# Patient Record
Sex: Male | Born: 1955 | Race: Black or African American | Hispanic: No | Marital: Married | Smoking: Current every day smoker
Health system: Southern US, Community
[De-identification: ages and names within clinical notes are randomized; demographics above are authoritative.]

## PROBLEM LIST (undated history)

## (undated) DIAGNOSIS — B1920 Unspecified viral hepatitis C without hepatic coma: Secondary | ICD-10-CM

## (undated) DIAGNOSIS — T6701XA Heatstroke and sunstroke, initial encounter: Secondary | ICD-10-CM

## (undated) DIAGNOSIS — I639 Cerebral infarction, unspecified: Secondary | ICD-10-CM

## (undated) HISTORY — DX: Unspecified viral hepatitis C without hepatic coma: B19.20

## (undated) HISTORY — PX: OTHER SURGICAL HISTORY: SHX169

---

## 1998-04-13 ENCOUNTER — Emergency Department (HOSPITAL_COMMUNITY): Admission: EM | Admit: 1998-04-13 | Discharge: 1998-04-13 | Payer: Self-pay | Admitting: Emergency Medicine

## 1998-04-13 ENCOUNTER — Encounter: Payer: Self-pay | Admitting: Emergency Medicine

## 1999-01-29 ENCOUNTER — Emergency Department (HOSPITAL_COMMUNITY): Admission: EM | Admit: 1999-01-29 | Discharge: 1999-01-29 | Payer: Self-pay | Admitting: *Deleted

## 2004-08-24 ENCOUNTER — Ambulatory Visit: Payer: Self-pay | Admitting: Family Medicine

## 2006-01-28 ENCOUNTER — Ambulatory Visit: Payer: Self-pay | Admitting: Family Medicine

## 2006-07-13 ENCOUNTER — Ambulatory Visit: Payer: Self-pay | Admitting: Family Medicine

## 2006-07-13 LAB — CONVERTED CEMR LAB
ALT: 68 units/L — ABNORMAL HIGH (ref 0–40)
AST: 62 units/L — ABNORMAL HIGH (ref 0–37)
Albumin: 3.7 g/dL (ref 3.5–5.2)
Alkaline Phosphatase: 74 units/L (ref 39–117)
Bilirubin, Direct: 0.2 mg/dL (ref 0.0–0.3)
HCV Ab: POSITIVE — AB
Hep B Core Total Ab: POSITIVE — AB
Hep B S Ab: NEGATIVE
Hepatitis B Surface Ag: NEGATIVE
Total Bilirubin: 0.8 mg/dL (ref 0.3–1.2)
Total Protein: 8.6 g/dL — ABNORMAL HIGH (ref 6.0–8.3)

## 2006-09-29 ENCOUNTER — Ambulatory Visit: Payer: Self-pay | Admitting: Gastroenterology

## 2007-04-13 ENCOUNTER — Ambulatory Visit: Payer: Self-pay | Admitting: Gastroenterology

## 2007-04-28 ENCOUNTER — Ambulatory Visit: Payer: Self-pay | Admitting: Family Medicine

## 2007-04-28 DIAGNOSIS — Z9189 Other specified personal risk factors, not elsewhere classified: Secondary | ICD-10-CM

## 2007-04-28 DIAGNOSIS — B353 Tinea pedis: Secondary | ICD-10-CM

## 2007-11-12 ENCOUNTER — Telehealth: Payer: Self-pay | Admitting: Internal Medicine

## 2007-11-13 ENCOUNTER — Ambulatory Visit: Payer: Self-pay | Admitting: Internal Medicine

## 2007-11-13 DIAGNOSIS — L03119 Cellulitis of unspecified part of limb: Secondary | ICD-10-CM

## 2007-11-13 DIAGNOSIS — L02619 Cutaneous abscess of unspecified foot: Secondary | ICD-10-CM

## 2007-11-24 ENCOUNTER — Ambulatory Visit: Payer: Self-pay | Admitting: Family Medicine

## 2007-11-30 ENCOUNTER — Telehealth: Payer: Self-pay | Admitting: Internal Medicine

## 2007-12-01 ENCOUNTER — Ambulatory Visit: Payer: Self-pay | Admitting: Family Medicine

## 2007-12-06 ENCOUNTER — Telehealth (INDEPENDENT_AMBULATORY_CARE_PROVIDER_SITE_OTHER): Payer: Self-pay | Admitting: *Deleted

## 2008-07-25 ENCOUNTER — Ambulatory Visit: Payer: Self-pay | Admitting: Family Medicine

## 2008-07-26 ENCOUNTER — Telehealth: Payer: Self-pay | Admitting: Family Medicine

## 2008-07-28 ENCOUNTER — Ambulatory Visit: Payer: Self-pay | Admitting: Internal Medicine

## 2008-08-28 ENCOUNTER — Ambulatory Visit: Payer: Self-pay | Admitting: Gastroenterology

## 2008-08-28 HISTORY — PX: OTHER SURGICAL HISTORY: SHX169

## 2008-08-28 LAB — HM COLONOSCOPY

## 2010-06-16 ENCOUNTER — Encounter: Payer: Self-pay | Admitting: Gastroenterology

## 2010-06-23 LAB — CONVERTED CEMR LAB
ALT: 81 units/L — ABNORMAL HIGH (ref 0–53)
AST: 62 units/L — ABNORMAL HIGH (ref 0–37)
Albumin: 3.9 g/dL (ref 3.5–5.2)
Alkaline Phosphatase: 59 units/L (ref 39–117)
BUN: 15 mg/dL (ref 6–23)
Basophils Absolute: 0.1 10*3/uL (ref 0.0–0.1)
Basophils Relative: 0.8 % (ref 0.0–3.0)
Bilirubin, Direct: 0.2 mg/dL (ref 0.0–0.3)
Blood in Urine, dipstick: NEGATIVE
CO2: 28 meq/L (ref 19–32)
Calcium: 9.3 mg/dL (ref 8.4–10.5)
Chloride: 104 meq/L (ref 96–112)
Cholesterol: 142 mg/dL (ref 0–200)
Creatinine, Ser: 1.2 mg/dL (ref 0.4–1.5)
Eosinophils Absolute: 0.1 10*3/uL (ref 0.0–0.7)
Eosinophils Relative: 1.2 % (ref 0.0–5.0)
GFR calc Af Amer: 82 mL/min
GFR calc non Af Amer: 68 mL/min
Glucose, Bld: 106 mg/dL — ABNORMAL HIGH (ref 70–99)
Glucose, Urine, Semiquant: NEGATIVE
HCT: 41.1 % (ref 39.0–52.0)
HDL: 47.6 mg/dL (ref >39.0)
Hemoglobin: 14.3 g/dL (ref 13.0–17.0)
Ketones, urine, test strip: NEGATIVE
LDL Cholesterol: 59 mg/dL (ref 0–99)
Lymphocytes Relative: 54.4 % — ABNORMAL HIGH (ref 12.0–46.0)
MCHC: 34.9 g/dL (ref 30.0–36.0)
MCV: 100.9 fL — ABNORMAL HIGH (ref 78.0–100.0)
Monocytes Absolute: 0.7 10*3/uL (ref 0.1–1.0)
Monocytes Relative: 7.6 % (ref 3.0–12.0)
Neutro Abs: 3.2 10*3/uL (ref 1.4–7.7)
Neutrophils Relative %: 36 % — ABNORMAL LOW (ref 43.0–77.0)
Nitrite: NEGATIVE
PSA: 0.15 ng/mL (ref 0.10–4.00)
Platelets: 180 10*3/uL (ref 150–400)
Potassium: 4.1 meq/L (ref 3.5–5.1)
RBC: 4.07 M/uL — ABNORMAL LOW (ref 4.22–5.81)
RDW: 11.6 % (ref 11.5–14.6)
Sodium: 139 meq/L (ref 135–145)
Specific Gravity, Urine: 1.025
TSH: 1.86 microintl units/mL (ref 0.35–5.50)
Total Bilirubin: 0.9 mg/dL (ref 0.3–1.2)
Total CHOL/HDL Ratio: 3
Total Protein: 8.6 g/dL — ABNORMAL HIGH (ref 6.0–8.3)
Triglycerides: 176 mg/dL — ABNORMAL HIGH (ref 0–149)
Urobilinogen, UA: 0.2
VLDL: 35 mg/dL (ref 0–40)
WBC Urine, dipstick: NEGATIVE
WBC: 8.8 10*3/uL (ref 4.5–10.5)
pH: 5.5

## 2010-08-12 ENCOUNTER — Encounter: Payer: Self-pay | Admitting: Family Medicine

## 2010-08-13 ENCOUNTER — Encounter: Payer: Self-pay | Admitting: Family Medicine

## 2010-08-27 ENCOUNTER — Other Ambulatory Visit: Payer: Self-pay | Admitting: Family Medicine

## 2010-08-27 ENCOUNTER — Other Ambulatory Visit: Payer: Self-pay

## 2010-08-27 ENCOUNTER — Other Ambulatory Visit (INDEPENDENT_AMBULATORY_CARE_PROVIDER_SITE_OTHER): Payer: Self-pay | Admitting: Family Medicine

## 2010-08-27 DIAGNOSIS — Z0000 Encounter for general adult medical examination without abnormal findings: Secondary | ICD-10-CM

## 2010-08-27 LAB — POCT URINALYSIS DIPSTICK
Leukocytes, UA: NEGATIVE
Nitrite, UA: NEGATIVE
pH, UA: 5

## 2010-08-28 LAB — LIPID PANEL
Cholesterol: 134 mg/dL (ref 0–200)
Total CHOL/HDL Ratio: 3
VLDL: 69.8 mg/dL — ABNORMAL HIGH (ref 0.0–40.0)

## 2010-08-28 LAB — CBC WITH DIFFERENTIAL/PLATELET
Basophils Absolute: 0 10*3/uL (ref 0.0–0.1)
Basophils Relative: 0 % (ref 0–1)
Lymphocytes Relative: 60 % — ABNORMAL HIGH (ref 12–46)
MCHC: 32.3 g/dL (ref 30.0–36.0)
Neutro Abs: 2.9 10*3/uL (ref 1.7–7.7)
Neutrophils Relative %: 30 % — ABNORMAL LOW (ref 43–77)
Platelets: 178 10*3/uL (ref 150–400)
RDW: 13.4 % (ref 11.5–15.5)
WBC: 9.7 10*3/uL (ref 4.0–10.5)

## 2010-08-28 LAB — BASIC METABOLIC PANEL
BUN: 20 mg/dL (ref 6–23)
CO2: 24 mEq/L (ref 19–32)
Calcium: 9.2 mg/dL (ref 8.4–10.5)
Chloride: 106 mEq/L (ref 96–112)
Creatinine, Ser: 1.4 mg/dL (ref 0.4–1.5)
GFR: 70.28 mL/min (ref >60.00)
Glucose, Bld: 99 mg/dL (ref 70–99)
Potassium: 4.6 mEq/L (ref 3.5–5.1)
Sodium: 141 mEq/L (ref 135–145)

## 2010-08-28 LAB — HEPATIC FUNCTION PANEL
ALT: 62 U/L — ABNORMAL HIGH (ref 0–53)
AST: 70 U/L — ABNORMAL HIGH (ref 0–37)
Albumin: 3.9 g/dL (ref 3.5–5.2)
Alkaline Phosphatase: 49 U/L (ref 39–117)
Total Bilirubin: 0.7 mg/dL (ref 0.3–1.2)

## 2010-08-28 LAB — PSA: PSA: 0.11 ng/mL (ref 0.10–4.00)

## 2010-08-28 LAB — LDL CHOLESTEROL, DIRECT: Direct LDL: 44.6 mg/dL

## 2010-08-29 ENCOUNTER — Telehealth: Payer: Self-pay | Admitting: *Deleted

## 2010-08-29 NOTE — Telephone Encounter (Signed)
Pt's wife is calling for lab results.  Husband is concerned.

## 2010-09-02 ENCOUNTER — Telehealth: Payer: Self-pay

## 2010-09-02 ENCOUNTER — Ambulatory Visit (INDEPENDENT_AMBULATORY_CARE_PROVIDER_SITE_OTHER): Payer: Self-pay | Admitting: Family Medicine

## 2010-09-02 ENCOUNTER — Encounter: Payer: Self-pay | Admitting: Family Medicine

## 2010-09-02 VITALS — BP 120/82 | HR 103 | Ht 70.0 in | Wt 193.0 lb

## 2010-09-02 DIAGNOSIS — Z136 Encounter for screening for cardiovascular disorders: Secondary | ICD-10-CM

## 2010-09-02 DIAGNOSIS — Z0000 Encounter for general adult medical examination without abnormal findings: Secondary | ICD-10-CM

## 2010-09-02 NOTE — Progress Notes (Signed)
  Subjective:    Patient ID: Ryan Montgomery, male    DOB: 05-02-1956, 55 y.o.   MRN: [id number]  HPI 54 yr old male for a cpx. He feels fine with no complaints.    Review of Systems  Constitutional: Negative.   HENT: Negative.   Eyes: Negative.   Respiratory: Negative.   Cardiovascular: Negative.   Gastrointestinal: Negative.   Genitourinary: Negative.   Musculoskeletal: Negative.   Skin: Negative.   Neurological: Negative.   Hematological: Negative.   Psychiatric/Behavioral: Negative.        Objective:   Physical Exam  Constitutional: He is oriented to person, place, and time. He appears well-developed and well-nourished. No distress.  HENT:  Head: Normocephalic and atraumatic.  Right Ear: External ear normal.  Left Ear: External ear normal.  Nose: Nose normal.  Mouth/Throat: Oropharynx is clear and moist. No oropharyngeal exudate.  Eyes: Conjunctivae and EOM are normal. Pupils are equal, round, and reactive to light. Right eye exhibits no discharge. Left eye exhibits no discharge. No scleral icterus.  Neck: Neck supple. No JVD present. No tracheal deviation present. No thyromegaly present.  Cardiovascular: Normal rate, regular rhythm, normal heart sounds and intact distal pulses.  Exam reveals no gallop and no friction rub.   No murmur heard. Pulmonary/Chest: Effort normal and breath sounds normal. No respiratory distress. He has no wheezes. He has no rales. He exhibits no tenderness.  Abdominal: Soft. Bowel sounds are normal. He exhibits no distension and no mass. There is no tenderness. There is no rebound and no guarding.  Genitourinary: Rectum normal, prostate normal and penis normal. Guaiac negative stool. No penile tenderness.  Musculoskeletal: Normal range of motion. He exhibits no edema and no tenderness.  Lymphadenopathy:    He has no cervical adenopathy.  Neurological: He is alert and oriented to person, place, and time. He has normal reflexes. No cranial nerve  deficit. He exhibits normal muscle tone. Coordination normal.  Skin: Skin is warm and dry. No rash noted. He is not diaphoretic. No erythema. No pallor.  Psychiatric: He has a normal mood and affect. His behavior is normal. Judgment and thought content normal.          Assessment & Plan:  We discussed reducing the fats in his diet. Suggested he take fish oil capsules daily.

## 2010-09-02 NOTE — Telephone Encounter (Signed)
We went over these today at his cpx

## 2010-09-02 NOTE — Telephone Encounter (Signed)
Pt here for appt with Dr Clent Ridges for his CPX. Copy of labs to pt.

## 2010-09-02 NOTE — Telephone Encounter (Signed)
Message copied by Madison Hickman on Mon Sep 02, 2010 11:12 AM ------      Message from: Dwaine Deter      Created: Thu Aug 29, 2010  1:22 PM       Normal except high TG. Watch the diet

## 2010-10-08 NOTE — Assessment & Plan Note (Signed)
[facility]                                 ON-CALL NOTE   Ryan Montgomery, Ryan Montgomery                      MRN:          [id number]  DATE:11/19/2007                            DOB:          11/16/1955    PRIMARY CARE PHYSICIAN:  Tera Mater. Clent Ridges, MD   OBJECTIVE:  The patient was [facility], was given  Avelox for a presumed cellulitis on his foot between his toes, got  through reading the insert and saw that it can cause rash.  He developed  a mild rash on Tuesday.  He kept taking the medication, and by today now  has, what he thinks, are hives.  PI also said to avoid sunlight.  I  tried to explain to him that he needs to stop his medication at this  point, take antihistamine and drink lots of fluids and the hives will  probably resolve and told him to seek appointment on Monday to follow  up.  I then spoke with the wife and she said that the infection of the  feet looks like it has basically resolved, and therefore, probably not  really important that he needs to be seen real quickly, but I would  still followup just to see whether if it is true that he really did have  a rash to the medication.  Again, I told him to go ahead and take  antihistamine.  He has Zyrtec at home,.  Drink lots of fluids and the  hives should slowly resolve.  Primary care Blakelee Allington is Dr. Clent Ridges and home  office [place].      Arta Silence, MD  Electronically Signed     Arta Silence, MD  Electronically Signed   RNS/MedQ  DD: 11/19/2007  DT: 11/20/2007  Job #: [id number]

## 2010-10-11 NOTE — Assessment & Plan Note (Signed)
[facility] OFFICE NOTE   LAMORRIS, KNOBLOCK                      MRN:          [id number]  DATE:01/28/2006                            DOB:          06-29-1955    This is a 55 year old gentleman here for a complete physical examination.  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  He feels fine  and has no complaints at all.  We have not been following him for any  particular chronic conditions either.  For details of his past medical  history, family history, social history, etcetera refer to the last physical  note dated January 04, 2004.   ALLERGIES:  None.   MEDICATIONS:  None.   OBJECTIVE:  VITAL SIGNS:  Height 6 foot, 0 inches; weight 188, blood  pressure 120/80, pulse 70 and regular.  GENERAL:  He appears to be healthy.  SKIN:  Free of significant lesions.  HEENT:  Eyes:  Clear.  Vision, uncorrected, is 20/20 on the right and 20/20  on the left.  Hearing:  Grossly within normal limits.  Ears are clear.  Oropharynx is  clear.  NECK:  Supple without lymphadenopathy or masses.  LUNGS:  Clear.  CARDIAC:  Rate and rhythm are regular without gallops, murmurs or rubs.  Distal pulses are full.  ABDOMEN:  Soft, normal bowel sounds, nontender, no masses.  GENITALIA:  Normal male.  RECTAL:  No masses or tenderness. Prostate is within normal limits.  Stool  hemoccult negative.  EXTREMITIES:  No clubbing, cyanosis or edema.  NEUROLOGIC:  Grossly intact.   The urinalysis today is clear.   ASSESSMENT AND PLAN:  1. Complete physical.  He is fasting so I will send him for the usual      laboratories.  2. Department of Transportation exam.  He was passed without restrictions      for the next two years.                                   Tera Mater. Clent Ridges, MD   SAF/MedQ  DD:  01/28/2006  DT:  01/29/2006  Job #:  [id number]

## 2011-07-07 ENCOUNTER — Ambulatory Visit: Payer: Self-pay | Admitting: Family Medicine

## 2011-07-08 ENCOUNTER — Encounter: Payer: Self-pay | Admitting: Family Medicine

## 2011-07-08 ENCOUNTER — Ambulatory Visit (INDEPENDENT_AMBULATORY_CARE_PROVIDER_SITE_OTHER): Payer: Self-pay | Admitting: Family Medicine

## 2011-07-08 VITALS — BP 132/82 | HR 94 | Temp 99.2°F | Ht 70.0 in | Wt 190.0 lb

## 2011-07-08 DIAGNOSIS — Z0000 Encounter for general adult medical examination without abnormal findings: Secondary | ICD-10-CM

## 2011-07-08 LAB — CBC WITH DIFFERENTIAL/PLATELET
Basophils Relative: 0.5 % (ref 0.0–3.0)
Eosinophils Relative: 0.7 % (ref 0.0–5.0)
HCT: 39.2 % (ref 39.0–52.0)
Hemoglobin: 13.2 g/dL (ref 13.0–17.0)
Lymphocytes Relative: 60.9 % — ABNORMAL HIGH (ref 12.0–46.0)
Lymphs Abs: 5.3 10*3/uL — ABNORMAL HIGH (ref 0.7–4.0)
Monocytes Relative: 9.3 % (ref 3.0–12.0)
Neutro Abs: 2.5 10*3/uL (ref 1.4–7.7)
RBC: 3.81 Mil/uL — ABNORMAL LOW (ref 4.22–5.81)

## 2011-07-08 LAB — HEPATIC FUNCTION PANEL
ALT: 63 U/L — ABNORMAL HIGH (ref 0–53)
AST: 63 U/L — ABNORMAL HIGH (ref 0–37)
Bilirubin, Direct: 0.1 mg/dL (ref 0.0–0.3)
Total Bilirubin: 0.7 mg/dL (ref 0.3–1.2)

## 2011-07-08 LAB — POCT URINALYSIS DIPSTICK
Bilirubin, UA: NEGATIVE
Ketones, UA: NEGATIVE
Leukocytes, UA: NEGATIVE
Protein, UA: NEGATIVE
Spec Grav, UA: 1.025

## 2011-07-08 LAB — BASIC METABOLIC PANEL
CO2: 27 mEq/L (ref 19–32)
Calcium: 9.3 mg/dL (ref 8.4–10.5)
Chloride: 106 mEq/L (ref 96–112)
Creatinine, Ser: 1.1 mg/dL (ref 0.4–1.5)
Sodium: 139 mEq/L (ref 135–145)

## 2011-07-08 LAB — LIPID PANEL
Cholesterol: 116 mg/dL (ref 0–200)
HDL: 37.1 mg/dL — ABNORMAL LOW (ref >39.00)
Triglycerides: 327 mg/dL — ABNORMAL HIGH (ref 0.0–149.0)

## 2011-07-08 LAB — TSH: TSH: 1.92 u[IU]/mL (ref 0.35–5.50)

## 2011-07-08 LAB — PSA: PSA: 0.15 ng/mL (ref 0.10–4.00)

## 2011-07-08 NOTE — Progress Notes (Signed)
  Subjective:    Patient ID: Ryan Montgomery, male    DOB: [birth year], 56 y.o.   MRN: [id number]  HPI 56 yr old male for a cpx. He feels fine and has no concerns.    Review of Systems  Constitutional: Negative.   HENT: Negative.   Eyes: Negative.   Respiratory: Negative.   Cardiovascular: Negative.   Gastrointestinal: Negative.   Genitourinary: Negative.   Musculoskeletal: Negative.   Skin: Negative.   Neurological: Negative.   Hematological: Negative.   Psychiatric/Behavioral: Negative.        Objective:   Physical Exam  Constitutional: He is oriented to person, place, and time. He appears well-developed and well-nourished. No distress.  HENT:  Head: Normocephalic and atraumatic.  Right Ear: External ear normal.  Left Ear: External ear normal.  Nose: Nose normal.  Mouth/Throat: Oropharynx is clear and moist. No oropharyngeal exudate.  Eyes: Conjunctivae and EOM are normal. Pupils are equal, round, and reactive to light. Right eye exhibits no discharge. Left eye exhibits no discharge. No scleral icterus.  Neck: Neck supple. No JVD present. No tracheal deviation present. No thyromegaly present.  Cardiovascular: Normal rate, regular rhythm, normal heart sounds and intact distal pulses.  Exam reveals no gallop and no friction rub.   No murmur heard.      EKG normal   Pulmonary/Chest: Effort normal and breath sounds normal. No respiratory distress. He has no wheezes. He has no rales. He exhibits no tenderness.  Abdominal: Soft. Bowel sounds are normal. He exhibits no distension and no mass. There is no tenderness. There is no rebound and no guarding.  Genitourinary: Rectum normal, prostate normal and penis normal. Guaiac negative stool. No penile tenderness.  Musculoskeletal: Normal range of motion. He exhibits no edema and no tenderness.  Lymphadenopathy:    He has no cervical adenopathy.  Neurological: He is alert and oriented to person, place, and time. He has normal reflexes.  No cranial nerve deficit. He exhibits normal muscle tone. Coordination normal.  Skin: Skin is warm and dry. No rash noted. He is not diaphoretic. No erythema. No pallor.  Psychiatric: He has a normal mood and affect. His behavior is normal. Judgment and thought content normal.          Assessment & Plan:  Well exam. Get fasting labs today

## 2011-07-09 ENCOUNTER — Encounter: Payer: Self-pay | Admitting: Family Medicine

## 2011-07-09 NOTE — Progress Notes (Signed)
Quick Note:  Left voice message and put a copy of results in mail. ______ 

## 2012-06-04 ENCOUNTER — Ambulatory Visit: Payer: Self-pay | Admitting: Emergency Medicine

## 2012-06-04 VITALS — BP 130/80 | HR 90 | Temp 98.0°F | Resp 16 | Ht 71.0 in | Wt 180.8 lb

## 2012-06-04 DIAGNOSIS — Z0000 Encounter for general adult medical examination without abnormal findings: Secondary | ICD-10-CM

## 2012-06-04 NOTE — Progress Notes (Signed)
  Subjective:    Patient ID: Ryan Montgomery, male    DOB: 1955/12/12, 57 y.o.   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 He did check the box regular alcohol use and states he drinks 2 beers per night during the week and 2 mixed drinks at night on the weekends. He has a DUI back in the 80s but none since that time and has never been in a program for alcohol misuse.    Review of Systems     Objective:   Physical Exam HEENT exam is unremarkable neck supple chest clear heart regular leg no murmurs rubs or gallops. Abdomen is soft no tenderness extremities without edema genitourinary reveals no abnormalities       Assessment & Plan:  Patient has regular checkups with his PCP Dr. Clent Ridges. According to the record he has annual exam in the summer was not diagnosed with any alcohol-related issues. He is issued a 2 year card.

## 2012-06-17 ENCOUNTER — Ambulatory Visit (INDEPENDENT_AMBULATORY_CARE_PROVIDER_SITE_OTHER): Payer: Self-pay | Admitting: Family Medicine

## 2012-06-17 ENCOUNTER — Encounter: Payer: Self-pay | Admitting: Family Medicine

## 2012-06-17 VITALS — BP 120/70 | HR 84 | Temp 99.9°F | Wt 191.0 lb

## 2012-06-17 DIAGNOSIS — J069 Acute upper respiratory infection, unspecified: Secondary | ICD-10-CM

## 2012-06-17 MED ORDER — HYDROCODONE-HOMATROPINE 5-1.5 MG/5ML PO SYRP: 5.0000 mL | ORAL_SOLUTION | Freq: Four times a day (QID) | ORAL | Status: AC | PRN

## 2012-06-17 NOTE — Progress Notes (Signed)
  Subjective:    Patient ID: Ryan Montgomery, male    DOB: 1955-10-12, 57 y.o.   MRN: [id number]  HPI Acute visit Onset yesterday of fever, chills, cough, aches,sore throat, headache. No nausea or vomiting.  Used Mucinex and Triaminic-without much relief. No skin rash. No sick contacts. No chronic illness. Takes no regular medications. Allergy to moxifloxacin Does smoke about one pack cigarettes per day   Review of Systems  Constitutional: Positive for fever, chills and fatigue.  HENT: Positive for congestion and sore throat.   Respiratory: Positive for cough.   Cardiovascular: Negative for chest pain.  Neurological: Positive for headaches.       Objective:   Physical Exam  Constitutional: He appears well-developed and well-nourished.  HENT:  Right Ear: External ear normal.  Left Ear: External ear normal.  Mouth/Throat: Oropharynx is clear and moist.  Neck: Neck supple.  Cardiovascular: Normal rate and regular rhythm.   Pulmonary/Chest: Effort normal and breath sounds normal. No respiratory distress. He has no wheezes. He has no rales.  Lymphadenopathy:    He has no cervical adenopathy.          Assessment & Plan:  Viral URI with cough. Hycodan cough syrup for nighttime use as needed. Followup when necessary

## 2012-06-17 NOTE — Patient Instructions (Addendum)
Viral Infections  A viral infection can be caused by different types of viruses.Most viral infections are not serious and resolve on their own. However, some infections may cause severe symptoms and may lead to further complications.  SYMPTOMS  Viruses can frequently cause:   Minor sore throat.   Aches and pains.   Headaches.   Runny nose.   Different types of rashes.   Watery eyes.   Tiredness.   Cough.   Loss of appetite.   Gastrointestinal infections, resulting in nausea, vomiting, and diarrhea.  These symptoms do not respond to antibiotics because the infection is not caused by bacteria. However, you might catch a bacterial infection following the viral infection. This is sometimes called a "superinfection." Symptoms of such a bacterial infection may include:   Worsening sore throat with pus and difficulty swallowing.   Swollen neck glands.   Chills and a high or persistent fever.   Severe headache.   Tenderness over the sinuses.   Persistent overall ill feeling (malaise), muscle aches, and tiredness (fatigue).   Persistent cough.   Yellow, green, or brown mucus production with coughing.  HOME CARE INSTRUCTIONS    Only take over-the-counter or prescription medicines for pain, discomfort, diarrhea, or fever as directed by your caregiver.   Drink enough water and fluids to keep your urine clear or pale yellow. Sports drinks can provide valuable electrolytes, sugars, and hydration.   Get plenty of rest and maintain proper nutrition. Soups and broths with crackers or rice are fine.  SEEK IMMEDIATE MEDICAL CARE IF:    You have severe headaches, shortness of breath, chest pain, neck pain, or an unusual rash.   You have uncontrolled vomiting, diarrhea, or you are unable to keep down fluids.   You or your child has an oral temperature above 102 F (38.9 C), not controlled by medicine.   Your baby is older than 3 months with a rectal temperature of 102 F (38.9 C) or higher.   Your baby is 3  months old or younger with a rectal temperature of 100.4 F (38 C) or higher.  MAKE SURE YOU:    Understand these instructions.   Will watch your condition.   Will get help right away if you are not doing well or get worse.  Document Released: 02/19/2005 Document Revised: 08/04/2011 Document Reviewed: 09/16/2010  ExitCare Patient Information 2013 [employer].

## 2012-10-09 ENCOUNTER — Encounter: Payer: Self-pay | Admitting: Emergency Medicine

## 2012-12-23 ENCOUNTER — Other Ambulatory Visit: Payer: Self-pay

## 2012-12-30 ENCOUNTER — Encounter: Payer: Self-pay | Admitting: Family Medicine

## 2013-01-27 ENCOUNTER — Other Ambulatory Visit: Payer: Self-pay

## 2013-01-31 ENCOUNTER — Encounter: Payer: Self-pay | Admitting: Family Medicine

## 2013-07-11 ENCOUNTER — Other Ambulatory Visit: Payer: Self-pay

## 2013-07-18 ENCOUNTER — Encounter: Payer: Self-pay | Admitting: Family Medicine

## 2013-07-18 ENCOUNTER — Ambulatory Visit (INDEPENDENT_AMBULATORY_CARE_PROVIDER_SITE_OTHER): Payer: No Typology Code available for payment source | Admitting: Family Medicine

## 2013-07-18 VITALS — BP 140/82 | HR 101 | Temp 98.5°F | Ht 70.25 in | Wt 180.0 lb

## 2013-07-18 DIAGNOSIS — Z0000 Encounter for general adult medical examination without abnormal findings: Secondary | ICD-10-CM

## 2013-07-18 DIAGNOSIS — B1920 Unspecified viral hepatitis C without hepatic coma: Secondary | ICD-10-CM | POA: Insufficient documentation

## 2013-07-18 LAB — LIPID PANEL
Cholesterol: 120 mg/dL (ref 0–200)
HDL: 47.7 mg/dL (ref >39.00)
LDL Cholesterol: 40 mg/dL (ref 0–99)
TRIGLYCERIDES: 163 mg/dL — AB (ref 0.0–149.0)
Total CHOL/HDL Ratio: 3
VLDL: 32.6 mg/dL (ref 0.0–40.0)

## 2013-07-18 LAB — CBC WITH DIFFERENTIAL/PLATELET
Basophils Absolute: 0 10*3/uL (ref 0.0–0.1)
Basophils Relative: 0.6 % (ref 0.0–3.0)
Eosinophils Absolute: 0.1 10*3/uL (ref 0.0–0.7)
Eosinophils Relative: 0.7 % (ref 0.0–5.0)
HCT: 37.8 % — ABNORMAL LOW (ref 39.0–52.0)
Hemoglobin: 12.3 g/dL — ABNORMAL LOW (ref 13.0–17.0)
Lymphocytes Relative: 51.6 % — ABNORMAL HIGH (ref 12.0–46.0)
Lymphs Abs: 4.4 10*3/uL — ABNORMAL HIGH (ref 0.7–4.0)
MCHC: 32.5 g/dL (ref 30.0–36.0)
MCV: 107.2 fl — ABNORMAL HIGH (ref 78.0–100.0)
MONO ABS: 0.7 10*3/uL (ref 0.1–1.0)
Monocytes Relative: 8.2 % (ref 3.0–12.0)
Neutro Abs: 3.3 10*3/uL (ref 1.4–7.7)
Neutrophils Relative %: 38.9 % — ABNORMAL LOW (ref 43.0–77.0)
PLATELETS: 140 10*3/uL — AB (ref 150.0–400.0)
RBC: 3.52 Mil/uL — ABNORMAL LOW (ref 4.22–5.81)
RDW: 14.2 % (ref 11.5–14.6)
WBC: 8.6 10*3/uL (ref 4.5–10.5)

## 2013-07-18 LAB — HEPATIC FUNCTION PANEL
ALT: 106 U/L — AB (ref 0–53)
AST: 169 U/L — ABNORMAL HIGH (ref 0–37)
Albumin: 3.4 g/dL — ABNORMAL LOW (ref 3.5–5.2)
Alkaline Phosphatase: 53 U/L (ref 39–117)
BILIRUBIN DIRECT: 0.1 mg/dL (ref 0.0–0.3)
Total Bilirubin: 0.7 mg/dL (ref 0.3–1.2)
Total Protein: 8.3 g/dL (ref 6.0–8.3)

## 2013-07-18 LAB — BASIC METABOLIC PANEL
BUN: 11 mg/dL (ref 6–23)
CO2: 27 mEq/L (ref 19–32)
Calcium: 9.4 mg/dL (ref 8.4–10.5)
Chloride: 109 mEq/L (ref 96–112)
Creatinine, Ser: 1 mg/dL (ref 0.4–1.5)
GFR: 100.31 mL/min (ref >60.00)
Glucose, Bld: 102 mg/dL — ABNORMAL HIGH (ref 70–99)
Potassium: 4.9 mEq/L (ref 3.5–5.1)
SODIUM: 143 meq/L (ref 135–145)

## 2013-07-18 LAB — POCT URINALYSIS DIPSTICK
Bilirubin, UA: NEGATIVE
Glucose, UA: NEGATIVE
Ketones, UA: NEGATIVE
Leukocytes, UA: NEGATIVE
Nitrite, UA: NEGATIVE
RBC UA: NEGATIVE
Spec Grav, UA: 1.025
UROBILINOGEN UA: 0.2
pH, UA: 5.5

## 2013-07-18 MED ORDER — DICLOFENAC SODIUM 75 MG PO TBEC: 75.0000 mg | DELAYED_RELEASE_TABLET | Freq: Two times a day (BID) | ORAL | Status: DC | PRN

## 2013-07-18 NOTE — Progress Notes (Signed)
   Subjective:    Patient ID: Ryan NewcomerMaurice Macdonell, male    DOB: 07/25/1955, 58 y.o.   MRN: [id number]  HPI 58 yr old male for a cpx. He feels well except for some pain in the right wrist that started one year ago. This bothers him when he has to do heavy lifting or pushing heavy equipment on his job. He wears a wrist brace at times and this helps. He has not [facility] years now.    Review of Systems  Constitutional: Negative.   HENT: Negative.   Eyes: Negative.   Respiratory: Negative.   Cardiovascular: Negative.   Gastrointestinal: Negative.   Genitourinary: Negative.   Musculoskeletal: Negative.   Skin: Negative.   Neurological: Negative.   Psychiatric/Behavioral: Negative.        Objective:   Physical Exam  Constitutional: He is oriented to person, place, and time. He appears well-developed and well-nourished. No distress.  HENT:  Head: Normocephalic and atraumatic.  Right Ear: External ear normal.  Left Ear: External ear normal.  Nose: Nose normal.  Mouth/Throat: Oropharynx is clear and moist. No oropharyngeal exudate.  Eyes: Conjunctivae and EOM are normal. Pupils are equal, round, and reactive to light. Right eye exhibits no discharge. Left eye exhibits no discharge. No scleral icterus.  Neck: Neck supple. No JVD present. No tracheal deviation present. No thyromegaly present.  Cardiovascular: Normal rate, regular rhythm, normal heart sounds and intact distal pulses.  Exam reveals no gallop and no friction rub.   No murmur heard. EKG normal   Pulmonary/Chest: Effort normal and breath sounds normal. No respiratory distress. He has no wheezes. He has no rales. He exhibits no tenderness.  Abdominal: Soft. Bowel sounds are normal. He exhibits no distension and no mass. There is no tenderness. There is no rebound and no guarding.  Genitourinary: Rectum normal, prostate normal and penis normal. Guaiac negative stool. No penile tenderness.    Musculoskeletal: Normal range of motion. He exhibits no edema and no tenderness.  Lymphadenopathy:    He has no cervical adenopathy.  Neurological: He is alert and oriented to person, place, and time. He has normal reflexes. No cranial nerve deficit. He exhibits normal muscle tone. Coordination normal.  Skin: Skin is warm and dry. No rash noted. He is not diaphoretic. No erythema. No pallor.  Psychiatric: He has a normal mood and affect. His behavior is normal. Judgment and thought content normal.          Assessment & Plan:  Well exam. Get fasting labs. He seems to have some tendonitis in the wrist, so he wil try Diclofenac prn. Refer to [facility] for the hepatitis C.

## 2013-07-18 NOTE — Progress Notes (Signed)
Pre visit review using our clinic review tool, if applicable. No additional management support is needed unless otherwise documented below in the visit note. 

## 2013-07-19 ENCOUNTER — Telehealth: Payer: Self-pay | Admitting: Family Medicine

## 2013-07-19 LAB — PSA: PSA: 0.19 ng/mL (ref 0.10–4.00)

## 2013-07-19 LAB — TSH: TSH: 2.86 u[IU]/mL (ref 0.35–5.50)

## 2013-07-19 NOTE — Telephone Encounter (Signed)
Relevant patient education mailed to patient.  

## 2013-08-01 ENCOUNTER — Telehealth: Payer: Self-pay | Admitting: Family Medicine

## 2013-08-01 NOTE — Telephone Encounter (Signed)
 GI gave name of md at [facility] that will see pt  [phone] Fax  [phone] Pt would like to get this going asap since it's been 2 weeks

## 2013-08-02 NOTE — Telephone Encounter (Addendum)
Pt aware this referral will be sent to [facility]. Ryan Montgomery will not do  either

## 2013-08-05 NOTE — Telephone Encounter (Signed)
pls advise on referral to [facility]! Pt following up b/c he needs appt asap.

## 2013-08-12 ENCOUNTER — Telehealth: Payer: Self-pay | Admitting: Family Medicine

## 2013-08-12 NOTE — Telephone Encounter (Signed)
No these specialty orders need to come from the GI office, not us

## 2013-08-12 NOTE — Telephone Encounter (Signed)
Pt hus has been referral to [facility] for hep C. [facility] is requesting genotype ,hcv rna quantitive , Can we sch lab?

## 2013-08-17 NOTE — Telephone Encounter (Signed)
I spoke with pt's wife. 

## 2013-08-22 ENCOUNTER — Ambulatory Visit: Payer: Self-pay | Admitting: Family Medicine

## 2013-08-22 ENCOUNTER — Telehealth: Payer: Self-pay | Admitting: Family Medicine

## 2013-08-22 VITALS — BP 144/84 | HR 104 | Temp 97.9°F | Resp 16 | Ht 70.0 in | Wt 181.6 lb

## 2013-08-22 DIAGNOSIS — Z0289 Encounter for other administrative examinations: Secondary | ICD-10-CM

## 2013-08-22 DIAGNOSIS — Z0000 Encounter for general adult medical examination without abnormal findings: Secondary | ICD-10-CM

## 2013-08-22 DIAGNOSIS — B182 Chronic viral hepatitis C: Secondary | ICD-10-CM

## 2013-08-22 NOTE — Progress Notes (Signed)
Patient ID: Ryan Montgomery MRN: [id number], DOB: 02-15-1956 58 y.o. Date of Encounter: 08/22/2013, 11:10 AM  Primary Physician: Nelwyn Salisbury, MD  Chief Complaint: Physical (CPE)  HPI: 58 y.o. y/o male with history noted below here for CPE.  Doing well. No issues/complaints.  Review of Systems: Consitutional: No fever, chills, fatigue, night sweats, lymphadenopathy, or weight changes. Eyes: No visual changes, eye redness, or discharge. ENT/Mouth: Ears: No otalgia, tinnitus, hearing loss, discharge. Nose: No congestion, rhinorrhea, sinus pain, or epistaxis. Throat: No sore throat, post nasal drip, or teeth pain. Cardiovascular: No CP, palpitations, diaphoresis, DOE, edema, orthopnea, PND. Respiratory: No cough, hemoptysis, SOB, or wheezing. Gastrointestinal: No anorexia, dysphagia, reflux, pain, nausea, vomiting, hematemesis, diarrhea, constipation, BRBPR, or melena. Genitourinary: No dysuria, frequency, urgency, hematuria, incontinence, nocturia, decreased urinary stream, discharge, impotence, or testicular pain/masses. Musculoskeletal: No decreased ROM, myalgias, stiffness, joint swelling, or weakness. Skin: No rash, erythema, lesion changes, pain, warmth, jaundice, or pruritis. Neurological: No headache, dizziness, syncope, seizures, tremors, memory loss, coordination problems, or paresthesias. Psychological: No anxiety, depression, hallucinations, SI/HI. Endocrine: No fatigue, polydipsia, polyphagia, polyuria, or known diabetes. All other systems were reviewed and are otherwise negative.  Past Medical History  Diagnosis Date  . Chickenpox   . Chickenpox   . Hepatitis C      Past Surgical History  Procedure Laterality Date  . Fx rt ankle      pins placed  . Colonoscopy  08-28-08    per Dr. Jarold Motto, clear, repeat in 10 yrs     Home Meds:  Prior to Admission medications   Medication Sig Start Date End Date Taking? Authorizing Provider  diclofenac (VOLTAREN) 75 MG EC  tablet Take 1 tablet (75 mg total) by mouth 2 (two) times daily as needed for moderate pain. 07/18/13   Nelwyn Salisbury, MD    Allergies:  Allergies  Allergen Reactions  . Moxifloxacin     REACTION: hives    History   Social History  . Marital Status: Married    Spouse Name: N/A    Number of Children: N/A  . Years of Education: N/A   Occupational History  . Not on file.   Social History Main Topics  . Smoking status: Current Every Day Smoker -- 1.00 packs/day    Types: Cigarettes  . Smokeless tobacco: Never Used  . Alcohol Use: 0.0 oz/week     Comment: mixed drink daily  . Drug Use: No  . Sexual Activity: Not on file   Other Topics Concern  . Not on file   Social History Narrative  . No narrative on file    Family History  Problem Relation Age of Onset  . Hypertension    . Lung cancer      Physical Exam:  BP recheck 136/80 Blood pressure 144/84, pulse 104, temperature 97.9 F (36.6 C), temperature source Oral, resp. rate 16, height 5\' 10"  (1.778 m), weight 181 lb 9.6 oz (82.373 kg), SpO2 98.00%.  General: Well developed, well nourished, in no acute distress. HEENT: Normocephalic, atraumatic. Conjunctiva pink, sclera non-icteric. Pupils 2 mm constricting to 1 mm, round, regular, and equally reactive to light and accomodation. EOMI. Internal auditory canal clear. TMs with good cone of light and without pathology. Nasal mucosa pink. Nares are without discharge. No sinus tenderness. Oral mucosa pink. Dentition good. Pharynx without exudate.   Neck: Supple. Trachea midline. No thyromegaly. Full ROM. No lymphadenopathy. Lungs: Clear to auscultation bilaterally without wheezes, rales, or rhonchi. Breathing is of normal effort and  unlabored. Cardiovascular: RRR with S1 S2. No murmurs, rubs, or gallops appreciated. Distal pulses 2+ symmetrically. No carotid or abdominal bruits Abdomen: Soft, non-tender, non-distended with normoactive bowel sounds. No hepatosplenomegaly or  masses. No rebound/guarding. No CVA tenderness. Without hernias.   Genitourinary:  circumcised male. No penile lesions. Testes descended bilaterally, and smooth without tenderness or masses.  Musculoskeletal: Full range of motion and 5/5 strength throughout. Without swelling, atrophy, tenderness, crepitus, or warmth. Extremities without clubbing, cyanosis, or edema. Calves supple. Skin: Warm and moist without erythema, ecchymosis, wounds, or rash. Neuro: A+Ox3. CN II-XII grossly intact. Moves all extremities spontaneously. Full sensation throughout. Normal gait. DTR 2+ throughout upper and lower extremities. Finger to nose intact. Psych:  Responds to questions appropriately with a normal affect.    Assessment/Plan:  58 y.o. 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

## 2013-08-22 NOTE — Telephone Encounter (Signed)
Pt's spouse called stating [facility] and was advised that if Dr. Clent RidgesFry orders the  genotype ,hcv rna quantitative labs, labs they will draw them.

## 2013-08-23 ENCOUNTER — Telehealth: Payer: Self-pay | Admitting: Family Medicine

## 2013-08-23 NOTE — Telephone Encounter (Signed)
Relevant patient education mailed to patient.  

## 2013-08-23 NOTE — Telephone Encounter (Signed)
I spoke with spouse, pt does not have a GI doctor here anymore, Dr. Jarold MottoPatterson has retired. She spoke [facility] and they said that these lab orders had to come from PCP.

## 2013-08-24 ENCOUNTER — Ambulatory Visit: Payer: Self-pay

## 2013-08-24 NOTE — Telephone Encounter (Signed)
NO I will not order labs that I know nothing about and will not be interpreting. The specialists need to order their own labs.

## 2013-08-24 NOTE — Telephone Encounter (Signed)
I spoke with wife Gunnar Fusiaula, can you call her to schedule the lab appointment for pt?

## 2013-08-24 NOTE — Telephone Encounter (Signed)
[facility] has refused to see pt until they have those lab test ordered and done. .Wife has called back to state that if dr fry could write him a slip to go to [facility]. Then [facility] will draw the labs and then they will see the patient.  [facility] states they do this all the time for [facility] pts- have labs done prior to appt. Wife states they don't know what else they can do. Pt needs to be seen.

## 2013-08-24 NOTE — Telephone Encounter (Signed)
I spoke with pt's wife and she does understand that we cannot order labs.

## 2013-08-24 NOTE — Telephone Encounter (Signed)
I put these orders in so [facility] can come [facility] to be drawn

## 2013-08-24 NOTE — Telephone Encounter (Signed)
Pt will cb tomorrow to sch labs

## 2013-08-25 ENCOUNTER — Other Ambulatory Visit (INDEPENDENT_AMBULATORY_CARE_PROVIDER_SITE_OTHER): Payer: No Typology Code available for payment source

## 2013-08-25 DIAGNOSIS — B182 Chronic viral hepatitis C: Secondary | ICD-10-CM

## 2013-08-29 LAB — HEPATITIS C RNA QUANTITATIVE
HCV Quantitative Log: 7.1 {Log} — ABNORMAL HIGH (ref <1.18)
HCV Quantitative: 12632193 IU/mL — ABNORMAL HIGH (ref <15)

## 2013-08-30 LAB — HEPATITIS C GENOTYPE

## 2013-09-14 ENCOUNTER — Telehealth: Payer: Self-pay | Admitting: Family Medicine

## 2013-09-14 NOTE — Telephone Encounter (Signed)
Pt wife called and wanted to know if pt lab work has been sent to [facility] she would like call back [phone]

## 2016-05-26 DIAGNOSIS — Z8673 Personal history of transient ischemic attack (TIA), and cerebral infarction without residual deficits: Secondary | ICD-10-CM

## 2016-11-25 ENCOUNTER — Ambulatory Visit: Payer: No Typology Code available for payment source | Admitting: Family Medicine

## 2018-02-03 ENCOUNTER — Encounter: Payer: Self-pay | Admitting: Family Medicine

## 2018-02-03 ENCOUNTER — Other Ambulatory Visit: Payer: Self-pay

## 2018-02-03 ENCOUNTER — Ambulatory Visit: Payer: Self-pay | Admitting: Family Medicine

## 2018-02-03 VITALS — BP 128/88 | HR 108 | Temp 98.2°F | Ht 71.0 in | Wt 175.4 lb

## 2018-02-03 DIAGNOSIS — Z024 Encounter for examination for driving license: Secondary | ICD-10-CM

## 2018-02-03 NOTE — Patient Instructions (Addendum)
   DOT card was completed today, but please follow-up with your primary care provider as soon as possible to repeat urine testing to evaluate the protein that was found in your urine.  Additionally I would recommend to continue to cut back on alcohol use and discuss that further with your primary care provider, especially with history of hepatitis.   If you have difficulty cutting back on alcohol, please discuss options with your primary care provider, or I can provide resources for you as well.   If you have lab work done today you will be contacted with your lab results within the next 2 weeks.  If you have not heard from [place] then please contact us. The fastest way to get your results is to register for My Chart.   IF you received an x-ray today, you will receive an invoice from [facility]. Please contact [facility] at [phone] with questions or concerns regarding your invoice.   IF you received labwork today, you will receive an invoice from [facility]. Please contact [facility] at [phone] with questions or concerns regarding your invoice.   Our billing staff will not be able to assist you with questions regarding bills from these companies.  You will be contacted with the lab results as soon as they are available. The fastest way to get your results is to activate your My Chart account. Instructions are located on the last page of this paperwork. If you have not heard from [place] regarding the results in 2 weeks, please contact this office.

## 2018-02-03 NOTE — Progress Notes (Signed)
Subjective:  By signing my name below, I, Ryan Montgomery, attest that this documentation has been prepared under the direction and in the presence of Ryan Flood, MD Electronically Signed: Charline Bills, ED Scribe 02/03/2018 at 11:43 AM.   Patient ID: Ryan Montgomery, male    DOB: 1956-03-30, 62 y.o.   MRN: [id number]  Chief Complaint  Patient presents with  . DOT   HPI Ryan Montgomery is a 62 y.o. male who presents to Primary Care at [facility] for a DOT physical. H/o tobacco abuse, chronic Hep C initially discussed treatment at [facility]; but has been monitored by Nelwyn Salisbury, MD with last OV in 2015. Pt states he never had treatment for Hep C he just cut back on alcoholic beverages. He is having liver test checked at [place]. Prev notes reviewed with prior alcohol use. Denies prev restrictions on prev 2 yr DOT card. Denies chronic med probs. Pt smokes 1 ppd. Denies coughing fits with/without lightheadedness or dizziness, sob with walking, h/o COPD or emphysema. He averages ~4 beers/night and 1 mixed drink/night but states that he skips days. Denies prev alcohol treatment programs. Last DUI 1989. Denies issues with work due to alcohol or intoxication which working, addiction to alcohol. Denies difficulty urinating, kidney issues. Neg CAGE screening.  R Hand Injury Pt reports R hand injury in early 1980s. He has full use of all extremities. No residual deficits, numbness, weakness.  Patient Active Problem List   Diagnosis Date Noted  . Hepatitis C 07/18/2013  . CELLULITIS, FOOT 11/13/2007  . TINEA PEDIS 04/28/2007  . CHICKENPOX, HX OF 04/28/2007   Past Medical History:  Diagnosis Date  . Chickenpox   . Chickenpox   . Hepatitis C    Past Surgical History:  Procedure Laterality Date  . colonoscopy  08-28-08   per Dr. Jarold Motto, clear, repeat in 10 yrs   . fx rt ankle     pins placed   Allergies  Allergen Reactions  . Moxifloxacin     REACTION: hives   Prior to Admission medications     Medication Sig Start Date End Date Taking? Authorizing Provider  diclofenac (VOLTAREN) 75 MG EC tablet Take 1 tablet (75 mg total) by mouth 2 (two) times daily as needed for moderate pain. 07/18/13   Nelwyn Salisbury, MD   Social History   Socioeconomic History  . Marital status: Married    Spouse name: Not on file  . Number of children: Not on file  . Years of education: Not on file  . Highest education level: Not on file  Occupational History  . Not on file  Social Needs  . Financial resource strain: Not on file  . Food insecurity:    Worry: Not on file    Inability: Not on file  . Transportation needs:    Medical: Not on file    Non-medical: Not on file  Tobacco Use  . Smoking status: Current Every Day Smoker    Packs/day: 1.00    Types: Cigarettes  . Smokeless tobacco: Never Used  Substance and Sexual Activity  . Alcohol use: Yes    Comment: mixed drink daily  . Drug use: No  . Sexual activity: Not on file  Lifestyle  . Physical activity:    Days per week: Not on file    Minutes per session: Not on file  . Stress: Not on file  Relationships  . Social connections:    Talks on phone: Not on file  Gets together: Not on file    Attends religious service: Not on file    Active member of club or organization: Not on file    Attends meetings of clubs or organizations: Not on file    Relationship status: Not on file  . Intimate partner violence:    Fear of current or ex partner: Not on file    Emotionally abused: Not on file    Physically abused: Not on file    Forced sexual activity: Not on file  Other Topics Concern  . Not on file  Social History Narrative  . Not on file   Review of Systems  Genitourinary: Negative for difficulty urinating.  Neurological: Negative for weakness and numbness.      Objective:   Physical Exam  Constitutional: He is oriented to person, place, and time. He appears well-developed and well-nourished.  HENT:  Head: Normocephalic  and atraumatic.  Right Ear: External ear normal.  Left Ear: External ear normal.  Mouth/Throat: Oropharynx is clear and moist.  Eyes: Pupils are equal, round, and reactive to light. Conjunctivae and EOM are normal.  Neck: Normal range of motion. Neck supple. No thyromegaly present.  Cardiovascular: Normal rate, regular rhythm, normal heart sounds and intact distal pulses.  Pulmonary/Chest: Effort normal and breath sounds normal. No respiratory distress. He has no wheezes.  Abdominal: Soft. He exhibits no distension. There is no tenderness. Hernia confirmed negative in the right inguinal area and confirmed negative in the left inguinal area.  Musculoskeletal: Normal range of motion. He exhibits no edema or tenderness.  Lymphadenopathy:    He has no cervical adenopathy.  Neurological: He is alert and oriented to person, place, and time. He has normal reflexes.  Skin: Skin is warm and dry.  Psychiatric: He has a normal mood and affect. His behavior is normal.  Vitals reviewed.    Vitals:   02/03/18 1115 02/03/18 1117  BP: (!) 152/94 128/88  Pulse: (!) 108   Temp: 98.2 F (36.8 C)   TempSrc: Oral   SpO2: 93%   Weight: 175 lb 6.4 oz (79.6 kg)   Height: 5\' 11"  (1.803 m)     Visual Acuity Screening   Right eye Left eye Both eyes  Without correction: 20/20-1 20/50 20/20-1  With correction:     Comments: Peripheral Vision: Right eye 85 degrees. Left eye 85 degrees.  The patient can distinguish the colors red, amber and green.  Hearing Screening Comments: The patient was able to hear a forced whisper from 9 feet. Pt does not wear contacts or glasses (only reading glasses). No hearing aids.    Assessment & Plan:  [name] is a 62 y.o. male Encounter for commercial driver medical examination (CDME)  -Proteinuria noted on urinalysis, advised to follow-up with primary care provider.  -Recommend to continue to cut back on alcohol, especially with history of chronic hepatitis.   Denies problem drinking, negative CAGE screening, and no concerns on exam.  2-year card provided.  No orders of the defined types were placed in this encounter.  Patient Instructions     DOT card was completed today, but please follow-up with your primary care provider as soon as possible to repeat urine testing to evaluate the protein that was found in your urine.  Additionally I would recommend to continue to cut back on alcohol use and discuss that further with your primary care provider, especially with history of hepatitis.   If you have difficulty cutting back on alcohol, please  discuss options with your primary care provider, or I can provide resources for you as well.   If you have lab work done today you will be contacted with your lab results within the next 2 weeks.  If you have not heard from [place] then please contact us. The fastest way to get your results is to register for My Chart.   IF you received an x-ray today, you will receive an invoice from [facility]. Please contact [facility] at [phone] with questions or concerns regarding your invoice.   IF you received labwork today, you will receive an invoice from [facility]. Please contact [facility] at [phone] with questions or concerns regarding your invoice.   Our billing staff will not be able to assist you with questions regarding bills from these companies.  You will be contacted with the lab results as soon as they are available. The fastest way to get your results is to activate your My Chart account. Instructions are located on the last page of this paperwork. If you have not heard from [place] regarding the results in 2 weeks, please contact this office.       I personally performed the services described in this documentation, which was scribed in my presence. The recorded information has been reviewed and considered for accuracy and completeness, addended by me as needed, and agree with  information above.  Signed,   Meredith Staggers, MD Primary Care at [facility].  02/04/18 10:12 PM

## 2018-06-05 ENCOUNTER — Encounter (HOSPITAL_COMMUNITY): Payer: Self-pay

## 2018-06-05 ENCOUNTER — Emergency Department (HOSPITAL_COMMUNITY)
Admission: EM | Admit: 2018-06-05 | Discharge: 2018-06-05 | Disposition: A | Discharge status: Home / Self Care | Payer: Non-veteran care | Attending: Emergency Medicine | Admitting: Emergency Medicine

## 2018-06-05 ENCOUNTER — Other Ambulatory Visit: Payer: Self-pay

## 2018-06-05 DIAGNOSIS — F17210 Nicotine dependence, cigarettes, uncomplicated: Secondary | ICD-10-CM | POA: Insufficient documentation

## 2018-06-05 DIAGNOSIS — F10929 Alcohol use, unspecified with intoxication, unspecified: Secondary | ICD-10-CM | POA: Diagnosis present

## 2018-06-05 DIAGNOSIS — F1010 Alcohol abuse, uncomplicated: Secondary | ICD-10-CM | POA: Diagnosis not present

## 2018-06-05 DIAGNOSIS — Y908 Blood alcohol level of 240 mg/100 ml or more: Secondary | ICD-10-CM | POA: Insufficient documentation

## 2018-06-05 LAB — COMPREHENSIVE METABOLIC PANEL
ALT: 170 U/L — AB (ref 0–44)
AST: 258 U/L — ABNORMAL HIGH (ref 15–41)
Albumin: 3.2 g/dL — ABNORMAL LOW (ref 3.5–5.0)
Alkaline Phosphatase: 64 U/L (ref 38–126)
Anion gap: 11 (ref 5–15)
BUN: 13 mg/dL (ref 8–23)
CHLORIDE: 107 mmol/L (ref 98–111)
CO2: 21 mmol/L — AB (ref 22–32)
Calcium: 7.9 mg/dL — ABNORMAL LOW (ref 8.9–10.3)
Creatinine, Ser: 1.15 mg/dL (ref 0.61–1.24)
GFR calc Af Amer: >60 mL/min (ref >60)
GFR calc non Af Amer: >60 mL/min (ref >60)
Glucose, Bld: 111 mg/dL — ABNORMAL HIGH (ref 70–99)
Potassium: 3.4 mmol/L — ABNORMAL LOW (ref 3.5–5.1)
SODIUM: 139 mmol/L (ref 135–145)
Total Bilirubin: 0.9 mg/dL (ref 0.3–1.2)
Total Protein: 8.4 g/dL — ABNORMAL HIGH (ref 6.5–8.1)

## 2018-06-05 LAB — CBC WITH DIFFERENTIAL/PLATELET
Abs Immature Granulocytes: 0.03 10*3/uL (ref 0.00–0.07)
BASOS ABS: 0.1 10*3/uL (ref 0.0–0.1)
Basophils Relative: 1 %
Eosinophils Absolute: 0.1 10*3/uL (ref 0.0–0.5)
Eosinophils Relative: 1 %
HCT: 42 % (ref 39.0–52.0)
Hemoglobin: 13.9 g/dL (ref 13.0–17.0)
IMMATURE GRANULOCYTES: 0 %
LYMPHS ABS: 12.4 10*3/uL — AB (ref 0.7–4.0)
Lymphocytes Relative: 81 %
MCH: 35.8 pg — ABNORMAL HIGH (ref 26.0–34.0)
MCHC: 33.1 g/dL (ref 30.0–36.0)
MCV: 108.2 fL — ABNORMAL HIGH (ref 80.0–100.0)
Monocytes Absolute: 0.8 10*3/uL (ref 0.1–1.0)
Monocytes Relative: 5 %
Neutro Abs: 1.9 10*3/uL (ref 1.7–7.7)
Neutrophils Relative %: 12 %
Platelets: 105 10*3/uL — ABNORMAL LOW (ref 150–400)
RBC: 3.88 MIL/uL — ABNORMAL LOW (ref 4.22–5.81)
RDW: 12.5 % (ref 11.5–15.5)
WBC: 15.3 10*3/uL — ABNORMAL HIGH (ref 4.0–10.5)
nRBC: 0.2 % (ref 0.0–0.2)

## 2018-06-05 LAB — URINALYSIS, ROUTINE W REFLEX MICROSCOPIC
Bilirubin Urine: NEGATIVE
Glucose, UA: NEGATIVE mg/dL
Hgb urine dipstick: NEGATIVE
Ketones, ur: NEGATIVE mg/dL
Leukocytes, UA: NEGATIVE
NITRITE: NEGATIVE
Protein, ur: NEGATIVE mg/dL
Specific Gravity, Urine: 1.002 — ABNORMAL LOW (ref 1.005–1.030)
pH: 6 (ref 5.0–8.0)

## 2018-06-05 LAB — RAPID URINE DRUG SCREEN, HOSP PERFORMED
Amphetamines: NOT DETECTED
BENZODIAZEPINES: NOT DETECTED
Barbiturates: NOT DETECTED
COCAINE: NOT DETECTED
Opiates: NOT DETECTED
Tetrahydrocannabinol: NOT DETECTED

## 2018-06-05 LAB — MAGNESIUM: Magnesium: 1.5 mg/dL — ABNORMAL LOW (ref 1.7–2.4)

## 2018-06-05 LAB — ETHANOL: Alcohol, Ethyl (B): 340 mg/dL (ref <10)

## 2018-06-05 MED ORDER — MAGNESIUM OXIDE 400 (241.3 MG) MG PO TABS
400.0000 mg | ORAL_TABLET | Freq: Once | ORAL | Status: AC
  Administered 2018-06-05: 400 mg via ORAL
  Filled 2018-06-05: qty 1

## 2018-06-05 NOTE — ED Triage Notes (Signed)
Pt BIBA from home. Pt got a hold of some liquor. Pt has been drunk all day. Pt had falls today. They wanted to go to the [place], but was unable to find transportation.

## 2018-06-05 NOTE — ED Provider Notes (Signed)
[name]-EMERGENCY DEPT Provider Note   CSN: [id number] Arrival date & time: 06/05/18  1800     History   Chief Complaint Chief Complaint  Patient presents with  . Alcohol Intoxication    HPI Ryan Montgomery is a 63 y.o. male.  Pt presents to the ED today with alcohol abuse.  The pt's wife called EMS because he had fallen and she could not get him up.  She said he drinks heavily every day.  He will sleep it off and then go to work in the morning.  He gets more alcohol on the way home and the cycle starts again.  She said he falls frequently.  He has Hepatitis C and has refused to get a biopsy or treatment for it.  She has tried to get him help with [place], but he has refused all detox.  He does not want to quit drinking.  He initially told me that he did not drink tonight.  Pt denies any pain.  He has been able to ambulate.     Past Medical History:  Diagnosis Date  . Chickenpox   . Chickenpox   . Hepatitis C     Patient Active Problem List   Diagnosis Date Noted  . Hepatitis C 07/18/2013  . CELLULITIS, FOOT 11/13/2007  . TINEA PEDIS 04/28/2007  . CHICKENPOX, HX OF 04/28/2007    Past Surgical History:  Procedure Laterality Date  . colonoscopy  08-28-08   per Dr. Jarold MottoPatterson, clear, repeat in 10 yrs   . fx rt ankle     pins placed        Home Medications    Prior to Admission medications   Medication Sig Start Date End Date Taking? Authorizing Provider  diclofenac (VOLTAREN) 75 MG EC tablet Take 1 tablet (75 mg total) by mouth 2 (two) times daily as needed for moderate pain. Patient not taking: Reported on 06/05/2018 07/18/13   Nelwyn SalisburyFry, Stephen A, MD    Family History Family History  Problem Relation Age of Onset  . Hypertension Other   . Lung cancer Other     Social History Social History   Tobacco Use  . Smoking status: Current Every Day Smoker    Packs/day: 1.00    Types: Cigarettes  . Smokeless tobacco: Never Used  Substance Use  Topics  . Alcohol use: Yes    Comment: mixed drink daily  . Drug use: No     Allergies   Moxifloxacin   Review of Systems Review of Systems  All other systems reviewed and are negative.    Physical Exam Updated Vital Signs BP 98/80 (BP Location: Left Arm)   Pulse (!) 110   Temp (!) 97.2 F (36.2 C) (Oral)   Resp 16   Ht 6' (1.829 m)   Wt 83.9 kg   SpO2 96%   BMI 25.09 kg/m   Physical Exam Vitals signs and nursing note reviewed.  Constitutional:      Appearance: Normal appearance.     Comments: +Etoh  HENT:     Head: Normocephalic and atraumatic.     Right Ear: External ear normal.     Left Ear: External ear normal.     Nose: Nose normal.     Mouth/Throat:     Mouth: Mucous membranes are moist.  Eyes:     Extraocular Movements: Extraocular movements intact.     Pupils: Pupils are equal, round, and reactive to light.  Neck:  Musculoskeletal: Normal range of motion.  Cardiovascular:     Rate and Rhythm: Normal rate and regular rhythm.  Pulmonary:     Effort: Pulmonary effort is normal.     Breath sounds: Normal breath sounds.  Abdominal:     General: Abdomen is flat.  Musculoskeletal: Normal range of motion.  Skin:    General: Skin is warm.     Capillary Refill: Capillary refill takes less than 2 seconds.  Neurological:     Mental Status: He is alert.     Comments: Pt is able to walk, but has abnormal coordination due to alcohol.      ED Treatments / Results  Labs (all labs ordered are listed, but only abnormal results are displayed) Labs Reviewed  COMPREHENSIVE METABOLIC PANEL - Abnormal; Notable for the following components:      Result Value   Potassium 3.4 (*)    CO2 21 (*)    Glucose, Bld 111 (*)    Calcium 7.9 (*)    Total Protein 8.4 (*)    Albumin 3.2 (*)    AST 258 (*)    ALT 170 (*)    All other components within normal limits  ETHANOL - Abnormal; Notable for the following components:   Alcohol, Ethyl (B) 340 (*)    All other  components within normal limits  CBC WITH DIFFERENTIAL/PLATELET - Abnormal; Notable for the following components:   WBC 15.3 (*)    RBC 3.88 (*)    MCV 108.2 (*)    MCH 35.8 (*)    Platelets 105 (*)    Lymphs Abs 12.4 (*)    All other components within normal limits  URINALYSIS, ROUTINE W REFLEX MICROSCOPIC - Abnormal; Notable for the following components:   Color, Urine STRAW (*)    Specific Gravity, Urine 1.002 (*)    All other components within normal limits  MAGNESIUM - Abnormal; Notable for the following components:   Magnesium 1.5 (*)    All other components within normal limits  RAPID URINE DRUG SCREEN, HOSP PERFORMED    EKG None  Radiology No results found.  Procedures Procedures (including critical care time)  Medications Ordered in ED Medications  magnesium oxide (MAG-OX) tablet 400 mg (has no administration in time range)     Initial Impression / Assessment and Plan / ED Course  I have reviewed the triage vital signs and the nursing notes.  Pertinent labs & imaging results that were available during my care of the patient were reviewed by me and considered in my medical decision making (see chart for details).    Pt's magnesium is low, so he was given mgox.   Pt's wife is very frustrated with patient.  I think she is at her wit's end and does not know what to do.  I told her that if he does not want to stop drinking (which he does not want to do), there is nothing I can do.  Pt and wife given resources.   Final Clinical Impressions(s) / ED Diagnoses   Final diagnoses:  ETOH abuse  Hypomagnesemia    ED Discharge Orders    None       Jacalyn Lefevre, MD 06/05/18 1931

## 2018-06-05 NOTE — ED Notes (Signed)
Bed: [facility] Expected date:  Expected time:  Means of arrival:  Comments: EMS ETOH 

## 2018-06-05 NOTE — ED Notes (Signed)
Pt ambulated to the restroom, changed into blue scrubs

## 2018-06-05 NOTE — ED Notes (Signed)
Date and time results received: 06/05/18 7:00 PM  (use smartphrase ".now" to insert current time)  Test: ETOH Critical Value: 340  Name of Provider Notified: [facility]  Orders Received? Or Actions Taken?: awaiting orders

## 2019-04-03 DIAGNOSIS — S32402A Unspecified fracture of left acetabulum, initial encounter for closed fracture: Secondary | ICD-10-CM | POA: Insufficient documentation

## 2019-07-05 ENCOUNTER — Other Ambulatory Visit: Payer: Self-pay | Admitting: Orthopedic Surgery

## 2019-07-05 DIAGNOSIS — S32402A Unspecified fracture of left acetabulum, initial encounter for closed fracture: Secondary | ICD-10-CM

## 2019-07-14 ENCOUNTER — Other Ambulatory Visit: Payer: Non-veteran care

## 2019-07-14 ENCOUNTER — Other Ambulatory Visit (HOSPITAL_COMMUNITY): Payer: Self-pay | Admitting: Orthopedic Surgery

## 2019-07-14 DIAGNOSIS — S32402A Unspecified fracture of left acetabulum, initial encounter for closed fracture: Secondary | ICD-10-CM

## 2019-07-18 ENCOUNTER — Other Ambulatory Visit: Payer: Non-veteran care

## 2019-07-22 ENCOUNTER — Ambulatory Visit (HOSPITAL_COMMUNITY)
Admission: RE | Admit: 2019-07-22 | Discharge: 2019-07-22 | Disposition: A | Discharge status: Home / Self Care | Payer: No Typology Code available for payment source | Source: Ambulatory Visit | Attending: Orthopedic Surgery | Admitting: Orthopedic Surgery

## 2019-07-22 ENCOUNTER — Other Ambulatory Visit: Payer: Self-pay

## 2019-07-22 ENCOUNTER — Encounter (HOSPITAL_COMMUNITY): Payer: Self-pay

## 2019-07-22 DIAGNOSIS — S32402A Unspecified fracture of left acetabulum, initial encounter for closed fracture: Secondary | ICD-10-CM | POA: Diagnosis not present

## 2019-07-22 IMAGING — CT CT HIP*L* W/O CM
2 of 3 series · 17 of 46 positions shown, 19 images · non-contrast
Comparison: None.

CLINICAL DATA: Left hip pain since the patient tripped over a rug
while walking [DATE].

EXAM:
CT OF THE LEFT HIP WITHOUT CONTRAST
TECHNIQUE: Multidetector CT imaging of the left hip was performed according to
the standard protocol. Multiplanar CT image reconstructions were
also generated.

[Series 5: coronal st · coronal · 0.39mm/px · 3 of 101 slices shown]
[im 34/101  soft-tissue]
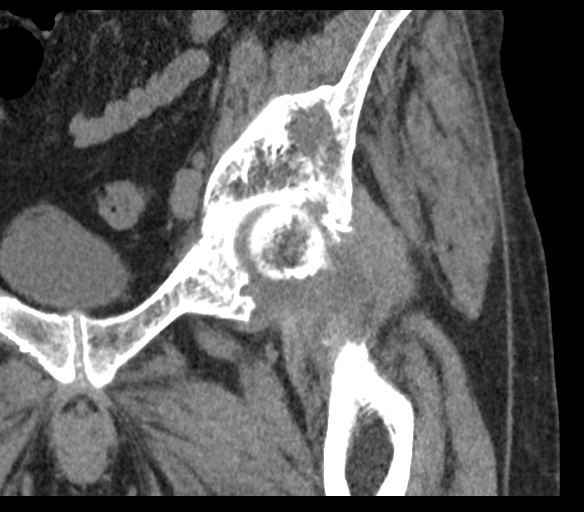
[im 45/101  soft-tissue]
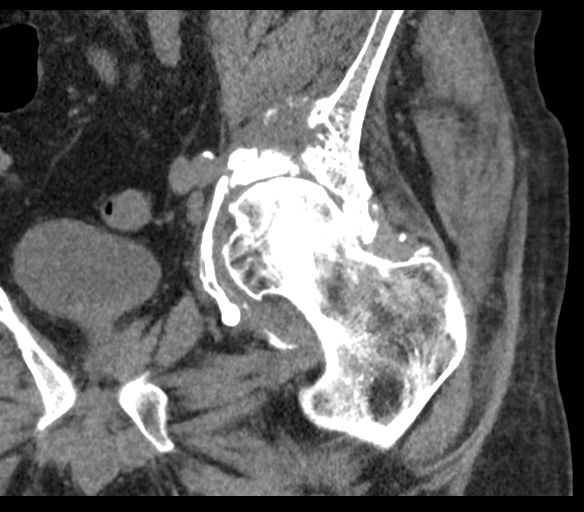
[im 56/101  soft-tissue]
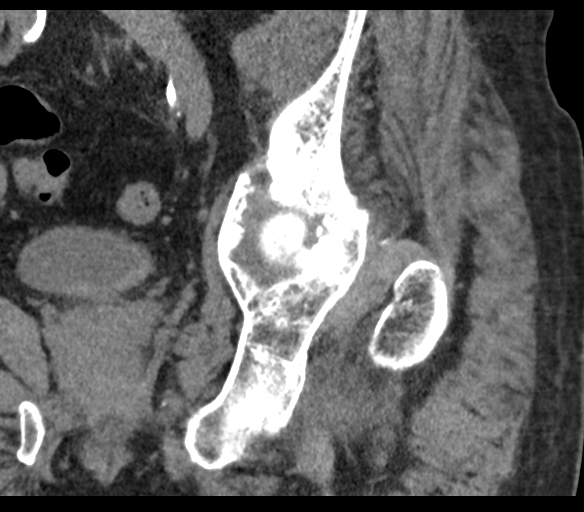

[Series 9: axial st · axial · 0.39mm/px · z∈[-222,-60]mm · 14 of 95 slices shown, 16 images]
[im 7/95  soft-tissue]
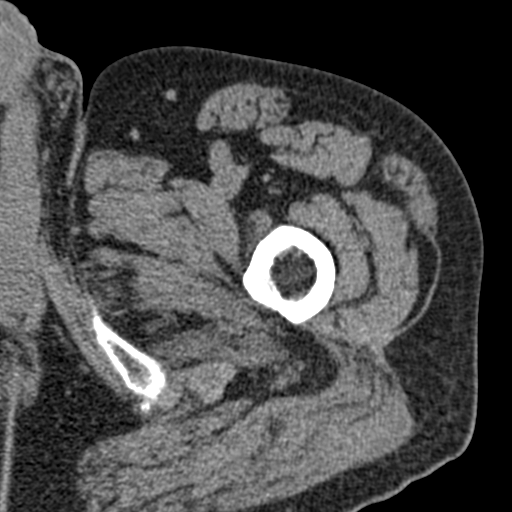
[im 7/95  bone]
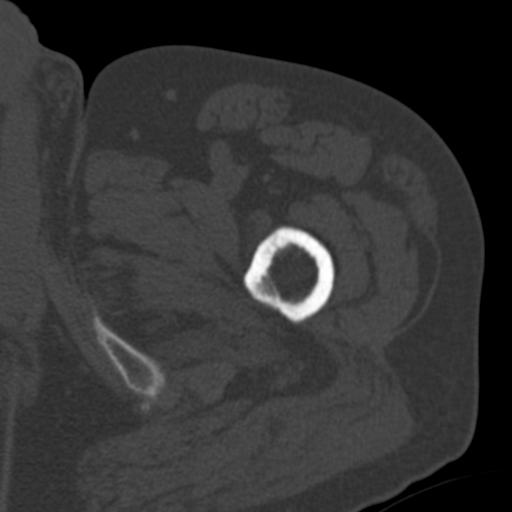
[im 13/95  soft-tissue]
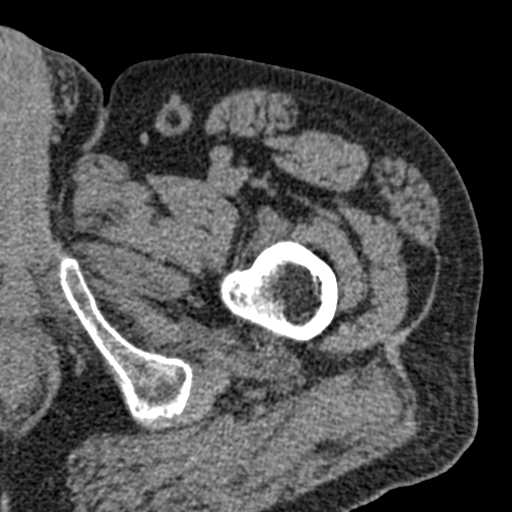
[im 19/95  soft-tissue]
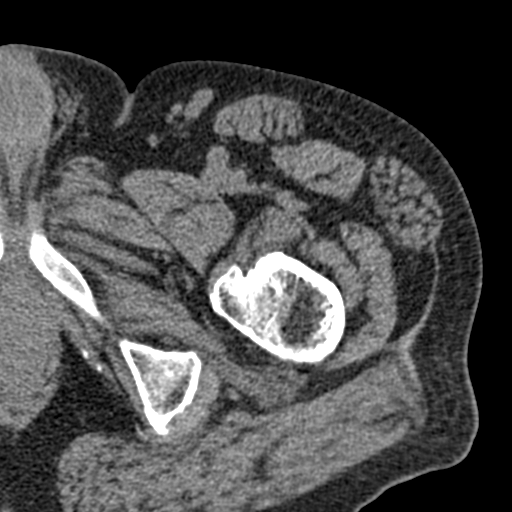
[im 25/95  soft-tissue]
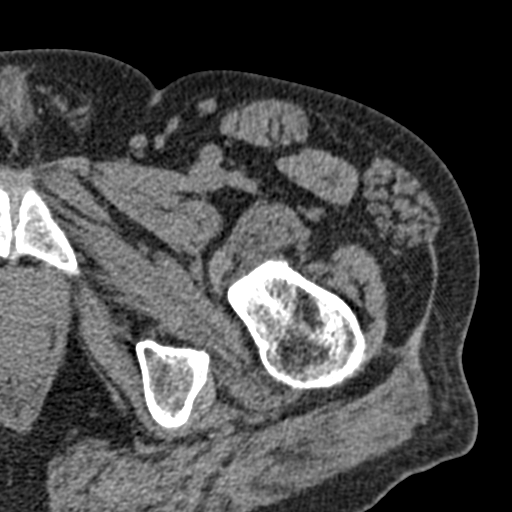
[im 31/95  soft-tissue]
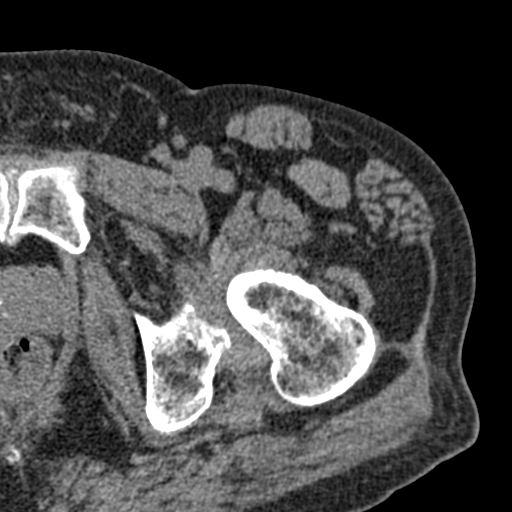
[im 37/95  soft-tissue]
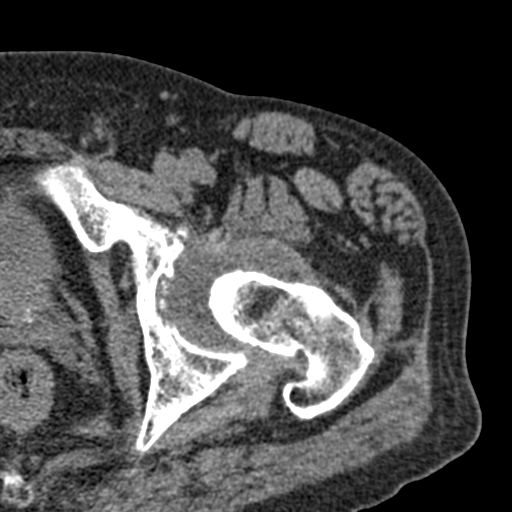
[im 43/95  soft-tissue]
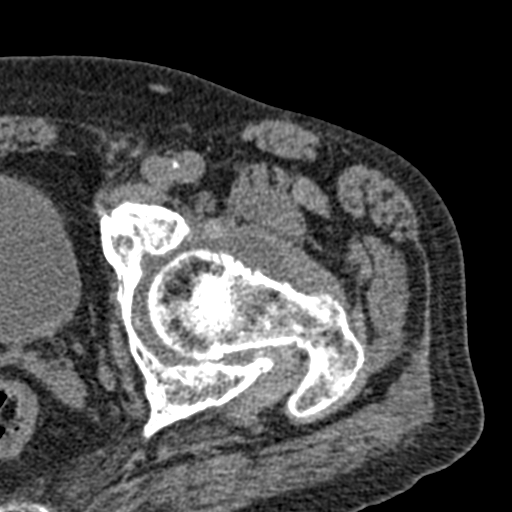
[im 52/95  soft-tissue]
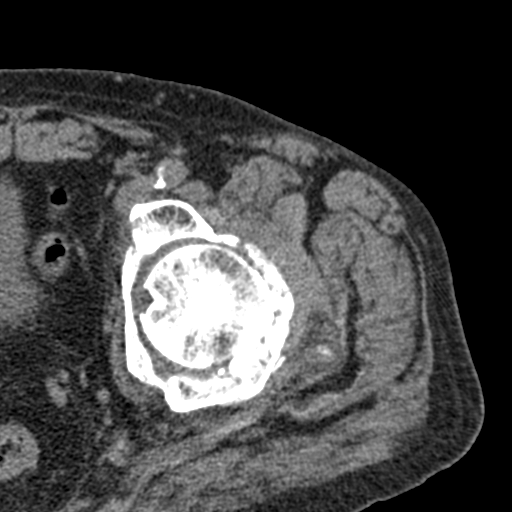
[im 58/95  soft-tissue]
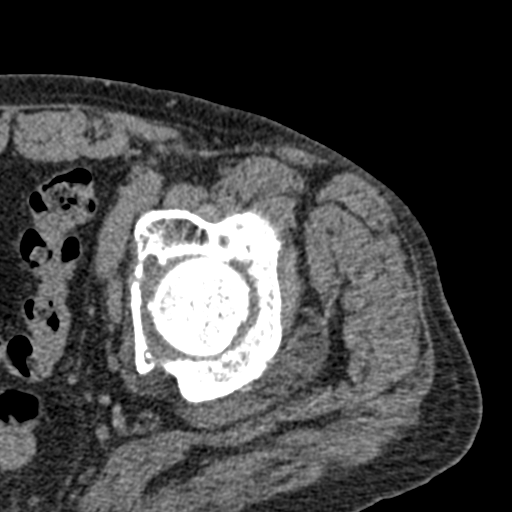
[im 58/95  bone]
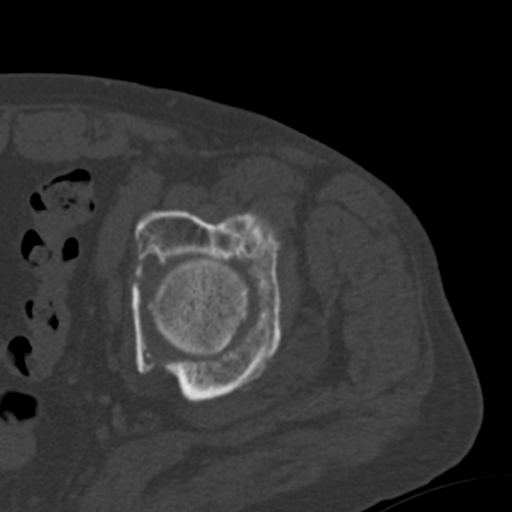
[im 64/95  soft-tissue]
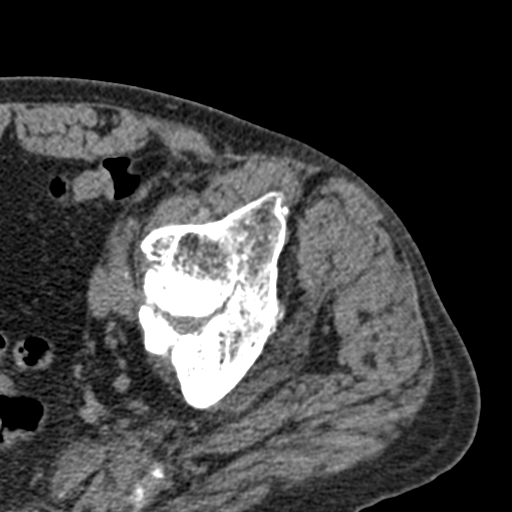
[im 70/95  soft-tissue]
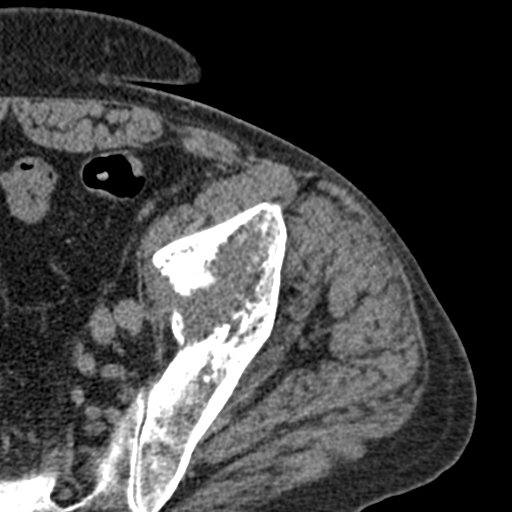
[im 76/95  soft-tissue]
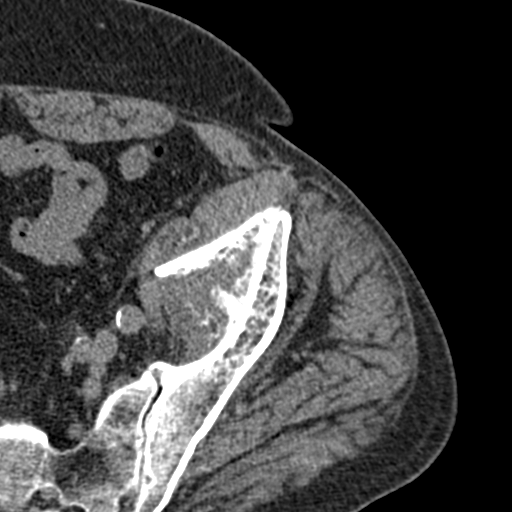
[im 82/95  soft-tissue]
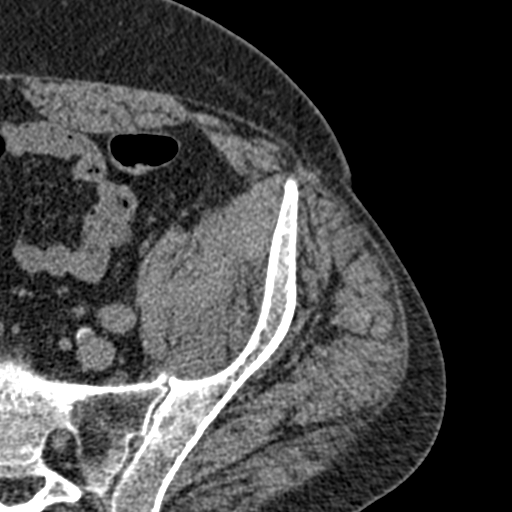
[im 88/95  soft-tissue]
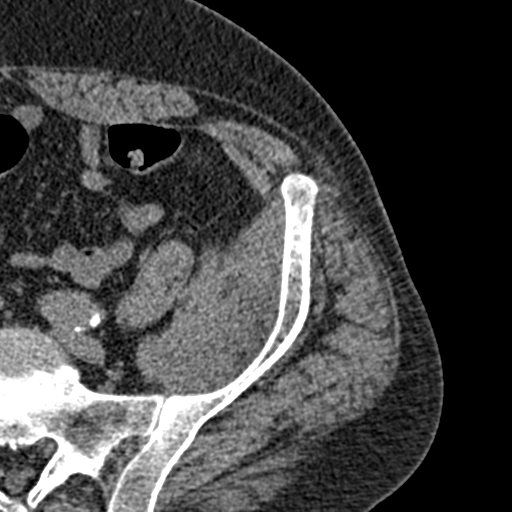

[17 of 46 positions shown; findings below may reference images not displayed]

FINDINGS: Bones/Joint/Cartilage

The patient has a comminuted fracture of the left acetabulum. The
fracture includes an obliquely oriented component through the
posterior roof with no bridging bone. The medial wall of the
acetabulum is markedly thinned. There is nonunion of a fracture
through the posterior aspect of the medial wall with displacement
posteriorly 1.4 cm. There is secondary acetabular protrusio. Also
seen is a gap in the superior, posterior aspect of the roof of the
acetabulum measuring up to 0.8 cm AP x 2.8 cm transverse. The
fracture extends into the acetabulum superior to the acetabulum into
the ileum where there is fragment distraction of 1.9 cm and little
to no bridging bone. There is also a fracture of the anterior wall
of the acetabulum which is healed.

Ligaments

Suboptimally assessed by CT.

Muscles and Tendons

Appear intact.

Soft tissues

Moderate hip joint effusion.
IMPRESSION: Complex acetabular fracture is largely a nonunion with displaced
fracture fragments as described above. Acetabular protrusio
secondary to the fracture is noted.

## 2019-08-18 DIAGNOSIS — M12552 Traumatic arthropathy, left hip: Secondary | ICD-10-CM | POA: Insufficient documentation

## 2019-08-29 DIAGNOSIS — M109 Gout, unspecified: Secondary | ICD-10-CM | POA: Insufficient documentation

## 2019-08-29 DIAGNOSIS — F1010 Alcohol abuse, uncomplicated: Secondary | ICD-10-CM | POA: Diagnosis present

## 2019-09-12 HISTORY — PX: JOINT REPLACEMENT: SHX530

## 2019-09-12 HISTORY — PX: TOTAL HIP ARTHROPLASTY: SHX124

## 2019-11-27 ENCOUNTER — Encounter (HOSPITAL_COMMUNITY): Payer: Self-pay

## 2019-11-27 ENCOUNTER — Emergency Department (HOSPITAL_COMMUNITY): Payer: No Typology Code available for payment source

## 2019-11-27 ENCOUNTER — Inpatient Hospital Stay (HOSPITAL_COMMUNITY)
Admission: EM | Admit: 2019-11-27 | Discharge: 2019-11-29 | DRG: 312 | Disposition: A | Discharge status: Home Health | Payer: No Typology Code available for payment source | Attending: Family Medicine | Admitting: Family Medicine

## 2019-11-27 ENCOUNTER — Other Ambulatory Visit: Payer: Self-pay

## 2019-11-27 ENCOUNTER — Inpatient Hospital Stay (HOSPITAL_COMMUNITY): Payer: No Typology Code available for payment source

## 2019-11-27 DIAGNOSIS — F17210 Nicotine dependence, cigarettes, uncomplicated: Secondary | ICD-10-CM | POA: Diagnosis present

## 2019-11-27 DIAGNOSIS — R55 Syncope and collapse: Secondary | ICD-10-CM | POA: Diagnosis present

## 2019-11-27 DIAGNOSIS — G40909 Epilepsy, unspecified, not intractable, without status epilepticus: Secondary | ICD-10-CM | POA: Diagnosis not present

## 2019-11-27 DIAGNOSIS — Z881 Allergy status to other antibiotic agents status: Secondary | ICD-10-CM | POA: Diagnosis not present

## 2019-11-27 DIAGNOSIS — Z7289 Other problems related to lifestyle: Secondary | ICD-10-CM | POA: Diagnosis not present

## 2019-11-27 DIAGNOSIS — Z20822 Contact with and (suspected) exposure to covid-19: Secondary | ICD-10-CM | POA: Diagnosis present

## 2019-11-27 DIAGNOSIS — M109 Gout, unspecified: Secondary | ICD-10-CM | POA: Diagnosis present

## 2019-11-27 DIAGNOSIS — R569 Unspecified convulsions: Secondary | ICD-10-CM | POA: Diagnosis present

## 2019-11-27 DIAGNOSIS — Z79891 Long term (current) use of opiate analgesic: Secondary | ICD-10-CM | POA: Diagnosis not present

## 2019-11-27 DIAGNOSIS — Z8619 Personal history of other infectious and parasitic diseases: Secondary | ICD-10-CM | POA: Diagnosis not present

## 2019-11-27 DIAGNOSIS — Z7982 Long term (current) use of aspirin: Secondary | ICD-10-CM | POA: Diagnosis not present

## 2019-11-27 DIAGNOSIS — F1290 Cannabis use, unspecified, uncomplicated: Secondary | ICD-10-CM | POA: Diagnosis present

## 2019-11-27 DIAGNOSIS — Z8673 Personal history of transient ischemic attack (TIA), and cerebral infarction without residual deficits: Secondary | ICD-10-CM

## 2019-11-27 DIAGNOSIS — Z79899 Other long term (current) drug therapy: Secondary | ICD-10-CM | POA: Diagnosis not present

## 2019-11-27 LAB — COMPREHENSIVE METABOLIC PANEL
ALT: 12 U/L (ref 0–44)
AST: 18 U/L (ref 15–41)
Albumin: 3.5 g/dL (ref 3.5–5.0)
Alkaline Phosphatase: 77 U/L (ref 38–126)
Anion gap: 9 (ref 5–15)
BUN: 13 mg/dL (ref 8–23)
CO2: 24 mmol/L (ref 22–32)
Calcium: 9.2 mg/dL (ref 8.9–10.3)
Chloride: 107 mmol/L (ref 98–111)
Creatinine, Ser: 1.27 mg/dL — ABNORMAL HIGH (ref 0.61–1.24)
GFR calc Af Amer: >60 mL/min (ref >60)
GFR calc non Af Amer: 60 mL/min — ABNORMAL LOW (ref >60)
Glucose, Bld: 101 mg/dL — ABNORMAL HIGH (ref 70–99)
Potassium: 4 mmol/L (ref 3.5–5.1)
Sodium: 140 mmol/L (ref 135–145)
Total Bilirubin: 0.5 mg/dL (ref 0.3–1.2)
Total Protein: 8.4 g/dL — ABNORMAL HIGH (ref 6.5–8.1)

## 2019-11-27 LAB — CBC WITH DIFFERENTIAL/PLATELET
Abs Immature Granulocytes: 0.05 10*3/uL (ref 0.00–0.07)
Basophils Absolute: 0.1 10*3/uL (ref 0.0–0.1)
Basophils Relative: 1 %
Eosinophils Absolute: 0.1 10*3/uL (ref 0.0–0.5)
Eosinophils Relative: 1 %
HCT: 40.2 % (ref 39.0–52.0)
Hemoglobin: 12.8 g/dL — ABNORMAL LOW (ref 13.0–17.0)
Immature Granulocytes: 1 %
Lymphocytes Relative: 55 %
Lymphs Abs: 4.9 10*3/uL — ABNORMAL HIGH (ref 0.7–4.0)
MCH: 31.4 pg (ref 26.0–34.0)
MCHC: 31.8 g/dL (ref 30.0–36.0)
MCV: 98.5 fL (ref 80.0–100.0)
Monocytes Absolute: 0.7 10*3/uL (ref 0.1–1.0)
Monocytes Relative: 8 %
Neutro Abs: 3 10*3/uL (ref 1.7–7.7)
Neutrophils Relative %: 34 %
Platelets: 181 10*3/uL (ref 150–400)
RBC: 4.08 MIL/uL — ABNORMAL LOW (ref 4.22–5.81)
RDW: 12.6 % (ref 11.5–15.5)
WBC: 8.7 10*3/uL (ref 4.0–10.5)
nRBC: 0 % (ref 0.0–0.2)

## 2019-11-27 LAB — RAPID URINE DRUG SCREEN, HOSP PERFORMED
Amphetamines: NOT DETECTED
Barbiturates: NOT DETECTED
Benzodiazepines: NOT DETECTED
Cocaine: NOT DETECTED
Opiates: NOT DETECTED
Tetrahydrocannabinol: POSITIVE — AB

## 2019-11-27 LAB — URINALYSIS, ROUTINE W REFLEX MICROSCOPIC
Bilirubin Urine: NEGATIVE
Glucose, UA: NEGATIVE mg/dL
Hgb urine dipstick: NEGATIVE
Ketones, ur: NEGATIVE mg/dL
Leukocytes,Ua: NEGATIVE
Nitrite: NEGATIVE
Protein, ur: NEGATIVE mg/dL
Specific Gravity, Urine: 1.02 (ref 1.005–1.030)
pH: 5 (ref 5.0–8.0)

## 2019-11-27 LAB — CBC
HCT: 41.4 % (ref 39.0–52.0)
Hemoglobin: 12.9 g/dL — ABNORMAL LOW (ref 13.0–17.0)
MCH: 30.9 pg (ref 26.0–34.0)
MCHC: 31.2 g/dL (ref 30.0–36.0)
MCV: 99.3 fL (ref 80.0–100.0)
Platelets: 184 10*3/uL (ref 150–400)
RBC: 4.17 MIL/uL — ABNORMAL LOW (ref 4.22–5.81)
RDW: 12.8 % (ref 11.5–15.5)
WBC: 11.6 10*3/uL — ABNORMAL HIGH (ref 4.0–10.5)
nRBC: 0 % (ref 0.0–0.2)

## 2019-11-27 LAB — MAGNESIUM: Magnesium: 1.7 mg/dL (ref 1.7–2.4)

## 2019-11-27 LAB — SARS CORONAVIRUS 2 BY RT PCR (HOSPITAL ORDER, PERFORMED IN ~~LOC~~ HOSPITAL LAB): SARS Coronavirus 2: NEGATIVE

## 2019-11-27 IMAGING — DX DG CHEST 1V PORT
1 series · 1 of 1 positions shown · non-contrast
Comparison: None.

CLINICAL DATA: Syncope

EXAM:
PORTABLE CHEST 1 VIEW

[chest ap]
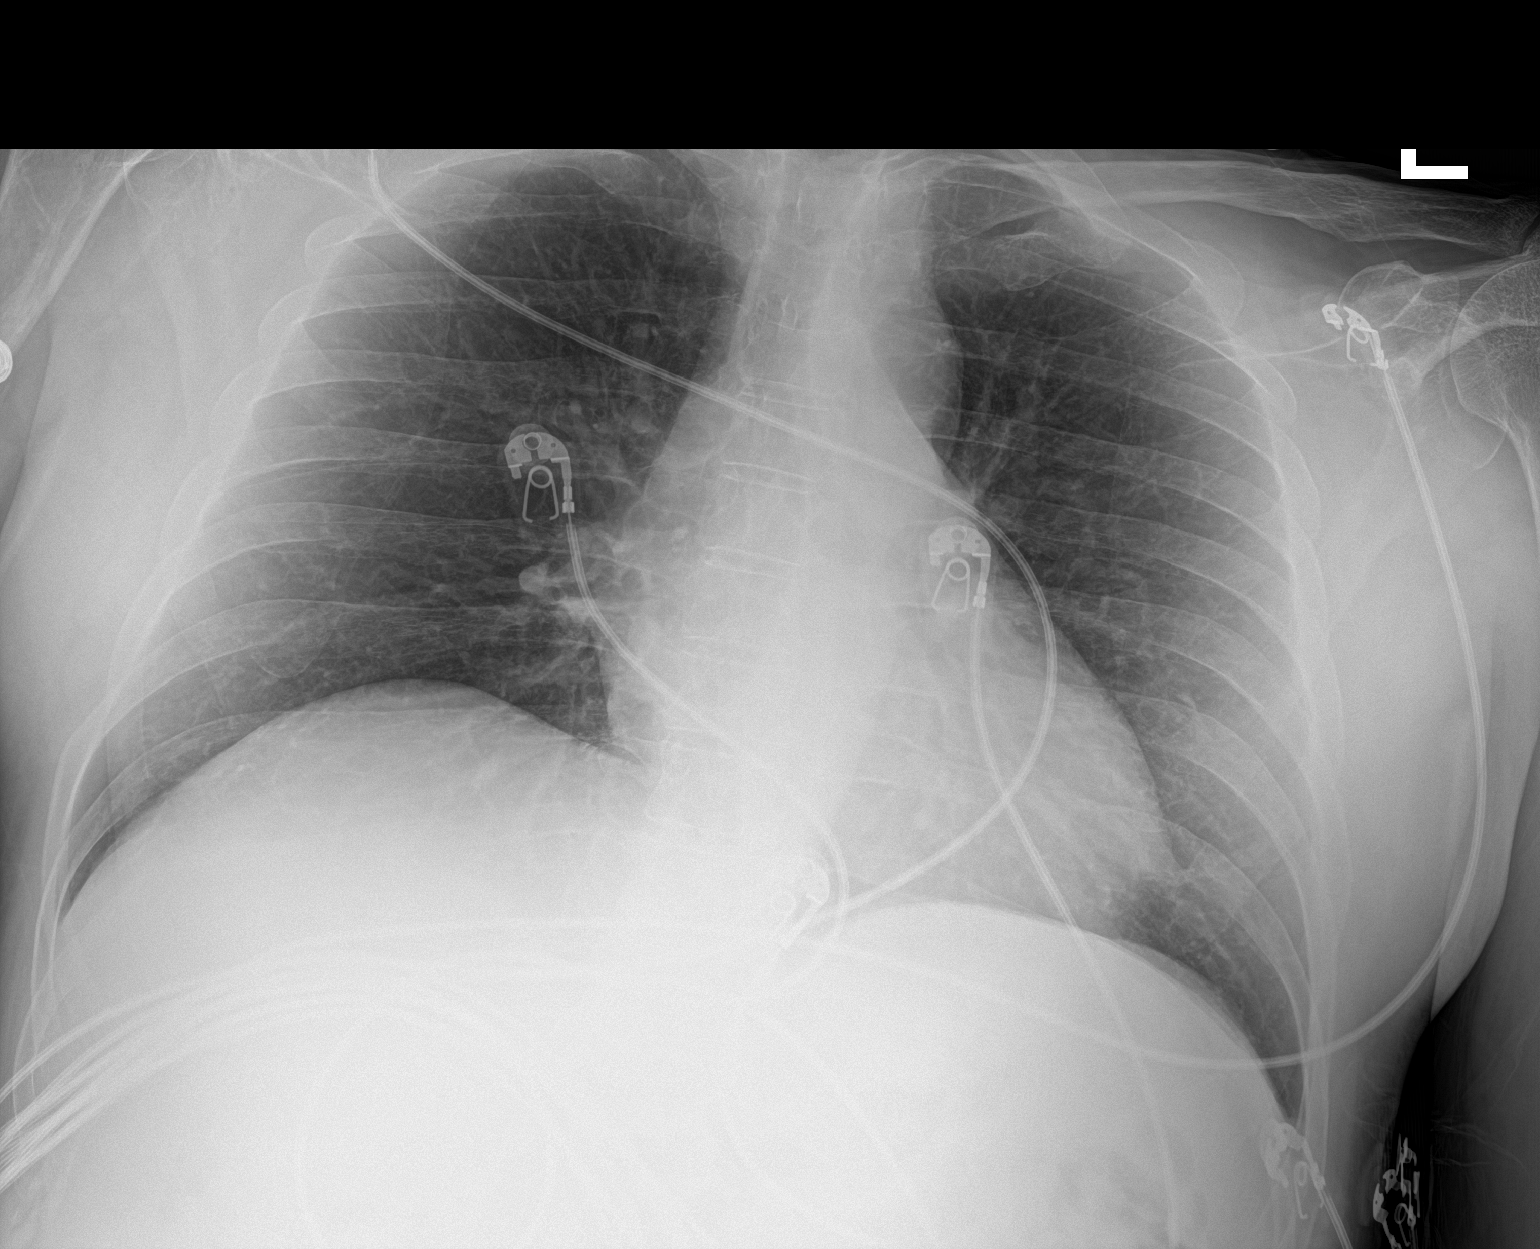

[1 of 1 positions shown; findings below may reference images not displayed]

FINDINGS: The heart size and mediastinal contours are within normal limits.
Both lungs are clear. The visualized skeletal structures are
unremarkable.
IMPRESSION: No active disease.

## 2019-11-27 IMAGING — CT CT HEAD W/O CM
4 series · 17 of 47 positions shown, 19 images · non-contrast
Comparison: None.

CLINICAL DATA: Seizure.

EXAM:
CT HEAD WITHOUT CONTRAST
TECHNIQUE: Contiguous axial images were obtained from the base of the skull
through the vertex without intravenous contrast.

[Series 3: head wo · axial · 0.44mm/px · z∈[-120,+0]mm · 7 of 33 slices shown, 9 images]
[im 5/33  brain]
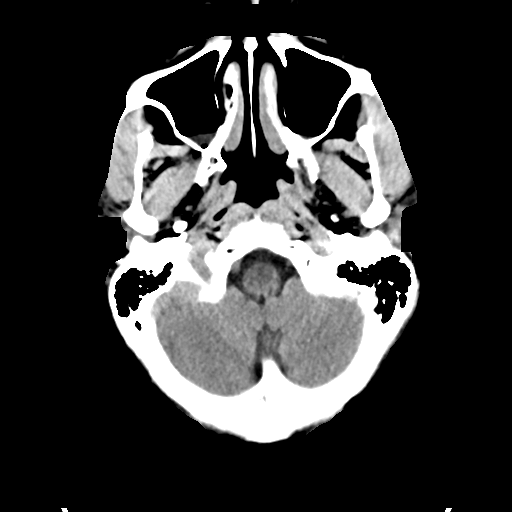
[im 5/33  bone]
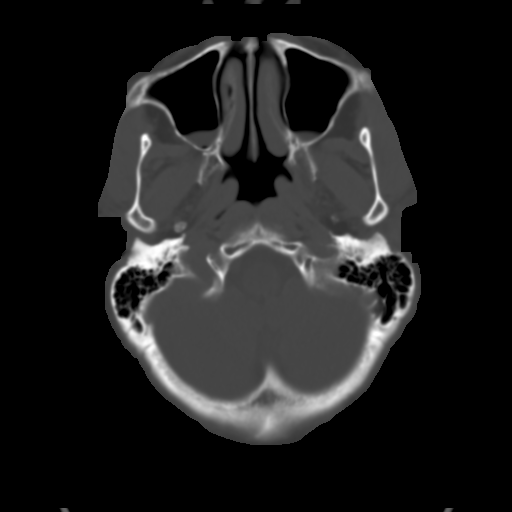
[im 9/33  brain]
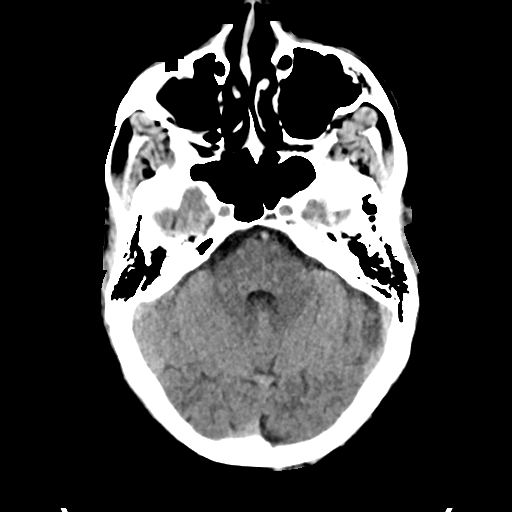
[im 13/33  brain]
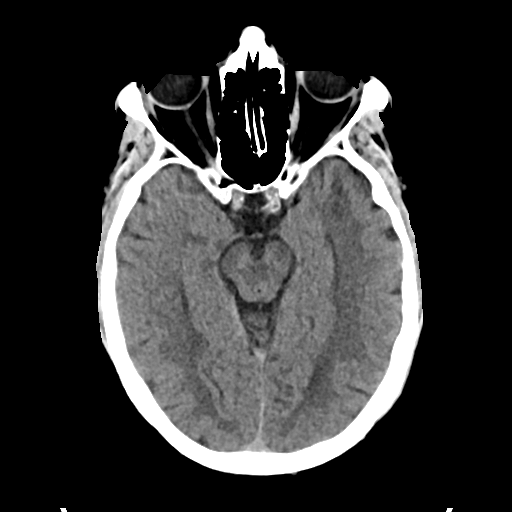
[im 17/33  brain]
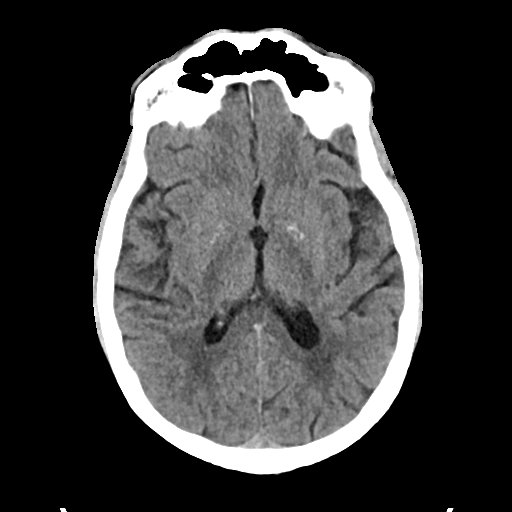
[im 21/33  brain]
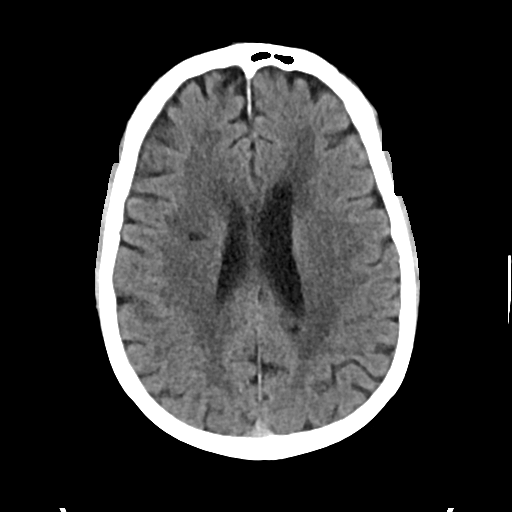
[im 21/33  bone]
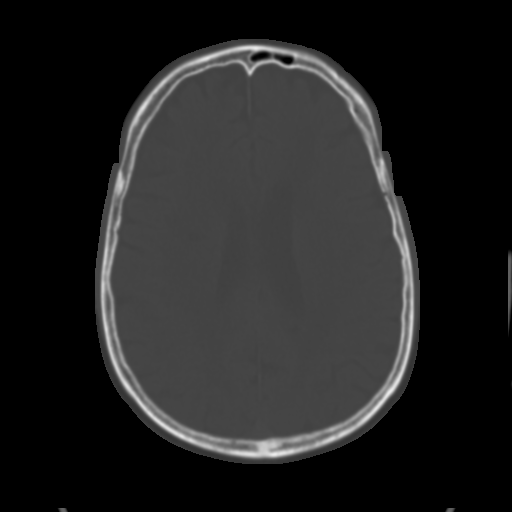
[im 25/33  brain]
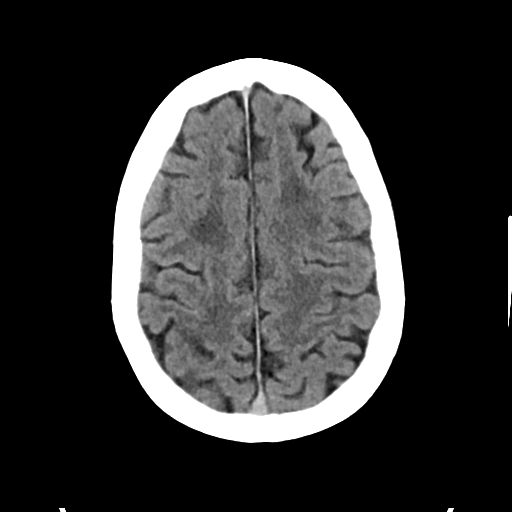
[im 29/33  brain]
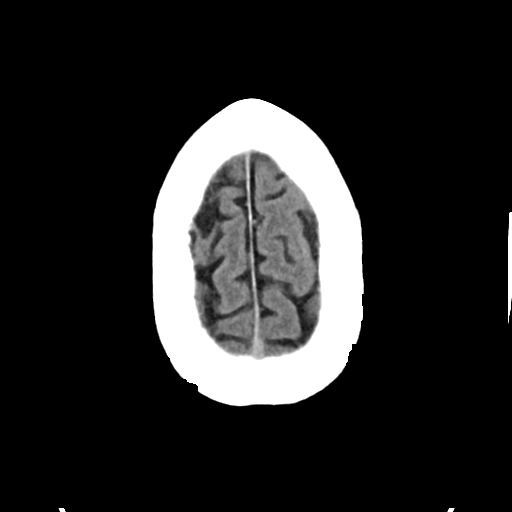

[Series 4: head bone · axial · 0.44mm/px · z∈[-124,-68]mm · 4 of 82 slices shown]
[im 9/82  bone]
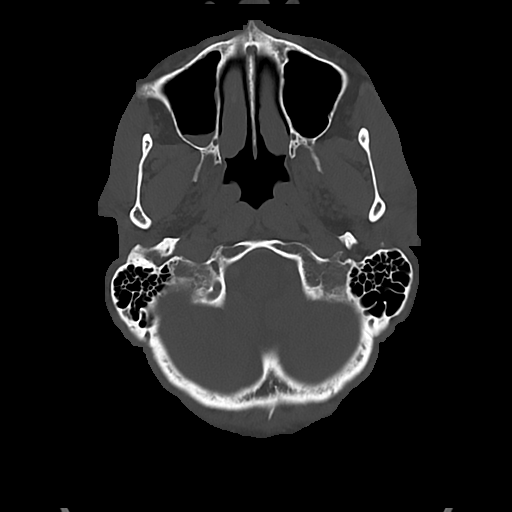
[im 17/82  bone]
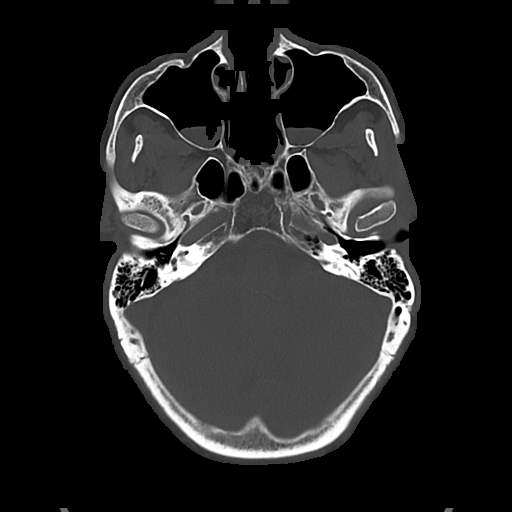
[im 25/82  bone]
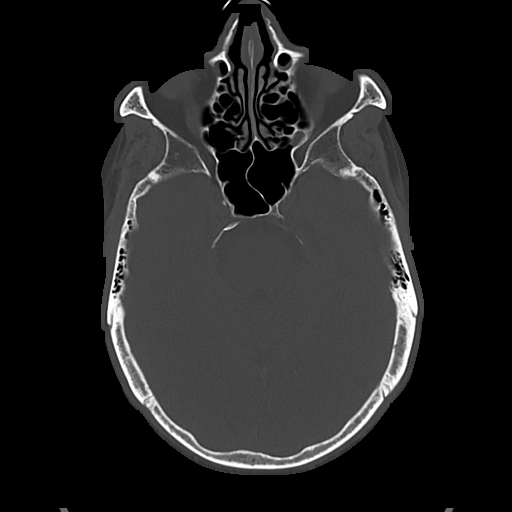
[im 37/82  bone]
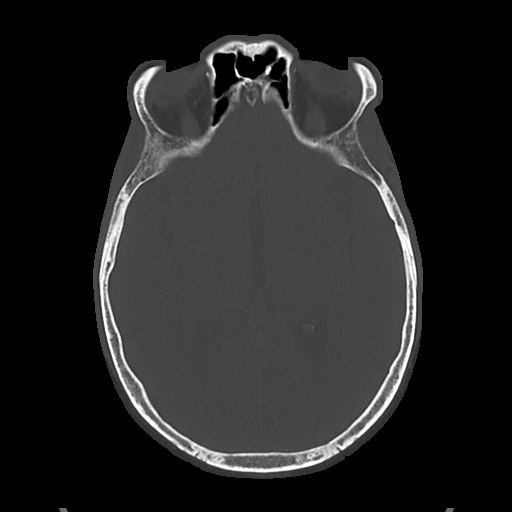

[Series 5: cor soft · coronal · 0.36mm/px · 3 of 74 slices shown]
[im 25/74  brain]
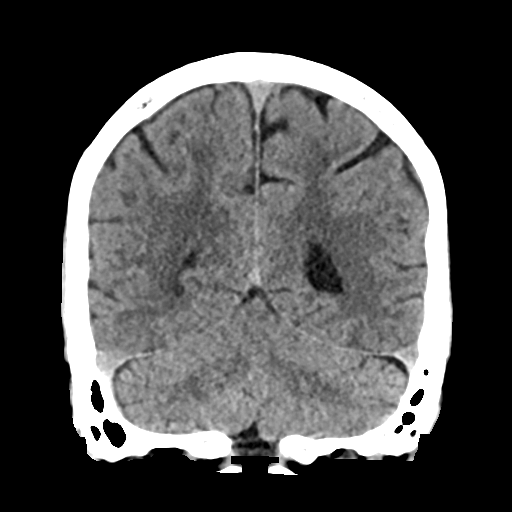
[im 33/74  brain]
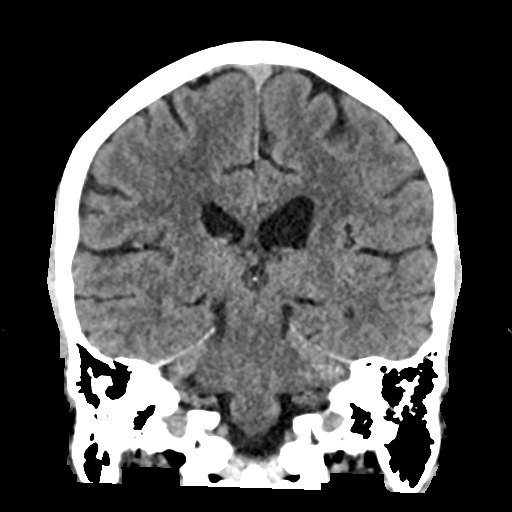
[im 41/74  brain]
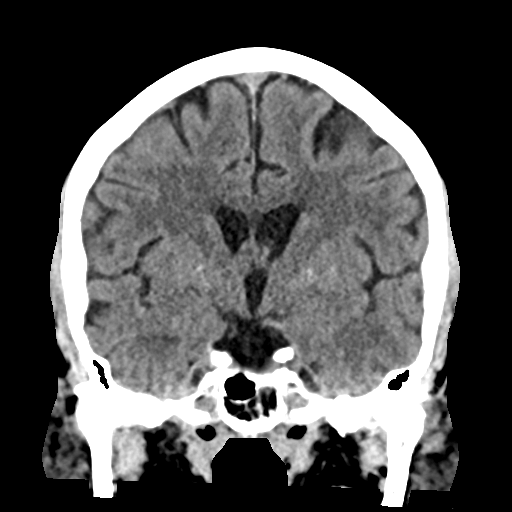

[Series 6: sag soft · sagittal · 0.36mm/px · 3 of 62 slices shown]
[im 21/62  brain]
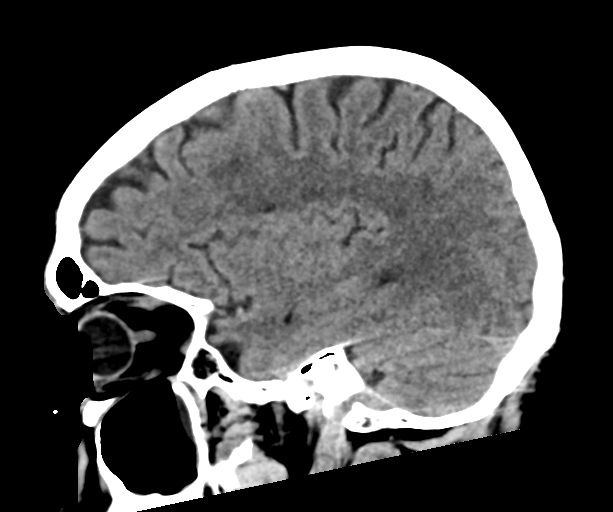
[im 31/62  brain]
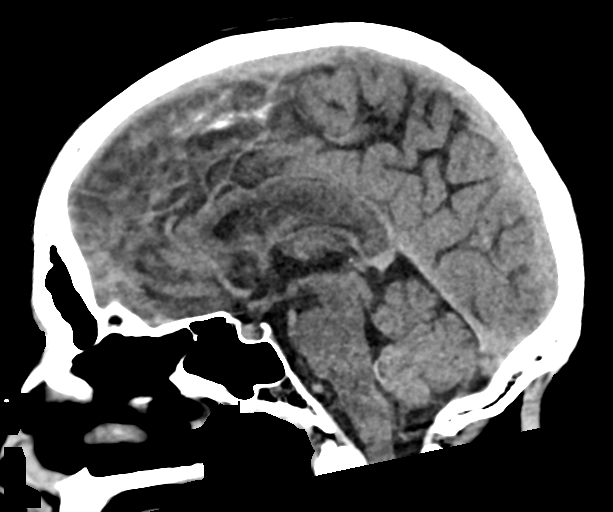
[im 41/62  brain]
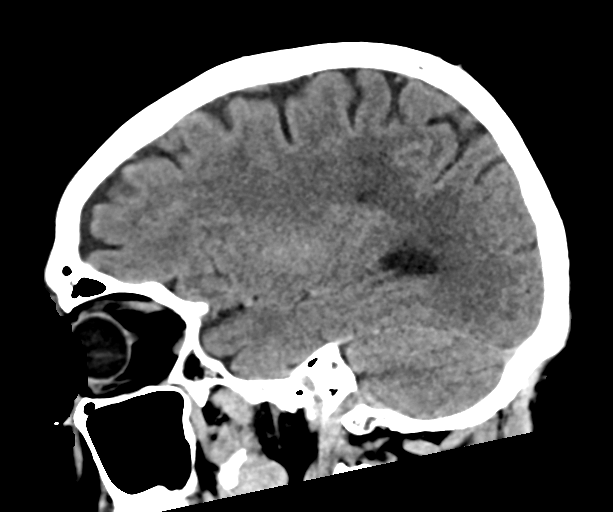

[17 of 47 positions shown; findings below may reference images not displayed]

FINDINGS: Brain: There is mild central and cortical atrophy. Periventricular
white matter changes are consistent with small vessel disease. There
is encephalomalacia in the superior RIGHT frontal lobe consistent
with remote infarct. Remote infarct identified within the LEFT
caudate nucleus. There is no intra or extra-axial fluid collection
or mass lesion. The basilar cisterns and ventricles have a normal
appearance. There is no CT evidence for acute infarction or
hemorrhage.

Vascular: There is atherosclerotic calcification of the internal
carotid arteries. No hyperdense vessels.

Skull: Normal. Negative for fracture or focal lesion.

Sinuses/Orbits: Small RIGHT mastoid effusion. Air-fluid levels
within both maxillary sinuses. Orbits are unremarkable.

Other: None
IMPRESSION: 1. No evidence for acute intracranial abnormality.
2. Atrophy and small vessel disease.
3. Encephalomalacia in the superior RIGHT frontal lobe consistent
with remote infarct.
4. Remote infarct of the LEFT caudate nucleus.
5. Small RIGHT mastoid effusion.
6. Bilateral possibly acute maxillary sinusitis.

## 2019-11-27 MED ORDER — SODIUM CHLORIDE 0.9 % IV BOLUS
1000.0000 mL | Freq: Once | INTRAVENOUS | Status: AC
  Administered 2019-11-27: 1000 mL via INTRAVENOUS

## 2019-11-27 MED ORDER — THIAMINE HCL 100 MG PO TABS
100.0000 mg | ORAL_TABLET | Freq: Every day | ORAL | Status: DC
  Administered 2019-11-27 – 2019-11-29 (×2): 100 mg via ORAL
  Filled 2019-11-27 (×2): qty 1

## 2019-11-27 MED ORDER — ACETAMINOPHEN 325 MG PO TABS: 650.0000 mg | ORAL_TABLET | Freq: Four times a day (QID) | ORAL | Status: DC | PRN

## 2019-11-27 MED ORDER — ADULT MULTIVITAMIN W/MINERALS CH
1.0000 | ORAL_TABLET | Freq: Every day | ORAL | Status: DC
  Administered 2019-11-27 – 2019-11-29 (×2): 1 via ORAL
  Filled 2019-11-27 (×2): qty 1

## 2019-11-27 MED ORDER — THIAMINE HCL 100 MG/ML IJ SOLN: 100.0000 mg | Freq: Every day | INTRAMUSCULAR | Status: DC

## 2019-11-27 MED ORDER — ACETAMINOPHEN 650 MG RE SUPP: 650.0000 mg | Freq: Four times a day (QID) | RECTAL | Status: DC | PRN

## 2019-11-27 MED ORDER — ASPIRIN EC 81 MG PO TBEC
81.0000 mg | DELAYED_RELEASE_TABLET | Freq: Every day | ORAL | Status: DC
  Administered 2019-11-27 – 2019-11-29 (×3): 81 mg via ORAL
  Filled 2019-11-27 (×3): qty 1

## 2019-11-27 MED ORDER — ENOXAPARIN SODIUM 40 MG/0.4ML ~~LOC~~ SOLN
40.0000 mg | SUBCUTANEOUS | Status: DC
  Administered 2019-11-27 – 2019-11-28 (×2): 40 mg via SUBCUTANEOUS
  Filled 2019-11-27 (×2): qty 0.4

## 2019-11-27 MED ORDER — FOLIC ACID 1 MG PO TABS
1.0000 mg | ORAL_TABLET | Freq: Every day | ORAL | Status: DC
  Administered 2019-11-27 – 2019-11-29 (×2): 1 mg via ORAL
  Filled 2019-11-27 (×2): qty 1

## 2019-11-27 NOTE — H&P (Addendum)
Family Medicine Teaching [facility] Admission History and Physical Service Pager: [phone]  Patient name: Ryan Montgomery Medical record number: [id number] Date of birth: 21-Nov-1955 Age: 64 y.o. Gender: male  Primary Care Provider: Nelwyn Salisbury, MD Consultants: none Code Status: Full Preferred Emergency Contact: Ryan Montgomery (spouse) [phone]  Chief Complaint: Syncopal epsiode  Assessment and Plan: Ryan Montgomery is a 64 y.o. male presenting with syncopal episode. PMH is significant for treated hepatitis C and gout, h/o stroke.  Syncope vs Seizure Patient admitted on 11/27/2019 after having an episode of jerking movements of extremities and urinary incontinence with loss of consciousness witnessed by wife, daughter, and party guests. In the ED, CMP and a CBC were  within normal limits except Glucose 101, Creatinine 1.27, Total protein 8.4, GFR 60, RBC 4.08 and hemoglobin 12.8. UA was positive for THC. Lipid panel demonstrated low HDL of 32 and high LDL of 229. Troponin, TSH and Hemoglobin A1C were all within normal limits. EKG performed showed sinus rhythm. Given the nature of his symptoms during this event, differentials on our list include but are not limited to syncope (possibly vasovagal in nature), arrhythmia, seizure, stroke and manifestation of alcohol and substance abuse. Lower on the list, still consider MI, hypoglycemia and thyroid disorder. Patient and wife deny history of seizures but considering the jerking movements and urinary incontinence, a seizure cannot be eliminated, therefore an EEG has been ordered. The factor that counts against seizure is there was no post-ictal state.  Chest x-ray showed no active disease.  CT head demonstrated no intracranial abnormality and neurological exam was unremarkable allowing for a less suspicion of stroke or mass. Patient's wife states that these episodes in the past have correlated with alcohol use and patient admits to substance use  earlier today which could have affected his symptoms. Although, patient endorses no alcohol use in over 7 months. CIWA was included in the workup as a precaution for previous alcohol abuse. UDS positive for THC. Patient endorsed that he takes codeine every morning for his pain, his wife said he takes oxycodone every evening but his UDS was negative for opiates so this was likely not contributing to his symptoms. Vasovagal or orthostatic syncope is high on the differential since patient admittedly did not eat or drink anything for the day, but orthostatic vitals were negative on presentation. The nature of the episode having no prodrome and no post-ictal period leaves suspicion open for cardiac arrhythmia or valvular disease but patient had no chest pain/SOB and ECG/troponins normal. an echocardiogram has been ordered as well. Considering troponin levels were normal and symptoms don't correlate, MI is low on the list of the likely diagnosis. The fact that TSH is normal, patient has no diabetic history and an A1c of 4.9  which makes thyroid disorder and hypoglycemia less of a concern. Unlikely to be due to PE with Wells score 0 and no respiratory symptoms. -Admit to med telemetry, Dr. Leveda Anna - continuous cardiac monitoring -Order EEG, may consider neurology consult if further workup suggests seizure -Head CT showed no intracranial abnormality. But may consider MRI to further rule out stroke although no focal findings on neurological exam -Awaiting repeat EKG results -f/u echo -Continue to monitor for new symptoms - monitor CIWAs - consider neuro consult - continue ASA  Hepatitis C Resolved and stable. Both patient and his wife endorse history of hepatitis C. Patient has completed the  8 month course of therapy. Currently not on any medications for this.   -Consider follow-up  as outpatient  Gout- patient is on unknown medication. He thinks it ends in a stat. Likely febuxistat.  - continue to  monitor  FEN/GI: NPO pending workup Prophylaxis: Lovenox  Disposition: inpatient pending workup  History of Present Illness:  Ryan Montgomery is a 64 y.o. male presenting with syncopal episode earlier this afternoon. Patient states that his symptoms began earlier today when he was at a cookout with family. He was grilling and then sat in a chair in direct sunlight for a while and got hot so moved to another chair in the shade and felt like his normal self. The next thing he remembered is his daughter suddenly telling him to not stand up since according to family he was passed out and slumped over in his chair. Patient endorses that he has no recollection of this event and states multiple times that all he recalls is his daughter telling him to stay seated in his chair. Family members conveyed to the patient that he was shaking in his chair and experienced some urinary incontinence. He is unsure about the duration of the event but with the patient's permission, I spoke to his wife. Wife states that she observed jerking movements that lasted for about 5-10 minutes along with drooling and urinary incontinence. He denies any prodrome symptoms. Patient also denies any associated falls or loss of consciousness, he states that immediately after this episode, he was aware of his surroundings. Family also supports that finding. He denies any history of seizures or headaches. Although patient's wife states that he appeared slightly disoriented immediately following the event. Patient describes the entire episode as feeling like a blink of an eye. He also admits that he did not have any meals all day to save room for eating at the cookout. He admits to occasional marijuana use, most recently earlier this afternoon around 2 PM. He admits to smoking cigarettes- a pack per 3 days. Denies any alcohol use for >7 months but has history of alcohol abuse. He endorses taking one codeine every morning for hip pain along with  tylenol and ASA 81mg . Patient states the he feels completely normal and back to baseline at the time of the visit.  During my telephone conversation with the wife, she states that she has observed similar episodes int he past where he experienced twitching movements but this time she noticed more jerking. Wife thinks it may be related to patient's previous history of alcohol abuse and questioning if patient has started alcohol consumption again. Wife admits to patient's history of previous stroke about 8 years ago and that he takes oxycodone every night. Patient denies any alcohol use since November. Patient admits to having a syncopal episode in April when he passed out and fractured his pelvis and required surgery. He has stopped drinking since then. Wife shares that patient was diagnosed with liver disease and hepatitis C which he was treated for Hep C with 8 months of treatment. Surgical history includes tonsilectomy in 1970s ankle fixation in the 1980s and surgery for hip fracture in April 2021. Current medications include low dose ASA, tylenol and codeine.     Review Of Systems: Per HPI with the following additions:   Review of Systems  Constitutional: Negative for fatigue.  HENT: Positive for drooling.   Eyes: Negative for visual disturbance.  Respiratory: Negative for shortness of breath.   Cardiovascular: Negative for chest pain.  Gastrointestinal: Negative for abdominal pain, nausea and vomiting.  Musculoskeletal: Negative for arthralgias and gait problem (Ambulates  with assistance of walker or cane at baseline).  Neurological: Negative for dizziness, weakness and numbness.  Psychiatric/Behavioral: Positive for confusion.    Patient Active Problem List   Diagnosis Date Noted  . Hepatitis C 07/18/2013  . CELLULITIS, FOOT 11/13/2007  . TINEA PEDIS 04/28/2007  . CHICKENPOX, HX OF 04/28/2007    Past Medical History: Past Medical History:  Diagnosis Date  . Chickenpox   .  Chickenpox   . Hepatitis C    Past Surgical History: Past Surgical History:  Procedure Laterality Date  . colonoscopy  08-28-08   per Dr. Jarold Motto, clear, repeat in 10 yrs   . fx rt ankle     pins placed  . JOINT REPLACEMENT Left 09/12/2019   Social History: Social History   Tobacco Use  . Smoking status: Current Every Day Smoker    Packs/day: 1.00    Types: Cigarettes  . Smokeless tobacco: Never Used  Substance Use Topics  . Alcohol use: Yes    Comment: mixed drink daily  . Drug use: No   Please also refer to relevant sections of EMR.  Family History: Family History  Problem Relation Age of Onset  . Hypertension Other   . Lung cancer Other    Allergies and Medications: Allergies  Allergen Reactions  . Moxifloxacin     REACTION: hives   No current facility-administered medications on file prior to encounter.   Current Outpatient Medications on File Prior to Encounter  Medication Sig Dispense Refill  . diclofenac (VOLTAREN) 75 MG EC tablet Take 1 tablet (75 mg total) by mouth 2 (two) times daily as needed for moderate pain. (Patient not taking: Reported on 06/05/2018) 60 tablet 5    Objective: BP 138/84 (BP Location: Right Arm)   Pulse 84   Temp 98.2 F (36.8 C) (Oral)   Resp 18   Ht 5\' 11"  (1.803 m)   Wt 81.6 kg   SpO2 97%   BMI 25.10 kg/m  Exam: General: Patient is pleasant and in no acute distress Eyes: Pupils equal, round and reactive to light. Extraocular movements intact. Neck: No JVD noted Cardiovascular: Regular rate and rhythm, no murmurs or gallops noted Respiratory: Lungs clear to auscultation bilaterally, no wheezing or rhonchi noted Gastrointestinal: Soft, nontender on palpation, nondistended, bowel sounds present  MSK: Distal pulses intact, no lower extremity edema noted Derm: warm and dry to touch Neuro: CN 2-12 grossly intact, 5/5 strength and sensation grossly intact in all extremities, cerebellar (finger to nose and heel to shin)  testing normal. Able to stand independently and negative romberg test. DTRs normal Psych: Appropriate mood and affect  Labs and Imaging: CBC BMET  Recent Labs  Lab 11/27/19 1812  WBC 8.7  HGB 12.8*  HCT 40.2  PLT 181   Recent Labs  Lab 11/27/19 1812  NA 140  K 4.0  CL 107  CO2 24  BUN 13  CREATININE 1.27*  GLUCOSE 101*  CALCIUM 9.2     EKG: NSR  CT Head Wo Contrast  Result Date: 11/27/2019 CLINICAL DATA:  Seizure. EXAM: CT HEAD WITHOUT CONTRAST TECHNIQUE: Contiguous axial images were obtained from the base of the skull through the vertex without intravenous contrast. COMPARISON:  None. FINDINGS: Brain: There is mild central and cortical atrophy. Periventricular white matter changes are consistent with small vessel disease. There is encephalomalacia in the superior RIGHT frontal lobe consistent with remote infarct. Remote infarct identified within the LEFT caudate nucleus. There is no intra or extra-axial fluid collection or  mass lesion. The basilar cisterns and ventricles have a normal appearance. There is no CT evidence for acute infarction or hemorrhage. Vascular: There is atherosclerotic calcification of the internal carotid arteries. No hyperdense vessels. Skull: Normal. Negative for fracture or focal lesion. Sinuses/Orbits: Small RIGHT mastoid effusion. Air-fluid levels within both maxillary sinuses. Orbits are unremarkable. Other: None IMPRESSION: 1. No evidence for acute intracranial abnormality. 2. Atrophy and small vessel disease. 3. Encephalomalacia in the superior RIGHT frontal lobe consistent with remote infarct. 4. Remote infarct of the LEFT caudate nucleus. 5. Small RIGHT mastoid effusion. 6. Bilateral possibly acute maxillary sinusitis. Electronically Signed   By: Norva Pavlov M.D.   On: 11/27/2019 18:34   Portable chest 1 View  Result Date: 11/27/2019 CLINICAL DATA:  Syncope EXAM: PORTABLE CHEST 1 VIEW COMPARISON:  None. FINDINGS: The heart size and mediastinal  contours are within normal limits. Both lungs are clear. The visualized skeletal structures are unremarkable. IMPRESSION: No active disease. Electronically Signed   By: Romona Curls M.D.   On: 11/27/2019 21:48    Reece Leader, DO 11/27/2019, 8:10 PM PGY-1, [facility] FPTS Intern pager: [phone], text pages welcome  RESIDENT ATTESTATION    I have seen and examined this patient.     I have discussed the findings and exam with the intern and agree with the above note, which I have edited appropriately in BLUE. I helped develop the management plan that is described in the resident's note, and I agree with the content.   Jamelle Rushing, DO PGY-3 Family Medicine Resident

## 2019-11-27 NOTE — ED Notes (Signed)
Pt reminded of the need for urine.  

## 2019-11-27 NOTE — ED Triage Notes (Signed)
Pt bib by [facility] from home. Pt family found him sitting on the deck, slightly slumped over and shaking, pt had episode of incontinence. Pt does not recall episode.  Unclear previous hx of stroke possibly 5 yrs prior. No hx of seizures. Stroke screen neg. Pt tachy in 130's, given NS, hr decreased to 104. cbg 117 154/84 98% RA

## 2019-11-27 NOTE — ED Notes (Signed)
Gunnar Fusi (Wife [phone]).

## 2019-11-27 NOTE — ED Provider Notes (Addendum)
[facility] EMERGENCY DEPARTMENT Provider Note   CSN: [id number] Arrival date & time: 11/27/19  1701     History Chief Complaint  Patient presents with  . Loss of Consciousness  . Seizures    Ryan Montgomery is a 64 y.o. male.  HPI 63 year old male presents for possible seizure.  History is limited as the patient does remember exactly what happened and no family is currently available.  Apparently he was with family doing a cookout and went and took a nap and when he came back to go help out he then sat down in a chair and family states that his head started bobbing back and forth and he was unresponsive.  The patient has never had a prior history of seizures.  He had no preceding symptoms.  Right now he feels fine.  Used to abuse alcohol but has stopped that for several months.  No headache.  No tongue injury.  He did have urinary incontinence.  I got further history from the wife over the phone.  The patient was sitting in a chair and all of a sudden seemed to slump in the chair.  Shortly thereafter he had "shaking" of his upper extremities.  Overall lasted about a minute.  Seemed to be described as more like mild shaking/tremors rather than rhythmic shaking from a seizure.  He was incontinent of urine.  When he woke up he woke up fairly quickly and seemed to come to quickly.  Does not really sound like a postictal period.  Past Medical History:  Diagnosis Date  . Chickenpox   . Chickenpox   . Hepatitis C     Patient Active Problem List   Diagnosis Date Noted  . Hepatitis C 07/18/2013  . CELLULITIS, FOOT 11/13/2007  . TINEA PEDIS 04/28/2007  . CHICKENPOX, HX OF 04/28/2007    Past Surgical History:  Procedure Laterality Date  . colonoscopy  08-28-08   per Dr. Jarold Motto, clear, repeat in 10 yrs   . fx rt ankle     pins placed  . JOINT REPLACEMENT Left 09/12/2019       Family History  Problem Relation Age of Onset  . Hypertension Other   . Lung cancer  Other     Social History   Tobacco Use  . Smoking status: Current Every Day Smoker    Packs/day: 1.00    Types: Cigarettes  . Smokeless tobacco: Never Used  Substance Use Topics  . Alcohol use: Yes    Comment: mixed drink daily  . Drug use: No    Home Medications Prior to Admission medications   Medication Sig Start Date End Date Taking? Authorizing Provider  diclofenac (VOLTAREN) 75 MG EC tablet Take 1 tablet (75 mg total) by mouth 2 (two) times daily as needed for moderate pain. Patient not taking: Reported on 06/05/2018 07/18/13   Nelwyn Salisbury, MD    Allergies    Moxifloxacin  Review of Systems   Review of Systems  Constitutional: Negative for fever.  Respiratory: Negative for shortness of breath.   Cardiovascular: Negative for chest pain.  Gastrointestinal: Negative for abdominal pain and vomiting.  Musculoskeletal: Negative for neck pain.  Neurological: Positive for seizures and syncope. Negative for weakness, numbness and headaches.  All other systems reviewed and are negative.   Physical Exam Updated Vital Signs BP 138/84 (BP Location: Right Arm)   Pulse 84   Temp 98.2 F (36.8 C) (Oral)   Resp 18   Ht 5\' 11"  (  1.803 m)   Wt 81.6 kg   SpO2 97%   BMI 25.10 kg/m   Physical Exam Vitals and nursing note reviewed.  Constitutional:      General: He is not in acute distress.    Appearance: He is well-developed. He is not ill-appearing or diaphoretic.  HENT:     Head: Normocephalic and atraumatic.     Right Ear: External ear normal.     Left Ear: External ear normal.     Nose: Nose normal.     Mouth/Throat:     Comments: No tongue injury Eyes:     General:        Right eye: No discharge.        Left eye: No discharge.     Extraocular Movements: Extraocular movements intact.     Pupils: Pupils are equal, round, and reactive to light.  Cardiovascular:     Rate and Rhythm: Normal rate and regular rhythm.     Heart sounds: Normal heart sounds. No murmur  heard.   Pulmonary:     Effort: Pulmonary effort is normal.     Breath sounds: Normal breath sounds.  Abdominal:     Palpations: Abdomen is soft.     Tenderness: There is no abdominal tenderness.  Musculoskeletal:     Cervical back: Neck supple.  Skin:    General: Skin is warm and dry.  Neurological:     Mental Status: He is alert and oriented to person, place, and time.     Comments: CN 3-12 grossly intact. 5/5 strength in all 4 extremities. Grossly normal sensation. Normal finger to nose.   Psychiatric:        Mood and Affect: Mood is not anxious.     ED Results / Procedures / Treatments   Labs (all labs ordered are listed, but only abnormal results are displayed) Labs Reviewed  COMPREHENSIVE METABOLIC PANEL - Abnormal; Notable for the following components:      Result Value   Glucose, Bld 101 (*)    Creatinine, Ser 1.27 (*)    Total Protein 8.4 (*)    GFR calc non Af Amer 60 (*)    All other components within normal limits  CBC WITH DIFFERENTIAL/PLATELET - Abnormal; Notable for the following components:   RBC 4.08 (*)    Hemoglobin 12.8 (*)    Lymphs Abs 4.9 (*)    All other components within normal limits  SARS CORONAVIRUS 2 BY RT PCR (HOSPITAL ORDER, PERFORMED IN [facility])  MAGNESIUM  RAPID URINE DRUG SCREEN, HOSP PERFORMED    EKG EKG Interpretation  Date/Time:  Sunday November 27 2019 17:16:23 EDT Ventricular Rate:  92 PR Interval:    QRS Duration: 104 QT Interval:  353 QTC Calculation: 437 R Axis:   33 Text Interpretation: Sinus rhythm Short PR interval Interpretation limited secondary to artifact No old tracing to compare Confirmed by Pricilla Loveless [phone]) on 11/27/2019 5:19:56 PM   Radiology CT Head Wo Contrast  Result Date: 11/27/2019 CLINICAL DATA:  Seizure. EXAM: CT HEAD WITHOUT CONTRAST TECHNIQUE: Contiguous axial images were obtained from the base of the skull through the vertex without intravenous contrast. COMPARISON:  None.  FINDINGS: Brain: There is mild central and cortical atrophy. Periventricular white matter changes are consistent with small vessel disease. There is encephalomalacia in the superior RIGHT frontal lobe consistent with remote infarct. Remote infarct identified within the LEFT caudate nucleus. There is no intra or extra-axial fluid collection or mass lesion. The  basilar cisterns and ventricles have a normal appearance. There is no CT evidence for acute infarction or hemorrhage. Vascular: There is atherosclerotic calcification of the internal carotid arteries. No hyperdense vessels. Skull: Normal. Negative for fracture or focal lesion. Sinuses/Orbits: Small RIGHT mastoid effusion. Air-fluid levels within both maxillary sinuses. Orbits are unremarkable. Other: None IMPRESSION: 1. No evidence for acute intracranial abnormality. 2. Atrophy and small vessel disease. 3. Encephalomalacia in the superior RIGHT frontal lobe consistent with remote infarct. 4. Remote infarct of the LEFT caudate nucleus. 5. Small RIGHT mastoid effusion. 6. Bilateral possibly acute maxillary sinusitis. Electronically Signed   By: Norva Pavlov M.D.   On: 11/27/2019 18:34    Procedures Procedures (including critical care time)  Medications Ordered in ED Medications  sodium chloride 0.9 % bolus 1,000 mL (1,000 mLs Intravenous New Bag/Given 11/27/19 1952)    ED Course  I have reviewed the triage vital signs and the nursing notes.  Pertinent labs & imaging results that were available during my care of the patient were reviewed by me and considered in my medical decision making (see chart for details).    MDM Rules/Calculators/A&P                          The history obtained from wife sounds more like syncope rather than seizure.  He was not postictal according to her.  Given he had no prodrome, I think it would be beneficial for him to be admitted. Seizure is in the differential but felt to be less likely.  Discussed with family  practice.  Work-up fairly unremarkable at this point. Final Clinical Impression(s) / ED Diagnoses Final diagnoses:  Syncope and collapse    Rx / DC Orders ED Discharge Orders    None       Pricilla Loveless, MD 11/27/19 2045    Pricilla Loveless, MD 11/27/19 2046

## 2019-11-28 ENCOUNTER — Inpatient Hospital Stay (HOSPITAL_COMMUNITY): Payer: No Typology Code available for payment source

## 2019-11-28 DIAGNOSIS — R55 Syncope and collapse: Principal | ICD-10-CM

## 2019-11-28 LAB — CBC
HCT: 38.8 % — ABNORMAL LOW (ref 39.0–52.0)
Hemoglobin: 12.6 g/dL — ABNORMAL LOW (ref 13.0–17.0)
MCH: 32 pg (ref 26.0–34.0)
MCHC: 32.5 g/dL (ref 30.0–36.0)
MCV: 98.5 fL (ref 80.0–100.0)
Platelets: 184 10*3/uL (ref 150–400)
RBC: 3.94 MIL/uL — ABNORMAL LOW (ref 4.22–5.81)
RDW: 12.7 % (ref 11.5–15.5)
WBC: 11.3 10*3/uL — ABNORMAL HIGH (ref 4.0–10.5)
nRBC: 0 % (ref 0.0–0.2)

## 2019-11-28 LAB — ECHOCARDIOGRAM COMPLETE
Height: 71 in
Weight: 3006.4 oz

## 2019-11-28 LAB — LIPID PANEL
Cholesterol: 167 mg/dL (ref 0–200)
HDL: 32 mg/dL — ABNORMAL LOW (ref >40)
LDL Cholesterol: 115 mg/dL — ABNORMAL HIGH (ref 0–99)
Total CHOL/HDL Ratio: 5.2 RATIO
Triglycerides: 101 mg/dL (ref <150)
VLDL: 20 mg/dL (ref 0–40)

## 2019-11-28 LAB — PHOSPHORUS: Phosphorus: 3.7 mg/dL (ref 2.5–4.6)

## 2019-11-28 LAB — BASIC METABOLIC PANEL
Anion gap: 7 (ref 5–15)
BUN: 13 mg/dL (ref 8–23)
CO2: 23 mmol/L (ref 22–32)
Calcium: 9 mg/dL (ref 8.9–10.3)
Chloride: 111 mmol/L (ref 98–111)
Creatinine, Ser: 1.09 mg/dL (ref 0.61–1.24)
GFR calc Af Amer: >60 mL/min (ref >60)
GFR calc non Af Amer: >60 mL/min (ref >60)
Glucose, Bld: 106 mg/dL — ABNORMAL HIGH (ref 70–99)
Potassium: 3.9 mmol/L (ref 3.5–5.1)
Sodium: 141 mmol/L (ref 135–145)

## 2019-11-28 LAB — HIV ANTIBODY (ROUTINE TESTING W REFLEX): HIV Screen 4th Generation wRfx: NONREACTIVE

## 2019-11-28 LAB — HEMOGLOBIN A1C
Hgb A1c MFr Bld: 4.9 % (ref 4.8–5.6)
Mean Plasma Glucose: 93.93 mg/dL

## 2019-11-28 LAB — CREATININE, SERUM
Creatinine, Ser: 1.05 mg/dL (ref 0.61–1.24)
GFR calc Af Amer: >60 mL/min (ref >60)
GFR calc non Af Amer: >60 mL/min (ref >60)

## 2019-11-28 LAB — TSH: TSH: 3.493 u[IU]/mL (ref 0.350–4.500)

## 2019-11-28 LAB — TROPONIN I (HIGH SENSITIVITY)
Troponin I (High Sensitivity): 5 ng/L (ref <18)
Troponin I (High Sensitivity): 5 ng/L (ref <18)

## 2019-11-28 IMAGING — MR MR HEAD WO/W CM
9 of 14 series · 32 of 48 positions shown · IV contrast (gadavist)
Comparison: Head CT [DATE].

CLINICAL DATA: 64-year-old male with possible syncope versus
seizure.

EXAM:
MRI HEAD WITHOUT AND WITH CONTRAST
TECHNIQUE: Multiplanar, multiecho pulse sequences of the brain and surrounding
structures were obtained without and with intravenous contrast.
CONTRAST:  8mL GADAVIST GADOBUTROL 1 MMOL/ML IV SOLN

[Series 4: DWI · axial · 3.0mm · 1.09mm/px · z∈[-46,+109]mm · 7 of 106 slices shown (1 of 4)]
[im 1/106]
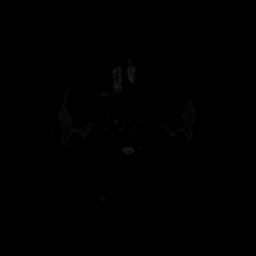
[im 18/106]
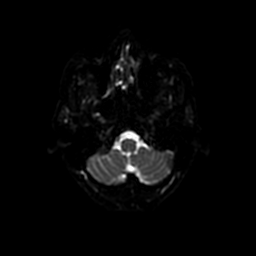
[im 36/106]
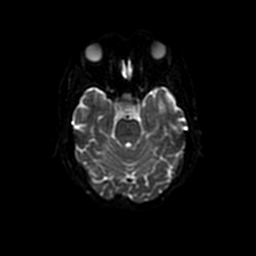
[im 53/106]
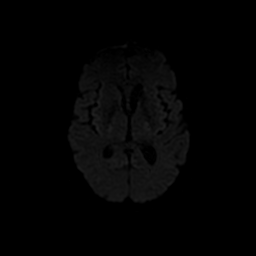
[im 71/106]
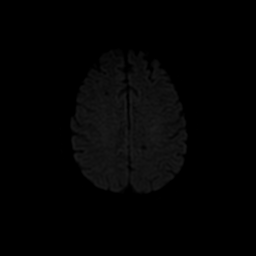
[im 88/106]
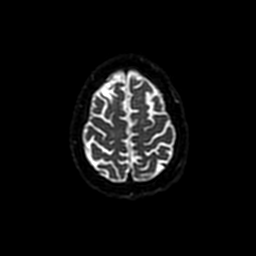
[im 106/106]
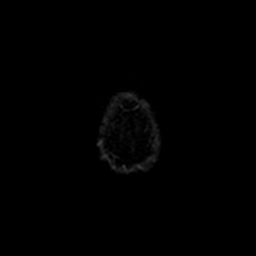

[Series 5: DWI · coronal · 5.0mm · 1.09mm/px · 6 of 80 slices shown (2 of 4)]
[im 1/80]
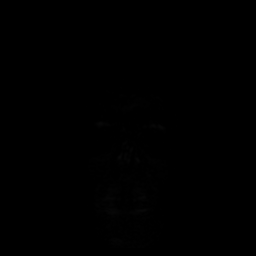
[im 16/80]
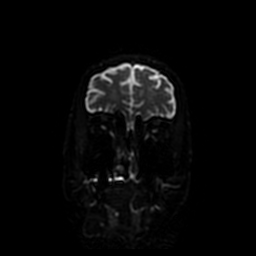
[im 32/80]
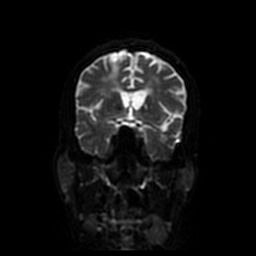
[im 48/80]
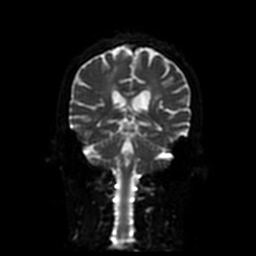
[im 64/80]
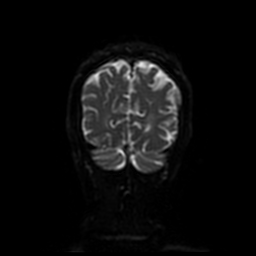
[im 80/80]
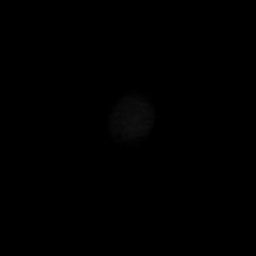

[Series 7: T2 · axial · 5.0mm · 0.43mm/px · z∈[-43,+112]mm · 2 of 27 slices shown]
[im 1/27]
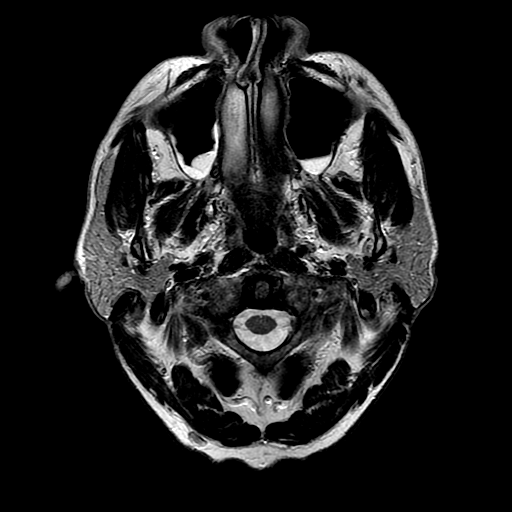
[im 27/27]
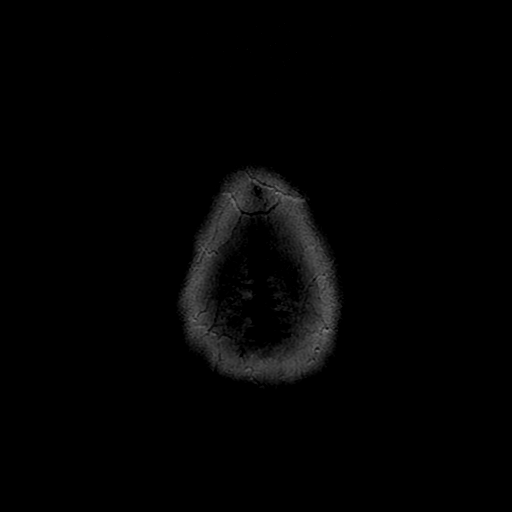

[Series 10: FLAIR · coronal · 3.0mm · 0.37mm/px · 2 of 34 slices shown (1 of 2)]
[im 1/34]
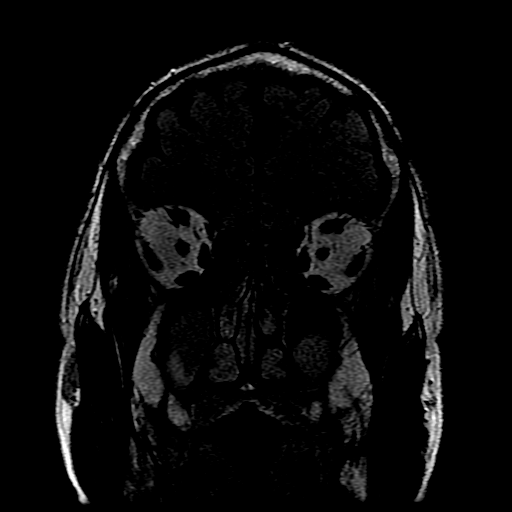
[im 34/34]
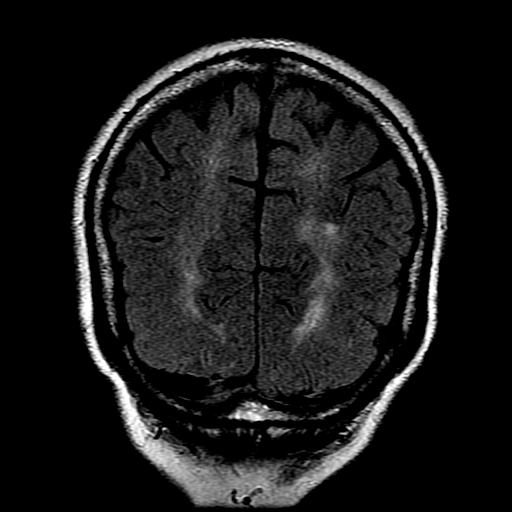

[Series 11: FLAIR · axial · 5.0mm · 0.43mm/px · z∈[-43,+112]mm · 2 of 27 slices shown (2 of 2)]
[im 1/27]
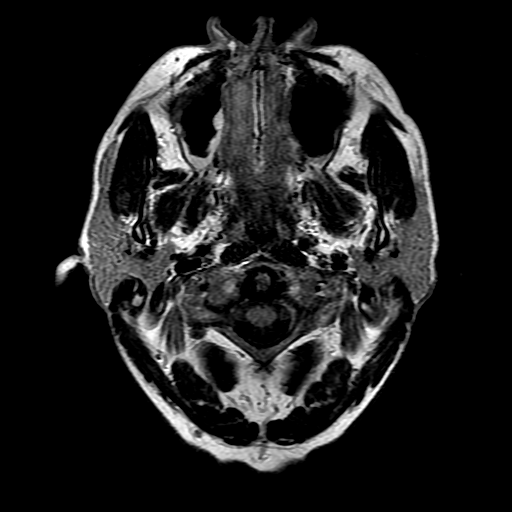
[im 27/27]
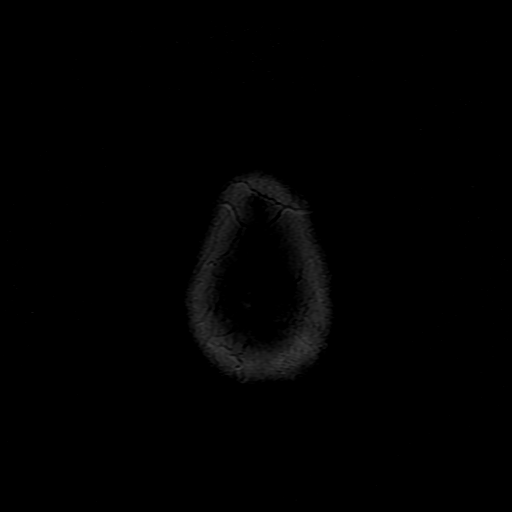

[Series 15: T1 post-contrast · axial · 3.0mm · 0.45mm/px · z∈[-55,+109]mm · 4 of 56 slices shown (1 of 2)]
[im 1/56]
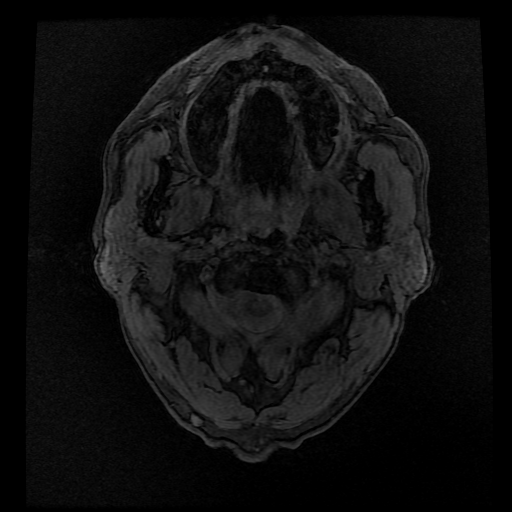
[im 19/56]
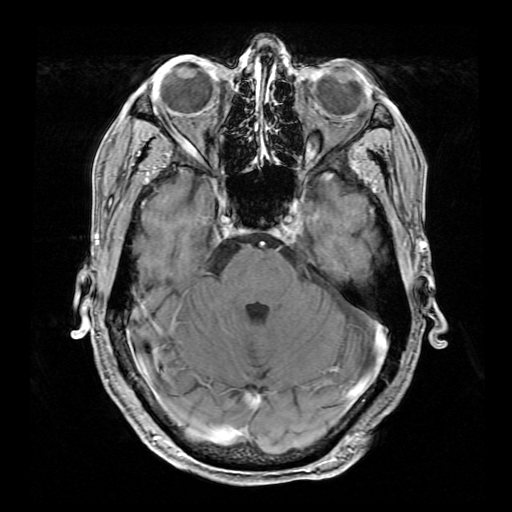
[im 37/56]
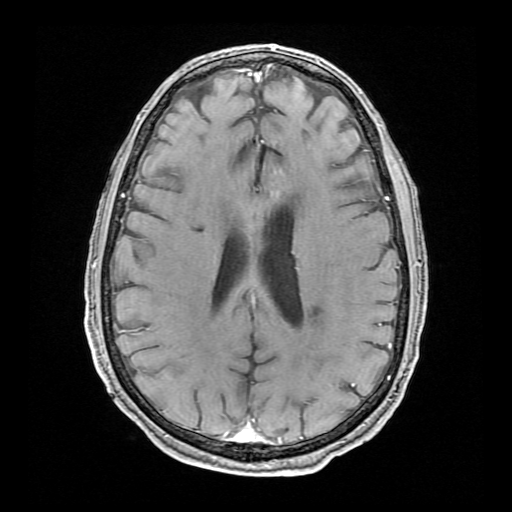
[im 56/56]
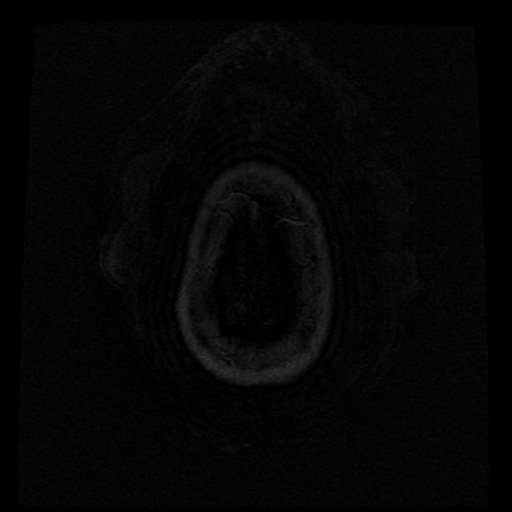

[Series 16: T1 post-contrast · coronal · 5.0mm · 0.39mm/px · 2 of 25 slices shown (2 of 2)]
[im 1/25]
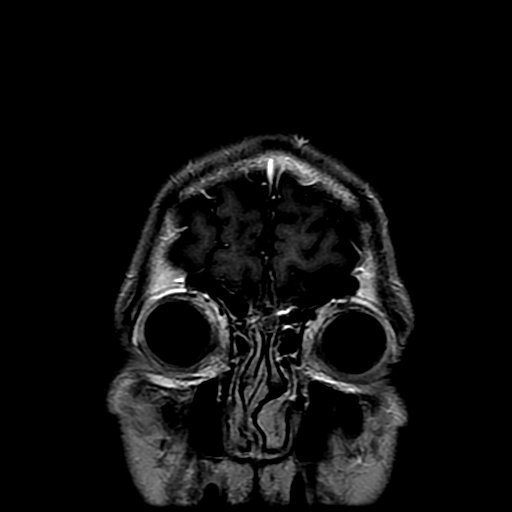
[im 25/25]
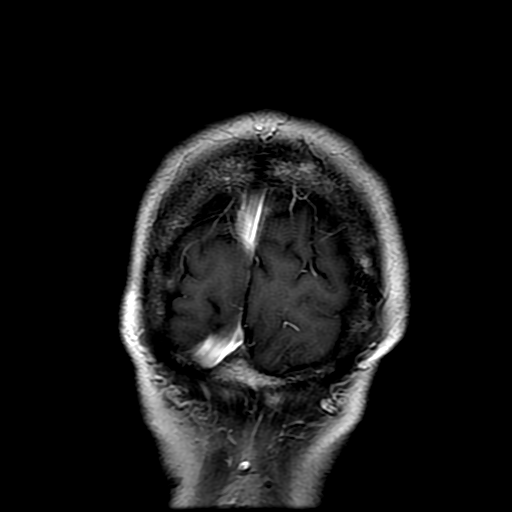

[Series 400: DWI · axial · 3.0mm · 1.09mm/px · z∈[-46,+109]mm · 4 of 53 slices shown (3 of 4)]
[im 1/53]
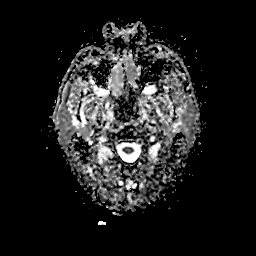
[im 18/53]
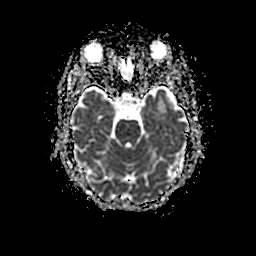
[im 35/53]
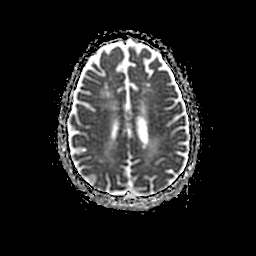
[im 53/53]
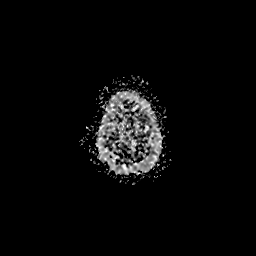

[Series 500: DWI · coronal · 5.0mm · 1.09mm/px · 3 of 40 slices shown (4 of 4)]
[im 1/40]
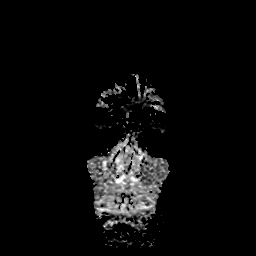
[im 20/40]
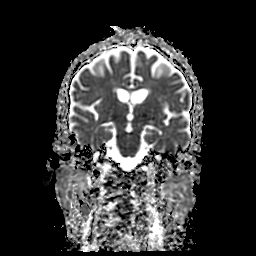
[im 40/40]
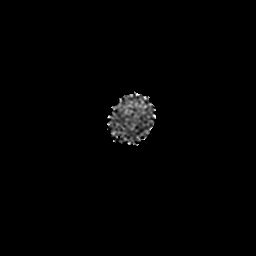

[32 of 48 positions shown; findings below may reference images not displayed]

FINDINGS: Brain: No convincing restricted diffusion to suggest acute
infarction. No midline shift, mass effect, evidence of mass lesion,
ventriculomegaly, extra-axial collection or acute intracranial
hemorrhage. Cervicomedullary junction and pituitary are within
normal limits.

There are multiple small areas of chronic cortical encephalomalacia
in the anterior frontal lobes (series 11, image 16) and also the
anterior superior right frontal gyrus (series 11, image 23). No
associated hemosiderin.

There is superimposed advanced, confluent, and also nodular
bilateral periventricular white matter T2 and FLAIR hyperintensity,
some of which is oriented perpendicular to the ventricles and with
areas of cystic encephalomalacia. There is some temporal lobe
involvement greater on the left. Corpus callosum volume seems to be
maintained. There is deep white matter capsule involvement on the
right.

And there is superimposed T2 heterogeneity in the left caudate which
more resembles chronic small vessel disease. The brainstem and
cerebellum appear spared, with no chronic cerebral blood products
identified.

Following contrast No abnormal enhancement identified. No dural
thickening.

Additional thin slice coronal imaging of the temporal lobes is
provided. The hippocampal formations appear symmetric and within
normal limits on series 9, image 14. Other mesial temporal lobe
structures appear normal.

Vascular: Major intracranial vascular flow voids are preserved. The
major dural venous sinuses are enhancing and appear to be patent.

Skull and upper cervical spine: Negative visible cervical spine,
bone marrow signal.

Sinuses/Orbits: Negative orbits. Trace paranasal sinus fluid or
mucosal thickening is stable from yesterday.

Other: Mastoids are well pneumatized. Visible internal auditory
structures appear normal. Scalp and face soft tissues appear
negative.
IMPRESSION: 1. No acute intracranial abnormality.

2. A combination of findings are identified. Some most resemble
sequelae of remote trauma (anterior bifrontal encephalomalacia), and
also chronic small-vessel ischemia (chronic left caudate lacune).
However, superimposed advanced bilateral white matter disease has a
configuration suspicious for demyelination due to Multiple
Sclerosis.
The white matter changes could alternatively also reflect small
vessel disease, and the brainstem and cerebellum appear spared.
No abnormal enhancement or chronic cerebral blood products.

3. Dedicated seizure imaging without evidence of mesial temporal
sclerosis.

## 2019-11-28 MED ORDER — IBUPROFEN 600 MG PO TABS: 600.0000 mg | ORAL_TABLET | Freq: Four times a day (QID) | ORAL | Status: DC | PRN

## 2019-11-28 MED ORDER — GADOBUTROL 1 MMOL/ML IV SOLN
8.0000 mL | Freq: Once | INTRAVENOUS | Status: AC | PRN
  Administered 2019-11-28: 8 mL via INTRAVENOUS

## 2019-11-28 MED ORDER — LEVETIRACETAM 500 MG PO TABS
500.0000 mg | ORAL_TABLET | Freq: Two times a day (BID) | ORAL | Status: DC
  Administered 2019-11-28 – 2019-11-29 (×2): 500 mg via ORAL
  Filled 2019-11-28 (×2): qty 1

## 2019-11-28 MED ORDER — OXYCODONE HCL 5 MG PO TABS
5.0000 mg | ORAL_TABLET | Freq: Three times a day (TID) | ORAL | Status: DC | PRN
  Administered 2019-11-28: 5 mg via ORAL
  Filled 2019-11-28: qty 1

## 2019-11-28 NOTE — Progress Notes (Addendum)
Family Medicine Teaching Service Daily Progress Note Intern Pager: [phone]  Patient name: Ryan Montgomery Medical record number: [id number] Date of birth: Nov 05, 1955 Age: 64 y.o. Gender: male  Primary Care Provider: Nelwyn Salisbury, MD Consultants: Neuro Code Status: Full  Pt Overview and Major Events to Date:  Patient is currently hospital day #1, presented with suspected syncopal episode.  No history of seizures.  CT head negative, chest x-ray negative, EEG pending, echo pending.  Chief Complaint: Syncopal epsiode  Assessment and Plan: Ryan Montgomery is a 64 y.o. male presenting with suspected syncopal episode.  PMHx significant for CVA in 2018, gout, and treated hepatitis C.    Syncope vs Seizure WBC 11.6 > 11.3.  Hemoglobin 12.9 > 12.6.  A1c 4.9.  EKG shows normal sinus rhythm, normal axis, no QT prolongation, appropriate R wave progression. CT head demonstrates encephalomalacia in the superior right frontal lobe consistent with remote infarct, and remote infarct within the left caudate nucleus.  No CT evidence for acute infarction or hemorrhage.  EEG normal, with no focal lateralizing or epileptiform features.  TSH within normal limits, Covid negative, HIV negative.  UDS positive only for cannabis.  Remote history of alcohol abuse, however has not drank in over 7 months.  Wells score 0. -Follow-up on MRI brain study -Follow-up on echo -Follow-up on repeat EKG -Continue cardiac monitoring -Monitor CIWA's -Neuro consulted, appreciate recs -Continue aspirin  Gout Patient is on unknown medication. He thinks it ends in a stat. Likely febuxistat.  - continue to monitor  Hepatitis C Resolved and stable. Both patient and his wife endorse history of hepatitis C. Patient has completed the  8 month course of therapy. Currently not on any medications for this.   -Consider follow-up as outpatient  FEN/GI: NPO pending workup Prophylaxis: Lovenox  Subjective:  Ryan Montgomery was found  lying awake in bed this morning upon entering his room.  He was alert and conversational.  He has no complaints, saying he feels normal.  He denies any recollection of symptoms prior to or after his suspected syncopal episode yesterday.  He says he has felt fine since then.  No headaches, dizziness, changes in vision, fever, chills, nausea or vomiting.  No changes in sensation in any extremities.  Objective: Temp:  [97.2 F (36.2 C)-98.4 F (36.9 C)] 98.1 F (36.7 C) (07/05 1220) Pulse Rate:  [66-89] 71 (07/05 1220) Resp:  [18-19] 18 (07/05 1220) BP: (127-153)/(70-88) 138/71 (07/05 1220) SpO2:  [96 %-97 %] 96 % (07/05 1220) Weight:  [81.6 kg-85.2 kg] 85.2 kg (07/05 0504) Physical Exam: General: Awake, alert, pleasant, oriented X4 Cardiovascular: Regular rate and rhythm auscultated, S1 and S2 present, no murmurs or gallop appreciated Respiratory: Lungs are clear to auscultation bilaterally in all fields Abdomen: Bowel sounds present, abdomen soft, no tenderness to palpation or rebound tenderness Extremities: No edema in lower extremities  Laboratory: Recent Labs  Lab 11/27/19 1812 11/27/19 2119 11/28/19 0406  WBC 8.7 11.6* 11.3*  HGB 12.8* 12.9* 12.6*  HCT 40.2 41.4 38.8*  PLT 181 184 184   Recent Labs  Lab 11/27/19 1812 11/27/19 2119 11/28/19 0406  NA 140  --  141  K 4.0  --  3.9  CL 107  --  111  CO2 24  --  23  BUN 13  --  13  CREATININE 1.27* 1.05 1.09  CALCIUM 9.2  --  9.0  PROT 8.4*  --   --   BILITOT 0.5  --   --  ALKPHOS 77  --   --   ALT 12  --   --   AST 18  --   --   GLUCOSE 101*  --  106*   Imaging/Diagnostic Tests: CT Head Wo Contrast  Result Date: 11/27/2019 CLINICAL DATA:  Seizure. EXAM: CT HEAD WITHOUT CONTRAST TECHNIQUE: Contiguous axial images were obtained from the base of the skull through the vertex without intravenous contrast. COMPARISON:  None. FINDINGS: Brain: There is mild central and cortical atrophy. Periventricular white matter changes  are consistent with small vessel disease. There is encephalomalacia in the superior RIGHT frontal lobe consistent with remote infarct. Remote infarct identified within the LEFT caudate nucleus. There is no intra or extra-axial fluid collection or mass lesion. The basilar cisterns and ventricles have a normal appearance. There is no CT evidence for acute infarction or hemorrhage. Vascular: There is atherosclerotic calcification of the internal carotid arteries. No hyperdense vessels. Skull: Normal. Negative for fracture or focal lesion. Sinuses/Orbits: Small RIGHT mastoid effusion. Air-fluid levels within both maxillary sinuses. Orbits are unremarkable. Other: None IMPRESSION: 1. No evidence for acute intracranial abnormality. 2. Atrophy and small vessel disease. 3. Encephalomalacia in the superior RIGHT frontal lobe consistent with remote infarct. 4. Remote infarct of the LEFT caudate nucleus. 5. Small RIGHT mastoid effusion. 6. Bilateral possibly acute maxillary sinusitis. Electronically Signed   By: Norva Pavlov M.D.   On: 11/27/2019 18:34   Portable chest 1 View  Result Date: 11/27/2019 CLINICAL DATA:  Syncope EXAM: PORTABLE CHEST 1 VIEW COMPARISON:  None. FINDINGS: The heart size and mediastinal contours are within normal limits. Both lungs are clear. The visualized skeletal structures are unremarkable. IMPRESSION: No active disease. Electronically Signed   By: Romona Curls M.D.   On: 11/27/2019 21:48     Fayette Pho, MD 11/28/2019, 12:39 PM PGY-1, [facility] Family Medicine FPTS Intern pager: [phone], text pages welcome

## 2019-11-28 NOTE — Plan of Care (Signed)

## 2019-11-28 NOTE — Progress Notes (Signed)
EEG complete - results pending 

## 2019-11-28 NOTE — Care Management (Signed)
11-28-19 Patient presented with a syncopal episode. Prior to arrival patient was from home with spouse. Patient states he is active with [facility] for Physical Therapy Services- patient will need resumption orders for therapy. Patient uses the [facility] for his medications which are free. Patient has a Scientist, forensic [address] Case Manager did call [facility] to make them aware that the patient has been hospitalized. [facility] and should resume tomorrow. No home needs identified at this time. Gala Lewandowsky, RN,BSN Case Manager

## 2019-11-28 NOTE — Hospital Course (Addendum)
Ryan Montgomery is a 64 y.o. male presenting with suspected syncopal episode.  PMHx significant for CVA in 2018, gout, and treated hepatitis C.     Syncope vs Seizure WBC 11.6 > 11.3.  Hemoglobin 12.9 > 12.6.  A1c 4.9. TSH within normal limits, Covid negative, HIV negative. UDS positive only for cannabis.  Remote history of alcohol abuse, however has not drank in over 7 months. Wells score 0.  Neurology recommends starting Keppra 500 mg twice daily, with a follow-up to outpatient neurology in 1 to 2 weeks.  No driving until seizure-free for 6 months.   Admission EKG  (7/4) showed sinus rhythm, read complicated by artifact.  Axis, normal R wave progression.  No ST elevation.  Narrow QRS complex. Repeat EKG (7/4) showed normal sinus rhythm, normal axis, no ST elevation, no QT prolongation, appropriate R wave progression. CT head (7/4) demonstrated encephalomalacia in the superior right frontal lobe consistent with remote infarct, and remote infarct within the left caudate nucleus.  No CT evidence for acute infarction or hemorrhage.   EEG (7/5) normal, with no focal lateralizing or epileptiform features.   ECHO (7/5) showed LV ejection fraction 60 to 65%.  Moderate left ventricular hypertrophy.  Otherwise normal. MRI brain w+wo (7/5) showed a combination of findings. No acute intracranial abnormality. "Some most resemble sequelae of remote trauma (anterior bifrontal encephalomalacia), and also chronic small-vessel ischemia (chronic left caudate lacune). However, superimposed advanced bilateral white matter disease has a configuration suspicious for demyelination due to Multiple Sclerosis. The white matter changes could alternatively also reflect small vessel disease, and the brainstem and cerebellum appear spared."   Gout Patient is on unknown medication. He thinks it ends in a "-stat". Likely febuxistat.     Hepatitis C Resolved and stable. Both patient and his wife endorse history of hepatitis C.  Patient has completed the  8 month course of therapy. Currently not on any medications for this.    Follow up recommendations: Started on Keppra 500mg  BID. Encourage patient to continue taking. Reiterate [place] DMV statutes post seizure.  Needs Neurology follow up. Consider starting statin therapy. LDL 115.

## 2019-11-28 NOTE — Discharge Summary (Addendum)
Family Medicine Teaching [facility] Discharge Summary  Patient name: Ryan Montgomery Medical record number: [id number] Date of birth: 12/18/1955 Age: 64 y.o. Gender: male Date of Admission: 11/27/2019  Date of Discharge: 11/28/2019 Admitting Physician: Reece Leader, DO  Primary Care Provider: Nelwyn Salisbury, MD Consultants: Neuro  Indication for Hospitalization: Syncopal episode  Discharge Diagnoses/Problem List:  Syncope vs. Seizure Gout Hepatitis C  Disposition: Home   Discharge Condition: Stable  Discharge Exam:  Physical Exam Constitutional:      General: He is not in acute distress.    Appearance: Normal appearance. He is not ill-appearing.  HENT:     Head: Normocephalic and atraumatic.  Cardiovascular:     Rate and Rhythm: Normal rate and regular rhythm.     Heart sounds: No murmur heard.   Pulmonary:     Effort: Pulmonary effort is normal. No respiratory distress.     Breath sounds: Normal breath sounds. No wheezing.  Abdominal:     General: Abdomen is flat.     Palpations: Abdomen is soft.  Neurological:     General: No focal deficit present.     Mental Status: He is alert and oriented to person, place, and time. Mental status is at baseline.     GCS: GCS eye subscore is 4. GCS verbal subscore is 5. GCS motor subscore is 6.     Cranial Nerves: No cranial nerve deficit.     Sensory: No sensory deficit.     Motor: No weakness.     Coordination: Coordination normal.     Gait: Gait normal.  Psychiatric:        Attention and Perception: Attention and perception normal.        Mood and Affect: Mood and affect normal. Mood is not anxious. Affect is not blunt.        Speech: Speech normal.        Behavior: Behavior normal. Behavior is not slowed or withdrawn. Behavior is cooperative.        Thought Content: Thought content normal.        Cognition and Memory: Cognition normal.        Judgment: Judgment normal.    Brief Hospital Course:  Syncope Mr. Monteforte  presented to the ED the evening of 7/4 after a supposedly syncopal episode that afternoon.  He was at a cookout with his family and sat down in a chair in the shade when his family reports that he lost consciousness, slumped over, had urinary incontinence, was drooling, and had jerking movements of his arms and head.  The family says this episode lasted between 5 and 10 minutes.  Immediately after, Mr. Cardiff regained consciousness with no postictal state.  Since then, he has felt "like his normal self".  He denies experiencing any prodrome.  He reported decreased fluid intake during the day leading up to this episode. Labs include: WBC 11.6 > 11.3, Hemoglobin 12.9 > 12.6,  A1c 4.9, TSH within normal limits, Covid negative, HIV negative. UDS positive only for cannabis.  Remote history of alcohol abuse, however has not drank in over 7 months. Wells score 0. The admission and repeat EKG on 7/4 showed sinus rhythm, normal axis, no ST elevation, and appropriate R wave progression.  CT head on 7/4 demonstrated encephalomalacia in the superior right frontal lobe consistent with remote infarct, and remote infarct within the left caudate nucleus.  No CT evidence for acute infarction or hemorrhage.   EEG on 7/5 was normal, with no focal  lateralizing or up epileptiform features.  Echo on the same day was overall normal, LV ejection fraction 60 to 65%, moderate left ventricular hypertrophy.  The MRI brain with and without contrast on 7/5 showed a combination of findings. No acute intracranial abnormality. "Some most resemble sequelae of remote trauma (anterior bifrontal encephalomalacia), and also chronic small-vessel ischemia (chronic left caudate lacune). However, superimposed advanced bilateral white matter disease has a configuration suspicious for demyelination due to Multiple Sclerosis. The white matter changes could alternatively also reflect small vessel disease, and the brainstem and cerebellum appear spared."  While  neurology consult could not definitively conclude this episode was due to a seizure, they recommended starting Keppra 500 mg twice daily with a follow-up to outpatient neurology in 1 to 2 weeks.  Neurology instructed no driving until seizure-free for 6 months per [place] DMV statutes.  Gout Patient is on unknown medication. He thinks it ends in a "-stat". Likely febuxistat.  No joint pain indicative of a gout flare experienced during admission.     Hepatitis C Resolved and stable. Both patient and his wife endorse history of hepatitis C. Patient has completed the  8 month course of therapy. Currently not on any medications for this.     Issues for Follow Up:  1. Started on Keppra 500 mg twice daily by neuro consult due to high suspicion of seizures.  2. Follow-up outpatient neurology in 1 to 2 weeks.   Significant Procedures:  Admission EKG  (7/4) showed sinus rhythm, read complicated by artifact.  Axis, normal R wave progression.  No ST elevation.  Narrow QRS complex. Repeat EKG (7/4) showed normal sinus rhythm, normal axis, no ST elevation, no QT prolongation, appropriate R wave progression. CT head (7/4) demonstrated encephalomalacia in the superior right frontal lobe consistent with remote infarct, and remote infarct within the left caudate nucleus.  No CT evidence for acute infarction or hemorrhage.   EEG (7/5) normal, with no focal lateralizing or epileptiform features.   ECHO (7/5) showed LV ejection fraction 60 to 65%.  Moderate left ventricular hypertrophy.  Otherwise normal. MRI brain w+wo (7/5) showed a combination of findings. No acute intracranial abnormality. "Some most resemble sequelae of remote trauma (anterior bifrontal encephalomalacia), and also chronic small-vessel ischemia (chronic left caudate lacune). However, superimposed advanced bilateral white matter disease has a configuration suspicious for demyelination due to Multiple Sclerosis. The white matter changes could  alternatively also reflect small vessel disease, and the brainstem and cerebellum appear spared."  Significant Labs and Imaging:  Recent Labs  Lab 11/27/19 2119 11/28/19 0406 11/29/19 0307  WBC 11.6* 11.3* 9.9  HGB 12.9* 12.6* 13.0  HCT 41.4 38.8* 40.2  PLT 184 184 183   Recent Labs  Lab 11/27/19 1812 11/27/19 1812 11/27/19 2119 11/28/19 0406 11/29/19 0307  NA 140  --   --  141 137  K 4.0   < >  --  3.9 3.7  CL 107  --   --  111 104  CO2 24  --   --  23 23  GLUCOSE 101*  --   --  106* 84  BUN 13  --   --  13 11  CREATININE 1.27*  --  1.05 1.09 1.09  CALCIUM 9.2  --   --  9.0 8.9  MG 1.7  --   --   --   --   PHOS  --   --  3.7  --   --   ALKPHOS 77  --   --   --   --  AST 18  --   --   --   --   ALT 12  --   --   --   --   ALBUMIN 3.5  --   --   --   --    < > = values in this interval not displayed.    CT Head Wo Contrast  Result Date: 11/27/2019 CLINICAL DATA:  Seizure. EXAM: CT HEAD WITHOUT CONTRAST TECHNIQUE: Contiguous axial images were obtained from the base of the skull through the vertex without intravenous contrast. COMPARISON:  None. FINDINGS: Brain: There is mild central and cortical atrophy. Periventricular white matter changes are consistent with small vessel disease. There is encephalomalacia in the superior RIGHT frontal lobe consistent with remote infarct. Remote infarct identified within the LEFT caudate nucleus. There is no intra or extra-axial fluid collection or mass lesion. The basilar cisterns and ventricles have a normal appearance. There is no CT evidence for acute infarction or hemorrhage. Vascular: There is atherosclerotic calcification of the internal carotid arteries. No hyperdense vessels. Skull: Normal. Negative for fracture or focal lesion. Sinuses/Orbits: Small RIGHT mastoid effusion. Air-fluid levels within both maxillary sinuses. Orbits are unremarkable. Other: None IMPRESSION: 1. No evidence for acute intracranial abnormality. 2. Atrophy and  small vessel disease. 3. Encephalomalacia in the superior RIGHT frontal lobe consistent with remote infarct. 4. Remote infarct of the LEFT caudate nucleus. 5. Small RIGHT mastoid effusion. 6. Bilateral possibly acute maxillary sinusitis. Electronically Signed   By: Norva Pavlov M.D.   On: 11/27/2019 18:34   MR BRAIN W WO CONTRAST  Result Date: 11/28/2019 CLINICAL DATA:  64 year old male with possible syncope versus seizure. EXAM: MRI HEAD WITHOUT AND WITH CONTRAST TECHNIQUE: Multiplanar, multiecho pulse sequences of the brain and surrounding structures were obtained without and with intravenous contrast. CONTRAST:  8mL GADAVIST GADOBUTROL 1 MMOL/ML IV SOLN COMPARISON:  Head CT 11/27/2019. FINDINGS: Brain: No convincing restricted diffusion to suggest acute infarction. No midline shift, mass effect, evidence of mass lesion, ventriculomegaly, extra-axial collection or acute intracranial hemorrhage. Cervicomedullary junction and pituitary are within normal limits. There are multiple small areas of chronic cortical encephalomalacia in the anterior frontal lobes (series 11, image 16) and also the anterior superior right frontal gyrus (series 11, image 23). No associated hemosiderin. There is superimposed advanced, confluent, and also nodular bilateral periventricular white matter T2 and FLAIR hyperintensity, some of which is oriented perpendicular to the ventricles and with areas of cystic encephalomalacia. There is some temporal lobe involvement greater on the left. Corpus callosum volume seems to be maintained. There is deep white matter capsule involvement on the right. And there is superimposed T2 heterogeneity in the left caudate which more resembles chronic small vessel disease. The brainstem and cerebellum appear spared, with no chronic cerebral blood products identified. Following contrast No abnormal enhancement identified. No dural thickening. Additional thin slice coronal imaging of the temporal lobes  is provided. The hippocampal formations appear symmetric and within normal limits on series 9, image 14. Other mesial temporal lobe structures appear normal. Vascular: Major intracranial vascular flow voids are preserved. The major dural venous sinuses are enhancing and appear to be patent. Skull and upper cervical spine: Negative visible cervical spine, bone marrow signal. Sinuses/Orbits: Negative orbits. Trace paranasal sinus fluid or mucosal thickening is stable from yesterday. Other: Mastoids are well pneumatized. Visible internal auditory structures appear normal. Scalp and face soft tissues appear negative. IMPRESSION: 1. No acute intracranial abnormality. 2. A combination of findings are identified. Some most resemble  sequelae of remote trauma (anterior bifrontal encephalomalacia), and also chronic small-vessel ischemia (chronic left caudate lacune). However, superimposed advanced bilateral white matter disease has a configuration suspicious for demyelination due to Multiple Sclerosis. The white matter changes could alternatively also reflect small vessel disease, and the brainstem and cerebellum appear spared. No abnormal enhancement or chronic cerebral blood products. 3. Dedicated seizure imaging without evidence of mesial temporal sclerosis. Electronically Signed   By: Odessa Fleming M.D.   On: 11/28/2019 15:07   Portable chest 1 View  Result Date: 11/27/2019 CLINICAL DATA:  Syncope EXAM: PORTABLE CHEST 1 VIEW COMPARISON:  None. FINDINGS: The heart size and mediastinal contours are within normal limits. Both lungs are clear. The visualized skeletal structures are unremarkable. IMPRESSION: No active disease. Electronically Signed   By: Romona Curls M.D.   On: 11/27/2019 21:48   EEG adult  Result Date: 11/28/2019 Thana Farr, MD     11/28/2019  1:32 PM ELECTROENCEPHALOGRAM REPORT Patient: Deklan Minar       Room #: 1O10R EEG No. ID: [id number] Age: 64 y.o.        Sex: male Requesting Physician: Hensel  Report Date:  11/28/2019       Interpreting Physician: Thana Farr History: Devery Murgia is an 64 y.o. male with an episode of unresponsiveness Medications: ASA, Thiamine Conditions of Recording:  This is a 21 channel routine scalp EEG performed with bipolar and monopolar montages arranged in accordance to the international 10/20 system of electrode placement. One channel was dedicated to EKG recording. The patient is in the awake and drowsy states. Description:  The waking background activity consists of a low voltage, symmetrical, fairly well organized, 9 Hz alpha activity, seen from the parieto-occipital and posterior temporal regions.  Low voltage fast activity, poorly organized, is seen anteriorly and is at times superimposed on more posterior regions.  A mixture of theta and alpha rhythms are seen from the central and temporal regions. The patient drowses with slowing to irregular, low voltage theta and beta activity.  Stage II sleep is not obtained No epileptiform activity is noted.   Hyperventilation was not performed.  Intermittent photic stimulation was performed but failed to illicit any change in the tracing.  IMPRESSION: Normal electroencephalogram, awake, drowsy and with activation procedures. There are no focal lateralizing or epileptiform features. Thana Farr, MD Neurology [phone] 11/28/2019, 1:28 PM   ECHOCARDIOGRAM COMPLETE  Result Date: 11/28/2019    ECHOCARDIOGRAM REPORT   Patient Name:   HARSH TRULOCK Date of Exam: 11/28/2019 Medical Rec #:  [id number]       Height:       71.0 in Accession #:    [id number]      Weight:       187.9 lb Date of Birth:  1955-06-26        BSA:          2.053 m Patient Age:    64 years        BP:           139/88 mmHg Patient Gender: M               HR:           81 bpm. Exam Location:  Inpatient Procedure: 2D Echo, Cardiac Doppler and Color Doppler Indications:    Syncope  History:        Patient has no prior history of Echocardiogram examinations.  Risk Factors:Current Smoker.  Sonographer:    Ross Ludwig RDCS (AE) Referring Phys: Lyndel Pleasure Chrissie Noa A HENSEL IMPRESSIONS  1. Left ventricular ejection fraction, by estimation, is 60 to 65%. The left ventricle has normal function. The left ventricle has no regional wall motion abnormalities. There is moderate left ventricular hypertrophy. Left ventricular diastolic parameters were normal.  2. Right ventricular systolic function is normal. The right ventricular size is normal. Tricuspid regurgitation signal is inadequate for assessing PA pressure.  3. The mitral valve is grossly normal. Trivial mitral valve regurgitation.  4. The aortic valve is tricuspid. Aortic valve regurgitation is trivial.  5. Aortic dilatation noted. 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  6. The inferior vena cava is normal in size with greater than 50% respiratory variability, suggesting right atrial pressure of 3 mmHg. FINDINGS  Left Ventricle: Left ventricular ejection fraction, by estimation, is 60 to 65%. The left ventricle has normal function. The left ventricle has no regional wall motion abnormalities. The left ventricular internal cavity size was normal in size. There is  moderate left ventricular hypertrophy. Left ventricular diastolic parameters were normal. Right Ventricle: The right ventricular size is normal. No increase in right ventricular wall thickness. Right ventricular systolic function is normal. Tricuspid regurgitation signal is inadequate for assessing PA pressure. Left Atrium: Left atrial size was normal in size. Right Atrium: Right atrial size was normal in size. Pericardium: There is no evidence of pericardial effusion. Mitral Valve: The mitral valve is grossly normal. Trivial mitral valve regurgitation. MV peak gradient, 2.5 mmHg. The mean mitral valve gradient is 1.0 mmHg. Tricuspid Valve: The tricuspid valve is grossly normal. Tricuspid valve regurgitation is trivial. Aortic Valve: The  aortic valve is tricuspid. Aortic valve regurgitation is trivial. Aortic valve mean gradient measures 2.0 mmHg. Aortic valve peak gradient measures 3.8 mmHg. Aortic valve area, by VTI measures 2.55 cm. Pulmonic Valve: The pulmonic valve was grossly normal. Pulmonic valve regurgitation is trivial. Aorta: Aortic dilatation noted. There is moderate dilatation of the aortic root. Venous: The inferior vena cava is normal in size with greater than 50% respiratory variability, suggesting right atrial pressure of 3 mmHg. IAS/Shunts: No atrial level shunt detected by color flow Doppler.  LEFT VENTRICLE PLAX 2D LVIDd:         3.60 cm  Diastology LVIDs:         2.10 cm  LV e' lateral:   12.00 cm/s LV PW:         1.50 cm  LV E/e' lateral: 6.0 LV IVS:        1.60 cm  LV e' medial:    7.18 cm/s LVOT diam:     2.20 cm  LV E/e' medial:  10.0 LV SV:         58 LV SV Index:   28 LVOT Area:     3.80 cm  RIGHT VENTRICLE             IVC RV Basal diam:  2.50 cm     IVC diam: 1.30 cm RV S prime:     10.80 cm/s TAPSE (M-mode): 2.3 cm LEFT ATRIUM             Index       RIGHT ATRIUM           Index LA diam:        2.70 cm 1.31 cm/m  RA Area:     12.40 cm LA Vol (A2C):   65.5 ml  31.90 ml/m RA Volume:   23.50 ml  11.44 ml/m LA Vol (A4C):   37.0 ml 18.02 ml/m LA Biplane Vol: 51.4 ml 25.03 ml/m  AORTIC VALVE AV Area (Vmax):    2.65 cm AV Area (Vmean):   2.48 cm AV Area (VTI):     2.55 cm AV Vmax:           98.00 cm/s AV Vmean:          71.600 cm/s AV VTI:            0.228 m AV Peak Grad:      3.8 mmHg AV Mean Grad:      2.0 mmHg LVOT Vmax:         68.30 cm/s LVOT Vmean:        46.800 cm/s LVOT VTI:          0.153 m LVOT/AV VTI ratio: 0.67  AORTA Ao Root diam: 4.80 cm Ao Asc diam:  3.60 cm MITRAL VALVE MV Area (PHT): 2.95 cm    SHUNTS MV Peak grad:  2.5 mmHg    Systemic VTI:  0.15 m MV Mean grad:  1.0 mmHg    Systemic Diam: 2.20 cm MV Vmax:       0.79 m/s MV Vmean:      47.8 cm/s MV Decel Time: 257 msec MV E velocity: 71.60 cm/s MV A  velocity: 66.80 cm/s MV E/A ratio:  1.07 Nona DellSamuel Mcdowell MD Electronically signed by Nona DellSamuel Mcdowell MD Signature Date/Time: 11/28/2019/3:51:01 PM    Final      Results/Tests Pending at Time of Discharge: CBC and BMP  Discharge Medications:  Allergies as of 11/29/2019      Reactions   Moxifloxacin Hives      Medication List    STOP taking these medications   indomethacin 50 MG capsule Commonly known as: INDOCIN     TAKE these medications   acetaminophen 500 MG tablet Commonly known as: TYLENOL Take 500 mg by mouth every 6 (six) hours as needed for headache (pain).   aspirin EC 81 MG tablet Take 1 tablet (81 mg total) by mouth once for 1 dose. Swallow whole. What changed: when to take this   ibuprofen 600 MG tablet Commonly known as: ADVIL Take 1 tablet (600 mg total) by mouth every 6 (six) hours as needed for moderate pain.   levETIRAcetam 500 MG tablet Commonly known as: KEPPRA Take 1 tablet (500 mg total) by mouth 2 (two) times daily.   oxyCODONE 5 MG immediate release tablet Commonly known as: Oxy IR/ROXICODONE Take 5 mg by mouth daily as needed for severe pain.       Discharge Instructions: Please refer to Patient Instructions section of EMR for full details.  Patient was counseled important signs and symptoms that should prompt return to medical care, changes in medications, dietary instructions, activity restrictions, and follow up appointments.   Follow-Up Appointments:   Follow-up Information    Nelwyn SalisburyFry, Stephen A, MD. Schedule an appointment as soon as possible for a visit.   Specialty: Family Medicine Contact information: [address] [place] [address] [phone]        [facility]. Schedule an appointment as soon as possible for a visit.   Contact information: [address], [address] [place] [id number] [phone]       [facility] Follow up.   Specialty: Home Health Services Why: HH[facility] resumption  of services Contact information: [address] [place] [address] [phone]                Follow-up Information    Nelwyn Salisbury, MD. Schedule an appointment as soon as possible for a visit.   Specialty: Family Medicine Contact information: [address] [place] [address] [phone]        [facility]. Schedule an appointment as soon as possible for a visit.   Contact information: [address] [place], [address] [place] [address] [phone]       [facility] Follow up.   Specialty: Home Health Services Why: HH[facility] resumption of services Contact information: [address] [place] [address] [phone]              Fayette Pho, MD 11/29/2019, 3:12 PM PGY-1, [facility]  Lavonda Jumbo, DO 11/29/2019, 4:30 PM PGY-2, [facility] Family Medicine

## 2019-11-28 NOTE — Progress Notes (Signed)
OT Cancellation Note  Patient Details Name: Ryan Montgomery MRN: [id number] DOB: 1955-08-29   Cancelled Treatment:    Reason Eval/Treat Not Completed: Patient at procedure or test/ unavailable (Pt having EEG procedure in room. OT to continue to follow)  OT to attempt at a later treatment time for OT eval.  Flora Lipps, OTR/L Acute Rehabilitation Services Pager: [phone] Office: [phone]    Ricarda Atayde C 11/28/2019, 9:08 AM
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
## 2019-11-28 NOTE — Evaluation (Signed)
Occupational Therapy Evaluation Patient Details Name: Ryan Montgomery MRN: [id number] DOB: June 08, 1955 Today's Date: 11/28/2019    History of Present Illness Ryan Montgomery is a 64 y.o. male presenting with syncopal episode with jerking motionsm, urinary incontinence and LOC. THC in UDS. PMH is significant for treated hepatitis C and gout, h/o stroke, h/o alcohol abuse, L hip sx in 08/2019.   Clinical Impression   Pt PTA: living with spouse, recent hip fx 05/2019 and surgery 08/2019. Pt maintaining TDWB status with mobility and ADl when standing at sink and for toilet hygiene with use of RW. Pt performing walk in shower transfer with RW and use of bars to simulate home environment. Pt reports minimal pain in LE. Pt able to perform ADL routine at EOB and standing at sink with no physical assist- appeared to be modified independent. VSS. O2 on RA 98% and 70-90 BPM with exertion. Pt does not require continued OT skilled services. OT signing off.    Follow Up Recommendations  No OT follow up    Equipment Recommendations  None recommended by OT    Recommendations for Other Services       Precautions / Restrictions Precautions Precautions: Fall Restrictions Weight Bearing Restrictions: Yes Other Position/Activity Restrictions: TTWB 10# allowed per pt      Mobility Bed Mobility Overal bed mobility: Modified Independent                Transfers Overall transfer level: Needs assistance Equipment used: Rolling walker (2 wheeled) Transfers: Sit to/from Stand Sit to Stand: Supervision;Modified independent (Device/Increase time)         General transfer comment: supervisionA from bed as pt's first time getting up; then mod(I) the rest of session.    Balance Overall balance assessment: Modified Independent;No apparent balance deficits (not formally assessed)                                         ADL either performed or assessed with clinical judgement   ADL  Overall ADL's : At baseline;Modified independent                                       General ADL Comments: Pt maintaining TDWB status with mobility and ADl when standing at sink and for toilet hygiene. tp performnig walk in shower transfer with RW and use of bars to simulate home environment. pt reports minimal pain in LE. Pt able to perform ADL routine at EOB and standing at sink.     Vision Baseline Vision/History: No visual deficits Patient Visual Report: No change from baseline Vision Assessment?: No apparent visual deficits     Perception     Praxis      Pertinent Vitals/Pain       Hand Dominance Right   Extremity/Trunk Assessment Upper Extremity Assessment Upper Extremity Assessment: Overall WFL for tasks assessed   Lower Extremity Assessment Lower Extremity Assessment: Overall WFL for tasks assessed;Generalized weakness;LLE deficits/detail LLE Deficits / Details: s/p hip sx TDWB at 10#   Cervical / Trunk Assessment Cervical / Trunk Assessment: Normal   Communication Communication Communication: No difficulties   Cognition Arousal/Alertness: Awake/alert Behavior During Therapy: WFL for tasks assessed/performed Overall Cognitive Status: Within Functional Limits for tasks assessed  General Comments  VSS. O2 on RA 98% and 70-90 BPM with exertion.    Exercises     Shoulder Instructions      Home Living Family/patient expects to be discharged to:: Private residence Living Arrangements: Spouse/significant other Available Help at Discharge: Family;Available PRN/intermittently Type of Home: House Home Access: Stairs to enter [employer] of Steps: 2 Entrance Stairs-Rails: Right Home Layout: One level     Bathroom Shower/Tub: Chief Strategy Officer: Handicapped height     Home Equipment: Environmental consultant - 2 wheels;Cane - single point;Grab bars - tub/shower          Prior  Functioning/Environment Level of Independence: Independent with assistive device(s)                 OT Problem List:        OT Treatment/Interventions:      OT Goals(Current goals can be found in the care plan section) Acute Rehab OT Goals Patient Stated Goal: to go home OT Goal Formulation: With patient  OT Frequency:     Barriers to D/C:            Co-evaluation              AM-PAC OT "6 Clicks" Daily Activity     Outcome Measure Help from another person eating meals?: None Help from another person taking care of personal grooming?: None Help from another person toileting, which includes using toliet, bedpan, or urinal?: None Help from another person bathing (including washing, rinsing, drying)?: None Help from another person to put on and taking off regular upper body clothing?: None Help from another person to put on and taking off regular lower body clothing?: None 6 Click Score: 24   End of Session Equipment Utilized During Treatment: Gait belt;Rolling walker Nurse Communication: Mobility status  Activity Tolerance: Patient tolerated treatment well Patient left: in bed;with call bell/phone within reach;Other (comment) (echo needing to see pt next)  OT Visit Diagnosis: Unsteadiness on feet (R26.81)                Time: 3846-6599 OT Time Calculation (min): 33 min Charges:  OT General Charges $OT Visit: 1 Visit OT Evaluation $OT Eval Moderate Complexity: 1 Mod OT Treatments $Self Care/Home Management : 8-22 mins  Flora Lipps, OTR/L Acute Rehabilitation Services Pager: [phone] Office: [phone]    Ryan Montgomery C 11/28/2019, 3:40 PM

## 2019-11-28 NOTE — Procedures (Addendum)
ELECTROENCEPHALOGRAM REPORT   Patient: Ryan Montgomery       Room #: 8K99I EEG No. ID: [id number] Age: 64 y.o.        Sex: male Requesting Physician: Hensel Report Date:  11/28/2019        Interpreting Physician: Thana Farr  History: [name] is an 64 y.o. male with an episode of unresponsiveness  Medications:  ASA, Thiamine  Conditions of Recording:  This is a 21 channel routine scalp EEG performed with bipolar and monopolar montages arranged in accordance to the international 10/20 system of electrode placement. One channel was dedicated to EKG recording.  The patient is in the awake and drowsy states.  Description:  The waking background activity consists of a low voltage, symmetrical, fairly well organized, 9 Hz alpha activity, seen from the parieto-occipital and posterior temporal regions.  Low voltage fast activity, poorly organized, is seen anteriorly and is at times superimposed on more posterior regions.  A mixture of theta and alpha rhythms are seen from the central and temporal regions. The patient drowses with slowing to irregular, low voltage theta and beta activity.   Stage II sleep is not obtained No epileptiform activity is noted.    Hyperventilation was not performed.  Intermittent photic stimulation was performed but failed to illicit any change in the tracing.     IMPRESSION: Normal electroencephalogram, awake, drowsy and with activation procedures. There are no focal lateralizing or epileptiform features.   Thana Farr, MD Neurology [phone]  11/28/2019, 1:28 PM

## 2019-11-28 NOTE — Progress Notes (Signed)
  Echocardiogram 2D Echocardiogram has been performed.  Ryan Montgomery 11/28/2019, 10:35 AM

## 2019-11-28 NOTE — Evaluation (Signed)
Physical Therapy Evaluation Patient Details Name: Ryan Montgomery MRN: [id number] DOB: 1955-12-03 Today's Date: 11/28/2019   History of Present Illness  Ryan Montgomery is a 64 y.o. male presenting with syncopal episode with jerking motionsm, urinary incontinence and LOC. THC in UDS. PMH is significant for treated hepatitis C and gout, h/o stroke, h/o alcohol abuse, L hip sx in 08/2019.  Clinical Impression  PTA pt is living with wife in single story home with 2 steps to enter. Pt reports ambulation with RW to maintain L LE TTWB, although also admits to using cane and increased weightbearing. Pt able to perform lower body dressing and cleaning with adaptive equipment, assists with iADLs. Pt is limited in safe mobility by weightbearing restrictions and use of DME. Pt is mod I in bed mobility, transfers and supervision for ambulation with RW and TTWB L LE. Pt is already current with [facility]PT rehabbing his L hip and he should continue therapy with them. PT will continue to follow acutely.     Follow Up Recommendations Home health PT    Equipment Recommendations  None recommended by PT       Precautions / Restrictions Precautions Precautions: Fall Restrictions Weight Bearing Restrictions: Yes Other Position/Activity Restrictions: TTWB 10# allowed per pt      Mobility  Bed Mobility Overal bed mobility: Modified Independent                Transfers Overall transfer level: Needs assistance Equipment used: Rolling walker (2 wheeled) Transfers: Sit to/from Stand Sit to Stand: Modified independent (Device/Increase time)         General transfer comment: mod I for use of RW to stand  Ambulation/Gait Ambulation/Gait assistance: Supervision Gait Distance (Feet): 50 Feet Assistive device: Rolling walker (2 wheeled) Gait Pattern/deviations: Step-to pattern;Decreased weight shift to left Gait velocity: slowed   General Gait Details: supervision for safety and cuing for maintaining TTWB  on L side        Balance Overall balance assessment: Modified Independent;No apparent balance deficits (not formally assessed)                                           Pertinent Vitals/Pain Pain Assessment: No/denies pain    Home Living Family/patient expects to be discharged to:: Private residence Living Arrangements: Spouse/significant other Available Help at Discharge: Family;Available PRN/intermittently Type of Home: House Home Access: Stairs to enter Entrance Stairs-Rails: Right Entrance Stairs-Number of Steps: 2 Home Layout: One level Home Equipment: Walker - 2 wheels;Cane - single point;Grab bars - tub/shower      Prior Function Level of Independence: Independent with assistive device(s)               Hand Dominance   Dominant Hand: Right    Extremity/Trunk Assessment   Upper Extremity Assessment Upper Extremity Assessment: Overall WFL for tasks assessed    Lower Extremity Assessment Lower Extremity Assessment: Overall WFL for tasks assessed LLE Deficits / Details: s/p hip sx TDWB at 10#    Cervical / Trunk Assessment Cervical / Trunk Assessment: Normal  Communication   Communication: No difficulties  Cognition Arousal/Alertness: Awake/alert Behavior During Therapy: WFL for tasks assessed/performed Overall Cognitive Status: Within Functional Limits for tasks assessed  General Comments General comments (skin integrity, edema, etc.): VSS on RA    Exercises General Exercises - Lower Extremity Quad Sets: AROM;Both;5 reps Straight Leg Raises: AROM;Left;5 reps;Seated   Assessment/Plan    PT Assessment Patient needs continued PT services (return to already set up PT for L hip)  PT Problem List Decreased mobility;Decreased balance;Decreased strength       PT Treatment Interventions DME instruction;Gait training;Stair training;Functional mobility training;Therapeutic  activities;Therapeutic exercise;Balance training;Cognitive remediation;Patient/family education    PT Goals (Current goals can be found in the Care Plan section)  Acute Rehab PT Goals Patient Stated Goal: to go home PT Goal Formulation: With patient Time For Goal Achievement: 12/12/19 Potential to Achieve Goals: Good    Frequency Min 3X/week    AM-PAC PT "6 Clicks" Mobility  Outcome Measure Help needed turning from your back to your side while in a flat bed without using bedrails?: None Help needed moving from lying on your back to sitting on the side of a flat bed without using bedrails?: None Help needed moving to and from a bed to a chair (including a wheelchair)?: None Help needed standing up from a chair using your arms (e.g., wheelchair or bedside chair)?: None Help needed to walk in hospital room?: None Help needed climbing 3-5 steps with a railing? : A Little 6 Click Score: 23    End of Session   Activity Tolerance: Patient tolerated treatment well Patient left: in chair;with call bell/phone within reach;with nursing/sitter in room Nurse Communication: Mobility status PT Visit Diagnosis: Other abnormalities of gait and mobility (R26.89);Difficulty in walking, not elsewhere classified (R26.2)    Time: 9604-5409 PT Time Calculation (min) (ACUTE ONLY): 37 min   Charges:   PT Evaluation $PT Eval Moderate Complexity: 1 Mod PT Treatments $Therapeutic Exercise: 8-22 mins        Hughey Rittenberry B. Beverely Risen PT, DPT Acute Rehabilitation Services Pager [phone] Office [phone]   Elon Alas Fleet 11/28/2019, 5:22 PM

## 2019-11-29 ENCOUNTER — Other Ambulatory Visit: Payer: Self-pay | Admitting: Family Medicine

## 2019-11-29 DIAGNOSIS — Z8673 Personal history of transient ischemic attack (TIA), and cerebral infarction without residual deficits: Secondary | ICD-10-CM

## 2019-11-29 DIAGNOSIS — G40909 Epilepsy, unspecified, not intractable, without status epilepticus: Secondary | ICD-10-CM

## 2019-11-29 LAB — CBC
HCT: 40.2 % (ref 39.0–52.0)
Hemoglobin: 13 g/dL (ref 13.0–17.0)
MCH: 31.6 pg (ref 26.0–34.0)
MCHC: 32.3 g/dL (ref 30.0–36.0)
MCV: 97.8 fL (ref 80.0–100.0)
Platelets: 183 10*3/uL (ref 150–400)
RBC: 4.11 MIL/uL — ABNORMAL LOW (ref 4.22–5.81)
RDW: 12.7 % (ref 11.5–15.5)
WBC: 9.9 10*3/uL (ref 4.0–10.5)
nRBC: 0 % (ref 0.0–0.2)

## 2019-11-29 LAB — BASIC METABOLIC PANEL
Anion gap: 10 (ref 5–15)
BUN: 11 mg/dL (ref 8–23)
CO2: 23 mmol/L (ref 22–32)
Calcium: 8.9 mg/dL (ref 8.9–10.3)
Chloride: 104 mmol/L (ref 98–111)
Creatinine, Ser: 1.09 mg/dL (ref 0.61–1.24)
GFR calc Af Amer: >60 mL/min (ref >60)
GFR calc non Af Amer: >60 mL/min (ref >60)
Glucose, Bld: 84 mg/dL (ref 70–99)
Potassium: 3.7 mmol/L (ref 3.5–5.1)
Sodium: 137 mmol/L (ref 135–145)

## 2019-11-29 MED ORDER — LEVETIRACETAM 500 MG PO TABS: 500.0000 mg | ORAL_TABLET | Freq: Two times a day (BID) | ORAL | 0 refills | Status: DC

## 2019-11-29 MED ORDER — ASPIRIN EC 81 MG PO TBEC: 81.0000 mg | DELAYED_RELEASE_TABLET | Freq: Once | ORAL | 11 refills | Status: AC

## 2019-11-29 MED ORDER — IBUPROFEN 600 MG PO TABS: 600.0000 mg | ORAL_TABLET | Freq: Four times a day (QID) | ORAL | 0 refills | Status: DC | PRN

## 2019-11-29 NOTE — Discharge Instructions (Signed)
Dear Ryan Montgomery,   Thank you so much for allowing [place] to be part of your care!  You were admitted to [facility] for a passing out episode, which we believe may have been due to a seizure.  Per [facility] statutes, you are not allowed to drive until you have been seizure-free for six months. Use caution when using heavy equipment or power tools. Avoid working on ladders or at heights. Take showers instead of baths. Ensure the water temperature is not too high on the home water heater. Do not go swimming alone. When caring for infants or small children, sit down when holding, feeding, or changing them to minimize risk of injury to the child in the event you have a seizure.    POST-HOSPITAL & CARE INSTRUCTIONS 1. Make sure you follow-up with neurology and your primary care provider.  2. We have sent you in a new seizure medication, Keppra, which you will take twice daily. 3. Please let PCP/Specialists know of any changes that were made.  4. Please see medications section of this packet for any medication changes.  5. Discuss starting a "statin" medication with PCP given your history of stroke in the past.   RETURN PRECAUTIONS: If you have any recurrent syncopal episodes (passing out), chest pain, shortness of breath.  Take care and be well!  Family Medicine Teaching Service Inpatient Team [place]  [facility]  [address] [place], [place] [address] [phone]

## 2019-11-29 NOTE — Progress Notes (Addendum)
Family Medicine Teaching Service Daily Progress Note Intern Pager: [phone]  Patient name: Ryan Montgomery Medical record number: [id number] Date of birth: 1956-03-26 Age: 64 y.o. Gender: male  Primary Care Provider: Nelwyn Salisbury, MD Consultants: Neuro Code Status: Full  Pt Overview and Major Events to Date:  Patient is currently hospital day #2, presented with suspected syncopal episode.  No history of seizures. Neuro consult hedging that CT and MRI results indicate potential seizure nidus.  Started on Keppra 500 mg twice daily.  CXR negative, EEG negative for seizure activity, CT head negative for acute infarction or hemorrhage.  Echo largely normal, LV EF 60 to 65%, moderate left ventricular hypertrophy.  Assessment and Plan: Ryan Montgomery a 64 y.o.malepresenting with suspected syncopal episode.  PMHx significant for CVA in 2018, gout, and treated hepatitis C.    Syncope vs Seizure WBC 11.6 > 11.3 >9.9.  Hemoglobin 12.9 > 12.6 >13.0.  A1c 4.9.  EKG 7/4 at 1716 shows normal sinus rhythm, normal axis, no QT prolongation, appropriate R wave progression.  Repeat EKG 7/4 at 2204 shows normal sinus rhythm, no changes from previous. CT head demonstrates encephalomalacia in the superior right frontal lobe consistent with remote infarct, and remote infarct within the left caudate nucleus.  No CT evidence for acute infarction or hemorrhage.  EEG normal, with no focal lateralizing or epileptiform features. Echo largely normal, LV EF 60 to 65%, moderate left ventricular hypertrophy.   MRI brain with and without contrast shows combination of findings: Remote trauma (anterior bifrontal encephalomalacia and (and chronic small vessel ischemia (chronic left caudate lacunae), no abnormal enhancement or chronic cerebral blood products.  TSH within normal limits, Covid negative, HIV negative.  UDS positive only for cannabis.  Remote history of alcohol abuse, however has not drank in over 7 months.  Wells  score 0.  No recommends 500 mg Keppra twice daily, with outpatient neuro follow-up in 1 to 2 weeks.  Keppra started 7/5 at 2155.  -Continue cardiac monitoring -Monitor CIWA's -Continue aspirin  Gout Patient is on unknown medication. He thinks it ends in a stat. Likely febuxistat.  - continue to monitor  Hepatitis C Resolved and stable. Both patient and his wife endorse history of hepatitis C. Patient has completed the 8 month course of therapy. Currently not on any medications for this.  -Consider follow-up as outpatient  FEN/GI:Full diet Prophylaxis:Lovenox  Subjective:  Ryan Montgomery was found napping in bed upon entry to the room. He was easily awoken, conversational and pleasant. He has no complaints. Started keppra last night. No HA, dizziness, n/v. Feels just fine. Is looking forward to discharge today and wants to get his IV out soon.   Objective: Temp:  [97.8 F (36.6 C)-98.7 F (37.1 C)] 98 F (36.7 C) (07/06 0439) Pulse Rate:  [71-81] 74 (07/06 0439) Resp:  [18-20] 18 (07/06 0439) BP: (121-153)/(69-89) 121/69 (07/06 0439) SpO2:  [95 %-98 %] 95 % (07/06 0439) Weight:  [85 kg] 85 kg (07/06 0439)  Physical Exam Constitutional:      Appearance: Normal appearance. He is normal weight.  HENT:     Head: Normocephalic.  Cardiovascular:     Rate and Rhythm: Normal rate and regular rhythm.     Pulses: Normal pulses.     Heart sounds: Normal heart sounds. No murmur heard.   Pulmonary:     Effort: Pulmonary effort is normal. No respiratory distress.     Breath sounds: Normal breath sounds. No wheezing.  Musculoskeletal:  General: No swelling, tenderness or deformity.     Right lower leg: No edema.     Left lower leg: No edema.  Skin:    General: Skin is warm and dry.  Neurological:     Mental Status: He is alert and oriented to person, place, and time.  Psychiatric:        Mood and Affect: Mood normal.        Thought Content: Thought content normal.       Laboratory: Recent Labs  Lab 11/27/19 2119 11/28/19 0406 11/29/19 0307  WBC 11.6* 11.3* 9.9  HGB 12.9* 12.6* 13.0  HCT 41.4 38.8* 40.2  PLT 184 184 183   Recent Labs  Lab 11/27/19 1812 11/27/19 1812 11/27/19 2119 11/28/19 0406 11/29/19 0307  NA 140  --   --  141 137  K 4.0  --   --  3.9 3.7  CL 107  --   --  111 104  CO2 24  --   --  23 23  BUN 13  --   --  13 11  CREATININE 1.27*   < > 1.05 1.09 1.09  CALCIUM 9.2  --   --  9.0 8.9  PROT 8.4*  --   --   --   --   BILITOT 0.5  --   --   --   --   ALKPHOS 77  --   --   --   --   ALT 12  --   --   --   --   AST 18  --   --   --   --   GLUCOSE 101*  --   --  106* 84   < > = values in this interval not displayed.   Imaging/Diagnostic Tests: MR BRAIN W WO CONTRAST  Result Date: 11/28/2019 CLINICAL DATA:  64 year old male with possible syncope versus seizure. EXAM: MRI HEAD WITHOUT AND WITH CONTRAST TECHNIQUE: Multiplanar, multiecho pulse sequences of the brain and surrounding structures were obtained without and with intravenous contrast. CONTRAST:  8mL GADAVIST GADOBUTROL 1 MMOL/ML IV SOLN COMPARISON:  Head CT 11/27/2019. FINDINGS: Brain: No convincing restricted diffusion to suggest acute infarction. No midline shift, mass effect, evidence of mass lesion, ventriculomegaly, extra-axial collection or acute intracranial hemorrhage. Cervicomedullary junction and pituitary are within normal limits. There are multiple small areas of chronic cortical encephalomalacia in the anterior frontal lobes (series 11, image 16) and also the anterior superior right frontal gyrus (series 11, image 23). No associated hemosiderin. There is superimposed advanced, confluent, and also nodular bilateral periventricular white matter T2 and FLAIR hyperintensity, some of which is oriented perpendicular to the ventricles and with areas of cystic encephalomalacia. There is some temporal lobe involvement greater on the left. Corpus callosum volume seems to  be maintained. There is deep white matter capsule involvement on the right. And there is superimposed T2 heterogeneity in the left caudate which more resembles chronic small vessel disease. The brainstem and cerebellum appear spared, with no chronic cerebral blood products identified. Following contrast No abnormal enhancement identified. No dural thickening. Additional thin slice coronal imaging of the temporal lobes is provided. The hippocampal formations appear symmetric and within normal limits on series 9, image 14. Other mesial temporal lobe structures appear normal. Vascular: Major intracranial vascular flow voids are preserved. The major dural venous sinuses are enhancing and appear to be patent. Skull and upper cervical spine: Negative visible cervical spine, bone marrow signal. Sinuses/Orbits: Negative orbits. Trace  paranasal sinus fluid or mucosal thickening is stable from yesterday. Other: Mastoids are well pneumatized. Visible internal auditory structures appear normal. Scalp and face soft tissues appear negative. IMPRESSION: 1. No acute intracranial abnormality. 2. A combination of findings are identified. Some most resemble sequelae of remote trauma (anterior bifrontal encephalomalacia), and also chronic small-vessel ischemia (chronic left caudate lacune). However, superimposed advanced bilateral white matter disease has a configuration suspicious for demyelination due to Multiple Sclerosis. The white matter changes could alternatively also reflect small vessel disease, and the brainstem and cerebellum appear spared. No abnormal enhancement or chronic cerebral blood products. 3. Dedicated seizure imaging without evidence of mesial temporal sclerosis. Electronically Signed   By: Odessa Fleming M.D.   On: 11/28/2019 15:07   EEG adult  Result Date: 11/28/2019 Thana Farr, MD     11/28/2019  1:32 PM ELECTROENCEPHALOGRAM REPORT Patient: Kalijah Zeiss       Room #: 8M38T EEG No. ID: [id number] Age: 64 y.o.         Sex: male Requesting Physician: Hensel Report Date:  11/28/2019       Interpreting Physician: Thana Farr History: Cortney Mckinney is an 64 y.o. male with an episode of unresponsiveness Medications: ASA, Thiamine Conditions of Recording:  This is a 21 channel routine scalp EEG performed with bipolar and monopolar montages arranged in accordance to the international 10/20 system of electrode placement. One channel was dedicated to EKG recording. The patient is in the awake and drowsy states. Description:  The waking background activity consists of a low voltage, symmetrical, fairly well organized, 9 Hz alpha activity, seen from the parieto-occipital and posterior temporal regions.  Low voltage fast activity, poorly organized, is seen anteriorly and is at times superimposed on more posterior regions.  A mixture of theta and alpha rhythms are seen from the central and temporal regions. The patient drowses with slowing to irregular, low voltage theta and beta activity.  Stage II sleep is not obtained No epileptiform activity is noted.   Hyperventilation was not performed.  Intermittent photic stimulation was performed but failed to illicit any change in the tracing.  IMPRESSION: Normal electroencephalogram, awake, drowsy and with activation procedures. There are no focal lateralizing or epileptiform features. Thana Farr, MD Neurology [phone] 11/28/2019, 1:28 PM   ECHOCARDIOGRAM COMPLETE  Result Date: 11/28/2019    ECHOCARDIOGRAM REPORT   Patient Name:   RUAL VERMEER Date of Exam: 11/28/2019 Medical Rec #:  [id number]       Height:       71.0 in Accession #:    [id number]      Weight:       187.9 lb Date of Birth:  1956-03-14        BSA:          2.053 m Patient Age:    64 years        BP:           139/88 mmHg Patient Gender: M               HR:           81 bpm. Exam Location:  Inpatient Procedure: 2D Echo, Cardiac Doppler and Color Doppler Indications:    Syncope  History:        Patient has no prior  history of Echocardiogram examinations.                 Risk Factors:Current Smoker.  Sonographer:    Ross Ludwig RDCS (AE) Referring Phys:  [id number] WILLIAM A HENSEL IMPRESSIONS  1. Left ventricular ejection fraction, by estimation, is 60 to 65%. The left ventricle has normal function. The left ventricle has no regional wall motion abnormalities. There is moderate left ventricular hypertrophy. Left ventricular diastolic parameters were normal.  2. Right ventricular systolic function is normal. The right ventricular size is normal. Tricuspid regurgitation signal is inadequate for assessing PA pressure.  3. The mitral valve is grossly normal. Trivial mitral valve regurgitation.  4. The aortic valve is tricuspid. Aortic valve regurgitation is trivial.  5. Aortic dilatation noted. 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  6. The inferior vena cava is normal in size with greater than 50% respiratory variability, suggesting right atrial pressure of 3 mmHg. FINDINGS  Left Ventricle: Left ventricular ejection fraction, by estimation, is 60 to 65%. The left ventricle has normal function. The left ventricle has no regional wall motion abnormalities. The left ventricular internal cavity size was normal in size. There is  moderate left ventricular hypertrophy. Left ventricular diastolic parameters were normal. Right Ventricle: The right ventricular size is normal. No increase in right ventricular wall thickness. Right ventricular systolic function is normal. Tricuspid regurgitation signal is inadequate for assessing PA pressure. Left Atrium: Left atrial size was normal in size. Right Atrium: Right atrial size was normal in size. Pericardium: There is no evidence of pericardial effusion. Mitral Valve: The mitral valve is grossly normal. Trivial mitral valve regurgitation. MV peak gradient, 2.5 mmHg. The mean mitral valve gradient is 1.0 mmHg. Tricuspid Valve: The tricuspid valve is grossly normal. Tricuspid valve  regurgitation is trivial. Aortic Valve: The aortic valve is tricuspid. Aortic valve regurgitation is trivial. Aortic valve mean gradient measures 2.0 mmHg. Aortic valve peak gradient measures 3.8 mmHg. Aortic valve area, by VTI measures 2.55 cm. Pulmonic Valve: The pulmonic valve was grossly normal. Pulmonic valve regurgitation is trivial. Aorta: Aortic dilatation noted. There is moderate dilatation of the aortic root. Venous: The inferior vena cava is normal in size with greater than 50% respiratory variability, suggesting right atrial pressure of 3 mmHg. IAS/Shunts: No atrial level shunt detected by color flow Doppler.  LEFT VENTRICLE PLAX 2D LVIDd:         3.60 cm  Diastology LVIDs:         2.10 cm  LV e' lateral:   12.00 cm/s LV PW:         1.50 cm  LV E/e' lateral: 6.0 LV IVS:        1.60 cm  LV e' medial:    7.18 cm/s LVOT diam:     2.20 cm  LV E/e' medial:  10.0 LV SV:         58 LV SV Index:   28 LVOT Area:     3.80 cm  RIGHT VENTRICLE             IVC RV Basal diam:  2.50 cm     IVC diam: 1.30 cm RV S prime:     10.80 cm/s TAPSE (M-mode): 2.3 cm LEFT ATRIUM             Index       RIGHT ATRIUM           Index LA diam:        2.70 cm 1.31 cm/m  RA Area:     12.40 cm LA Vol (A2C):   65.5 ml 31.90 ml/m RA Volume:   23.50 ml  11.44 ml/m LA Vol (A4C):  37.0 ml 18.02 ml/m LA Biplane Vol: 51.4 ml 25.03 ml/m  AORTIC VALVE AV Area (Vmax):    2.65 cm AV Area (Vmean):   2.48 cm AV Area (VTI):     2.55 cm AV Vmax:           98.00 cm/s AV Vmean:          71.600 cm/s AV VTI:            0.228 m AV Peak Grad:      3.8 mmHg AV Mean Grad:      2.0 mmHg LVOT Vmax:         68.30 cm/s LVOT Vmean:        46.800 cm/s LVOT VTI:          0.153 m LVOT/AV VTI ratio: 0.67  AORTA Ao Root diam: 4.80 cm Ao Asc diam:  3.60 cm MITRAL VALVE MV Area (PHT): 2.95 cm    SHUNTS MV Peak grad:  2.5 mmHg    Systemic VTI:  0.15 m MV Mean grad:  1.0 mmHg    Systemic Diam: 2.20 cm MV Vmax:       0.79 m/s MV Vmean:      47.8 cm/s MV Decel  Time: 257 msec MV E velocity: 71.60 cm/s MV A velocity: 66.80 cm/s MV E/A ratio:  1.07 Nona Dell MD Electronically signed by Nona Dell MD Signature Date/Time: 11/28/2019/3:51:01 PM    Final     Fayette Pho, MD 11/29/2019, 7:47 AM PGY-1, [facility] FPTS Intern pager: [phone], text pages welcome

## 2019-11-29 NOTE — Plan of Care (Signed)
Problem: Education: Goal: Knowledge of General Education information will improve Description: Including pain rating scale, medication(s)/side effects and non-pharmacologic comfort measures Outcome: Completed/Met   Problem: Health Behavior/Discharge Planning: Goal: Ability to manage health-related needs will improve Outcome: Completed/Met   Problem: Clinical Measurements: Goal: Ability to maintain clinical measurements within normal limits will improve Outcome: Completed/Met Goal: Will remain free from infection Outcome: Completed/Met Goal: Diagnostic test results will improve Outcome: Completed/Met Goal: Respiratory complications will improve Outcome: Completed/Met Goal: Cardiovascular complication will be avoided Outcome: Completed/Met   Problem: Activity: Goal: Risk for activity intolerance will decrease Outcome: Adequate for Discharge   Problem: Nutrition: Goal: Adequate nutrition will be maintained Outcome: Completed/Met   Problem: Coping: Goal: Level of anxiety will decrease Outcome: Completed/Met   Problem: Elimination: Goal: Will not experience complications related to bowel motility Outcome: Completed/Met Goal: Will not experience complications related to urinary retention Outcome: Completed/Met   Problem: Pain Managment: Goal: General experience of comfort will improve Outcome: Completed/Met   Problem: Safety: Goal: Ability to remain free from injury will improve Outcome: Completed/Met   Problem: Skin Integrity: Goal: Risk for impaired skin integrity will decrease Outcome: Completed/Met   

## 2019-11-29 NOTE — TOC Transition Note (Signed)
Transition of Care (TOC) - CM/SW Discharge Note Donn Pierini RN, BSN Transitions of Care Unit 4E- RN Case Manager See Treatment Team for direct phone # Cross Coverage for 6E   Patient Details  Name: Ryan Montgomery MRN: [id number] Date of Birth: 03/02/1956  Transition of Care [facility]) CM/SW Contact:  Darrold Span, RN Phone Number: 11/29/2019, 11:40 AM   Clinical Narrative:    Pt stable for transition home today, spoke with pt at bedside- confirmed pt goes to the [place] for f/u and medication needs- script for Keppra was sent to [facility] pharmacy- pt reports he does not have his wallet with him and no way to pay for med- he would prefer to go to outside pharmacy [facility] on [address] to get script filled so he can go home and get his wallet. He plans to call a [facility] to transport him home. Pt also requesting a note for his employer and [facility] regarding his admission.  He has already spoken to his therapist with [facility] for resumption of [facility] services.  Call made to Dr. Annia Friendly regarding medication and need to send script to [facility], request for note by patient and order for HHPT to resume services. Dr Annia Friendly will take care of these things and pt will be ready for discharge.  Call made to Tiffany with [facility] regarding resumption of HHPT services- they will f/u with pt.    Final next level of care: Home w Home Health Services Barriers to Discharge: No Barriers Identified   Patient Goals and CMS Choice Patient states their goals for this hospitalization and ongoing recovery are:: return home CMS Medicare.gov Compare Post Acute Care list provided to:: Patient Choice offered to / list presented to : Patient  Discharge Placement             Home with [facility].           Discharge Plan and Services   Discharge Planning Services: CM Consult Post Acute Care Choice: Home Health, Resumption of Svcs/PTA Provider          DME Arranged: N/A         HH Arranged: PT HH Agency: [facility] Date HH Agency Contacted: 11/29/19 Time HH Agency Contacted: 1137 Representative spoke with at [facility] Agency: Tiffany  Social Determinants of Health (SDOH) Interventions     Readmission Risk Interventions Readmission Risk Prevention Plan 11/29/2019  Post Dischage Appt Complete  Medication Screening Complete  Transportation Screening Complete  Some recent data might be hidden

## 2019-11-30 ENCOUNTER — Telehealth: Payer: Self-pay | Admitting: Family Medicine

## 2019-11-30 NOTE — Telephone Encounter (Signed)
Attempted to contact patient x2 on his mobile and home phone to follow up with patient regarding his recent hospitalization. Will try again

## 2019-12-01 ENCOUNTER — Telehealth: Payer: Self-pay | Admitting: *Deleted

## 2019-12-01 NOTE — Telephone Encounter (Signed)
Transition Care Management Follow-up Telephone Call Date of discharge and from where: 11/29/2019, [facility] How have you been since you were released from the hospital? States that he has been doing well since discharge. Denies any pain, N/V/D  Any questions or concerns? No   Items Reviewed: Did the pt receive and understand the discharge instructions provided? Yes  Medications obtained and verified? Yes  Any new allergies since your discharge? No  Dietary orders reviewed? Yes Do you have support at home? Yes   Functional Questionnaire: (I = Independent and D = Dependent) ADLs: I  Bathing/Dressing- I  Meal Prep- I  Eating- I  Maintaining continence- I  Transferring/Ambulation- I, with a walker   Managing Meds- I  Follow up appointments reviewed:  PCP Hospital f/u appt confirmed? No  Patient declined to schedule today would like to [facility] Iv f/u appt confirmed? Yes  [facility] Are transportation arrangements needed? No  If their condition worsens, is the pt aware to call PCP or go to the Emergency Dept.? Yes Was the patient provided with contact information for the PCP's office or ED? Yes Was to pt encouraged to call back with questions or concerns? Yes

## 2019-12-01 NOTE — Telephone Encounter (Signed)
Clinic RN spoke with patient. Patient reports he has to go to [place] on 12/06/2019 to get approval and referral to Neurologist. Patient reports [facility] will have [facility] so that Dr. Clent Ridges will know what is going on.

## 2019-12-06 ENCOUNTER — Encounter: Payer: Self-pay | Admitting: Neurology

## 2019-12-08 ENCOUNTER — Ambulatory Visit: Payer: No Typology Code available for payment source | Admitting: Family Medicine

## 2019-12-09 ENCOUNTER — Encounter: Payer: Self-pay | Admitting: Family Medicine

## 2019-12-09 ENCOUNTER — Other Ambulatory Visit: Payer: Self-pay

## 2019-12-09 ENCOUNTER — Ambulatory Visit (INDEPENDENT_AMBULATORY_CARE_PROVIDER_SITE_OTHER): Payer: No Typology Code available for payment source | Admitting: Family Medicine

## 2019-12-09 VITALS — BP 124/70 | HR 105 | Temp 98.1°F | Wt 190.8 lb

## 2019-12-09 DIAGNOSIS — M25552 Pain in left hip: Secondary | ICD-10-CM

## 2019-12-09 DIAGNOSIS — G40909 Epilepsy, unspecified, not intractable, without status epilepticus: Secondary | ICD-10-CM | POA: Diagnosis not present

## 2019-12-09 DIAGNOSIS — Z8673 Personal history of transient ischemic attack (TIA), and cerebral infarction without residual deficits: Secondary | ICD-10-CM | POA: Diagnosis not present

## 2019-12-09 DIAGNOSIS — G8929 Other chronic pain: Secondary | ICD-10-CM

## 2019-12-09 MED ORDER — OXYCODONE HCL 5 MG PO TABS: 5.0000 mg | ORAL_TABLET | Freq: Four times a day (QID) | ORAL | 0 refills | Status: AC | PRN

## 2019-12-09 NOTE — Progress Notes (Signed)
   Subjective:    Patient ID: Ryan Montgomery, male    DOB: 1955-10-28, 64 y.o.   MRN: [id number]  HPI Here to re-establish after a 6 year absence and to follow up a recent stay at [facility] hospital from 11-27-19 to 11-29-19 for an apparent seizure. On the day of admission he was with his family at a cookout when he suddenly slumped forward in his chair, become unresponsive for a few minutes, lost control of his urine, and had jerking movements of the arms and legs. At the ER his labs were unremarkable, although he tested positive for THC in the urine. A head CT scan showed several old strokes but nothing acute. He successfully stopped drinking alcohol about 9 months ago, but he admits to taking a few puffs on a marijuana cigarette shortly before he lost consciousness that day. Since then he has felt fine and has had no more neurologic events. He was told not to drive for 6 months and he was started on Keppra 500 mg BID. He also asks me today for a few Oxycodone pills to take for left hip pain. On 09-12-19 he underwent a left total hip arthroplasty at [facility], and he has been in a rehab program through the [place] system since then. They have supplied some pain medications for him here and there.    Review of Systems  Constitutional: Negative.   Respiratory: Negative.   Cardiovascular: Negative.   Musculoskeletal: Positive for arthralgias.  Neurological: Positive for seizures.       Objective:   Physical Exam Constitutional:      General: He is not in acute distress.    Comments: Walks with a cane   Cardiovascular:     Rate and Rhythm: Normal rate and regular rhythm.     Pulses: Normal pulses.     Heart sounds: Normal heart sounds.  Pulmonary:     Effort: Pulmonary effort is normal.     Breath sounds: Normal breath sounds.  Neurological:     Mental Status: He is alert.           Assessment & Plan:  He is recovering from what was likely a seizure related to smoking marijuana. He is doing  well. He will follow up with Dr. Karel Jarvis on 01-03-20. He will remain on Keppra at least until then. As for the hip pain, I sent in for #20 Oxycodone.  Gershon Crane, MD

## 2020-01-03 ENCOUNTER — Ambulatory Visit (INDEPENDENT_AMBULATORY_CARE_PROVIDER_SITE_OTHER): Payer: No Typology Code available for payment source | Admitting: Neurology

## 2020-01-03 ENCOUNTER — Other Ambulatory Visit: Payer: Self-pay

## 2020-01-03 ENCOUNTER — Encounter: Payer: Self-pay | Admitting: Neurology

## 2020-01-03 VITALS — BP 135/86 | HR 105 | Ht 71.0 in | Wt 194.0 lb

## 2020-01-03 DIAGNOSIS — R55 Syncope and collapse: Secondary | ICD-10-CM

## 2020-01-03 MED ORDER — LEVETIRACETAM 500 MG PO TABS: 500.0000 mg | ORAL_TABLET | Freq: Two times a day (BID) | ORAL | 6 refills | Status: DC
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
## 2020-01-18 ENCOUNTER — Telehealth: Payer: Self-pay | Admitting: *Deleted

## 2020-01-18 NOTE — Telephone Encounter (Signed)
LMOM requesting a call back to let us know the new appt time (9am same day) will work for him.

## 2020-01-24 NOTE — Telephone Encounter (Signed)
Contacted Mr. Wiechmann he said tomorrow at 98am will work well for him so we will keep his appointment at [facility]

## 2020-01-25 ENCOUNTER — Other Ambulatory Visit: Payer: No Typology Code available for payment source

## 2020-01-25 ENCOUNTER — Other Ambulatory Visit: Payer: Self-pay

## 2020-01-25 ENCOUNTER — Ambulatory Visit (INDEPENDENT_AMBULATORY_CARE_PROVIDER_SITE_OTHER): Payer: No Typology Code available for payment source | Admitting: Neurology

## 2020-01-25 DIAGNOSIS — R55 Syncope and collapse: Secondary | ICD-10-CM

## 2020-01-31 NOTE — Procedures (Signed)
ELECTROENCEPHALOGRAM REPORT  Dates of Recording: 01/25/2020 9:39AM to 01/26/2020 9:41AM  Patient's Name: Ryan Montgomery MRN: [id number] Date of Birth: [birth year]  Referring Provider: Dr. Patrcia Dolly  Procedure: 24-hour ambulatory video EEG  History: This is a 64 year old man with an episode of loss of consciousness in July with body jerking. EEG for classification.  Medications:  Keppra  Technical Summary: This is a 24-hour multichannel digital video EEG recording measured by the international 10-20 system with electrodes applied with paste and impedances below 5000 ohms performed as portable with EKG monitoring.  The digital EEG was referentially recorded, reformatted, and digitally filtered in a variety of bipolar and referential montages for optimal display.    DESCRIPTION OF RECORDING: During maximal wakefulness, the background activity consisted of a symmetric 9.5-10 Hz posterior dominant rhythm which was reactive to eye opening.  There were no epileptiform discharges or focal slowing seen in wakefulness.  During the recording, the patient progresses through wakefulness, drowsiness, and Stage 2 sleep.  Again, there were no epileptiform discharges seen.  Events: There were no push button events.  There were no electrographic seizures seen.  EKG lead at times showed sinus tachycardia up to 126 bpm.   IMPRESSION: This 24-hour ambulatory video EEG study is normal. Patient had periods of sinus tachycardia up to 126 bpm with no symptoms reported.  CLINICAL CORRELATION: A normal EEG does not exclude a clinical diagnosis of epilepsy.  If further clinical questions remain, inpatient video EEG monitoring may be helpful.   Patrcia Dolly, M.D.

## 2020-02-06 ENCOUNTER — Telehealth: Payer: Self-pay

## 2020-02-06 ENCOUNTER — Telehealth: Payer: Self-pay | Admitting: Neurology

## 2020-02-06 NOTE — Telephone Encounter (Signed)
Patient called and left a message requesting a call back with his recent EEG results.

## 2020-02-06 NOTE — Telephone Encounter (Signed)
Sent result note, thanks

## 2020-02-06 NOTE — Telephone Encounter (Signed)
-----   Message from Van Clines, MD sent at 02/06/2020  3:50 PM EDT ----- Pls let him know the EEG was normal. I think we can start reducing the Keppra, pls have him take 1 tablet daily until his f/u with me in January. Call for any changes in symptoms with reduction in medication. Also, his heart rate was going up during the EEG, has he seen a heart specialist? If not, pls have him talk to Dr. Clent Ridges if he thinks he should also see a cardiologist for the passing out episode. Thanks

## 2020-02-06 NOTE — Telephone Encounter (Signed)
See result note.  

## 2020-02-06 NOTE — Telephone Encounter (Signed)
Pt  Was called informed that EEG was normal.start reducing the Keppra, pls have him take 1 tablet daily until his f/u with Dr Karel Jarvis in January. Call for any changes in symptoms with reduction in medication. Also, his heart rate was going up during the EEG, has he seen a heart specialist? Pt has not seen a heart specialist,  Pt stated that he was smoking while on the EEG and didn't hit the button asking if that may have caused his heart rate to go up?  If not, pls have him talk to Dr. Clent Ridges if he thinks he should also see a cardiologist for the passing out episode. Pt stated that he will talk to Dr Clent Ridges about seeing a cardiologist for the passing out,

## 2020-02-20 ENCOUNTER — Ambulatory Visit (INDEPENDENT_AMBULATORY_CARE_PROVIDER_SITE_OTHER): Payer: No Typology Code available for payment source | Admitting: Family Medicine

## 2020-02-20 ENCOUNTER — Encounter: Payer: Self-pay | Admitting: Family Medicine

## 2020-02-20 ENCOUNTER — Other Ambulatory Visit: Payer: Self-pay

## 2020-02-20 VITALS — BP 140/80 | HR 77 | Temp 98.9°F | Ht 71.0 in | Wt 196.6 lb

## 2020-02-20 DIAGNOSIS — G40909 Epilepsy, unspecified, not intractable, without status epilepticus: Secondary | ICD-10-CM
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
## 2020-05-28 ENCOUNTER — Ambulatory Visit: Payer: No Typology Code available for payment source | Admitting: Neurology

## 2020-05-28 ENCOUNTER — Encounter: Payer: Self-pay | Admitting: Neurology

## 2020-05-28 DIAGNOSIS — Z029 Encounter for administrative examinations, unspecified: Secondary | ICD-10-CM

## 2020-09-09 ENCOUNTER — Encounter (HOSPITAL_COMMUNITY): Payer: Self-pay

## 2020-09-09 ENCOUNTER — Emergency Department (HOSPITAL_COMMUNITY): Payer: No Typology Code available for payment source

## 2020-09-09 ENCOUNTER — Observation Stay (HOSPITAL_COMMUNITY)
Admission: EM | Admit: 2020-09-09 | Discharge: 2020-09-10 | Disposition: A | Discharge status: Home / Self Care | Payer: No Typology Code available for payment source | Attending: General Surgery | Admitting: General Surgery

## 2020-09-09 ENCOUNTER — Other Ambulatory Visit: Payer: Self-pay

## 2020-09-09 DIAGNOSIS — S2220XA Unspecified fracture of sternum, initial encounter for closed fracture: Secondary | ICD-10-CM | POA: Diagnosis not present

## 2020-09-09 DIAGNOSIS — S2241XA Multiple fractures of ribs, right side, initial encounter for closed fracture: Secondary | ICD-10-CM | POA: Diagnosis not present

## 2020-09-09 DIAGNOSIS — Z96642 Presence of left artificial hip joint: Secondary | ICD-10-CM | POA: Insufficient documentation

## 2020-09-09 DIAGNOSIS — F17210 Nicotine dependence, cigarettes, uncomplicated: Secondary | ICD-10-CM | POA: Insufficient documentation

## 2020-09-09 DIAGNOSIS — Z20822 Contact with and (suspected) exposure to covid-19: Secondary | ICD-10-CM | POA: Insufficient documentation

## 2020-09-09 DIAGNOSIS — R1011 Right upper quadrant pain: Secondary | ICD-10-CM | POA: Insufficient documentation

## 2020-09-09 DIAGNOSIS — S2221XA Fracture of manubrium, initial encounter for closed fracture: Secondary | ICD-10-CM

## 2020-09-09 DIAGNOSIS — S2249XA Multiple fractures of ribs, unspecified side, initial encounter for closed fracture: Secondary | ICD-10-CM | POA: Diagnosis present

## 2020-09-09 DIAGNOSIS — Y92410 Unspecified street and highway as the place of occurrence of the external cause: Secondary | ICD-10-CM | POA: Insufficient documentation

## 2020-09-09 DIAGNOSIS — S299XXA Unspecified injury of thorax, initial encounter: Secondary | ICD-10-CM | POA: Diagnosis present

## 2020-09-09 LAB — URINALYSIS, ROUTINE W REFLEX MICROSCOPIC
Bacteria, UA: NONE SEEN
Bilirubin Urine: NEGATIVE
Glucose, UA: NEGATIVE mg/dL
Hgb urine dipstick: NEGATIVE
Ketones, ur: NEGATIVE mg/dL
Leukocytes,Ua: NEGATIVE
Nitrite: NEGATIVE
Protein, ur: 100 mg/dL — AB
Specific Gravity, Urine: 1.021 (ref 1.005–1.030)
pH: 5 (ref 5.0–8.0)

## 2020-09-09 LAB — COMPREHENSIVE METABOLIC PANEL
ALT: 21 U/L (ref 0–44)
AST: 27 U/L (ref 15–41)
Albumin: 4.3 g/dL (ref 3.5–5.0)
Alkaline Phosphatase: 68 U/L (ref 38–126)
Anion gap: 12 (ref 5–15)
BUN: 29 mg/dL — ABNORMAL HIGH (ref 8–23)
CO2: 22 mmol/L (ref 22–32)
Calcium: 9 mg/dL (ref 8.9–10.3)
Chloride: 102 mmol/L (ref 98–111)
Creatinine, Ser: 1.53 mg/dL — ABNORMAL HIGH (ref 0.61–1.24)
GFR, Estimated: 51 mL/min — ABNORMAL LOW (ref >60)
Glucose, Bld: 131 mg/dL — ABNORMAL HIGH (ref 70–99)
Potassium: 3.8 mmol/L (ref 3.5–5.1)
Sodium: 136 mmol/L (ref 135–145)
Total Bilirubin: 1.7 mg/dL — ABNORMAL HIGH (ref 0.3–1.2)
Total Protein: 9.7 g/dL — ABNORMAL HIGH (ref 6.5–8.1)

## 2020-09-09 LAB — I-STAT CHEM 8, ED
BUN: 32 mg/dL — ABNORMAL HIGH (ref 8–23)
Calcium, Ion: 1.16 mmol/L (ref 1.15–1.40)
Chloride: 105 mmol/L (ref 98–111)
Creatinine, Ser: 1.5 mg/dL — ABNORMAL HIGH (ref 0.61–1.24)
Glucose, Bld: 123 mg/dL — ABNORMAL HIGH (ref 70–99)
HCT: 54 % — ABNORMAL HIGH (ref 39.0–52.0)
Hemoglobin: 18.4 g/dL — ABNORMAL HIGH (ref 13.0–17.0)
Potassium: 3.8 mmol/L (ref 3.5–5.1)
Sodium: 142 mmol/L (ref 135–145)
TCO2: 25 mmol/L (ref 22–32)

## 2020-09-09 LAB — CBC WITH DIFFERENTIAL/PLATELET
Abs Immature Granulocytes: 1.7 10*3/uL — ABNORMAL HIGH (ref 0.00–0.07)
Band Neutrophils: 1 %
Basophils Absolute: 0 10*3/uL (ref 0.0–0.1)
Basophils Relative: 0 %
Eosinophils Absolute: 0 10*3/uL (ref 0.0–0.5)
Eosinophils Relative: 0 %
HCT: 44.8 % (ref 39.0–52.0)
Hemoglobin: 14.8 g/dL (ref 13.0–17.0)
Lymphocytes Relative: 27 %
Lymphs Abs: 4.1 10*3/uL — ABNORMAL HIGH (ref 0.7–4.0)
MCH: 31.9 pg (ref 26.0–34.0)
MCHC: 33 g/dL (ref 30.0–36.0)
MCV: 96.6 fL (ref 80.0–100.0)
Metamyelocytes Relative: 2 %
Monocytes Absolute: 0.5 10*3/uL (ref 0.1–1.0)
Monocytes Relative: 3 %
Myelocytes: 9 %
Neutro Abs: 9 10*3/uL — ABNORMAL HIGH (ref 1.7–7.7)
Neutrophils Relative %: 58 %
Platelets: 170 10*3/uL (ref 150–400)
RBC: 4.64 MIL/uL (ref 4.22–5.81)
RDW: 12.7 % (ref 11.5–15.5)
WBC: 15.3 10*3/uL — ABNORMAL HIGH (ref 4.0–10.5)
nRBC: 0 % (ref 0.0–0.2)

## 2020-09-09 LAB — RESP PANEL BY RT-PCR (FLU A&B, COVID) ARPGX2
Influenza A by PCR: NEGATIVE
Influenza B by PCR: NEGATIVE
SARS Coronavirus 2 by RT PCR: NEGATIVE
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
## 2020-09-09 MED ORDER — OXYCODONE HCL 5 MG PO TABS
5.0000 mg | ORAL_TABLET | Freq: Once | ORAL | Status: AC
  Administered 2020-09-09: 5 mg via ORAL
  Filled 2020-09-09: qty 1

## 2020-09-09 MED ORDER — FENTANYL CITRATE (PF) 100 MCG/2ML IJ SOLN
50.0000 ug | Freq: Once | INTRAMUSCULAR | Status: AC
  Administered 2020-09-09: 50 ug via INTRAVENOUS
  Filled 2020-09-09: qty 2

## 2020-09-09 MED ORDER — FENTANYL CITRATE (PF) 100 MCG/2ML IJ SOLN
25.0000 ug | Freq: Once | INTRAMUSCULAR | Status: AC
  Administered 2020-09-09: 25 ug via INTRAVENOUS
  Filled 2020-09-09: qty 2

## 2020-09-09 MED ORDER — HEPARIN SODIUM (PORCINE) 5000 UNIT/ML IJ SOLN
5000.0000 [IU] | Freq: Three times a day (TID) | INTRAMUSCULAR | Status: DC
  Administered 2020-09-09 – 2020-09-10 (×3): 5000 [IU] via SUBCUTANEOUS
  Filled 2020-09-09 (×3): qty 1

## 2020-09-09 MED ORDER — SODIUM CHLORIDE 0.9 % IV SOLN: INTRAVENOUS | Status: DC

## 2020-09-09 MED ORDER — OXYCODONE HCL 5 MG PO TABS
5.0000 mg | ORAL_TABLET | ORAL | Status: DC | PRN
  Administered 2020-09-10 (×2): 5 mg via ORAL
  Filled 2020-09-09 (×2): qty 1

## 2020-09-09 MED ORDER — SODIUM CHLORIDE 0.9 % IV BOLUS
500.0000 mL | Freq: Once | INTRAVENOUS | Status: AC
  Administered 2020-09-09: 500 mL via INTRAVENOUS

## 2020-09-09 MED ORDER — PANTOPRAZOLE SODIUM 40 MG PO TBEC
40.0000 mg | DELAYED_RELEASE_TABLET | Freq: Every day | ORAL | Status: DC
  Administered 2020-09-10: 40 mg via ORAL
  Filled 2020-09-09: qty 1

## 2020-09-09 MED ORDER — ONDANSETRON 4 MG PO TBDP: 4.0000 mg | ORAL_TABLET | Freq: Four times a day (QID) | ORAL | Status: DC | PRN

## 2020-09-09 MED ORDER — MORPHINE SULFATE (PF) 2 MG/ML IV SOLN
1.0000 mg | INTRAVENOUS | Status: DC | PRN
  Administered 2020-09-10 (×2): 1 mg via INTRAVENOUS
  Filled 2020-09-09: qty 1

## 2020-09-09 MED ORDER — PANTOPRAZOLE SODIUM 40 MG IV SOLR
40.0000 mg | Freq: Every day | INTRAVENOUS | Status: DC
  Filled 2020-09-09: qty 40

## 2020-09-09 MED ORDER — LEVETIRACETAM 500 MG PO TABS
500.0000 mg | ORAL_TABLET | Freq: Two times a day (BID) | ORAL | Status: DC
  Filled 2020-09-09 (×2): qty 1

## 2020-09-09 MED ORDER — ONDANSETRON HCL 4 MG/2ML IJ SOLN: 4.0000 mg | Freq: Four times a day (QID) | INTRAMUSCULAR | Status: DC | PRN

## 2020-09-09 MED ORDER — ONDANSETRON HCL 4 MG/2ML IJ SOLN
4.0000 mg | Freq: Once | INTRAMUSCULAR | Status: AC
  Administered 2020-09-09: 4 mg via INTRAVENOUS
  Filled 2020-09-09: qty 2

## 2020-09-09 MED ORDER — IOHEXOL 300 MG/ML  SOLN
100.0000 mL | Freq: Once | INTRAMUSCULAR | Status: AC | PRN
  Administered 2020-09-09: 100 mL via INTRAVENOUS

## 2020-09-09 NOTE — ED Notes (Signed)
Patient was placed on 2L [place]. Patient was readjusted in bed. Patient is aware he is being transferred to [facility].
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
## 2020-09-09 NOTE — ED Notes (Signed)
1st contact with patient. Patient decreased pain. Placed patient on cardiac monitor.

## 2020-09-09 NOTE — ED Notes (Signed)
Report called to RN.
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
## 2020-09-10 LAB — COMPREHENSIVE METABOLIC PANEL
ALT: 19 U/L (ref 0–44)
AST: 28 U/L (ref 15–41)
Albumin: 3.3 g/dL — ABNORMAL LOW (ref 3.5–5.0)
Alkaline Phosphatase: 63 U/L (ref 38–126)
Anion gap: 6 (ref 5–15)
BUN: 19 mg/dL (ref 8–23)
CO2: 24 mmol/L (ref 22–32)
Calcium: 8.5 mg/dL — ABNORMAL LOW (ref 8.9–10.3)
Chloride: 104 mmol/L (ref 98–111)
Creatinine, Ser: 1.42 mg/dL — ABNORMAL HIGH (ref 0.61–1.24)
GFR, Estimated: 56 mL/min — ABNORMAL LOW (ref >60)
Glucose, Bld: 192 mg/dL — ABNORMAL HIGH (ref 70–99)
Potassium: 3.1 mmol/L — ABNORMAL LOW (ref 3.5–5.1)
Sodium: 134 mmol/L — ABNORMAL LOW (ref 135–145)
Total Bilirubin: 1.9 mg/dL — ABNORMAL HIGH (ref 0.3–1.2)
Total Protein: 8.2 g/dL — ABNORMAL HIGH (ref 6.5–8.1)

## 2020-09-10 LAB — CBC
HCT: 41.5 % (ref 39.0–52.0)
Hemoglobin: 13.6 g/dL (ref 13.0–17.0)
MCH: 31.6 pg (ref 26.0–34.0)
MCHC: 32.8 g/dL (ref 30.0–36.0)
MCV: 96.3 fL (ref 80.0–100.0)
Platelets: 149 10*3/uL — ABNORMAL LOW (ref 150–400)
RBC: 4.31 MIL/uL (ref 4.22–5.81)
RDW: 12.4 % (ref 11.5–15.5)
WBC: 10.5 10*3/uL (ref 4.0–10.5)
nRBC: 0 % (ref 0.0–0.2)

## 2020-09-10 MED ORDER — OXYCODONE HCL 5 MG PO TABS
5.0000 mg | ORAL_TABLET | ORAL | Status: DC | PRN
  Administered 2020-09-10: 10 mg via ORAL
  Filled 2020-09-10: qty 2

## 2020-09-10 MED ORDER — IBUPROFEN 400 MG PO TABS
600.0000 mg | ORAL_TABLET | Freq: Three times a day (TID) | ORAL | Status: DC
  Administered 2020-09-10 (×2): 600 mg via ORAL
  Filled 2020-09-10 (×2): qty 1

## 2020-09-10 MED ORDER — ACETAMINOPHEN 500 MG PO TABS: 1000.0000 mg | ORAL_TABLET | Freq: Four times a day (QID) | ORAL | Status: DC | PRN

## 2020-09-10 MED ORDER — METHOCARBAMOL 500 MG PO TABS: 1000.0000 mg | ORAL_TABLET | Freq: Three times a day (TID) | ORAL | 0 refills | Status: DC | PRN

## 2020-09-10 MED ORDER — POTASSIUM CHLORIDE CRYS ER 20 MEQ PO TBCR
20.0000 meq | EXTENDED_RELEASE_TABLET | Freq: Three times a day (TID) | ORAL | Status: DC
  Administered 2020-09-10 (×2): 20 meq via ORAL
  Filled 2020-09-10 (×2): qty 1

## 2020-09-10 MED ORDER — ACETAMINOPHEN 500 MG PO TABS
1000.0000 mg | ORAL_TABLET | Freq: Three times a day (TID) | ORAL | Status: DC
  Administered 2020-09-10: 1000 mg via ORAL
  Filled 2020-09-10: qty 2

## 2020-09-10 MED ORDER — METHOCARBAMOL 500 MG PO TABS
1000.0000 mg | ORAL_TABLET | Freq: Three times a day (TID) | ORAL | Status: DC
  Administered 2020-09-10 (×2): 1000 mg via ORAL
  Filled 2020-09-10 (×2): qty 2

## 2020-09-10 MED ORDER — OXYCODONE HCL 5 MG PO TABS: 5.0000 mg | ORAL_TABLET | Freq: Four times a day (QID) | ORAL | 0 refills | Status: DC | PRN

## 2020-09-10 MED ORDER — MORPHINE SULFATE (PF) 2 MG/ML IV SOLN: 1.0000 mg | INTRAVENOUS | Status: DC | PRN

## 2020-09-10 NOTE — Progress Notes (Signed)
RN gave pt discharge instructions and the patient stated understanding, IV has been removed and the patients medications have been escribed to home pharmacy. Pt given IS upon DC and stated he knows how to use it.

## 2020-09-10 NOTE — Progress Notes (Signed)
   09/10/20 0627  Pressure Injury Prevention  Positioning Frequency Able to turn self  Mobility  Activity Ambulated in room;Dangled on edge of bed;Transferred:  Bed to chair;Repositioned in chair  Range of Motion/Exercises Active;All extremities  Level of Assistance Minimal assist, patient does 75% or more  Assistive Device None  Distance Ambulated (ft) 20 ft  Mobility Response Tolerated well  Mobility performed by Nurse  Hygiene  Hygiene Peri care  Level of Assistance Minimal assist    Patient up to chair with minimal assistance from RN. Patient continue to sat in the high 90s on RA. He still c/o pain at the same area (right upper flank toward chest when coughing or laughing. Pain med admin. Emotional support and encouragement provided. Possessions within reach . Will cont to monitor.
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
## 2020-09-10 NOTE — ED Notes (Signed)
[facility] here for transport.  
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
## 2020-09-10 NOTE — TOC CAGE-AID Note (Signed)
Transition of Care [facility]) - CAGE-AID Screening   Patient Details  Name: Ryan Montgomery MRN: [id number] Date of Birth: 01/25/1956   Hewitt Shorts, RN Trauma Response Nurse Phone Number: 09/10/2020, 12:24 PM       CAGE-AID Screening:    Have You Ever Felt You Ought to Cut Down on Your Drinking or Drug Use?: No Have People Annoyed You By [employer] Your Drinking Or Drug Use?: No Have You Felt Bad Or Guilty About Your Drinking Or Drug Use?: No Have You Ever Had a Drink or Used Drugs First Thing In The Morning to Steady Your Nerves or to Get Rid of a Hangover?: No CAGE-AID Score: 0  Substance Abuse Education Offered: No (Denies alcohol or drug use. states "I quit drinking 2 years ago")
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
## 2020-09-10 NOTE — Progress Notes (Signed)
[facility]. Safety precautions and orders reviewed with patient. He c/o  Right upper flank toward chest pain 10/10 post ambulating from stretcher to bed. Pain med admin. Possessions within reach.  POC: Frequent rounds  Pain management  Cont to provide emotional support and encouragement.
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
## 2020-09-10 NOTE — ED Notes (Signed)
Patient leaving with transport.

## 2020-09-19 ENCOUNTER — Encounter: Payer: Self-pay | Admitting: Family Medicine

## 2020-09-19 ENCOUNTER — Ambulatory Visit (INDEPENDENT_AMBULATORY_CARE_PROVIDER_SITE_OTHER): Payer: No Typology Code available for payment source | Admitting: Family Medicine

## 2020-09-19 ENCOUNTER — Other Ambulatory Visit: Payer: Self-pay

## 2020-09-19 VITALS — BP 130/80 | HR 102 | Temp 98.6°F | Wt 195.2 lb

## 2020-09-19 DIAGNOSIS — S2249XD Multiple fractures of ribs, unspecified side, subsequent encounter for fracture with routine healing: Secondary | ICD-10-CM | POA: Diagnosis not present

## 2020-09-19 DIAGNOSIS — S2222XD Fracture of body of sternum, subsequent encounter for fracture with routine healing: Secondary | ICD-10-CM | POA: Diagnosis not present

## 2020-09-19 DIAGNOSIS — S2249XA Multiple fractures of ribs, unspecified side, initial encounter for closed fracture: Secondary | ICD-10-CM

## 2020-09-19 MED ORDER — OXYCODONE HCL 5 MG PO TABS: 10.0000 mg | ORAL_TABLET | ORAL | 0 refills | Status: AC | PRN
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
## 2020-10-29 ENCOUNTER — Telehealth: Payer: Self-pay | Admitting: Family Medicine

## 2020-10-29 NOTE — Telephone Encounter (Signed)
Patient is calling and is requesting a refill for oxycodone (not on med list) for shoulder pain to be sent to  [facility] [place], [place] - [address] AT [facility] [address]  [address] [place] [address]  Phone:  [phone] Fax:  [phone]  CB is [phone]

## 2020-10-30 NOTE — Telephone Encounter (Signed)
He will need another in person OV for narcotic refills

## 2020-10-30 NOTE — Telephone Encounter (Signed)
This patient has been on Oxycodone 5mg , immediate release tablet.

## 2020-10-30 NOTE — Telephone Encounter (Signed)
Left voicemail for pt to call and schedule in office appointment

## 2020-10-31 NOTE — Telephone Encounter (Signed)
Pt is scheduled for OV on 11/02/2020

## 2020-11-02 ENCOUNTER — Other Ambulatory Visit: Payer: Self-pay

## 2020-11-02 ENCOUNTER — Telehealth: Payer: Self-pay

## 2020-11-02 ENCOUNTER — Encounter: Payer: Self-pay | Admitting: Family Medicine

## 2020-11-02 ENCOUNTER — Ambulatory Visit (INDEPENDENT_AMBULATORY_CARE_PROVIDER_SITE_OTHER): Payer: No Typology Code available for payment source | Admitting: Family Medicine

## 2020-11-02 VITALS — BP 128/80 | HR 75 | Temp 98.7°F | Wt 201.0 lb

## 2020-11-02 DIAGNOSIS — M25511 Pain in right shoulder: Secondary | ICD-10-CM

## 2020-11-02 DIAGNOSIS — G8929 Other chronic pain: Secondary | ICD-10-CM

## 2020-11-02 MED ORDER — OXYCODONE HCL 10 MG PO TABS: 10.0000 mg | ORAL_TABLET | Freq: Four times a day (QID) | ORAL | 0 refills | Status: AC | PRN
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
## 2020-11-02 NOTE — Telephone Encounter (Signed)
Pt was seen at the office today

## 2020-12-28 ENCOUNTER — Other Ambulatory Visit: Payer: Self-pay

## 2020-12-28 ENCOUNTER — Encounter (HOSPITAL_COMMUNITY): Payer: Self-pay

## 2020-12-28 ENCOUNTER — Emergency Department (HOSPITAL_COMMUNITY)
Admission: EM | Admit: 2020-12-28 | Discharge: 2020-12-28 | Disposition: A | Discharge status: Home / Self Care | Payer: No Typology Code available for payment source | Attending: Emergency Medicine | Admitting: Emergency Medicine

## 2020-12-28 ENCOUNTER — Emergency Department (HOSPITAL_COMMUNITY): Payer: No Typology Code available for payment source

## 2020-12-28 DIAGNOSIS — F17210 Nicotine dependence, cigarettes, uncomplicated: Secondary | ICD-10-CM | POA: Insufficient documentation

## 2020-12-28 DIAGNOSIS — R39198 Other difficulties with micturition: Secondary | ICD-10-CM

## 2020-12-28 DIAGNOSIS — Z96642 Presence of left artificial hip joint: Secondary | ICD-10-CM | POA: Diagnosis not present

## 2020-12-28 DIAGNOSIS — R350 Frequency of micturition: Secondary | ICD-10-CM | POA: Diagnosis present

## 2020-12-28 LAB — CBC WITH DIFFERENTIAL/PLATELET
Abs Immature Granulocytes: 0.02 10*3/uL (ref 0.00–0.07)
Basophils Absolute: 0.1 10*3/uL (ref 0.0–0.1)
Basophils Relative: 1 %
Eosinophils Absolute: 0.1 10*3/uL (ref 0.0–0.5)
Eosinophils Relative: 1 %
HCT: 46.7 % (ref 39.0–52.0)
Hemoglobin: 15.1 g/dL (ref 13.0–17.0)
Immature Granulocytes: 0 %
Lymphocytes Relative: 65 %
Lymphs Abs: 5.8 10*3/uL — ABNORMAL HIGH (ref 0.7–4.0)
MCH: 31.7 pg (ref 26.0–34.0)
MCHC: 32.3 g/dL (ref 30.0–36.0)
MCV: 97.9 fL (ref 80.0–100.0)
Monocytes Absolute: 0.5 10*3/uL (ref 0.1–1.0)
Monocytes Relative: 5 %
Neutro Abs: 2.6 10*3/uL (ref 1.7–7.7)
Neutrophils Relative %: 28 %
Platelets: 200 10*3/uL (ref 150–400)
RBC: 4.77 MIL/uL (ref 4.22–5.81)
RDW: 13.1 % (ref 11.5–15.5)
WBC: 9 10*3/uL (ref 4.0–10.5)
nRBC: 0 % (ref 0.0–0.2)

## 2020-12-28 LAB — COMPREHENSIVE METABOLIC PANEL
ALT: 14 U/L (ref 0–44)
AST: 17 U/L (ref 15–41)
Albumin: 4.2 g/dL (ref 3.5–5.0)
Alkaline Phosphatase: 87 U/L (ref 38–126)
Anion gap: 7 (ref 5–15)
BUN: 16 mg/dL (ref 8–23)
CO2: 27 mmol/L (ref 22–32)
Calcium: 9 mg/dL (ref 8.9–10.3)
Chloride: 108 mmol/L (ref 98–111)
Creatinine, Ser: 1.28 mg/dL — ABNORMAL HIGH (ref 0.61–1.24)
GFR, Estimated: >60 mL/min (ref >60)
Glucose, Bld: 82 mg/dL (ref 70–99)
Potassium: 4.1 mmol/L (ref 3.5–5.1)
Sodium: 142 mmol/L (ref 135–145)
Total Bilirubin: 0.8 mg/dL (ref 0.3–1.2)
Total Protein: 8.7 g/dL — ABNORMAL HIGH (ref 6.5–8.1)

## 2020-12-28 LAB — URINALYSIS, ROUTINE W REFLEX MICROSCOPIC
Bacteria, UA: NONE SEEN
Bilirubin Urine: NEGATIVE
Glucose, UA: NEGATIVE mg/dL
Ketones, ur: NEGATIVE mg/dL
Leukocytes,Ua: NEGATIVE
Nitrite: NEGATIVE
Protein, ur: NEGATIVE mg/dL
Specific Gravity, Urine: 1.025 (ref 1.005–1.030)
pH: 5 (ref 5.0–8.0)

## 2020-12-28 LAB — TROPONIN I (HIGH SENSITIVITY): Troponin I (High Sensitivity): 4 ng/L (ref <18)

## 2020-12-28 IMAGING — DX DG CHEST 1V PORT
1 series · 1 of 1 positions shown · non-contrast
Comparison: [DATE].

CLINICAL DATA: Shortness of breath, shortness of breath with
urinary complaints over the past 3 weeks.

EXAM:
PORTABLE CHEST 1 VIEW

[chest ap]
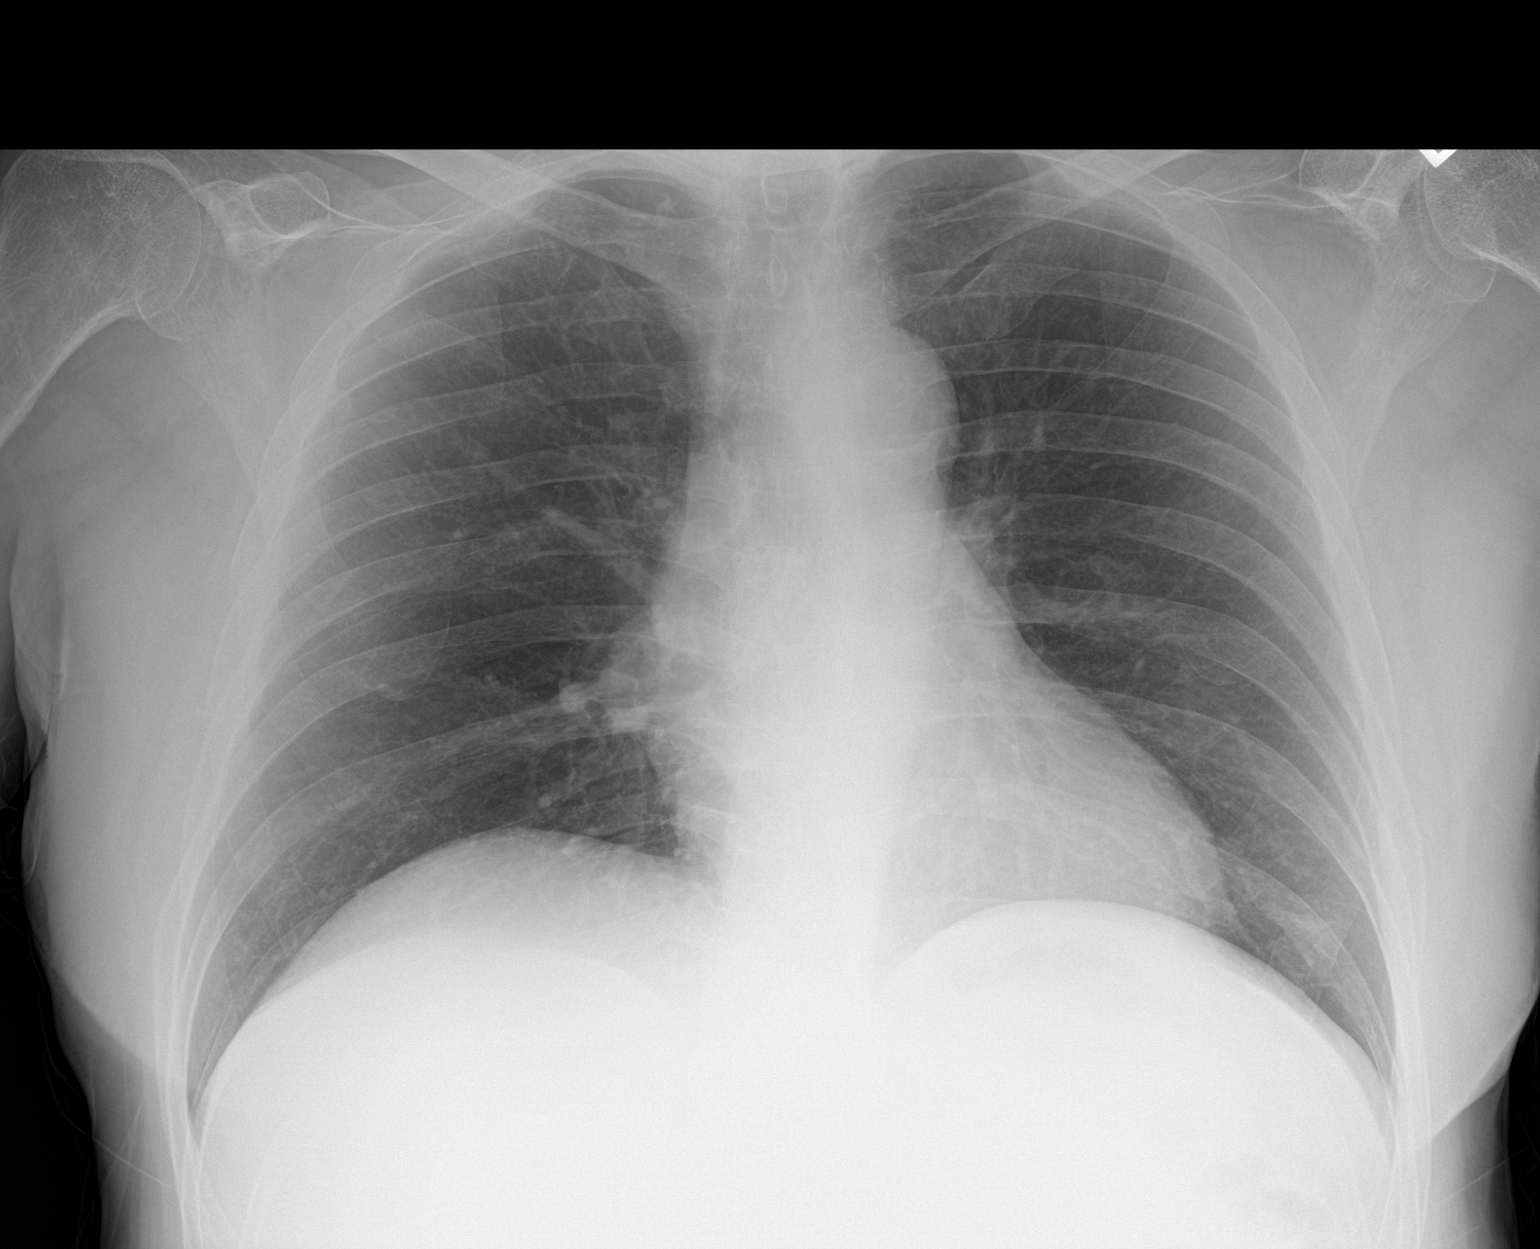

[1 of 1 positions shown; findings below may reference images not displayed]

FINDINGS: Cardiomediastinal contours and hilar structures are normal.

Lungs are clear.  No effusion on frontal radiograph.

No pneumothorax.

On limited assessment there is no acute skeletal process.
IMPRESSION: No acute cardiopulmonary disease.

## 2020-12-28 MED ORDER — TAMSULOSIN HCL 0.4 MG PO CAPS: 0.4000 mg | ORAL_CAPSULE | Freq: Every day | ORAL | 0 refills | Status: DC

## 2020-12-28 NOTE — ED Triage Notes (Addendum)
Nausea after urinating and frequent urination and dribbling x3 months but  Denies difficulty urination, fever, hematuria, discharge. Dizziness and shob this am.

## 2020-12-28 NOTE — ED Provider Notes (Signed)
[facility]-EMERGENCY DEPT Provider Note   CSN: [id number] Arrival date & time: 12/28/20  1506     History Chief Complaint  Patient presents with   Urinary Frequency   Nausea    Ryan Montgomery is a 65 y.o. male.  65 yo M with a chief complaint of difficulty urinating.  The patient states that this has been ongoing for some time.  Usually wakes up first thing in the morning and has some difficulty initiating a stream.  Patient states that this morning he was actually unable to urinate at all.  Was unable to urinate until this afternoon and has been able to have significant amounts of urine output since about 3 PM.  He denies any fevers denies flank pain denies low back pain.  Denies blood in his urine.  Denies trauma.  Feels much better now.  [facility]  The history is provided by the patient.  Urinary Frequency Pertinent negatives include no chest pain, no abdominal pain, no headaches and no shortness of breath.  Illness Severity:  Moderate Onset quality:  Gradual Duration:  2 days Timing:  Constant Progression:  Worsening Chronicity:  New Associated symptoms: no abdominal pain, no chest pain, no congestion, no diarrhea, no fever, no headaches, no myalgias, no rash, no shortness of breath and no vomiting       Past Medical History:  Diagnosis Date   Chickenpox    Chickenpox    Hepatitis C     Patient Active Problem List   Diagnosis Date Noted   Rib fractures 09/09/2020   Chronic left hip pain 12/09/2019   Seizure disorder (HCC)    Syncope 11/27/2019   History of CVA (cerebrovascular accident) 2018   Hepatitis C 07/18/2013    Past Surgical History:  Procedure Laterality Date   colonoscopy  08-28-08   per Dr. Jarold Motto, clear, repeat in 10 yrs    fx rt ankle     pins placed   JOINT REPLACEMENT Left 09/12/2019   TOTAL HIP ARTHROPLASTY Left 09/12/2019   per Dr. Josefine Class at [facility]        Family  History  Problem Relation Age of Onset   Hypertension Other    Lung cancer Other     Social History   Tobacco Use   Smoking status: Every Day    Packs/day: 0.50    Types: Cigarettes   Smokeless tobacco: Never  Vaping Use   Vaping Use: Never used  Substance Use Topics   Alcohol use: Not Currently   Drug use: No    Home Medications Prior to Admission medications   Medication Sig Start Date End Date Taking? Authorizing Provider  acetaminophen (TYLENOL) 500 MG tablet Take 2 tablets (1,000 mg total) by mouth every 6 (six) hours as needed for headache (pain). 09/10/20  Yes Juliet Rude, PA-C  tamsulosin (FLOMAX) 0.4 MG CAPS capsule Take 1 capsule (0.4 mg total) by mouth daily after supper. 12/28/20  Yes Melene Plan, DO    Allergies    Moxifloxacin  Review of Systems   Review of Systems  Constitutional:  Negative for chills and fever.  HENT:  Negative for congestion and facial swelling.   Eyes:  Negative for discharge and visual disturbance.  Respiratory:  Negative for shortness of breath.   Cardiovascular:  Negative for chest pain and palpitations.  Gastrointestinal:  Negative for abdominal pain, diarrhea and vomiting.  Genitourinary:  Positive for frequency.  Musculoskeletal:  Negative for arthralgias and myalgias.  Skin:  Negative for color change and rash.  Neurological:  Negative for tremors, syncope and headaches.  Psychiatric/Behavioral:  Negative for confusion and dysphoric mood.    Physical Exam Updated Vital Signs BP (!) 157/97 (BP Location: Left Arm)   Pulse 81   Temp 98.2 F (36.8 C) (Oral)   Resp 18   Ht 5\' 11"  (1.803 m)   Wt 91 kg   SpO2 100%   BMI 27.98 kg/m   Physical Exam Vitals and nursing note reviewed.  Constitutional:      Appearance: He is well-developed.  HENT:     Head: Normocephalic and atraumatic.  Eyes:     Pupils: Pupils are equal, round, and reactive to light.  Neck:     Vascular: No JVD.  Cardiovascular:     Rate and Rhythm:  Normal rate and regular rhythm.     Heart sounds: No murmur heard.   No friction rub. No gallop.  Pulmonary:     Effort: No respiratory distress.     Breath sounds: No wheezing.  Abdominal:     General: There is no distension.     Tenderness: There is no abdominal tenderness. There is no guarding or rebound.  Musculoskeletal:        General: Normal range of motion.     Cervical back: Normal range of motion and neck supple.  Skin:    Coloration: Skin is not pale.     Findings: No rash.  Neurological:     Mental Status: He is alert and oriented to person, place, and time.  Psychiatric:        Behavior: Behavior normal.    ED Results / Procedures / Treatments   Labs (all labs ordered are listed, but only abnormal results are displayed) Labs Reviewed  COMPREHENSIVE METABOLIC PANEL - Abnormal; Notable for the following components:      Result Value   Creatinine, Ser 1.28 (*)    Total Protein 8.7 (*)    All other components within normal limits  CBC WITH DIFFERENTIAL/PLATELET - Abnormal; Notable for the following components:   Lymphs Abs 5.8 (*)    All other components within normal limits  URINALYSIS, ROUTINE W REFLEX MICROSCOPIC - Abnormal; Notable for the following components:   Hgb urine dipstick SMALL (*)    All other components within normal limits  TROPONIN I (HIGH SENSITIVITY)    EKG EKG Interpretation  Date/Time:  Friday December 28 2020 17:22:22 EDT Ventricular Rate:  92 PR Interval:  180 QRS Duration: 78 QT Interval:  350 QTC Calculation: 432 R Axis:   61 Text Interpretation: Normal sinus rhythm Nonspecific ST abnormality Abnormal ECG No significant change since last tracing Confirmed by 01-25-1973 [id number]) on 12/28/2020 9:50:37 PM  Radiology DG Chest Portable 1 View  Result Date: 12/28/2020 CLINICAL DATA:  Shortness of breath, shortness of breath with urinary complaints over the past 3 weeks. EXAM: PORTABLE CHEST 1 VIEW COMPARISON:  September 09, 2020. FINDINGS:  Cardiomediastinal contours and hilar structures are normal. Lungs are clear.  No effusion on frontal radiograph. No pneumothorax. On limited assessment there is no acute skeletal process. IMPRESSION: No acute cardiopulmonary disease. Electronically Signed   By: September 11, 2020 M.D.   On: 12/28/2020 16:35    Procedures Procedures   Medications Ordered in ED Medications - No data to display  ED Course  I have reviewed the triage vital signs and the nursing notes.  Pertinent labs & imaging results  that were available during my care of the patient were reviewed by me and considered in my medical decision making (see chart for details).    MDM Rules/Calculators/A&P                           65 yo M with a chief complaints of difficulty urinating.  Going on for some time but worsening today.  Was unable to urinate for about 12 hours and was spontaneously able to urinate in the waiting room.  Lab work without significant renal dysfunction.  Awaiting for UA.  UA without infection.  D/c home.  Flomax.   10:53 PM:  I have discussed the diagnosis/risks/treatment options with the patient and believe the pt to be eligible for discharge home to follow-up with PCP, urology. We also discussed returning to the ED immediately if new or worsening sx occur. We discussed the sx which are most concerning (e.g., sudden worsening pain, fever, inability to tolerate by mouth) that necessitate immediate return. Medications administered to the patient during their visit and any new prescriptions provided to the patient are listed below.  Medications given during this visit Medications - No data to display   The patient appears reasonably screen and/or stabilized for discharge and I doubt any other medical condition or other [facility] requiring further screening, evaluation, or treatment in the ED at this time prior to discharge.   Final Clinical Impression(s) / ED Diagnoses Final diagnoses:  Difficulty urinating     Rx / DC Orders ED Discharge Orders          Ordered    tamsulosin (FLOMAX) 0.4 MG CAPS capsule  Daily after supper        12/28/20 2251             Melene Plan, DO 12/28/20 2253

## 2020-12-28 NOTE — ED Provider Notes (Signed)
Emergency Medicine Provider Triage Evaluation Note  Ryan Montgomery , a 65 y.o. male  was evaluated in triage.  Pt complains of urinary complaints for the last three works worsening today, denies difficulty urinating. States that he gets nasueous every time he urinates. States that today he got dizzy and SOB after urinating today, no CP. Is not SOB or dizzy now, states that he is primarly here for nausea and urinary complaints. No fevers or back pain.   Review of Systems  Positive: Urinary frequency, nausea  Negative: Back pain, fevers   Physical Exam  BP (!) 133/98 (BP Location: Right Arm)   Pulse (!) 115   Temp 98 F (36.7 C) (Oral)   Resp 18   Ht 5\' 11"  (1.803 m)   Wt 91 kg   SpO2 95%   BMI 27.98 kg/m  Gen:   Awake, no distress   Resp:  Normal effort  MSK:   Moves extremities without difficulty  Other:  Tachycardic   Medical Decision Making  Medically screening exam initiated at 3:59 PM.  Appropriate orders placed.  Ryan Montgomery was informed that the remainder of the evaluation will be completed by another provider, this initial triage assessment does not replace that evaluation, and the importance of remaining in the ED until their evaluation is complete.     Ryan Newcomer, PA-C 12/28/20 02/27/21    [id number], MD 01/01/21 1323

## 2020-12-28 NOTE — Discharge Instructions (Addendum)
Return for inability to urinate.  Follow up with urology in the office.   I have prescribed you a medicine to hopefully help you with this problem.  It can make you lightheaded when you stand up.

## 2021-02-18 ENCOUNTER — Ambulatory Visit: Payer: No Typology Code available for payment source | Admitting: Neurology

## 2021-05-14 ENCOUNTER — Ambulatory Visit (INDEPENDENT_AMBULATORY_CARE_PROVIDER_SITE_OTHER): Payer: No Typology Code available for payment source | Admitting: Family Medicine

## 2021-05-14 ENCOUNTER — Encounter: Payer: Self-pay | Admitting: Family Medicine

## 2021-05-14 VITALS — BP 150/102 | HR 107 | Temp 98.7°F | Wt 183.0 lb

## 2021-05-14 DIAGNOSIS — M79672 Pain in left foot: Secondary | ICD-10-CM

## 2021-05-14 DIAGNOSIS — G8929 Other chronic pain: Secondary | ICD-10-CM

## 2021-05-14 MED ORDER — DICLOFENAC SODIUM 75 MG PO TBEC: 75.0000 mg | DELAYED_RELEASE_TABLET | Freq: Two times a day (BID) | ORAL | 2 refills | Status: DC

## 2021-05-14 NOTE — Progress Notes (Signed)
° °  Subjective:    Patient ID: Ryan Montgomery, male    DOB: 09-30-1955, 65 y.o.   MRN: [id number]  HPI Here to discuss a pain in the left foot that started 3 years ago. It comes and goes. There is no swelling or warmth or redness. The pain is centered between the 4th and 5th toes.    Review of Systems  Constitutional: Negative.   Respiratory: Negative.    Cardiovascular: Negative.   Musculoskeletal:  Positive for arthralgias.      Objective:   Physical Exam Constitutional:      Appearance: Normal appearance.  Cardiovascular:     Rate and Rhythm: Normal rate and regular rhythm.     Pulses: Normal pulses.     Heart sounds: Normal heart sounds.  Pulmonary:     Effort: Pulmonary effort is normal.     Breath sounds: Normal breath sounds.  Musculoskeletal:     Comments: The left foot appears normal. He is mildly tender along the 4th and 5th metatarsals   Neurological:     Mental Status: He is alert.          Assessment & Plan:  Foot pain. He can use Diclofenac as needed. Refer to Podiatry. Gershon Crane, MD

## 2021-06-03 ENCOUNTER — Emergency Department (HOSPITAL_COMMUNITY): Payer: No Typology Code available for payment source

## 2021-06-03 ENCOUNTER — Inpatient Hospital Stay (HOSPITAL_COMMUNITY)
Admission: EM | Admit: 2021-06-03 | Discharge: 2021-06-14 | DRG: 040 | Disposition: A | Discharge status: Skilled Nursing Facility | Payer: No Typology Code available for payment source | Attending: Family Medicine | Admitting: Family Medicine

## 2021-06-03 ENCOUNTER — Other Ambulatory Visit: Payer: Self-pay

## 2021-06-03 ENCOUNTER — Encounter (HOSPITAL_COMMUNITY): Payer: Self-pay

## 2021-06-03 DIAGNOSIS — R1310 Dysphagia, unspecified: Secondary | ICD-10-CM | POA: Diagnosis present

## 2021-06-03 DIAGNOSIS — R002 Palpitations: Secondary | ICD-10-CM | POA: Diagnosis not present

## 2021-06-03 DIAGNOSIS — Z8249 Family history of ischemic heart disease and other diseases of the circulatory system: Secondary | ICD-10-CM

## 2021-06-03 DIAGNOSIS — Z8673 Personal history of transient ischemic attack (TIA), and cerebral infarction without residual deficits: Secondary | ICD-10-CM | POA: Diagnosis not present

## 2021-06-03 DIAGNOSIS — Z7982 Long term (current) use of aspirin: Secondary | ICD-10-CM

## 2021-06-03 DIAGNOSIS — I63411 Cerebral infarction due to embolism of right middle cerebral artery: Principal | ICD-10-CM | POA: Diagnosis present

## 2021-06-03 DIAGNOSIS — Z881 Allergy status to other antibiotic agents status: Secondary | ICD-10-CM

## 2021-06-03 DIAGNOSIS — B1920 Unspecified viral hepatitis C without hepatic coma: Secondary | ICD-10-CM | POA: Diagnosis present

## 2021-06-03 DIAGNOSIS — Q2112 Patent foramen ovale: Secondary | ICD-10-CM | POA: Diagnosis not present

## 2021-06-03 DIAGNOSIS — I77819 Aortic ectasia, unspecified site: Secondary | ICD-10-CM | POA: Diagnosis not present

## 2021-06-03 DIAGNOSIS — Z720 Tobacco use: Secondary | ICD-10-CM

## 2021-06-03 DIAGNOSIS — I129 Hypertensive chronic kidney disease with stage 1 through stage 4 chronic kidney disease, or unspecified chronic kidney disease: Secondary | ICD-10-CM | POA: Diagnosis present

## 2021-06-03 DIAGNOSIS — I480 Paroxysmal atrial fibrillation: Secondary | ICD-10-CM | POA: Diagnosis present

## 2021-06-03 DIAGNOSIS — Z20822 Contact with and (suspected) exposure to covid-19: Secondary | ICD-10-CM | POA: Diagnosis present

## 2021-06-03 DIAGNOSIS — E860 Dehydration: Secondary | ICD-10-CM | POA: Diagnosis present

## 2021-06-03 DIAGNOSIS — M79641 Pain in right hand: Secondary | ICD-10-CM | POA: Diagnosis present

## 2021-06-03 DIAGNOSIS — N179 Acute kidney failure, unspecified: Secondary | ICD-10-CM | POA: Diagnosis present

## 2021-06-03 DIAGNOSIS — G8929 Other chronic pain: Secondary | ICD-10-CM | POA: Diagnosis present

## 2021-06-03 DIAGNOSIS — I639 Cerebral infarction, unspecified: Secondary | ICD-10-CM | POA: Diagnosis not present

## 2021-06-03 DIAGNOSIS — F17210 Nicotine dependence, cigarettes, uncomplicated: Secondary | ICD-10-CM | POA: Diagnosis present

## 2021-06-03 DIAGNOSIS — I6329 Cerebral infarction due to unspecified occlusion or stenosis of other precerebral arteries: Secondary | ICD-10-CM | POA: Diagnosis present

## 2021-06-03 DIAGNOSIS — R296 Repeated falls: Secondary | ICD-10-CM | POA: Diagnosis present

## 2021-06-03 DIAGNOSIS — Z96642 Presence of left artificial hip joint: Secondary | ICD-10-CM | POA: Diagnosis present

## 2021-06-03 DIAGNOSIS — Q2543 Congenital aneurysm of aorta: Secondary | ICD-10-CM

## 2021-06-03 DIAGNOSIS — N182 Chronic kidney disease, stage 2 (mild): Secondary | ICD-10-CM | POA: Diagnosis present

## 2021-06-03 DIAGNOSIS — I6389 Other cerebral infarction: Secondary | ICD-10-CM | POA: Diagnosis not present

## 2021-06-03 DIAGNOSIS — R471 Dysarthria and anarthria: Secondary | ICD-10-CM | POA: Diagnosis present

## 2021-06-03 DIAGNOSIS — R4701 Aphasia: Secondary | ICD-10-CM | POA: Diagnosis present

## 2021-06-03 DIAGNOSIS — Z79899 Other long term (current) drug therapy: Secondary | ICD-10-CM | POA: Diagnosis not present

## 2021-06-03 HISTORY — DX: Heatstroke and sunstroke, initial encounter: T67.01XA

## 2021-06-03 HISTORY — DX: Cerebral infarction, unspecified: I63.9

## 2021-06-03 LAB — CBC WITH DIFFERENTIAL/PLATELET
Abs Immature Granulocytes: 0.03 10*3/uL (ref 0.00–0.07)
Basophils Absolute: 0.1 10*3/uL (ref 0.0–0.1)
Basophils Relative: 1 %
Eosinophils Absolute: 0 10*3/uL (ref 0.0–0.5)
Eosinophils Relative: 0 %
HCT: 43.4 % (ref 39.0–52.0)
Hemoglobin: 14.2 g/dL (ref 13.0–17.0)
Immature Granulocytes: 0 %
Lymphocytes Relative: 47 %
Lymphs Abs: 4.9 10*3/uL — ABNORMAL HIGH (ref 0.7–4.0)
MCH: 32.9 pg (ref 26.0–34.0)
MCHC: 32.7 g/dL (ref 30.0–36.0)
MCV: 100.5 fL — ABNORMAL HIGH (ref 80.0–100.0)
Monocytes Absolute: 0.7 10*3/uL (ref 0.1–1.0)
Monocytes Relative: 7 %
Neutro Abs: 4.6 10*3/uL (ref 1.7–7.7)
Neutrophils Relative %: 45 %
Platelets: 183 10*3/uL (ref 150–400)
RBC: 4.32 MIL/uL (ref 4.22–5.81)
RDW: 13.7 % (ref 11.5–15.5)
WBC: 10.3 10*3/uL (ref 4.0–10.5)
nRBC: 0 % (ref 0.0–0.2)

## 2021-06-03 LAB — HEPATIC FUNCTION PANEL
ALT: 22 U/L (ref 0–44)
AST: 28 U/L (ref 15–41)
Albumin: 4.1 g/dL (ref 3.5–5.0)
Alkaline Phosphatase: 65 U/L (ref 38–126)
Bilirubin, Direct: 0.1 mg/dL (ref 0.0–0.2)
Indirect Bilirubin: 0.7 mg/dL (ref 0.3–0.9)
Total Bilirubin: 0.8 mg/dL (ref 0.3–1.2)
Total Protein: 8.3 g/dL — ABNORMAL HIGH (ref 6.5–8.1)

## 2021-06-03 LAB — BASIC METABOLIC PANEL
Anion gap: 6 (ref 5–15)
BUN: 19 mg/dL (ref 8–23)
CO2: 26 mmol/L (ref 22–32)
Calcium: 9 mg/dL (ref 8.9–10.3)
Chloride: 109 mmol/L (ref 98–111)
Creatinine, Ser: 2.13 mg/dL — ABNORMAL HIGH (ref 0.61–1.24)
GFR, Estimated: 34 mL/min — ABNORMAL LOW (ref >60)
Glucose, Bld: 107 mg/dL — ABNORMAL HIGH (ref 70–99)
Potassium: 4 mmol/L (ref 3.5–5.1)
Sodium: 141 mmol/L (ref 135–145)

## 2021-06-03 LAB — RESP PANEL BY RT-PCR (FLU A&B, COVID) ARPGX2
Influenza A by PCR: NEGATIVE
Influenza B by PCR: NEGATIVE
SARS Coronavirus 2 by RT PCR: NEGATIVE

## 2021-06-03 LAB — ETHANOL: Alcohol, Ethyl (B): <10 mg/dL (ref <10)

## 2021-06-03 IMAGING — CR DG CHEST 2V
2 series · 2 of 2 positions shown · non-contrast
Comparison: Chest x-ray [DATE], CT [DATE]

CLINICAL DATA: Intermittent slurred speech with frequent falls and
confusion

EXAM:
CHEST - 2 VIEW

[w chest lat]
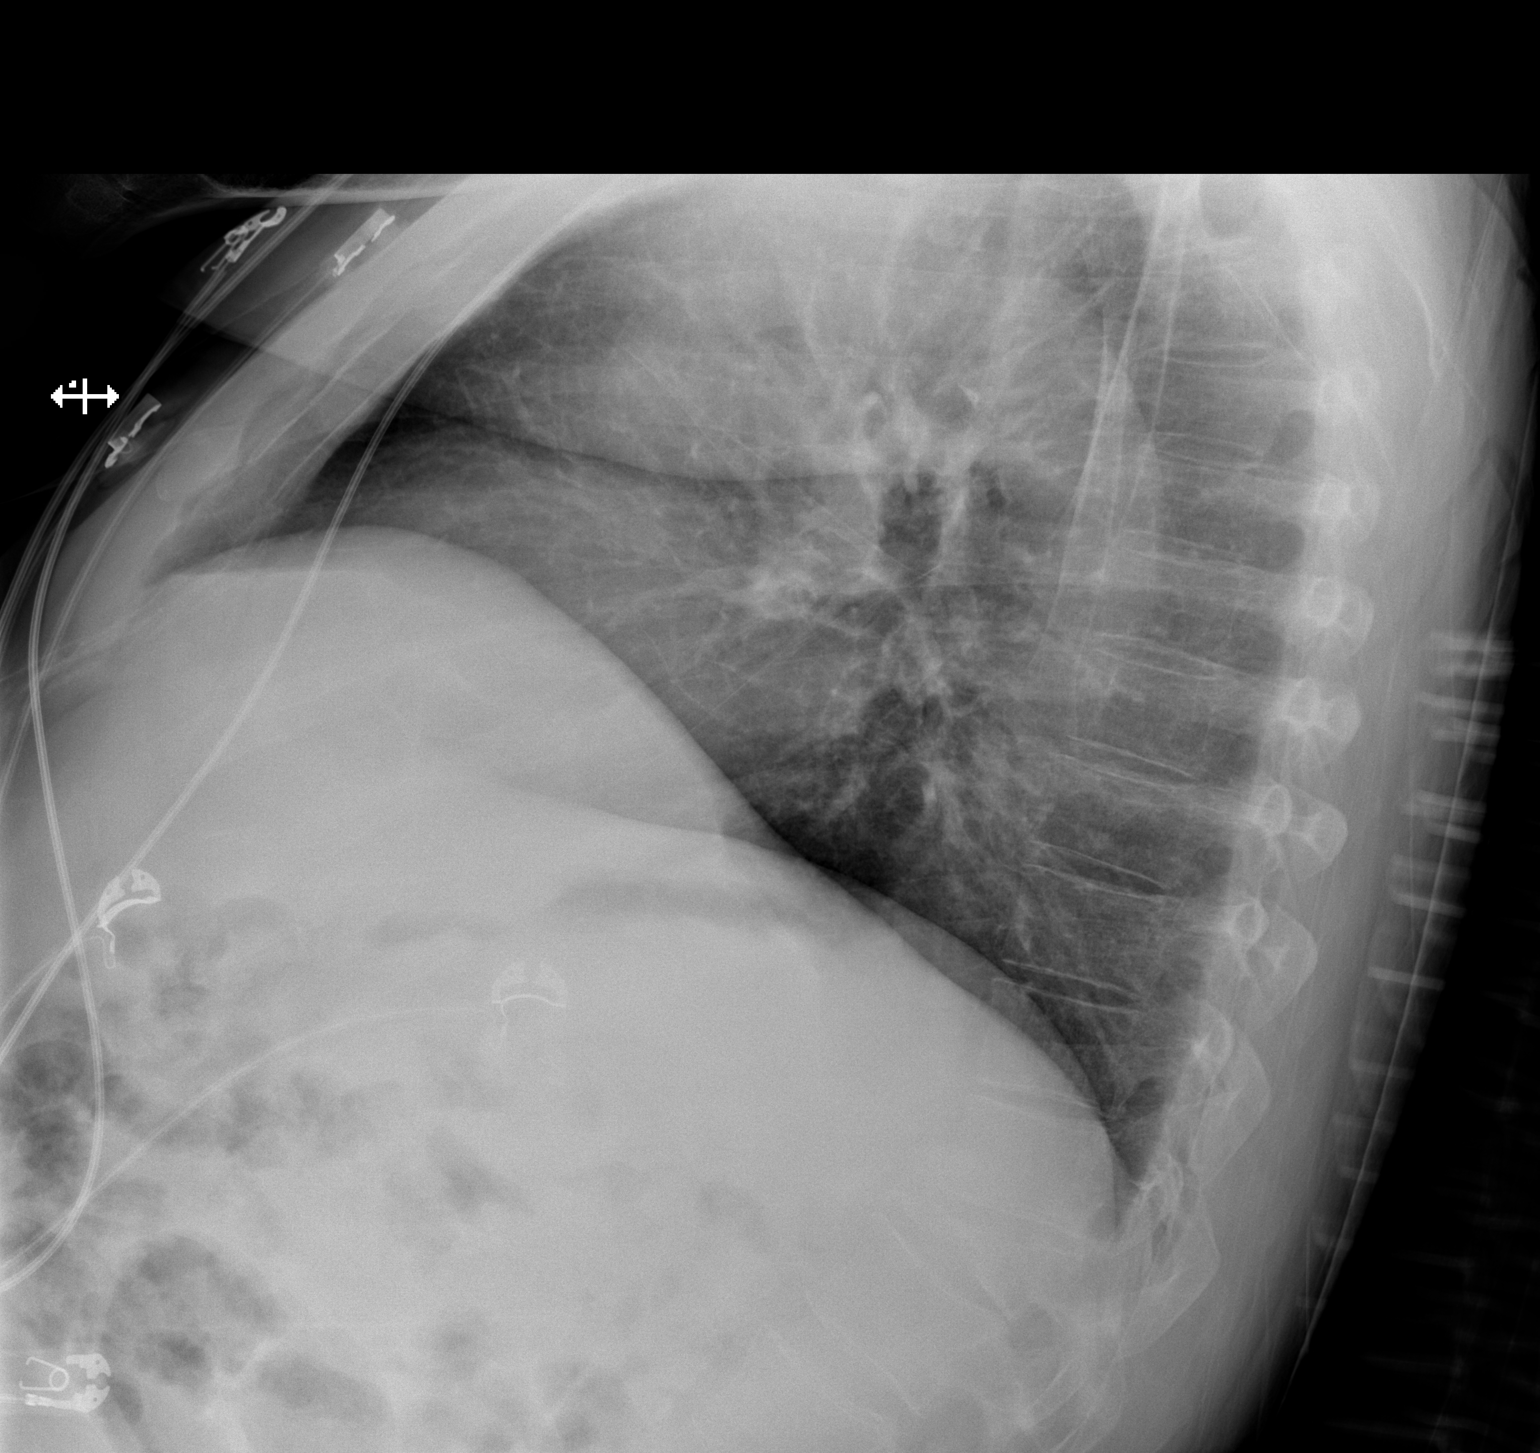

[x chest ap]
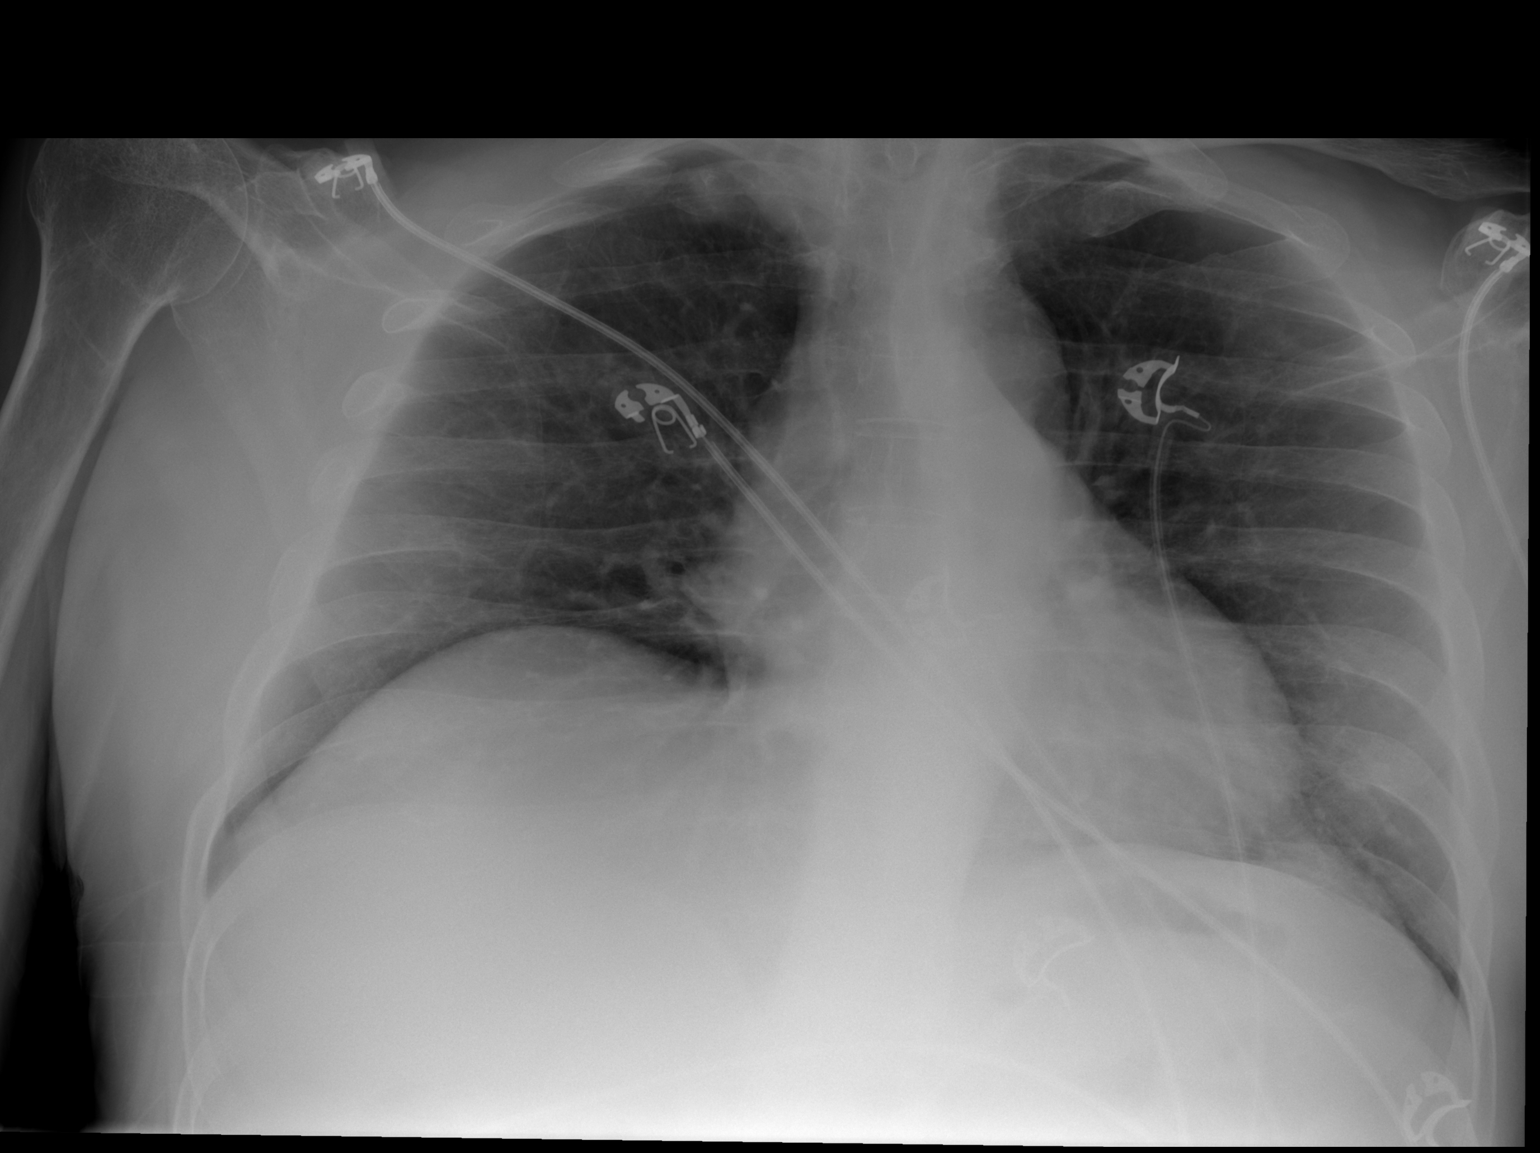

[2 of 2 positions shown; findings below may reference images not displayed]

FINDINGS: The heart size and mediastinal contours are within normal limits.
Both lungs are clear. Severe chronic compression fracture at T12.
IMPRESSION: No active cardiopulmonary disease.

## 2021-06-03 IMAGING — CT CT HEAD W/O CM
3 series · 16 of 47 positions shown, 19 images · non-contrast
Comparison: [DATE]

CLINICAL DATA: Mental status change.

EXAM:
CT HEAD WITHOUT CONTRAST
TECHNIQUE: Contiguous axial images were obtained from the base of the skull
through the vertex without intravenous contrast.

[Series 3: head wo · axial · 0.47mm/px · z∈[+1367,+1502]mm · 10 of 33 slices shown, 13 images]
[im 3/33  brain]
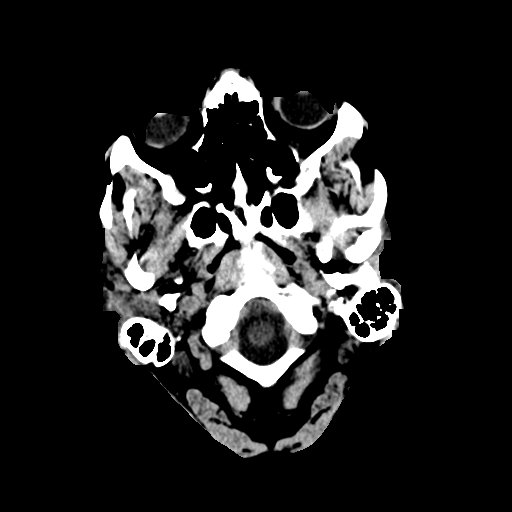
[im 3/33  bone]
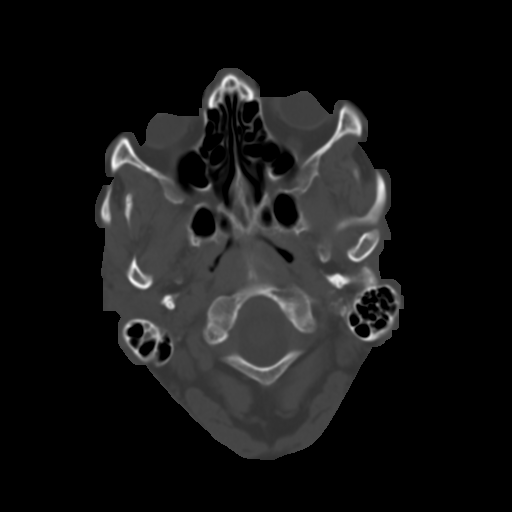
[im 6/33  brain]
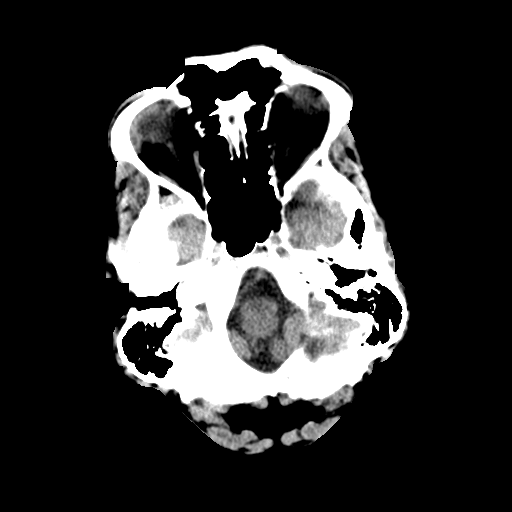
[im 9/33  brain]
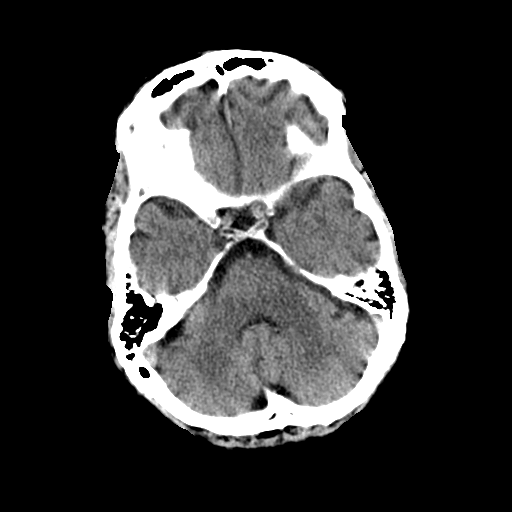
[im 12/33  brain]
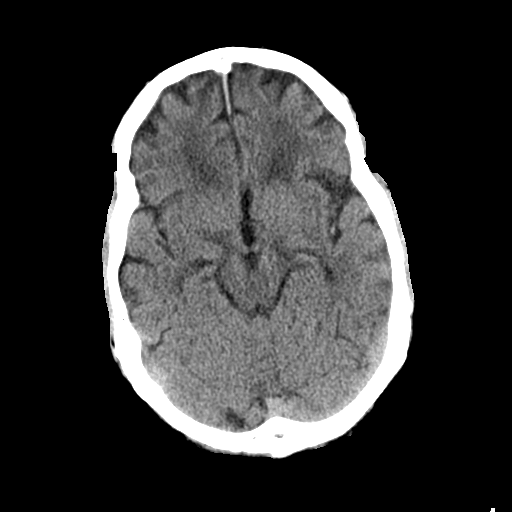
[im 15/33  brain]
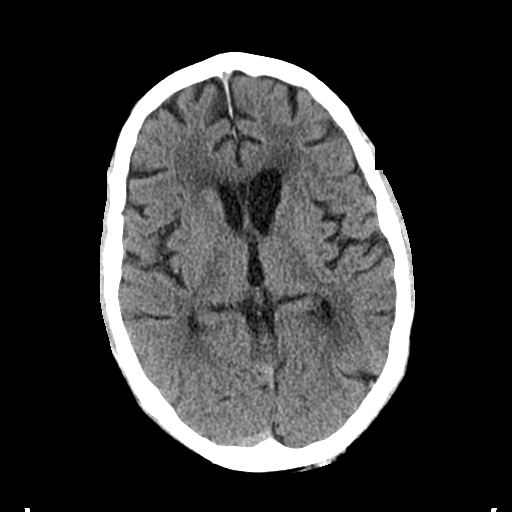
[im 15/33  bone]
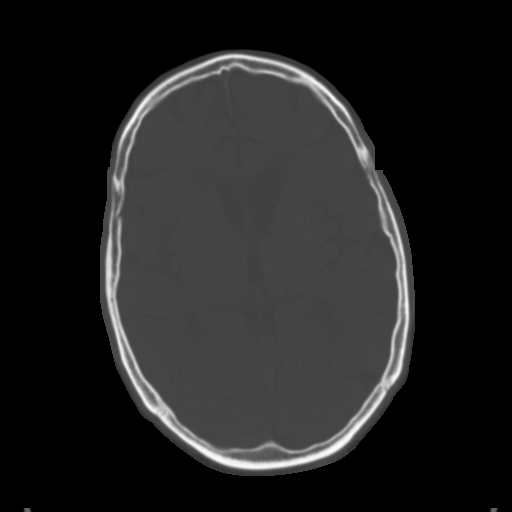
[im 18/33  brain]
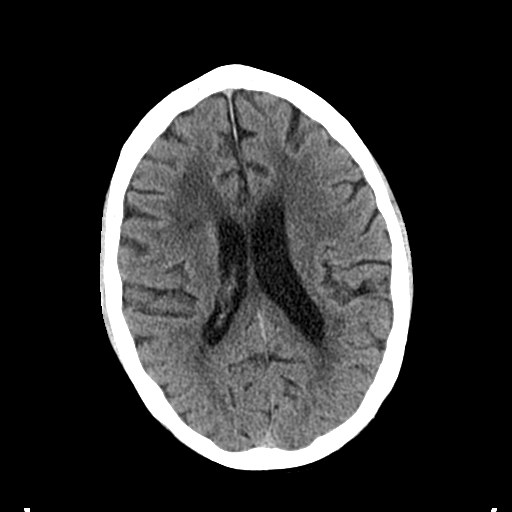
[im 21/33  brain]
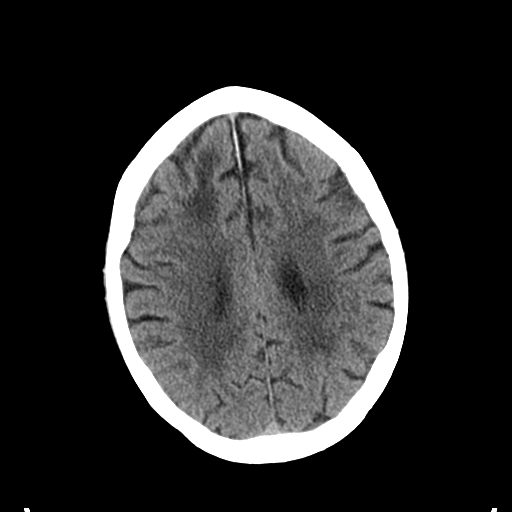
[im 25/33  brain]
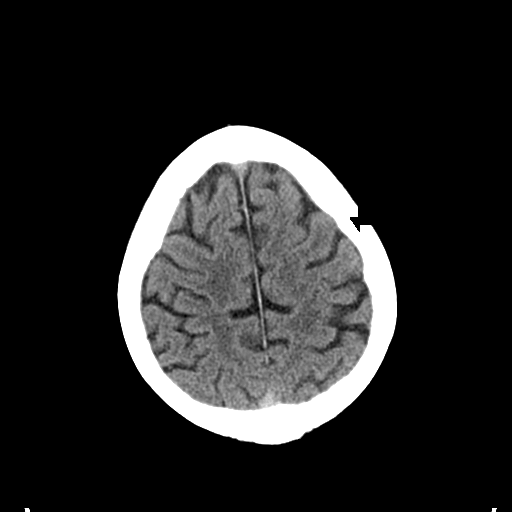
[im 27/33  brain]
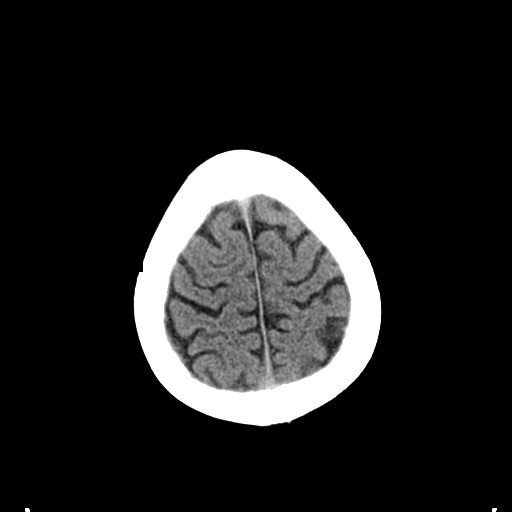
[im 27/33  bone]
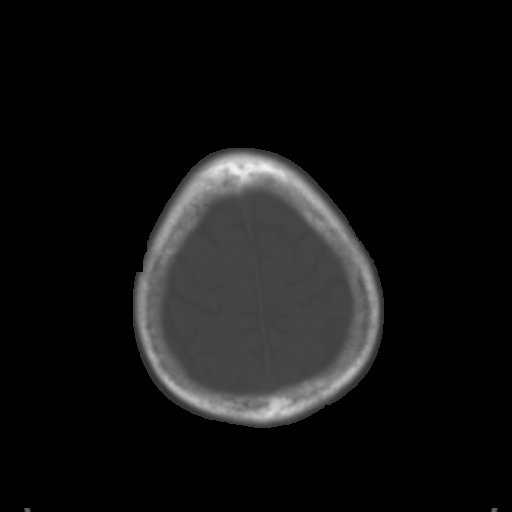
[im 30/33  brain]
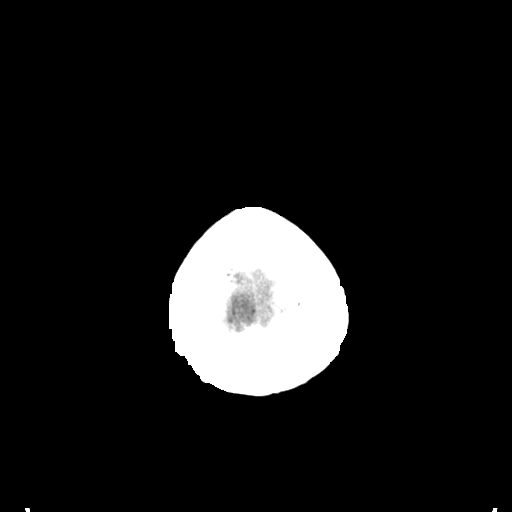

[Series 5: coronal soft tissue · coronal · 0.32mm/px · 3 of 69 slices shown]
[im 23/69  brain]
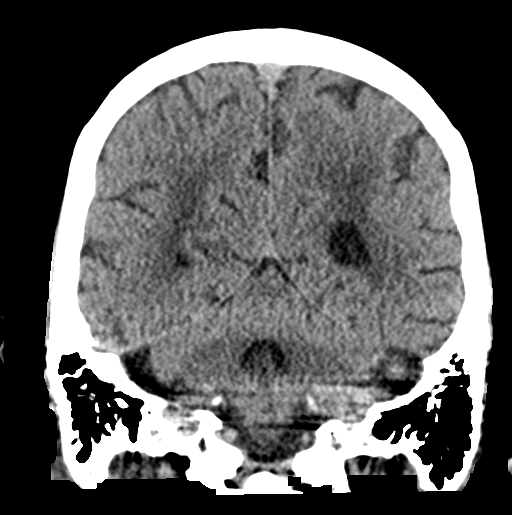
[im 31/69  brain]
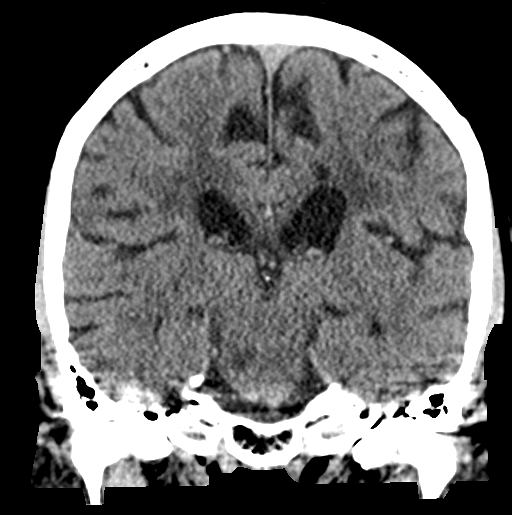
[im 38/69  brain]
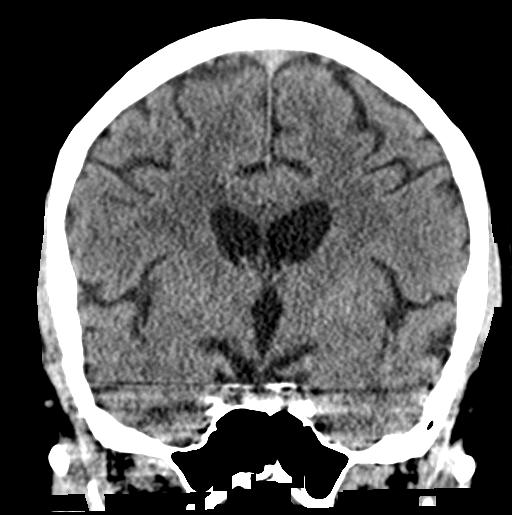

[Series 6: sagittal soft tissue · sagittal · 0.32mm/px · 3 of 55 slices shown]
[im 19/55  brain]
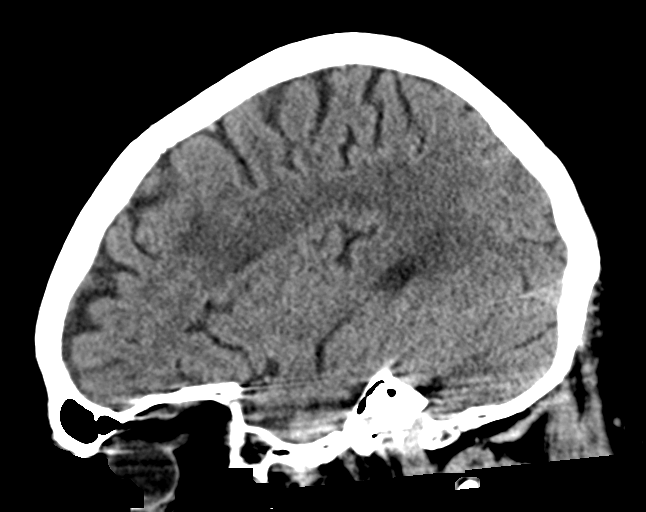
[im 28/55  brain]
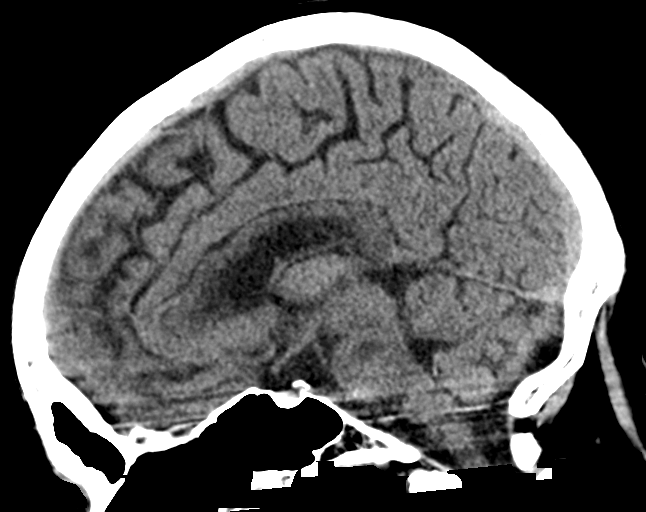
[im 37/55  brain]
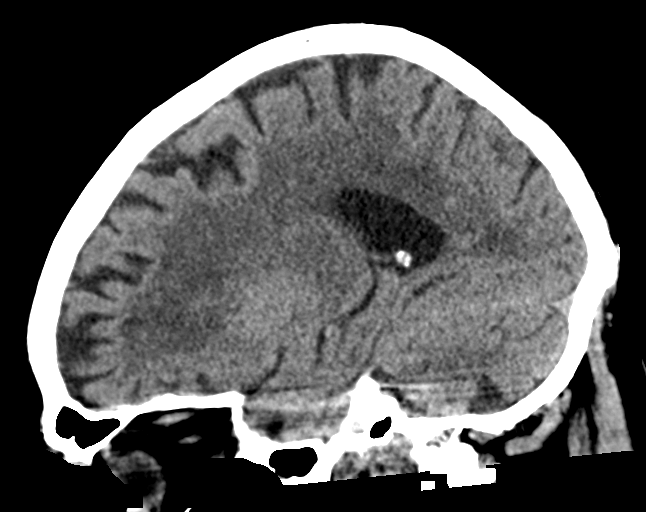

[16 of 47 positions shown; findings below may reference images not displayed]

FINDINGS: Brain: No acute intracranial hemorrhage. No focal mass lesion. No CT
evidence of acute infarction. No midline shift or mass effect. No
hydrocephalus. Basilar cisterns are patent.

Remote small RIGHT frontal cortical and subcortical infarction.

Extensive periventricular and subcortical white matter
hypodensities. Generalized cortical atrophy.

Vascular: No hyperdense vessel or unexpected calcification.

Skull: Normal. Negative for fracture or focal lesion.

Sinuses/Orbits: Paranasal sinuses and mastoid air cells are clear.
Orbits are clear.

Other: None.
IMPRESSION: 1. No acute intracranial findings.
2. Atrophy and white matter microvascular disease similar to prior.
3. Remote RIGHT frontal infarction.

## 2021-06-03 IMAGING — MR MR MRA NECK WO/W CM
4 of 7 series · 25 of 48 positions shown · IV contrast (gadavist)
Comparison: Previous MRI from earlier the same day.

CLINICAL DATA: Initial evaluation for acute stroke.

EXAM:
MRA NECK WITHOUT AND WITH CONTRAST
MRA HEAD WITHOUT CONTRAST
TECHNIQUE: Multiplanar and multiecho pulse sequences of the neck were obtained
without and with intravenous contrast. Angiographic images of the
neck were obtained using MRA technique without and with intravenous
contrast; Angiographic images of the Circle of Willis were obtained
using MRA technique without intravenous contrast.
CONTRAST:  9 cc of Gadavist.

[Series 3: tof_2d_tra · axial · 3.5mm · 0.43mm/px · z∈[-70,+75]mm · 6 of 60 slices shown]
[im 1/60]
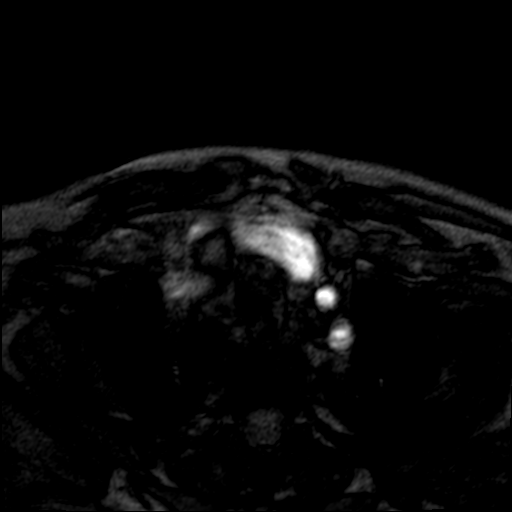
[im 12/60]
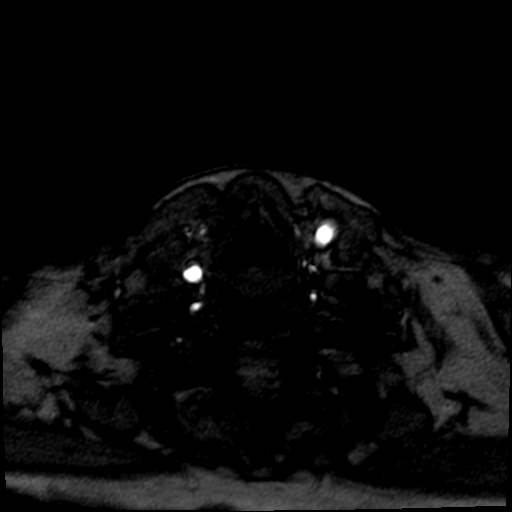
[im 24/60]
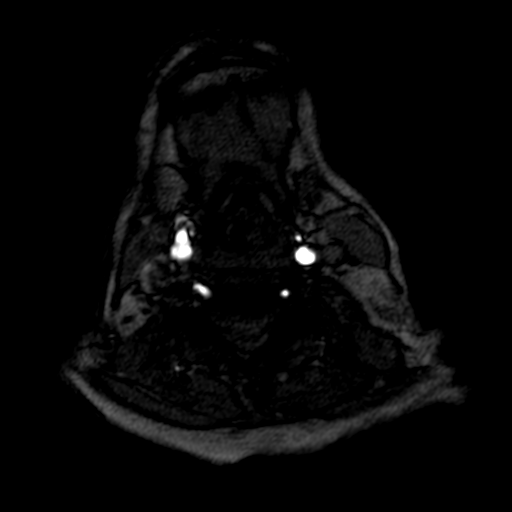
[im 36/60]
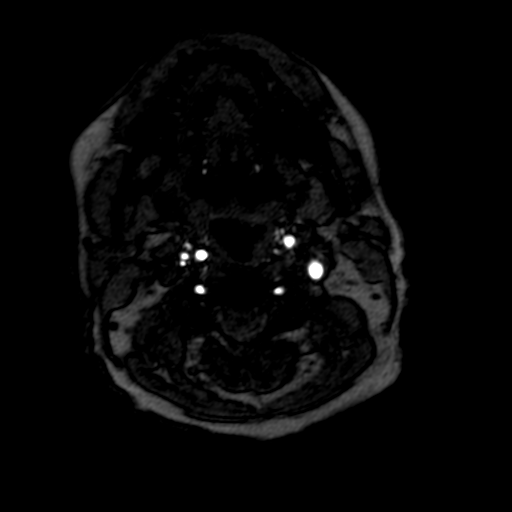
[im 48/60]
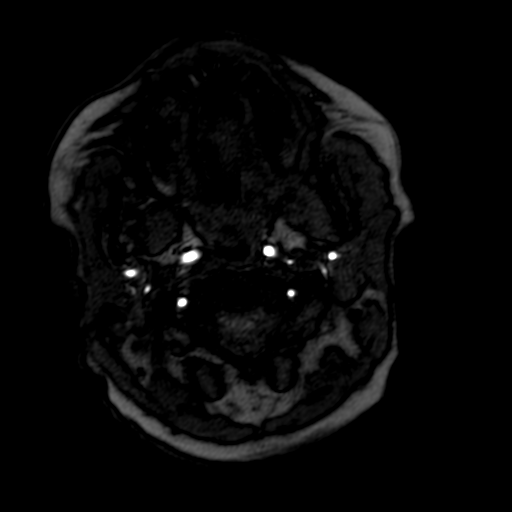
[im 60/60]
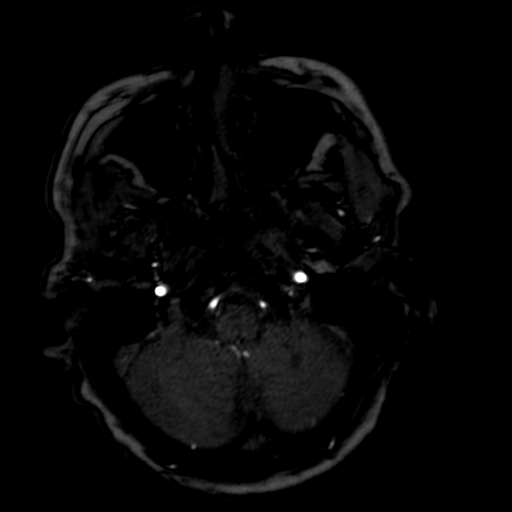

[Series 7: T1 fat-sat · axial · 3.0mm · 0.43mm/px · z∈[-0,+104]mm · 4 of 30 slices shown (1 of 2)]
[im 1/30]
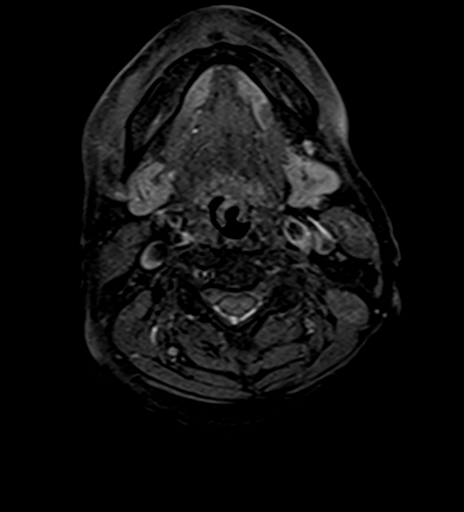
[im 10/30]
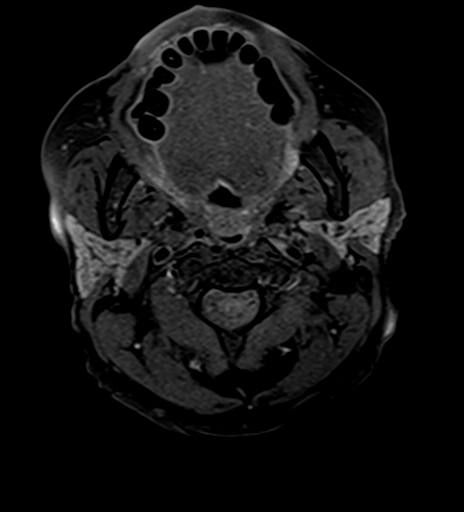
[im 20/30]
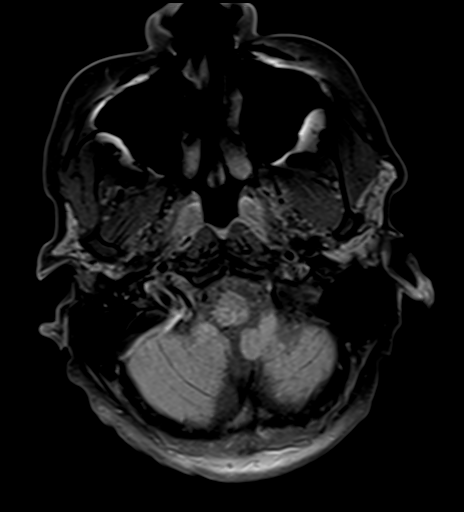
[im 30/30]
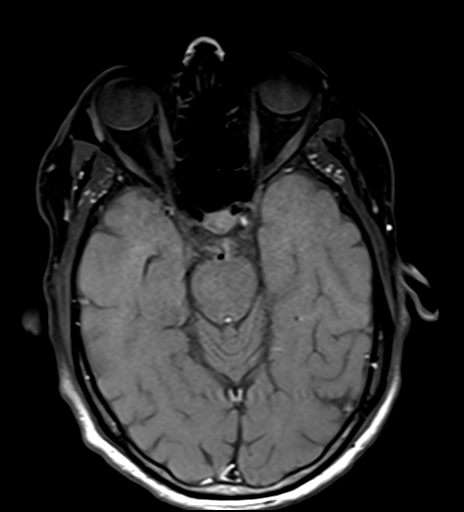

[Series 9: T1 fat-sat · axial · 3.0mm · 0.43mm/px · z∈[-108,-4]mm · 4 of 30 slices shown (2 of 2)]
[im 1/30]
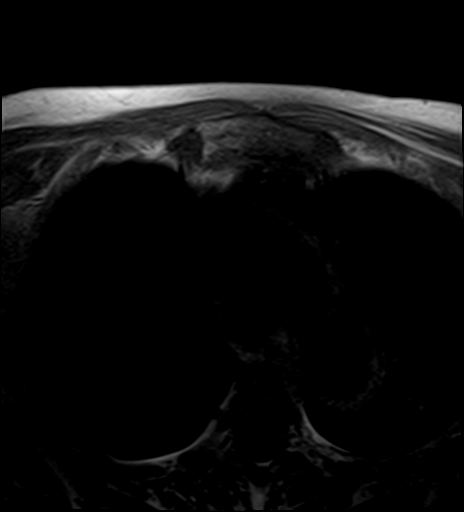
[im 10/30]
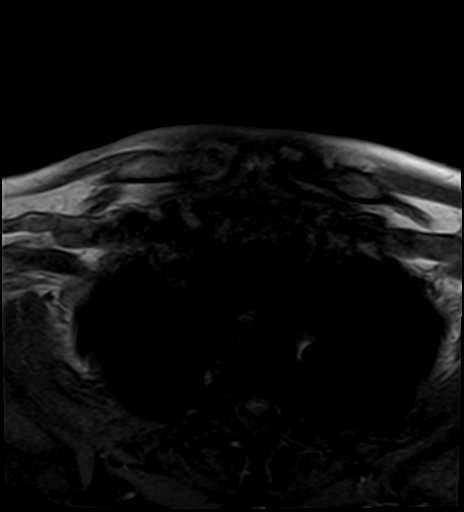
[im 20/30]
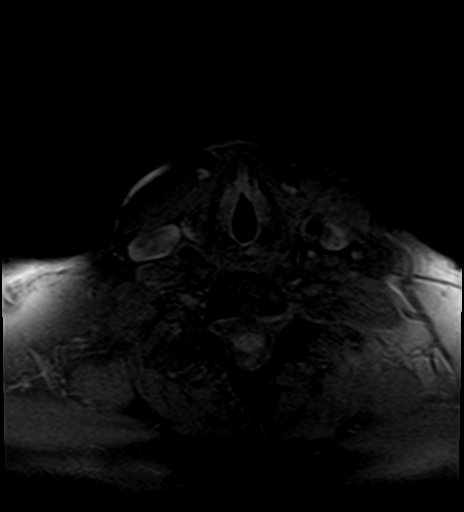
[im 30/30]
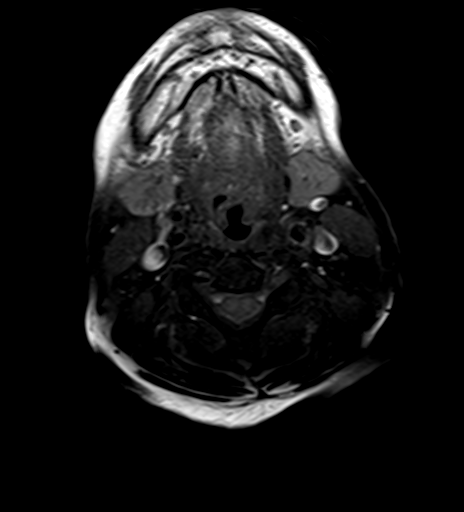

[Series 10: (id)_cor_pre · coronal · 0.8mm · 0.78mm/px · 11 of 96 slices shown]
[im 1/96]
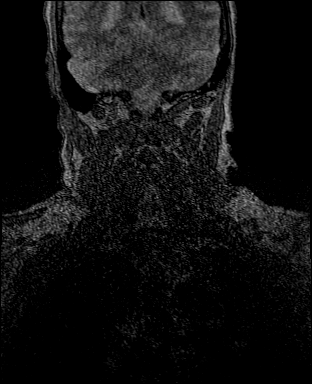
[im 10/96]
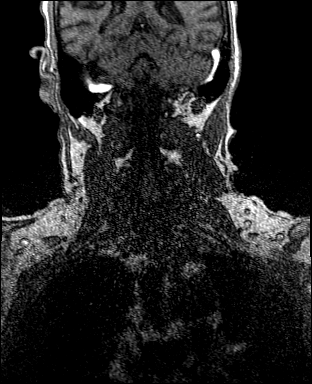
[im 20/96]
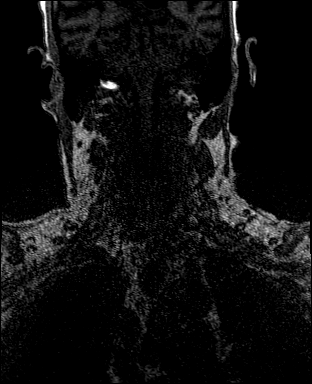
[im 29/96]
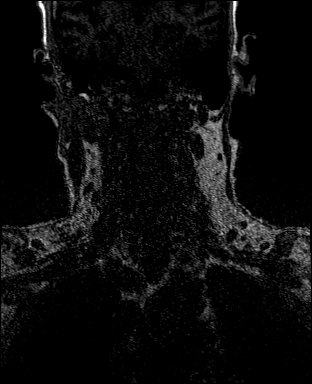
[im 39/96]
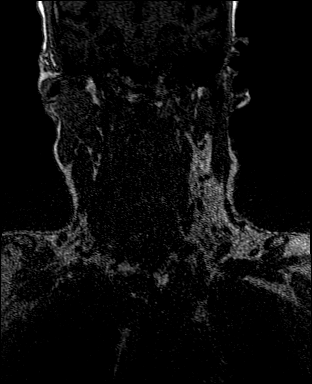
[im 48/96]
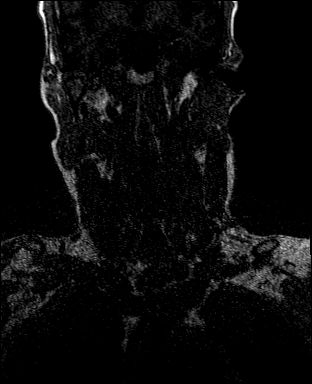
[im 58/96]
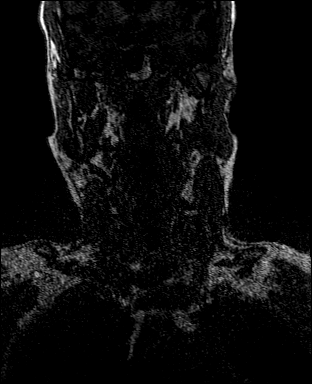
[im 67/96]
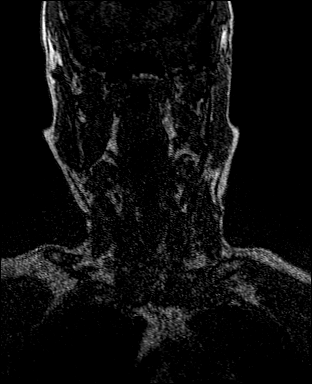
[im 77/96]
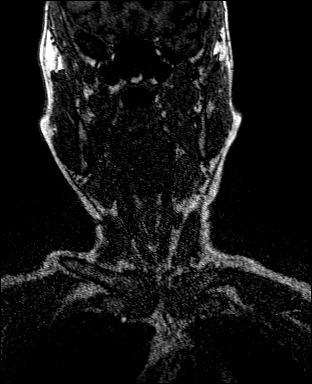
[im 86/96]
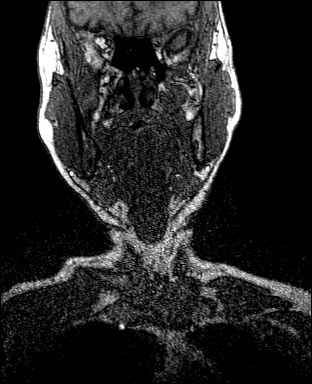
[im 96/96]
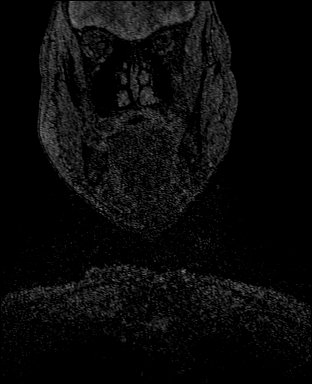

[25 of 48 positions shown; findings below may reference images not displayed]

FINDINGS: MRA NECK FINDINGS

AORTIC ARCH: Partially visualized aortic arch normal in caliber with
normal branch pattern. No stenosis seen about the origin of the
great vessels.

RIGHT CAROTID SYSTEM: Right common and internal carotid arteries
patent without stenosis or dissection. No significant atheromatous
narrowing about the right carotid bulb.

LEFT CAROTID SYSTEM: Left common and internal carotid arteries
patent without stenosis or dissection. No significant atheromatous
narrowing about the left carotid wall.

VERTEBRAL ARTERIES: Both vertebral arteries arise from the
subclavian arteries. Right vertebral artery slightly dominant. No
visible proximal subclavian artery stenosis. Vertebral arteries
patent without stenosis or evidence for dissection.

MRA HEAD FINDINGS

ANTERIOR CIRCULATION:

Both internal carotid arteries widely patent to the termini without
stenosis or other abnormality. A1 segments widely patent. Normal
anterior communicating artery complex. Anterior cerebral arteries
widely patent without stenosis. No M1 stenosis or occlusion. Normal
MCA bifurcations. Distal MCA branches well perfused and symmetric.

POSTERIOR CIRCULATION:

Visualized V4 segments widely patent bilaterally. Right vertebral
artery dominant. Partially visualized PICA patent bilaterally.
Basilar patent to its distal aspect without stenosis. Superior
cerebellar are and posterior cerebral arteries widely patent
bilaterally. Small bilateral posterior communicating arteries noted.

Anatomic variants: None significant.  No aneurysm.
IMPRESSION: Negative MRA of the head and neck. No large vessel occlusion. No
hemodynamically significant or correctable stenosis.

## 2021-06-03 IMAGING — MR MR MRA HEAD W/O CM
1 series · 19 of 48 positions shown · IV contrast (gadavist)
Comparison: Previous MRI from earlier the same day.

CLINICAL DATA: Initial evaluation for acute stroke.

EXAM:
MRA NECK WITHOUT AND WITH CONTRAST
MRA HEAD WITHOUT CONTRAST
TECHNIQUE: Multiplanar and multiecho pulse sequences of the neck were obtained
without and with intravenous contrast. Angiographic images of the
neck were obtained using MRA technique without and with intravenous
contrast; Angiographic images of the Circle of Willis were obtained
using MRA technique without intravenous contrast.
CONTRAST:  9 cc of Gadavist.

[Series 8: TOF · axial · 0.6mm · 0.35mm/px · z∈[-45,+53]mm · 19 of 172 slices shown]
[im 1/172]
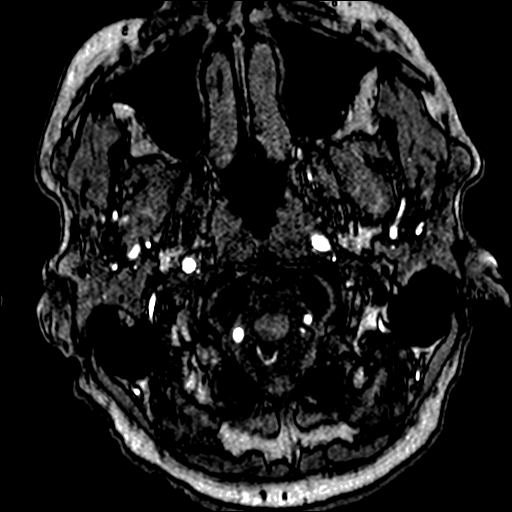
[im 4/172]
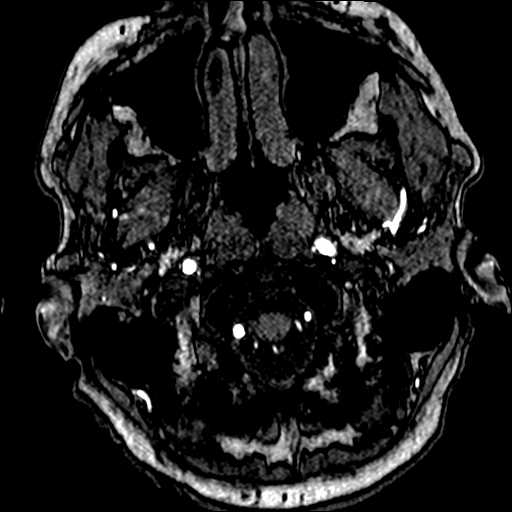
[im 8/172]
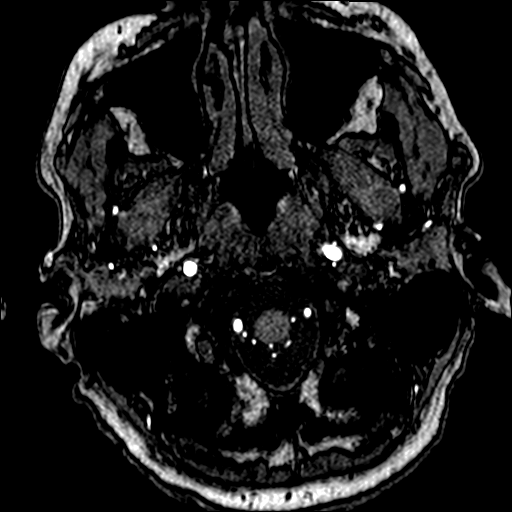
[im 11/172]
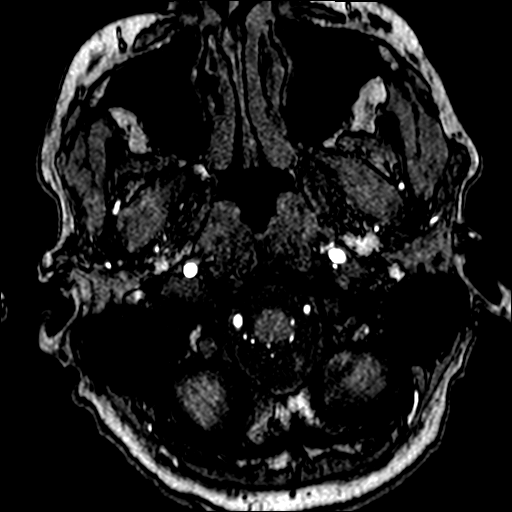
[im 15/172]
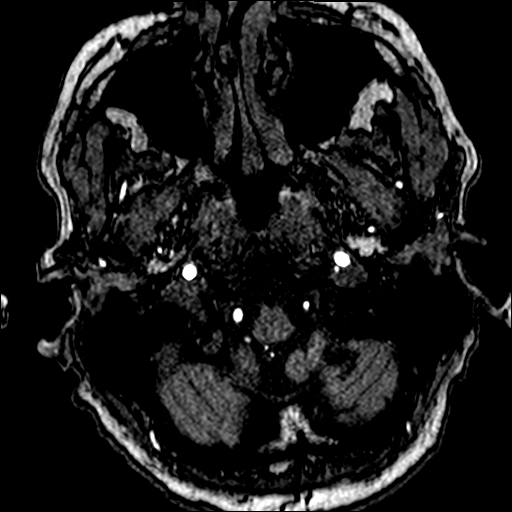
[im 19/172]
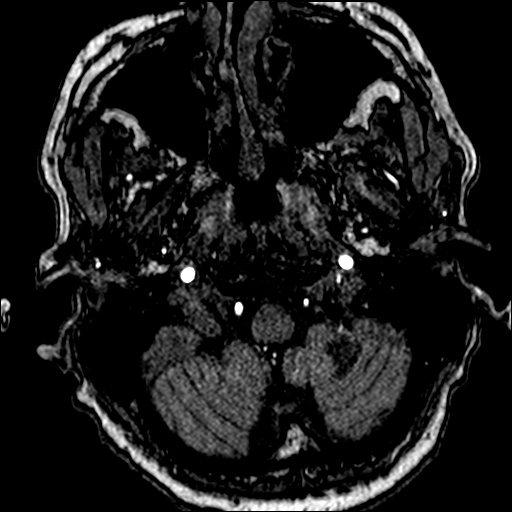
[im 22/172]
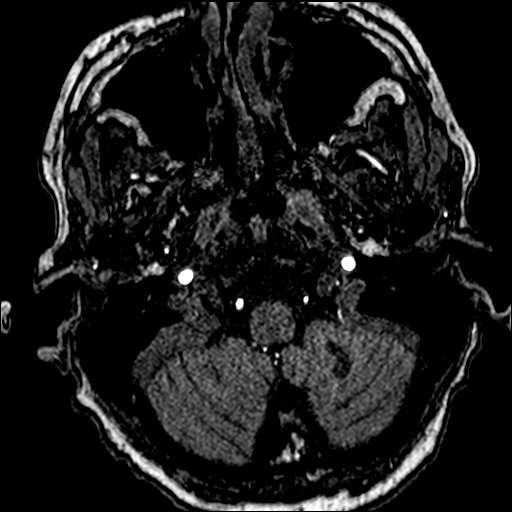
[im 26/172]
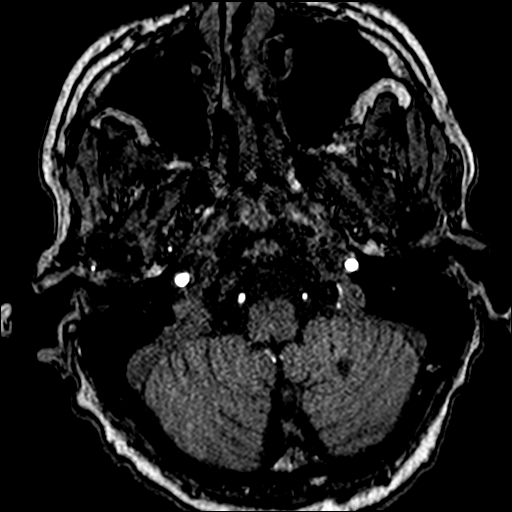
[im 30/172]
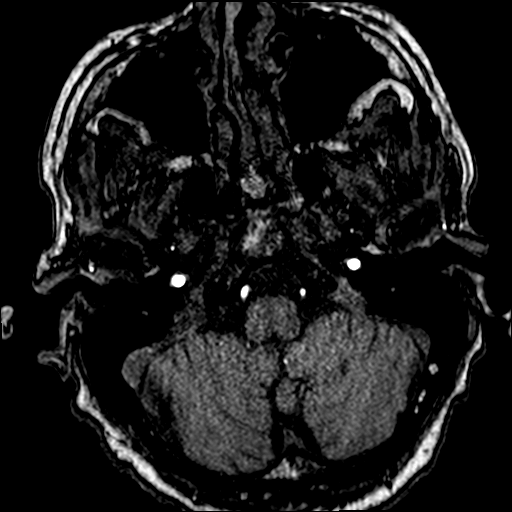
[im 33/172]
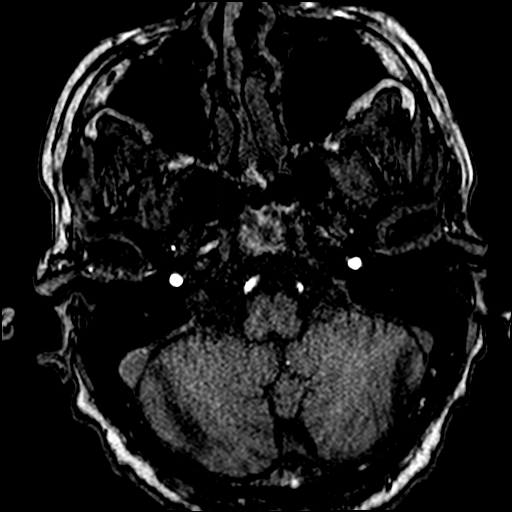
[im 37/172]
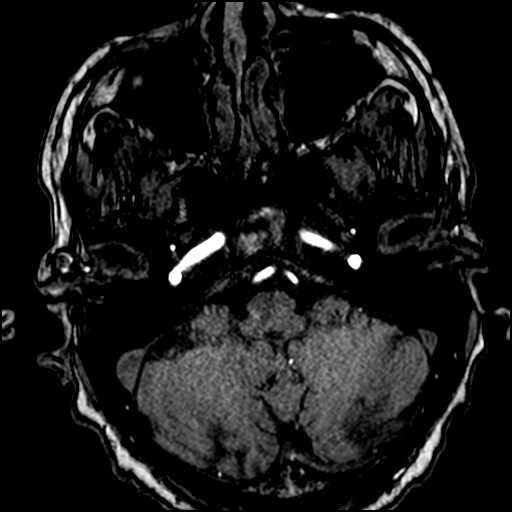
[im 55/172]
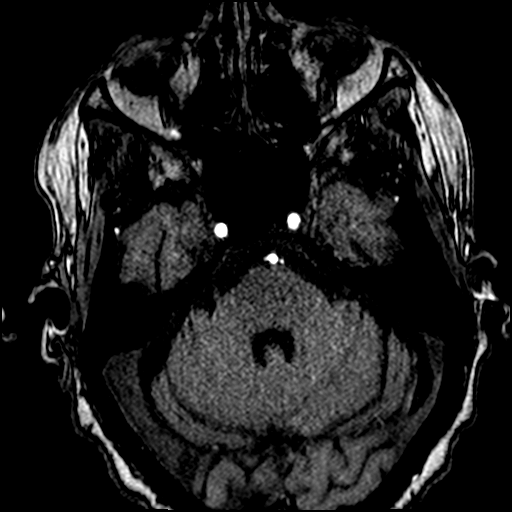
[im 77/172]
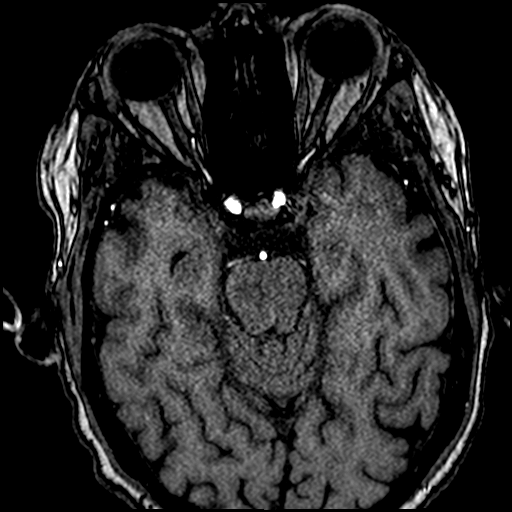
[im 88/172]
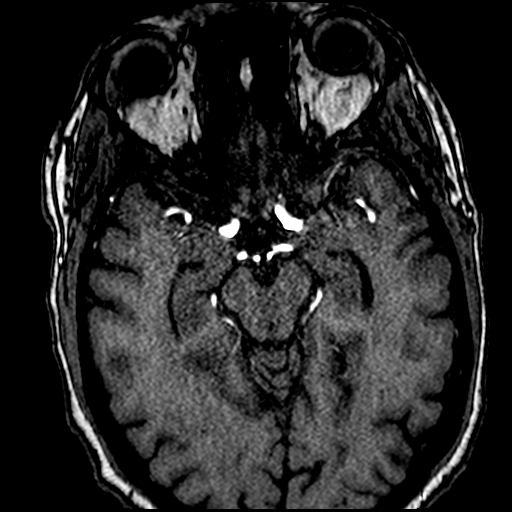
[im 99/172]
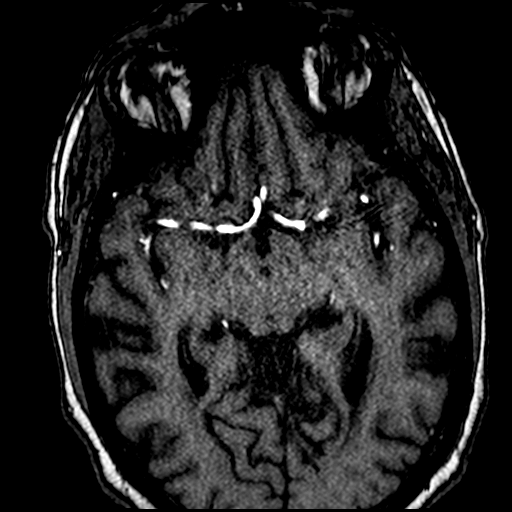
[im 121/172]
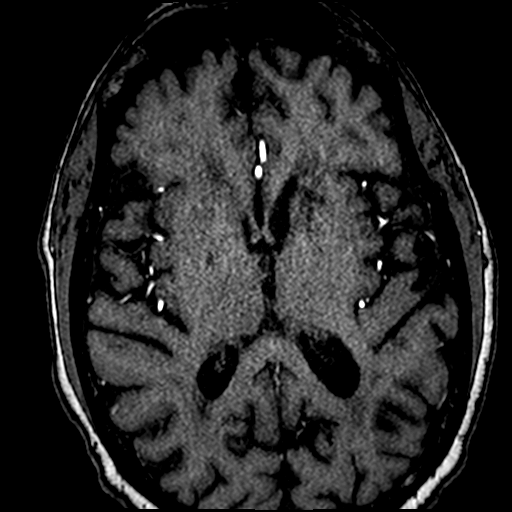
[im 142/172]
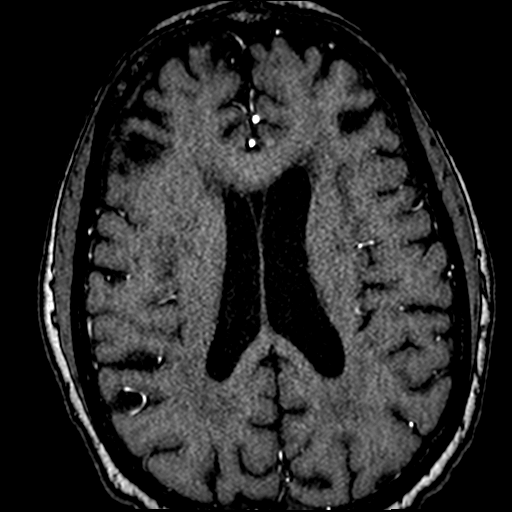
[im 146/172]
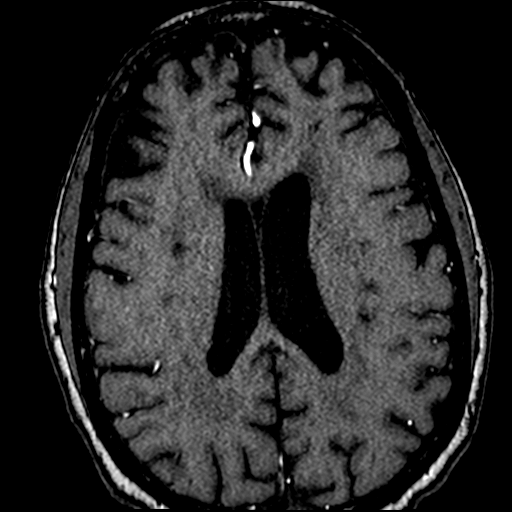
[im 164/172]
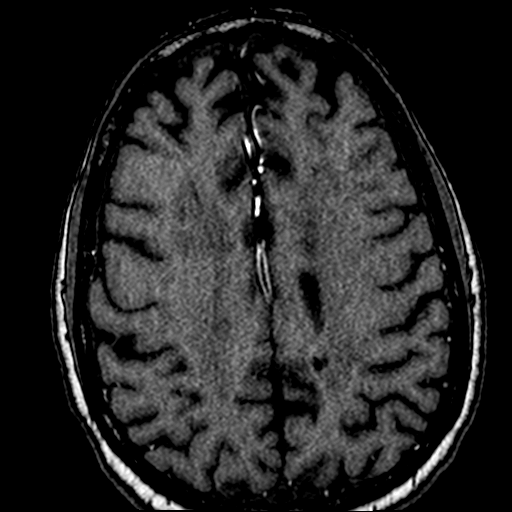

[19 of 48 positions shown; findings below may reference images not displayed]

FINDINGS: MRA NECK FINDINGS

AORTIC ARCH: Partially visualized aortic arch normal in caliber with
normal branch pattern. No stenosis seen about the origin of the
great vessels.

RIGHT CAROTID SYSTEM: Right common and internal carotid arteries
patent without stenosis or dissection. No significant atheromatous
narrowing about the right carotid bulb.

LEFT CAROTID SYSTEM: Left common and internal carotid arteries
patent without stenosis or dissection. No significant atheromatous
narrowing about the left carotid wall.

VERTEBRAL ARTERIES: Both vertebral arteries arise from the
subclavian arteries. Right vertebral artery slightly dominant. No
visible proximal subclavian artery stenosis. Vertebral arteries
patent without stenosis or evidence for dissection.

MRA HEAD FINDINGS

ANTERIOR CIRCULATION:

Both internal carotid arteries widely patent to the termini without
stenosis or other abnormality. A1 segments widely patent. Normal
anterior communicating artery complex. Anterior cerebral arteries
widely patent without stenosis. No M1 stenosis or occlusion. Normal
MCA bifurcations. Distal MCA branches well perfused and symmetric.

POSTERIOR CIRCULATION:

Visualized V4 segments widely patent bilaterally. Right vertebral
artery dominant. Partially visualized PICA patent bilaterally.
Basilar patent to its distal aspect without stenosis. Superior
cerebellar are and posterior cerebral arteries widely patent
bilaterally. Small bilateral posterior communicating arteries noted.

Anatomic variants: None significant.  No aneurysm.
IMPRESSION: Negative MRA of the head and neck. No large vessel occlusion. No
hemodynamically significant or correctable stenosis.

## 2021-06-03 IMAGING — MR MR HEAD W/O CM
10 series · 48 of 48 positions shown · non-contrast
Comparison: Head CT from earlier the same day.

CLINICAL DATA: Initial evaluation for neuro deficit, stroke
suspected./

EXAM:
MRI HEAD WITHOUT CONTRAST
TECHNIQUE: Multiplanar, multiecho pulse sequences of the brain and surrounding
structures were obtained without intravenous contrast.

[Series 5: DWI · axial · 3.0mm · 1.36mm/px · z∈[-57,+83]mm · 9 of 96 slices shown (1 of 2)]
[im 1/96]
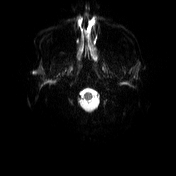
[im 12/96]
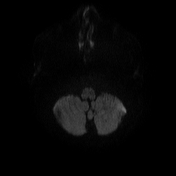
[im 24/96]
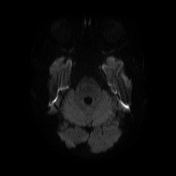
[im 36/96]
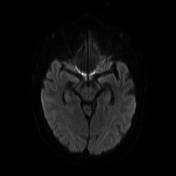
[im 48/96]
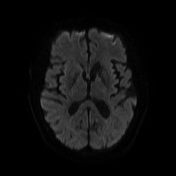
[im 60/96]
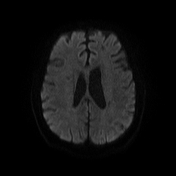
[im 72/96]
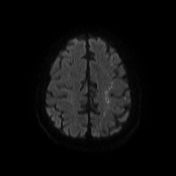
[im 84/96]
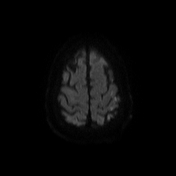
[im 96/96]
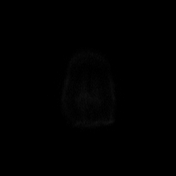

[Series 6: DWI · axial · 3.0mm · 1.36mm/px · z∈[-57,+83]mm · 5 of 48 slices shown (2 of 2)]
[im 1/48]
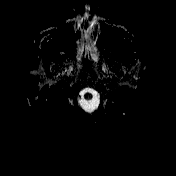
[im 12/48]
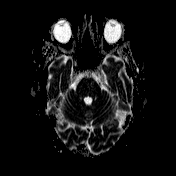
[im 24/48]
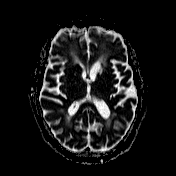
[im 36/48]
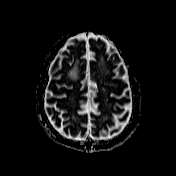
[im 48/48]
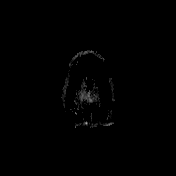

[Series 7: T1 · sagittal · 5.0mm · 0.75mm/px · 2 of 24 slices shown (1 of 2)]
[im 1/24]
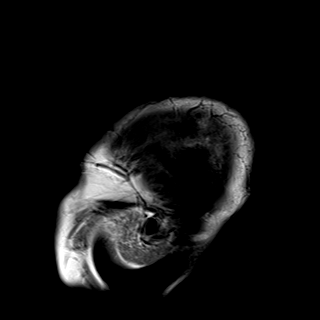
[im 24/24]
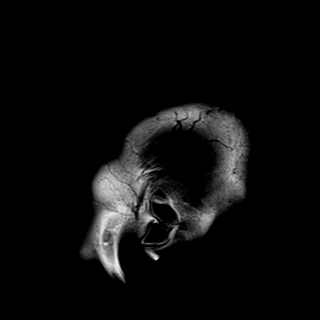

[Series 8: T2 · axial · 5.0mm · 0.62mm/px · z∈[-67,+95]mm · 2 of 24 slices shown (1 of 2)]
[im 1/24]
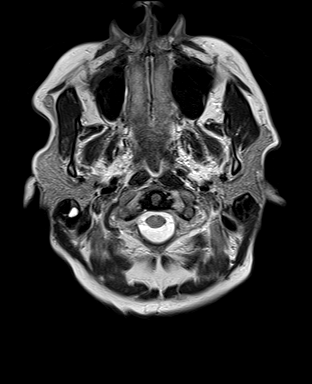
[im 24/24]
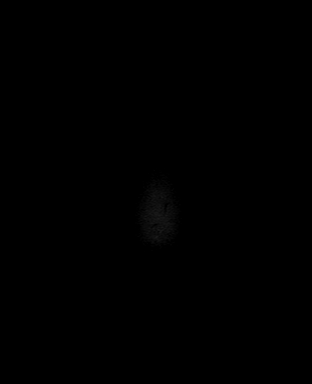

[Series 9: swi_images · axial · 3.0mm · 0.75mm/px · z∈[-74,+102]mm · 5 of 60 slices shown]
[im 1/60]
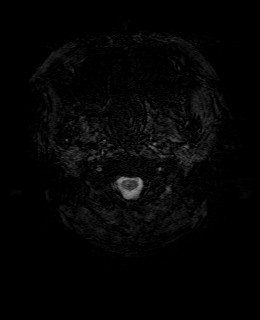
[im 15/60]
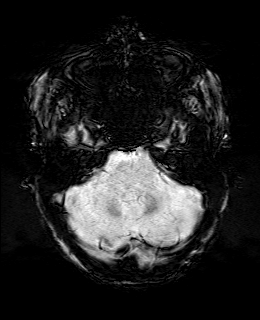
[im 30/60]
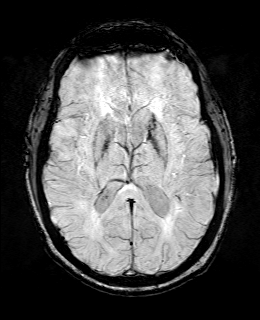
[im 45/60]
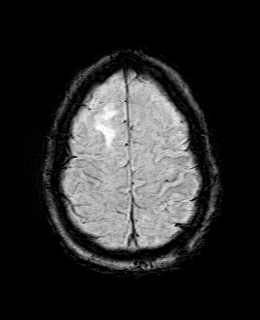
[im 60/60]
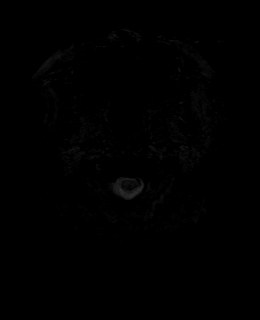

[Series 11: FLAIR · axial · 3.0mm · 0.75mm/px · z∈[-62,+90]mm · 4 of 52 slices shown]
[im 1/52]
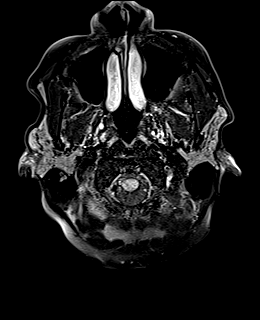
[im 18/52]
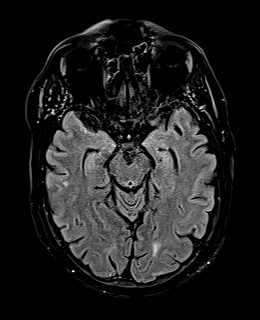
[im 35/52]
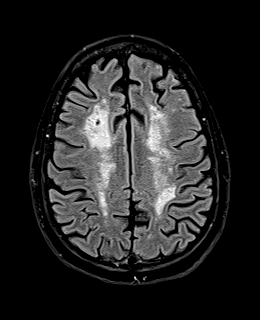
[im 52/52]
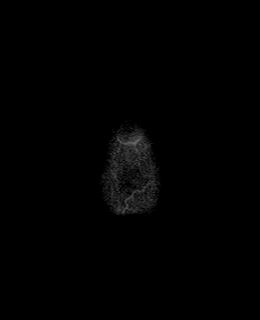

[Series 12: T1 · axial · 1.0mm · 0.94mm/px · z∈[-57,+85]mm · 12 of 144 slices shown (2 of 2)]
[im 1/144]
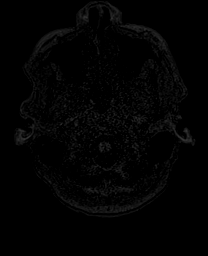
[im 14/144]
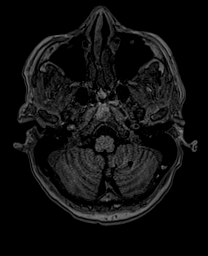
[im 27/144]
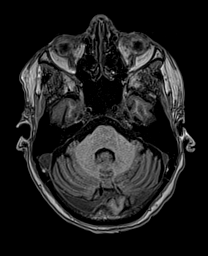
[im 40/144]
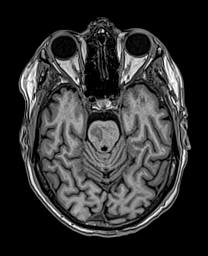
[im 53/144]
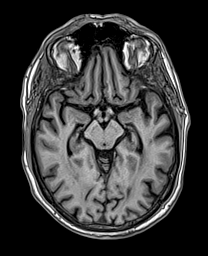
[im 66/144]
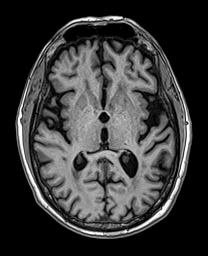
[im 79/144]
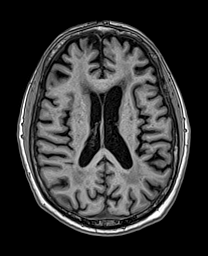
[im 92/144]
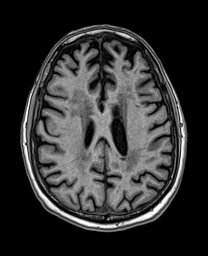
[im 105/144]
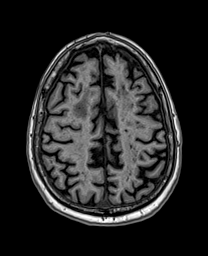
[im 118/144]
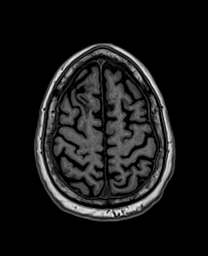
[im 131/144]
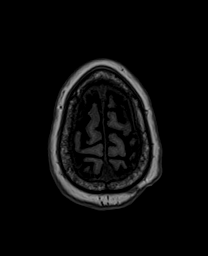
[im 144/144]
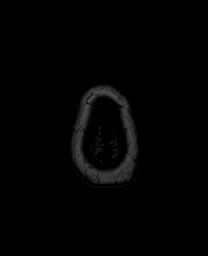

[Series 13: cor dwi_tracew · coronal · 5.0mm · 1.53mm/px · 5 of 56 slices shown]
[im 1/56]
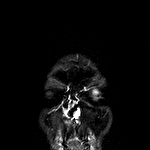
[im 14/56]
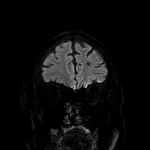
[im 28/56]
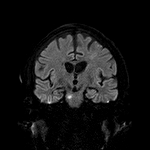
[im 42/56]
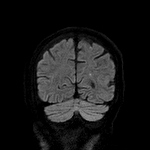
[im 56/56]
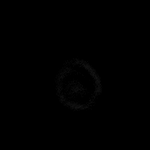

[Series 14: cor dwi_adc · coronal · 5.0mm · 1.53mm/px · 2 of 27 slices shown]
[im 1/27]
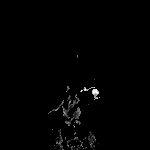
[im 27/27]
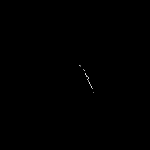

[Series 15: T2 · coronal · 5.0mm · 0.57mm/px · 2 of 28 slices shown (2 of 2)]
[im 1/28]
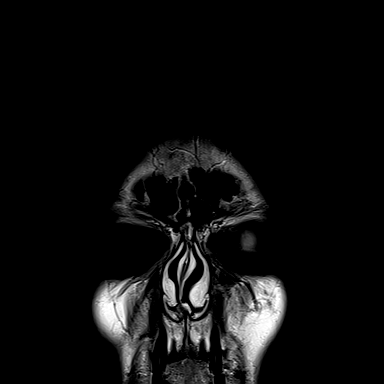
[im 28/28]
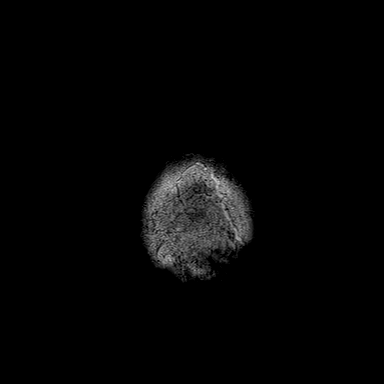

[48 of 48 positions shown; findings below may reference images not displayed]

FINDINGS: Brain: Cerebral volume within normal limits for age. Patchy and
confluent T2/FLAIR hyperintensity involving the periventricular deep
white matter both cerebral hemispheres most consistent with chronic
small vessel ischemic disease, moderate to advanced in nature.
Multiple scattered remote lacunar infarcts present about the
bilateral basal ganglia/hemispheric cerebral white matter. Chronic
right MCA territory infarct noted at the high right frontal lobe.
Additional chronic left cerebellar infarcts noted. Additional small
areas of encephalomalacia and gliosis involving the anterior frontal
poles favored to be related to remote trauma.

12 mm acute ischemic infarcts seen involving the right pons (series
5, image 62). Additional scattered acute ischemic infarcts seen
involving the cortical and subcortical aspect of the left frontal,
parietal, and occipital regions, consistent with acute ischemic
infarcts as well (series 5, image 84). These are watershed in
distribution. No associated hemorrhage or significant mass effect
about these areas of infarction.

Otherwise, gray-white matter differentiation maintained. No areas of
acute or chronic intracranial hemorrhage.

No mass lesion, midline shift or mass effect. No hydrocephalus or
extra-axial fluid collection. Pituitary gland suprasellar region
normal. Midline structures intact.

Vascular: Major intracranial vascular flow voids are maintained.

Skull and upper cervical spine: Craniocervical junction within
normal limits. Bone marrow signal intensity normal. No scalp soft
tissue abnormality.

Sinuses/Orbits: Globes and orbital soft tissues demonstrate no acute
finding. Mild scattered mucosal thickening noted within the
ethmoidal air cells. Paranasal sinuses are otherwise clear. Trace
right mastoid effusion noted, of doubtful significance.

Other: None.
IMPRESSION: 1. 12 mm acute ischemic nonhemorrhagic right pontine infarct.
2. Additional scattered acute ischemic nonhemorrhagic infarcts
involving the cortical and subcortical aspect of the left frontal,
parietal, and occipital lobes. These are watershed in distribution.
3. Underlying moderate to advanced chronic microvascular ischemic
disease with multiple remote infarcts involving the right frontal
lobe, bilateral basal ganglia/hemispheric cerebral white matter,
left cerebellum.
4. Additional small areas of encephalomalacia involving the anterior
frontal lobes bilaterally, likely related to remote trauma.

## 2021-06-03 MED ORDER — ATORVASTATIN CALCIUM 40 MG PO TABS
80.0000 mg | ORAL_TABLET | Freq: Every day | ORAL | Status: DC
Start: 2021-06-04 — End: 2021-06-03

## 2021-06-03 MED ORDER — NICOTINE 14 MG/24HR TD PT24
14.0000 mg | MEDICATED_PATCH | Freq: Every day | TRANSDERMAL | Status: DC
  Administered 2021-06-04 – 2021-06-14 (×11): 14 mg via TRANSDERMAL
  Filled 2021-06-03 (×11): qty 1

## 2021-06-03 MED ORDER — ATORVASTATIN CALCIUM 40 MG PO TABS
80.0000 mg | ORAL_TABLET | Freq: Every day | ORAL | Status: DC
  Administered 2021-06-04: 80 mg via ORAL
  Filled 2021-06-03 (×2): qty 2

## 2021-06-03 MED ORDER — ACETAMINOPHEN 325 MG PO TABS: 650.0000 mg | ORAL_TABLET | Freq: Four times a day (QID) | ORAL | Status: DC | PRN

## 2021-06-03 MED ORDER — SODIUM CHLORIDE 0.9 % IV SOLN: INTRAVENOUS | Status: DC

## 2021-06-03 MED ORDER — SODIUM CHLORIDE 0.9 % IV BOLUS
500.0000 mL | Freq: Once | INTRAVENOUS | Status: AC
  Administered 2021-06-03: 500 mL via INTRAVENOUS

## 2021-06-03 MED ORDER — ENOXAPARIN SODIUM 40 MG/0.4ML IJ SOSY
40.0000 mg | PREFILLED_SYRINGE | INTRAMUSCULAR | Status: DC
  Administered 2021-06-04 – 2021-06-14 (×11): 40 mg via SUBCUTANEOUS
  Filled 2021-06-03 (×10): qty 0.4

## 2021-06-03 MED ORDER — ACETAMINOPHEN 650 MG RE SUPP: 650.0000 mg | Freq: Four times a day (QID) | RECTAL | Status: DC | PRN

## 2021-06-03 MED ORDER — ASPIRIN EC 325 MG PO TBEC
325.0000 mg | DELAYED_RELEASE_TABLET | Freq: Every day | ORAL | Status: DC
  Administered 2021-06-04: 325 mg via ORAL
  Filled 2021-06-03 (×2): qty 1

## 2021-06-03 MED ORDER — GADOBUTROL 1 MMOL/ML IV SOLN
9.0000 mL | Freq: Once | INTRAVENOUS | Status: AC | PRN
  Administered 2021-06-03: 9 mL via INTRAVENOUS

## 2021-06-03 NOTE — ED Triage Notes (Addendum)
Patient's wife reports that the patient has been having intermittent slurred speech, frequent falls, and intermittent confusion , delayed speech x 1 week.

## 2021-06-03 NOTE — ED Notes (Signed)
Patients wife would like a call back : Nevin Bloodgood [phone]

## 2021-06-03 NOTE — Plan of Care (Signed)
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 LKW  more than 1 week ago. No emergent intervention indicated at this time. Needs stroke w/u for which he needs transfer to [facility]. Also please get MRA head and neck w/o contrast (has AKI and CTA wont be possible).  Please page neurology once at [facility] - we will see him in person consultation at that time.  -- Amie Portland, MD Neurologist [facility] Pager: [phone]

## 2021-06-03 NOTE — ED Notes (Addendum)
Pt aware urine sample needed, unable to provide one at this time.

## 2021-06-03 NOTE — ED Notes (Addendum)
Pt transported to MRI 

## 2021-06-03 NOTE — ED Provider Triage Note (Signed)
Emergency Medicine Provider Triage Evaluation Note  Ryan Montgomery , a 66 y.o. male  was evaluated in triage.  Pt complains of 1 week duration of falls, weakness, difficulty walking, urinary incontinence.  He denies fever, chills, chest pain, shortness of breath.  He is not on anticoagulation.  Review of Systems  Positive: As above Negative: As above  Physical Exam  Ht 5\' 11"  (1.803 m)    Wt 81.6 kg    BMI 25.10 kg/m  Gen:   Awake, no distress   Resp:  Normal effort  MSK:   Moves extremities without difficulty  Other:  Left upper extremity with 4/5 strength, right upper extremity with 5/5 strength.  Bilateral lower extremities with 5/5 strength.  Without pronator drift.  Without facial droop, cranial nerves III through XII intact.   Medical Decision Making  Medically screening exam initiated at 3:15 PM.  Appropriate orders placed.  Ryan Montgomery was informed that the remainder of the evaluation will be completed by another provider, this initial triage assessment does not replace that evaluation, and the importance of remaining in the ED until their evaluation is complete.     Tereso Newcomer, PA-C 06/03/21 [phone]

## 2021-06-03 NOTE — ED Provider Notes (Addendum)
[facility]-EMERGENCY DEPT Provider Note   CSN: [id number] Arrival date & time: 06/03/21  1457     History  Chief Complaint  Patient presents with   frequent falls   AMS   Aphasia    Ryan Montgomery is a 66 y.o. male.  Patient with a history of hepatitis C and prior history of stroke patient states about 3 years ago.  But states he had no residual deficits.  Patient presenting with a 1 week history of some intermittent confusion and some intermittent slurred speech and frequent falls.  Patient denies any pain or injury from the falls.  Looks like patient was started on Voltaren on December 20.  But is not on any narcotic medication.  Patient denies any fever or any respiratory symptoms any abdominal pain.  Any neck pain or back pain or pain in the upper extremities or lower extremities.      Home Medications Prior to Admission medications   Medication Sig Start Date End Date Taking? Authorizing Provider  diclofenac (VOLTAREN) 75 MG EC tablet Take 1 tablet (75 mg total) by mouth 2 (two) times daily. 05/14/21   Nelwyn Salisbury, MD  tamsulosin (FLOMAX) 0.4 MG CAPS capsule Take 1 capsule (0.4 mg total) by mouth daily after supper. Patient not taking: Reported on 05/14/2021 12/28/20   Melene Plan, DO      Allergies    Moxifloxacin    Review of Systems   Review of Systems  Constitutional:  Negative for chills and fever.  HENT:  Negative for ear pain and sore throat.   Eyes:  Negative for pain and visual disturbance.  Respiratory:  Negative for cough and shortness of breath.   Cardiovascular:  Negative for chest pain and palpitations.  Gastrointestinal:  Negative for abdominal pain and vomiting.  Genitourinary:  Negative for dysuria and hematuria.  Musculoskeletal:  Negative for arthralgias, back pain and neck pain.  Skin:  Negative for color change and rash.  Neurological:  Positive for speech difficulty and weakness. Negative for dizziness, seizures, syncope,  facial asymmetry, light-headedness, numbness and headaches.  Psychiatric/Behavioral:  Positive for confusion.   All other systems reviewed and are negative.  Physical Exam Updated Vital Signs BP (!) 145/86    Pulse 74    Resp 16    Ht 1.803 m (5\' 11" )    Wt 81.6 kg    SpO2 96%    BMI 25.10 kg/m  Physical Exam Vitals and nursing note reviewed.  Constitutional:      General: He is not in acute distress.    Appearance: Normal appearance. He is well-developed.  HENT:     Head: Normocephalic and atraumatic.  Eyes:     Extraocular Movements: Extraocular movements intact.     Conjunctiva/sclera: Conjunctivae normal.     Pupils: Pupils are equal, round, and reactive to light.  Cardiovascular:     Rate and Rhythm: Normal rate and regular rhythm.     Heart sounds: No murmur heard. Pulmonary:     Effort: Pulmonary effort is normal. No respiratory distress.     Breath sounds: Normal breath sounds.  Abdominal:     Palpations: Abdomen is soft.     Tenderness: There is no abdominal tenderness.  Musculoskeletal:        General: No swelling.     Cervical back: Normal range of motion and neck supple.  Skin:    General: Skin is warm and dry.     Capillary Refill: Capillary refill takes  less than 2 seconds.  Neurological:     Mental Status: He is alert and oriented to person, place, and time.     Cranial Nerves: Cranial nerve deficit present.     Motor: Weakness present.     Comments: Patient with some weakness to the left upper extremity.  Patient with some arm drift.  With eyes closed.  Also patient states that his speech is not normal.  Not able to appreciate a change.  Does not know how he normally speaks.  No family members present.  No lower extremity weakness.  Psychiatric:        Mood and Affect: Mood normal.    ED Results / Procedures / Treatments   Labs (all labs ordered are listed, but only abnormal results are displayed) Labs Reviewed  CBC WITH DIFFERENTIAL/PLATELET - Abnormal;  Notable for the following components:      Result Value   MCV 100.5 (*)    All other components within normal limits  BASIC METABOLIC PANEL - Abnormal; Notable for the following components:   Glucose, Bld 107 (*)    Creatinine, Ser 2.13 (*)    GFR, Estimated 34 (*)    All other components within normal limits  RESP PANEL BY RT-PCR (FLU A&B, COVID) [id number]  URINALYSIS, ROUTINE W REFLEX MICROSCOPIC  HEPATIC FUNCTION PANEL  ETHANOL  RAPID URINE DRUG SCREEN, HOSP PERFORMED    EKG EKG Interpretation  Date/Time:  Monday June 03 2021 15:25:31 EST Ventricular Rate:  105 PR Interval:  163 QRS Duration: 83 QT Interval:  337 QTC Calculation: 446 R Axis:   30 Text Interpretation: Sinus tachycardia Borderline T abnormalities, inferior leads Confirmed by Fredia Sorrow [phone]) on 06/03/2021 3:36:13 PM  Radiology DG Chest 2 View  Result Date: 06/03/2021 CLINICAL DATA:  Intermittent slurred speech with frequent falls and confusion EXAM: CHEST - 2 VIEW COMPARISON:  Chest x-ray 12/28/2020, CT 09/09/2020 FINDINGS: The heart size and mediastinal contours are within normal limits. Both lungs are clear. Severe chronic compression fracture at T12. IMPRESSION: No active cardiopulmonary disease. Electronically Signed   By: Donavan Foil M.D.   On: 06/03/2021 15:49   CT Head Wo Contrast  Result Date: 06/03/2021 CLINICAL DATA:  Mental status change. EXAM: CT HEAD WITHOUT CONTRAST TECHNIQUE: Contiguous axial images were obtained from the base of the skull through the vertex without intravenous contrast. COMPARISON:  11/27/2018 FINDINGS: Brain: No acute intracranial hemorrhage. No focal mass lesion. No CT evidence of acute infarction. No midline shift or mass effect. No hydrocephalus. Basilar cisterns are patent. Remote small RIGHT frontal cortical and subcortical infarction. Extensive periventricular and subcortical white matter hypodensities. Generalized cortical atrophy. Vascular: No hyperdense vessel or  unexpected calcification. Skull: Normal. Negative for fracture or focal lesion. Sinuses/Orbits: Paranasal sinuses and mastoid air cells are clear. Orbits are clear. Other: None. IMPRESSION: 1. No acute intracranial findings. 2. 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 3. Remote RIGHT frontal infarction. Electronically Signed   By: Suzy Bouchard M.D.   On: 06/03/2021 16:34    Procedures Procedures    Medications Ordered in ED Medications  0.9 %  sodium chloride infusion (has no administration in time range)    ED Course/ Medical Decision Making/ A&P                           Medical Decision Making  CRITICAL CARE Performed by: Fredia Sorrow Total critical care time: 45 minutes Critical care time  was exclusive of separately billable procedures and treating other patients. Critical care was necessary to treat or prevent imminent or life-threatening deterioration. Critical care was time spent personally by me on the following activities: development of treatment plan with patient and/or surrogate as well as nursing, discussions with consultants, evaluation of patient's response to treatment, examination of patient, obtaining history from patient or surrogate, ordering and performing treatments and interventions, ordering and review of laboratory studies, ordering and review of radiographic studies, pulse oximetry and re-evaluation of patient's condition.   Patient's presentation concerning for recurrent stroke.  Patient's head CT shows evidence of the remote frontal infarct.  But nothing acute.  We will proceed with MRI.  Chest x-ray negative.  We will also screen for influenza and COVID.  No leukocytosis hemoglobin normal at 14.2.  Basic metabolic panel without any significant abnormalities with the exception of a creatinine of 2.13.  Markedly changed from the summer consistent with acute kidney injury.  Patient's GFR today was just 34.  Patient probably warrants  admission just for the acute kidney injury.  But we will proceed with MRI to rule out infarct.  Based on his exam suspect there should be an abnormal finding.  He says his speech is still not back to normal although is difficult for me to appreciate it.  And he does have weakness to the left upper extremity with arm drift.  No significant weakness to the lower extremities.  No obvious evidence of any injuries from the frequent falls and patient denies any.  Head CT showed remote right frontal infarct.  Which goes along with the patient's history.  MRI ordered of the brain definitely has abnormal findings.  Shows multiple infarcts.  Sort of watershed distribution.  Discussed with neurology.  They [facility] the [facility].  We will have the hospitalist admit him there.  They recommended MRA head and neck without contrast.  That is good to have to be done at [facility] because MRI is on for the night.  In addition patient has the acute kidney injury.  And patient is being treated with IV hydration and 100 cc an hour.  And will receive a 500 cc bolus.  Patient CVA symptoms been ongoing for about a week.  So certainly not a candidate for any intervention.  It was Dr. Rory Percy that we spoke to from teleneurology.  Also discussed with the hospitalist here she will make arrangements to get the patient admitted to [facility] I have ordered the MRA head and neck without contrast.   In addition I spoke with the patient's wife Bartolome Durocher and she is aware of the plan for admission and the findings.  She was not surprised that we found new strokes.   Final Clinical Impression(s) / ED Diagnoses Final diagnoses:  AKI (acute kidney injury) ([place])  Cerebrovascular accident (CVA), unspecified mechanism [facility])    Rx / DC Orders ED Discharge Orders     None         Fredia Sorrow, MD 06/03/21 1810    Fredia Sorrow, MD 06/03/21 2246    Fredia Sorrow, MD 06/03/21 2317    Fredia Sorrow,  MD 06/03/21 2318

## 2021-06-03 NOTE — Plan of Care (Signed)
Nursing staff, please page neurology as soon as the patient arrives [facility], thanks.

## 2021-06-03 NOTE — H&P (Signed)
History and Physical    Ryan Montgomery ZHY:[id number] DOB: Apr 23, 1956 DOA: 06/03/2021  PCP: Nelwyn Salisbury, MD Patient coming from: Home  Chief Complaint: Slurred speech  HPI: Ryan Montgomery is a 66 y.o. male with medical history significant of hep C, CVA, tobacco use presented to the ED for evaluation of slurred speech, falls, and confusion x1 week.  No fever or leukocytosis.  Hemoglobin normal.  Blood glucose 107.  Creatinine 2.1, baseline 1.0-1.2.  UA and UDS pending.  LFTs normal.  Blood ethanol level undetectable.  COVID and flu negative.  Chest x-ray showing no active disease.  CT head negative for acute finding.  MRI brain showing 12 mm acute ischemic nonhemorrhagic right pontine infarct.  Additional scattered acute ischemic nonhemorrhagic infarcts involving the cortical and subcortical aspect of the left frontal, parietal, and occipital lobes.  These are watershed in distribution. Neurology consulted and felt that there was no need for emergent intervention at this time as symptoms started more than a week ago.  Recommended admission at [facility] for stroke work-up, MRA head and neck without contrast.  Patient was given 500 cc normal saline bolus and started on maintenance IV fluid.  Patient states about 5 days ago he started having a problem with his speech.  Today he fell at home but did not hit his head or sustain any other injuries.  Denies any weakness or numbness of his arm or leg.  Denies any changes in vision.  He smokes 1/2 pack of cigarettes daily.  Denies history of prior stroke.  He has no other complaints.  Denies fevers, chills, cough, shortness of breath, chest pain, nausea, vomiting, abdominal pain, diarrhea, or dysuria.  Review of Systems:  All systems reviewed and apart from history of presenting illness, are negative.  Past Medical History:  Diagnosis Date   Chickenpox    Chickenpox    Heat stroke    Hepatitis C    Stroke [facility])     Past Surgical History:   Procedure Laterality Date   colonoscopy  08-28-08   per Dr. Jarold Motto, clear, repeat in 10 yrs    fx rt ankle     pins placed   JOINT REPLACEMENT Left 09/12/2019   TOTAL HIP ARTHROPLASTY Left 09/12/2019   per Dr. Josefine Class [facility]      reports that he has been smoking cigarettes. He has been smoking an average of .5 packs per day. He has never used smokeless tobacco. He reports current alcohol use. He reports that he does not use drugs.  Allergies  Allergen Reactions   Moxifloxacin Hives    Family History  Problem Relation Age of Onset   Hypertension Other    Lung cancer Other     Prior to Admission medications   Medication Sig Start Date End Date Taking? Authorizing Provider  diclofenac (VOLTAREN) 75 MG EC tablet Take 1 tablet (75 mg total) by mouth 2 (two) times daily. 05/14/21  Yes Nelwyn Salisbury, MD  tamsulosin (FLOMAX) 0.4 MG CAPS capsule Take 1 capsule (0.4 mg total) by mouth daily after supper. Patient not taking: Reported on 05/14/2021 12/28/20   Melene Plan, DO    Physical Exam: Vitals:   06/03/21 2100 06/03/21 2130 06/03/21 2200 06/03/21 2230  BP: 137/75 131/71 135/63 (!) 143/78  Pulse: 73 71  68  Resp:  18  19  Temp:      SpO2: 93% 94%  95%  Weight:      Height:  Physical Exam Constitutional:      General: He is not in acute distress. HENT:     Head: Normocephalic and atraumatic.  Eyes:     Extraocular Movements: Extraocular movements intact.     Conjunctiva/sclera: Conjunctivae normal.  Cardiovascular:     Rate and Rhythm: Normal rate and regular rhythm.     Pulses: Normal pulses.  Pulmonary:     Effort: Pulmonary effort is normal. No respiratory distress.     Breath sounds: Normal breath sounds. No wheezing or rales.  Abdominal:     General: Bowel sounds are normal. There is no distension.     Palpations: Abdomen is soft.     Tenderness: There is no abdominal tenderness.  Musculoskeletal:        General: No swelling or tenderness.      Cervical back: Normal range of motion and neck supple.  Skin:    General: Skin is warm and dry.  Neurological:     Mental Status: He is alert and oriented to person, place, and time.     Cranial Nerves: No cranial nerve deficit.     Sensory: No sensory deficit.     Motor: No weakness.     Comments: Mild dysarthria     Labs on Admission: I have personally reviewed following labs and imaging studies  CBC: Recent Labs  Lab 06/03/21 1633  WBC 10.3  NEUTROABS 4.6  HGB 14.2  HCT 43.4  MCV 100.5*  PLT [id number]   Basic Metabolic Panel: Recent Labs  Lab 06/03/21 1633  NA 141  K 4.0  CL 109  CO2 26  GLUCOSE 107*  BUN 19  CREATININE 2.13*  CALCIUM 9.0   GFR: Estimated Creatinine Clearance: 37.3 mL/min (A) (by C-G formula based on SCr of 2.13 mg/dL (H)). Liver Function Tests: Recent Labs  Lab 06/03/21 1842  AST 28  ALT 22  ALKPHOS 65  BILITOT 0.8  PROT 8.3*  ALBUMIN 4.1   No results for input(s): LIPASE, AMYLASE in the last 168 hours. No results for input(s): AMMONIA in the last 168 hours. Coagulation Profile: No results for input(s): INR, PROTIME in the last 168 hours. Cardiac Enzymes: No results for input(s): CKTOTAL, CKMB, CKMBINDEX, TROPONINI in the last 168 hours. BNP (last 3 results) No results for input(s): PROBNP in the last 8760 hours. HbA1C: No results for input(s): HGBA1C in the last 72 hours. CBG: No results for input(s): GLUCAP in the last 168 hours. Lipid Profile: No results for input(s): CHOL, HDL, LDLCALC, TRIG, CHOLHDL, LDLDIRECT in the last 72 hours. Thyroid Function Tests: No results for input(s): TSH, T4TOTAL, FREET4, T3FREE, THYROIDAB in the last 72 hours. Anemia Panel: No results for input(s): VITAMINB12, FOLATE, FERRITIN, TIBC, IRON, RETICCTPCT in the last 72 hours. Urine analysis:    Component Value Date/Time   COLORURINE YELLOW 12/28/2020 1559   APPEARANCEUR CLEAR 12/28/2020 1559   LABSPEC 1.025 12/28/2020 1559   PHURINE 5.0  12/28/2020 1559   GLUCOSEU NEGATIVE 12/28/2020 1559   HGBUR SMALL (A) 12/28/2020 1559   HGBUR negative 07/25/2008 0000   BILIRUBINUR NEGATIVE 12/28/2020 1559   BILIRUBINUR neg 07/18/2013 1230   KETONESUR NEGATIVE 12/28/2020 1559   PROTEINUR NEGATIVE 12/28/2020 1559   UROBILINOGEN 0.2 07/18/2013 1230   UROBILINOGEN 0.2 07/25/2008 0000   NITRITE NEGATIVE 12/28/2020 1559   LEUKOCYTESUR NEGATIVE 12/28/2020 1559    Radiological Exams on Admission: DG Chest 2 View  Result Date: 06/03/2021 CLINICAL DATA:  Intermittent slurred speech with frequent falls and confusion EXAM:  CHEST - 2 VIEW COMPARISON:  Chest x-ray 12/28/2020, CT 09/09/2020 FINDINGS: The heart size and mediastinal contours are within normal limits. Both lungs are clear. Severe chronic compression fracture at T12. IMPRESSION: No active cardiopulmonary disease. Electronically Signed   By: Donavan Foil M.D.   On: 06/03/2021 15:49   CT Head Wo Contrast  Result Date: 06/03/2021 CLINICAL DATA:  Mental status change. EXAM: CT HEAD WITHOUT CONTRAST TECHNIQUE: Contiguous axial images were obtained from the base of the skull through the vertex without intravenous contrast. COMPARISON:  11/27/2018 FINDINGS: Brain: No acute intracranial hemorrhage. No focal mass lesion. No CT evidence of acute infarction. No midline shift or mass effect. No hydrocephalus. Basilar cisterns are patent. Remote small RIGHT frontal cortical and subcortical infarction. Extensive periventricular and subcortical white matter hypodensities. Generalized cortical atrophy. Vascular: No hyperdense vessel or unexpected calcification. Skull: Normal. Negative for fracture or focal lesion. Sinuses/Orbits: Paranasal sinuses and mastoid air cells are clear. Orbits are clear. Other: None. IMPRESSION: 1. No acute intracranial findings. 2. 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 3. Remote RIGHT frontal infarction. Electronically Signed   By: Suzy Bouchard M.D.    On: 06/03/2021 16:34   MR Brain Wo Contrast (neuro protocol)  Result Date: 06/03/2021 CLINICAL DATA:  Initial evaluation for neuro deficit, stroke suspected./ EXAM: MRI HEAD WITHOUT CONTRAST TECHNIQUE: Multiplanar, multiecho pulse sequences of the brain and surrounding structures were obtained without intravenous contrast. COMPARISON:  Head CT from earlier the same day. FINDINGS: Brain: Cerebral volume within normal limits for age. Patchy and confluent T2/FLAIR hyperintensity involving the periventricular deep white matter both cerebral hemispheres most consistent with chronic small vessel ischemic disease, moderate to advanced in nature. Multiple scattered remote lacunar infarcts present about the bilateral basal ganglia/hemispheric cerebral white matter. Chronic right MCA territory infarct noted at the high right frontal lobe. Additional chronic left cerebellar infarcts noted. Additional small areas of encephalomalacia and gliosis involving the anterior frontal poles favored to be related to remote trauma. 12 mm acute ischemic infarcts seen involving the right pons (series 5, image 62). Additional scattered acute ischemic infarcts seen involving the cortical and subcortical aspect of the left frontal, parietal, and occipital regions, consistent with acute ischemic infarcts as well (series 5, image 84). These are watershed in distribution. No associated hemorrhage or significant mass effect about these areas of infarction. Otherwise, gray-white matter differentiation maintained. No areas of acute or chronic intracranial hemorrhage. No mass lesion, midline shift or mass effect. No hydrocephalus or extra-axial fluid collection. Pituitary gland suprasellar region normal. Midline structures intact. Vascular: Major intracranial vascular flow voids are maintained. Skull and upper cervical spine: Craniocervical junction within normal limits. Bone marrow signal intensity normal. No scalp soft tissue abnormality.  Sinuses/Orbits: Globes and orbital soft tissues demonstrate no acute finding. Mild scattered mucosal thickening noted within the ethmoidal air cells. Paranasal sinuses are otherwise clear. Trace right mastoid effusion noted, of doubtful significance. Other: None. IMPRESSION: 1. 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 2. Additional scattered acute ischemic nonhemorrhagic infarcts involving the cortical and subcortical aspect of the left frontal, parietal, and occipital lobes. These are watershed in distribution. 3. Underlying moderate to advanced chronic microvascular ischemic disease with multiple remote infarcts involving the right frontal lobe, bilateral basal ganglia/hemispheric cerebral white matter, left cerebellum. 4. Additional small areas of encephalomalacia involving the anterior frontal lobes bilaterally, likely related to remote trauma. Electronically Signed   By: Jeannine Boga M.D.   On: 06/03/2021 20:09  EKG: Independently reviewed. Sinus tachycardia, borderline T wave abnormalities.  No significant change since prior tracing.  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  Per patient, LKW 5 days ago. -Patient is being transferred to [facility] for stroke work-up -MRA head and neck without contrast ordered -Telemetry monitoring -TTE -Hemoglobin A1c, fasting lipid panel -Aspirin 325 mg daily -Atorvastatin 80 mg daily -Frequent neurochecks -PT, OT, speech therapy. -N.p.o. 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  Creatinine 2.1, baseline 1.0-1.2. -IV fluid hydration.  Monitor renal function and urine output.  Avoid  nephrotoxic agents/contrast.  Tobacco use Smokes 1/2 pack of cigarettes daily. -NicoDerm patch and counseling  DVT prophylaxis: Lovenox Code Status: Full code Family Communication: No family available at this time. Disposition Plan:  Status is: Inpatient  Remains inpatient appropriate because: Acute stroke  Level of care: Level of care: Telemetry Medical  The medical decision making on this patient was of high complexity and the patient is at high risk for clinical deterioration, therefore this is a level 3 visit.  Shela Leff MD [facility]  If 7PM-7AM, please contact night-coverage [email or web address]  06/04/2021, 12:00 AM

## 2021-06-04 ENCOUNTER — Inpatient Hospital Stay (HOSPITAL_COMMUNITY): Payer: No Typology Code available for payment source

## 2021-06-04 DIAGNOSIS — N179 Acute kidney failure, unspecified: Secondary | ICD-10-CM

## 2021-06-04 DIAGNOSIS — Z720 Tobacco use: Secondary | ICD-10-CM

## 2021-06-04 DIAGNOSIS — I77819 Aortic ectasia, unspecified site: Secondary | ICD-10-CM

## 2021-06-04 DIAGNOSIS — I6389 Other cerebral infarction: Secondary | ICD-10-CM

## 2021-06-04 DIAGNOSIS — R1310 Dysphagia, unspecified: Secondary | ICD-10-CM

## 2021-06-04 LAB — BASIC METABOLIC PANEL
Anion gap: 6 (ref 5–15)
BUN: 16 mg/dL (ref 8–23)
CO2: 25 mmol/L (ref 22–32)
Calcium: 8.4 mg/dL — ABNORMAL LOW (ref 8.9–10.3)
Chloride: 108 mmol/L (ref 98–111)
Creatinine, Ser: 1.47 mg/dL — ABNORMAL HIGH (ref 0.61–1.24)
GFR, Estimated: 53 mL/min — ABNORMAL LOW (ref >60)
Glucose, Bld: 106 mg/dL — ABNORMAL HIGH (ref 70–99)
Potassium: 3.7 mmol/L (ref 3.5–5.1)
Sodium: 139 mmol/L (ref 135–145)

## 2021-06-04 LAB — URINALYSIS, ROUTINE W REFLEX MICROSCOPIC
Bacteria, UA: NONE SEEN
Bilirubin Urine: NEGATIVE
Glucose, UA: NEGATIVE mg/dL
Hgb urine dipstick: NEGATIVE
Ketones, ur: NEGATIVE mg/dL
Leukocytes,Ua: NEGATIVE
Nitrite: NEGATIVE
Protein, ur: 30 mg/dL — AB
Specific Gravity, Urine: 1.029 (ref 1.005–1.030)
pH: 5 (ref 5.0–8.0)

## 2021-06-04 LAB — ECHOCARDIOGRAM COMPLETE
AR max vel: 3.81 cm2
AV Peak grad: 3.6 mmHg
Ao pk vel: 0.95 m/s
Area-P 1/2: 5.02 cm2
Calc EF: 57.2 %
Height: 71 in
P 1/2 time: 605 msec
S' Lateral: 3.2 cm
Single Plane A2C EF: 56.9 %
Single Plane A4C EF: 57.8 %
Weight: 2880 oz

## 2021-06-04 LAB — RAPID URINE DRUG SCREEN, HOSP PERFORMED
Amphetamines: NOT DETECTED
Barbiturates: NOT DETECTED
Benzodiazepines: NOT DETECTED
Cocaine: NOT DETECTED
Opiates: NOT DETECTED
Tetrahydrocannabinol: NOT DETECTED

## 2021-06-04 MED ORDER — ASPIRIN 300 MG RE SUPP: 300.0000 mg | Freq: Every day | RECTAL | Status: DC

## 2021-06-04 MED ORDER — CLOPIDOGREL BISULFATE 75 MG PO TABS
75.0000 mg | ORAL_TABLET | Freq: Every day | ORAL | Status: DC
  Administered 2021-06-05 – 2021-06-14 (×10): 75 mg via ORAL
  Filled 2021-06-04 (×10): qty 1

## 2021-06-04 MED ORDER — STROKE: EARLY STAGES OF RECOVERY BOOK: Freq: Once | Status: AC

## 2021-06-04 MED ORDER — ASPIRIN 325 MG PO TABS
325.0000 mg | ORAL_TABLET | Freq: Every day | ORAL | Status: DC
  Administered 2021-06-05: 325 mg via ORAL
  Filled 2021-06-04: qty 1

## 2021-06-04 MED ORDER — LABETALOL HCL 5 MG/ML IV SOLN: 10.0000 mg | INTRAVENOUS | Status: DC | PRN

## 2021-06-04 NOTE — ED Notes (Signed)
Pt's wife updated. Pt's spouse states no further questions at this time.

## 2021-06-04 NOTE — Assessment & Plan Note (Addendum)
--   Confirmed with MRI brain.  Mild dysarthria.  Dysphagia, failed swallow screen. -- As per neurology recommendation to transfer to [facility] for stroke evaluation. -- MRA head neck no large vessel occlusion. -- Currently n.p.o. failed swallow screen.  Change aspirin to per rectum.  Resume statin when able to take p.o.  Further recommendations per neurology.

## 2021-06-04 NOTE — ED Notes (Signed)
[facility] called for pt. Transportation

## 2021-06-04 NOTE — Assessment & Plan Note (Signed)
--   Secondary to stroke.  Failed swallow screen.  Speech therapy evaluation.  IV fluids.  May need alternative nutrition.

## 2021-06-04 NOTE — Consult Note (Signed)
Neurology Consultation  Reason for Consult: Stroke Referring Physician: Dr. Jenelle Mages. [facility]  CC: Dysarthria  History is obtained from: Chart, patient  HPI: Ryan Montgomery is a 66 y.o. male prior history of hepatitis C, prior stroke with no residual deficits, tobacco abuse, presented to ED for evaluation of about 1 week worth of slurred speech, falls and feeling off of his mental baseline.  He reports that he has had slurred speech now ongoing for 7 to 8 days.  Initially did not think much of it but when the symptoms did not improve, he came to the emergency room.  He was evaluated in the emergency room at [facility] with an MRI of the brain which showed acute to subacute nonhemorrhagic right pontine infarct and additionally scattered nonhemorrhagic infarcts involving the cortical and subcortical aspect of left frontal parietal and occipital lobe concerning for watershed distribution. Given his symptoms ongoing for a week, no emergent intervention or thrombolysis is indicated. He was seen at [facility] long hospital pending transfer to [facility] for upwards of 24 hours at this time. He reports that his speech has become better over the past day or so. Denies any chest pain nausea vomiting shortness of breath palpitations fevers chills. Was not taking any antiplatelets or anticoagulants at home.  LKW: Greater than a week ago tpa given?: no, outside the window Premorbid modified Rankin scale (mRS): 0  ROS: Full ROS was performed and is negative except as noted in the HPI.   Past Medical History:  Diagnosis Date   Chickenpox    Chickenpox    Heat stroke    Hepatitis C    Stroke ([place])    Family History  Problem Relation Age of Onset   Hypertension Other    Lung cancer Other    Social History:   reports that he has been smoking cigarettes. He has been smoking an average of .5 packs per day. He has never used smokeless tobacco. He reports  current alcohol use. He reports that he does not use drugs.  Medications  Current Facility-Administered Medications:    acetaminophen (TYLENOL) tablet 650 mg, 650 mg, Oral, Q6H PRN **OR** acetaminophen (TYLENOL) suppository 650 mg, 650 mg, Rectal, Q6H PRN, Shela Leff, MD   [START ON 06/05/2021] aspirin suppository 300 mg, 300 mg, Rectal, Daily, Samuella Cota, MD   enoxaparin (LOVENOX) injection 40 mg, 40 mg, Subcutaneous, Q24H, Shela Leff, MD, 40 mg at 06/04/21 0908   nicotine (NICODERM CQ - dosed in mg/24 hours) patch 14 mg, 14 mg, Transdermal, Daily, Shela Leff, MD, 14 mg at 06/04/21 0902  Current Outpatient Medications:    diclofenac (VOLTAREN) 75 MG EC tablet, Take 1 tablet (75 mg total) by mouth 2 (two) times daily., Disp: 60 tablet, Rfl: 2   tamsulosin (FLOMAX) 0.4 MG CAPS capsule, Take 1 capsule (0.4 mg total) by mouth daily after supper. (Patient not taking: Reported on 05/14/2021), Disp: 30 capsule, Rfl: 0   Exam: Current vital signs: BP (!) 143/61    Pulse 64    Temp 98.9 F (37.2 C)    Resp 14    Ht 5\' 11"  (1.803 m)    Wt 81.6 kg    SpO2 96%    BMI 25.10 kg/m  Vital signs in last 24 hours: Pulse Rate:  [53-86] 64 (01/10 1900) Resp:  [13-26] 14 (01/10 1900) BP: (100-164)/(59-96) 143/61 (01/10 1900) SpO2:  [95 %-100 %] 96 % (01/10 1900)  GENERAL: Awake, alert in NAD HEENT: -  Normocephalic and atraumatic, dry mm, no LN++, no Thyromegally LUNGS - Clear to auscultation bilaterally with no wheezes CV - S1S2 RRR, no m/r/g, equal pulses bilaterally. ABDOMEN - Soft, nontender, nondistended with normoactive BS Ext: warm, well perfused, intact peripheral pulses, no edema  NEURO:  Mental Status: AA&Ox3  Language: speech is mildly dysarthric.  Naming, repetition, fluency, and comprehension intact. Cranial Nerves: PERRL. EOMI, visual fields full, question mild left facial weakness at rest but no weakness on smiling, facial sensation intact, hearing intact,  tongue/uvula/soft palate midline, normal sternocleidomastoid and trapezius muscle strength. No evidence of tongue atrophy or fibrillations Motor: Antigravity in all fours without drift Tone: is normal and bulk is normal Sensation- Intact to light touch bilaterally Coordination: FTN intact bilaterally, no ataxia in BLE. Gait- deferred  NIHSS-1 for dysarthria   Labs I have reviewed labs in epic and the results pertinent to this consultation are:  CBC    Component Value Date/Time   WBC 10.3 06/03/2021 1633   RBC 4.32 06/03/2021 1633   HGB 14.2 06/03/2021 1633   HCT 43.4 06/03/2021 1633   PLT 183 06/03/2021 1633   MCV 100.5 (H) 06/03/2021 1633   MCH 32.9 06/03/2021 1633   MCHC 32.7 06/03/2021 1633   RDW 13.7 06/03/2021 1633   LYMPHSABS 4.9 (H) 06/03/2021 1633   MONOABS 0.7 06/03/2021 1633   EOSABS 0.0 06/03/2021 1633   BASOSABS 0.1 06/03/2021 1633    CMP     Component Value Date/Time   NA 139 06/04/2021 0418   K 3.7 06/04/2021 0418   CL 108 06/04/2021 0418   CO2 25 06/04/2021 0418   GLUCOSE 106 (H) 06/04/2021 0418   BUN 16 06/04/2021 0418   CREATININE 1.47 (H) 06/04/2021 0418   CALCIUM 8.4 (L) 06/04/2021 0418   PROT 8.3 (H) 06/03/2021 1842   ALBUMIN 4.1 06/03/2021 1842   AST 28 06/03/2021 1842   ALT 22 06/03/2021 1842   ALKPHOS 65 06/03/2021 1842   BILITOT 0.8 06/03/2021 1842   GFRNONAA 53 (L) 06/04/2021 0418   GFRAA >60 11/29/2019 0307    LDL pending  A1c pending  2D echo LVEF 55 to 60% with mild concentric LVH.  Left ventricular diastolic pattern normal. LA size mildly dilated Mitral valve normal Aortic valve tricuspid with mild aortic valve regurgitation.  No aortic stenosis. Aortic dilatation noted-moderate dilatation of the aortic root measuring at 44 mm. No atrial level shunt detected by color Doppler flow.  Imaging I have reviewed the images obtained: MRI examination of the brain-right pontine infarct, additionally acute ischemic nonhemorrhagic  infarcts in the cortical/subcortical aspect of left frontal parietal and occipital lobe and watershed distribution.  Underlying moderate to advanced chronic microvascular ischemic disease with multiple remote infarcts involving the right frontal lobe, bilateral basal ganglia, hemispheric cerebral white matter bilaterally and left cerebellum.  Additionally small areas of encephalomalacia involving the anterior frontal lobes bilaterally likely related to remote trauma.  MR angiography head and neck-without contrast-negative for large vessel occlusion or hemodynamically significant correctable stenosis.  Assessment:  66 year old man with above past medical history presenting for 1 week or more worth of slurred speech difficulty with balance and confusion noted to have an acute/subacute right pontine infarct along with scattered infarctions in a watershed looking distribution in the left posterior watershed territory. MRA of the head and neck not significant for any stenosis. Raises suspicion for cardioembolic etiology and needs further work-up. 2D echocardiogram with mild left atrial dilatation which can sometimes be seen with paroxysmal atrial  fibrillation although no A. fib noted on telemetry now.   Impression: Acute ischemic stroke-concomitant small vessel disease versus cardioembolic.  Recommendations: Dual antiplatelet-aspirin 325 and Plavix 75 for now. May need TEE and loop recorder placement given the distribution of his strokes. Atorvastatin 80 Lipid panel A1c Frequent neurochecks Repeat swallow screen-if passes, can resume diet and p.o. medications.  Presumably failed bedside swallow screen yesterday. No need for permissive hypertension Goal blood pressure-normotension PT OT Speech therapy Stroke team will follow   -- Amie Portland, MD Neurologist [facility] Pager: [phone]

## 2021-06-04 NOTE — Progress Notes (Signed)
°  Progress Note   Patient: Ryan Montgomery [id number] DOB: 12/29/1955 DOA: 06/03/2021     1 DOS: the patient was seen and examined on 06/04/2021   Brief hospital course: 66 year old man presenting with slurred speech and falls for about a week.  Admitted for stroke confirmed with MRI brain.  No acute intervention per neurology given delay in presentation.  Plan to transfer to [facility] for further evaluation.  Assessment and Plan * Acute CVA (cerebrovascular accident) ([place]) -- Confirmed with MRI brain.  Mild dysarthria.  Dysphagia, failed swallow screen. -- As per neurology recommendation to transfer to [facility] for stroke evaluation. -- MRA head neck no large vessel occlusion. -- Currently n.p.o. failed swallow screen.  Change aspirin to per rectum.  Resume statin when able to take p.o.  Further recommendations per neurology.  Dysphagia -- Secondary to stroke.  Failed swallow screen.  Speech therapy evaluation.  IV fluids.  May need alternative nutrition.  AKI (acute kidney injury) (HCC) --Versus CKD.  IV fluids.  Check BMP in AM.  Aortic dilatation (HCC) -- Seen on echocardiogram.  Recommend outpatient follow-up with cardiology.  Tobacco use -- Recommend smoking cessation. Nicotine patch     Subjective:  Feels ok Some difficulty swallowing and speaking No numbness Has chronic right hand pain (can't make a fist) Breathing ok  Objective Vital signs were reviewed and unremarkable. Physical Exam Vitals reviewed.  Constitutional:      General: He is not in acute distress.    Appearance: He is not ill-appearing or toxic-appearing.  Cardiovascular:     Rate and Rhythm: Normal rate and regular rhythm.     Heart sounds: No murmur heard. Pulmonary:     Effort: Pulmonary effort is normal. No respiratory distress.     Breath sounds: No rhonchi.  Abdominal:     Palpations: Abdomen is soft.  Musculoskeletal:     Right lower leg: No edema.     Left lower leg: No edema.      Comments: Cannot make full fist w/ right hand. Middle phalanx more prominent than others. No pain. Chronic problem. Motor grossly intact all extremities.  Neurological:     Cranial Nerves: Cranial nerve deficit present.     Comments: Subtle left facial weakness Subtle dysarthria Subtle dysdiadokinesis RUE Sensation grossly intact  Psychiatric:        Mood and Affect: Mood normal.        Behavior: Behavior normal.     Data Reviewed:  Creatinine better today 1.47, baseline appears to be around 1.2 perhaps. MRA head and neck no large vessel occlusion MRI brain 12 mm acute ischemic nonhemorrhagic right pontine infarct, additional scattered acute ischemic nonhemorrhagic infarcts in watershed distribution. Chest x-ray independently reviewed no acute disease, EKG independently reviewed sinus tachycardia no acute changes  Family Communication: none  Disposition: Status is: Inpatient  Remains inpatient appropriate because: stroke eval, can't swallow          Time spent: 35 minutes  Author: Murray Hodgkins, MD 06/04/2021 5:22 PM  For on call review [email or web address].

## 2021-06-04 NOTE — ED Notes (Signed)
Gunnar Fusi patients wife would like a call with an update: [phone]

## 2021-06-04 NOTE — ED Notes (Signed)
Received patient at 0130, attempted to complete NIH stroke scale and Stroke Swallow Screen. Patient continues to refuse, states he has been here all day and does not wish to participate at this time. Explained to patient that in order to give him food, he would need to pass a stroke swallow screen. Patient states he was told by a doctor earlier in the day that a stroke swallow screen would be done but no one ever showed up to do it. Tried reasoning with patient and explained to him that I had only joined his care team a short time prior but was happy to complete his screening now so that he could eat. Patient continues to refuse.

## 2021-06-04 NOTE — ED Notes (Signed)
Called report to accepting nurse at [facility].

## 2021-06-04 NOTE — Assessment & Plan Note (Signed)
--   Recommend smoking cessation. Nicotine patch

## 2021-06-04 NOTE — ED Notes (Signed)
Stroke swallow screen performed on patient, Patient failed. Pt informed that he will be kept NPO until further notice. MD aware

## 2021-06-04 NOTE — Hospital Course (Addendum)
66 year old man presenting with slurred speech and falls for about a week.  Admitted for stroke confirmed with MRI brain.  No acute intervention per neurology given delay in presentation.  Plan to transfer to [facility] for further evaluation.

## 2021-06-04 NOTE — Assessment & Plan Note (Signed)
--   Seen on echocardiogram.  Recommend outpatient follow-up with cardiology.

## 2021-06-04 NOTE — Assessment & Plan Note (Signed)
--  Versus CKD.  IV fluids.  Check BMP in AM.

## 2021-06-05 ENCOUNTER — Encounter (HOSPITAL_COMMUNITY): Payer: No Typology Code available for payment source

## 2021-06-05 LAB — RPR: RPR Ser Ql: NONREACTIVE

## 2021-06-05 LAB — BASIC METABOLIC PANEL
Anion gap: 9 (ref 5–15)
BUN: 10 mg/dL (ref 8–23)
CO2: 23 mmol/L (ref 22–32)
Calcium: 8.5 mg/dL — ABNORMAL LOW (ref 8.9–10.3)
Chloride: 112 mmol/L — ABNORMAL HIGH (ref 98–111)
Creatinine, Ser: 1.11 mg/dL (ref 0.61–1.24)
GFR, Estimated: >60 mL/min (ref >60)
Glucose, Bld: 85 mg/dL (ref 70–99)
Potassium: 3.5 mmol/L (ref 3.5–5.1)
Sodium: 144 mmol/L (ref 135–145)

## 2021-06-05 LAB — HEMOGLOBIN A1C
Hgb A1c MFr Bld: 4.7 % — ABNORMAL LOW (ref 4.8–5.6)
Mean Plasma Glucose: 88.19 mg/dL

## 2021-06-05 LAB — LIPID PANEL
Cholesterol: 117 mg/dL (ref 0–200)
HDL: 33 mg/dL — ABNORMAL LOW (ref >40)
LDL Cholesterol: 63 mg/dL (ref 0–99)
Total CHOL/HDL Ratio: 3.5 RATIO
Triglycerides: 104 mg/dL (ref <150)
VLDL: 21 mg/dL (ref 0–40)

## 2021-06-05 LAB — VITAMIN B12: Vitamin B-12: 493 pg/mL (ref 180–914)

## 2021-06-05 LAB — HIV ANTIBODY (ROUTINE TESTING W REFLEX): HIV Screen 4th Generation wRfx: NONREACTIVE

## 2021-06-05 LAB — TSH: TSH: 2.277 u[IU]/mL (ref 0.350–4.500)

## 2021-06-05 MED ORDER — SODIUM CHLORIDE 0.9 % IV SOLN: INTRAVENOUS | Status: DC

## 2021-06-05 MED ORDER — ASPIRIN EC 81 MG PO TBEC
81.0000 mg | DELAYED_RELEASE_TABLET | Freq: Every day | ORAL | Status: DC
  Administered 2021-06-06 – 2021-06-14 (×9): 81 mg via ORAL
  Filled 2021-06-05 (×9): qty 1

## 2021-06-05 MED ORDER — ATORVASTATIN CALCIUM 10 MG PO TABS
20.0000 mg | ORAL_TABLET | Freq: Every day | ORAL | Status: DC
  Administered 2021-06-05 – 2021-06-14 (×10): 20 mg via ORAL
  Filled 2021-06-05 (×10): qty 2

## 2021-06-05 NOTE — Plan of Care (Signed)
°  Problem: Education: Goal: Knowledge of disease or condition will improve Outcome: Progressing Goal: Knowledge of secondary prevention will improve (SELECT ALL) Outcome: Progressing Goal: Knowledge of patient specific risk factors will improve (INDIVIDUALIZE FOR PATIENT) Outcome: Progressing   Problem: Health Behavior/Discharge Planning: Goal: Ability to manage health-related needs will improve Outcome: Progressing   Problem: Self-Care: Goal: Ability to participate in self-care as condition permits will improve Outcome: Progressing Goal: Ability to communicate needs accurately will improve Outcome: Progressing   Problem: Nutrition: Goal: Risk of aspiration will decrease Outcome: Progressing

## 2021-06-05 NOTE — Progress Notes (Signed)
STROKE TEAM PROGRESS NOTE   SUBJECTIVE (INTERVAL HISTORY) No family is at the bedside.  Overall his condition is rapidly improving.  Per patient, he had a stroke about 2 to 3 years ago but no residual deficit.  However he cannot provide further details about his stroke.  11/2019 he had episode of loss of consciousness, not sure exact etiology.  However, Dr. Leonel Ramsay saw him concerning for seizure due to some jerking movement, EEG negative, put on Keppra 500.  However, Keppra was not on his current medication list.  He admitted that 5 years ago he had 30 minutes heart palpitation, fluttering but he did not seek for medical attention.   OBJECTIVE Temp:  [97.7 F (36.5 C)-98.3 F (36.8 C)] 98 F (36.7 C) (01/11 1523) Pulse Rate:  [57-81] 81 (01/11 1523) Cardiac Rhythm: Normal sinus rhythm (01/11 0700) Resp:  [14-17] 16 (01/11 1523) BP: (143-181)/(61-117) 168/98 (01/11 1523) SpO2:  [96 %-100 %] 99 % (01/11 1523) Weight:  [81.8 kg] 81.8 kg (01/10 2200)  No results for input(s): GLUCAP in the last 168 hours. Recent Labs  Lab 06/03/21 1633 06/04/21 0418 06/05/21 0603  NA 141 139 144  K 4.0 3.7 3.5  CL 109 108 112*  CO2 26 25 23   GLUCOSE 107* 106* 85  BUN 19 16 10   CREATININE 2.13* 1.47* 1.11  CALCIUM 9.0 8.4* 8.5*   Recent Labs  Lab 06/03/21 1842  AST 28  ALT 22  ALKPHOS 65  BILITOT 0.8  PROT 8.3*  ALBUMIN 4.1   Recent Labs  Lab 06/03/21 1633  WBC 10.3  NEUTROABS 4.6  HGB 14.2  HCT 43.4  MCV 100.5*  PLT 183   No results for input(s): CKTOTAL, CKMB, CKMBINDEX, TROPONINI in the last 168 hours. No results for input(s): LABPROT, INR in the last 72 hours. Recent Labs    06/04/21 0030  COLORURINE YELLOW  LABSPEC 1.029  PHURINE 5.0  GLUCOSEU NEGATIVE  HGBUR NEGATIVE  BILIRUBINUR NEGATIVE  KETONESUR NEGATIVE  PROTEINUR 30*  NITRITE NEGATIVE  LEUKOCYTESUR NEGATIVE       Component Value Date/Time   CHOL 117 06/05/2021 0328   TRIG 104 06/05/2021 0328   HDL 33  (L) 06/05/2021 0328   CHOLHDL 3.5 06/05/2021 0328   VLDL 21 06/05/2021 0328   LDLCALC 63 06/05/2021 0328   Lab Results  Component Value Date   HGBA1C 4.7 (L) 06/05/2021      Component Value Date/Time   LABOPIA NONE DETECTED 06/04/2021 0030   COCAINSCRNUR NONE DETECTED 06/04/2021 0030   LABBENZ NONE DETECTED 06/04/2021 0030   AMPHETMU NONE DETECTED 06/04/2021 0030   THCU NONE DETECTED 06/04/2021 0030   LABBARB NONE DETECTED 06/04/2021 0030    Recent Labs  Lab 06/03/21 1842  ETH <10    I have personally reviewed the radiological images below and agree with the radiology interpretations.  DG Chest 2 View  Result Date: 06/03/2021 CLINICAL DATA:  Intermittent slurred speech with frequent falls and confusion EXAM: CHEST - 2 VIEW COMPARISON:  Chest x-ray 12/28/2020, CT 09/09/2020 FINDINGS: The heart size and mediastinal contours are within normal limits. Both lungs are clear. Severe chronic compression fracture at T12. IMPRESSION: No active cardiopulmonary disease. Electronically Signed   By: Donavan Foil M.D.   On: 06/03/2021 15:49   CT Head Wo Contrast  Result Date: 06/03/2021 CLINICAL DATA:  Mental status change. EXAM: CT HEAD WITHOUT CONTRAST TECHNIQUE: Contiguous axial images were obtained from the base of the skull through the vertex without intravenous  contrast. COMPARISON:  11/27/2018 FINDINGS: Brain: No acute intracranial hemorrhage. No focal mass lesion. No CT evidence of acute infarction. No midline shift or mass effect. No hydrocephalus. Basilar cisterns are patent. Remote small RIGHT frontal cortical and subcortical infarction. Extensive periventricular and subcortical white matter hypodensities. Generalized cortical atrophy. Vascular: No hyperdense vessel or unexpected calcification. Skull: Normal. Negative for fracture or focal lesion. Sinuses/Orbits: Paranasal sinuses and mastoid air cells are clear. Orbits are clear. Other: None. IMPRESSION: 1. No acute intracranial findings.  2. 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 3. Remote RIGHT frontal infarction. Electronically Signed   By: Suzy Bouchard M.D.   On: 06/03/2021 16:34   MR ANGIO HEAD WO CONTRAST  Result Date: 06/04/2021 CLINICAL DATA:  Initial evaluation for acute stroke. EXAM: MRA NECK WITHOUT AND WITH CONTRAST MRA HEAD WITHOUT CONTRAST TECHNIQUE: Multiplanar and multiecho pulse sequences of the neck were obtained without and with intravenous contrast. Angiographic images of the neck were obtained using MRA technique without and with intravenous contrast; Angiographic images of the Circle of Willis were obtained using MRA technique without intravenous contrast. CONTRAST:  9 cc of Gadavist. COMPARISON:  Previous MRI from earlier the same day. FINDINGS: MRA NECK FINDINGS AORTIC ARCH: Partially visualized aortic arch normal in caliber with normal branch pattern. No stenosis seen about the origin of the great vessels. RIGHT CAROTID SYSTEM: Right common and internal carotid arteries patent without stenosis or dissection. No significant atheromatous narrowing about the right carotid bulb. LEFT CAROTID SYSTEM: Left common and internal carotid arteries patent without stenosis or dissection. No significant atheromatous narrowing about the left carotid wall. VERTEBRAL ARTERIES: Both vertebral arteries arise from the subclavian arteries. Right vertebral artery slightly dominant. No visible proximal subclavian artery stenosis. Vertebral arteries patent without stenosis or evidence for dissection. MRA HEAD FINDINGS ANTERIOR CIRCULATION: Both internal carotid arteries widely patent to the termini without stenosis or other abnormality. A1 segments widely patent. Normal anterior communicating artery complex. Anterior cerebral arteries widely patent without stenosis. No M1 stenosis or occlusion. Normal MCA bifurcations. Distal MCA branches well perfused and symmetric. POSTERIOR CIRCULATION: Visualized V4 segments  widely patent bilaterally. Right vertebral artery dominant. Partially visualized PICA patent bilaterally. Basilar patent to its distal aspect without stenosis. Superior cerebellar are and posterior cerebral arteries widely patent bilaterally. Small bilateral posterior communicating arteries noted. Anatomic variants: None significant.  No aneurysm. IMPRESSION: Negative MRA of the head and neck. No large vessel occlusion. No hemodynamically significant or correctable stenosis. Electronically Signed   By: Jeannine Boga M.D.   On: 06/04/2021 00:30   MR Angiogram Neck W or Wo Contrast  Result Date: 06/04/2021 CLINICAL DATA:  Initial evaluation for acute stroke. EXAM: MRA NECK WITHOUT AND WITH CONTRAST MRA HEAD WITHOUT CONTRAST TECHNIQUE: Multiplanar and multiecho pulse sequences of the neck were obtained without and with intravenous contrast. Angiographic images of the neck were obtained using MRA technique without and with intravenous contrast; Angiographic images of the Circle of Willis were obtained using MRA technique without intravenous contrast. CONTRAST:  9 cc of Gadavist. COMPARISON:  Previous MRI from earlier the same day. FINDINGS: MRA NECK FINDINGS AORTIC ARCH: Partially visualized aortic arch normal in caliber with normal branch pattern. No stenosis seen about the origin of the great vessels. RIGHT CAROTID SYSTEM: Right common and internal carotid arteries patent without stenosis or dissection. No significant atheromatous narrowing about the right carotid bulb. LEFT CAROTID SYSTEM: Left common and internal carotid arteries patent without stenosis or dissection. No significant atheromatous  narrowing about the left carotid wall. VERTEBRAL ARTERIES: Both vertebral arteries arise from the subclavian arteries. Right vertebral artery slightly dominant. No visible proximal subclavian artery stenosis. Vertebral arteries patent without stenosis or evidence for dissection. MRA HEAD FINDINGS ANTERIOR  CIRCULATION: Both internal carotid arteries widely patent to the termini without stenosis or other abnormality. A1 segments widely patent. Normal anterior communicating artery complex. Anterior cerebral arteries widely patent without stenosis. No M1 stenosis or occlusion. Normal MCA bifurcations. Distal MCA branches well perfused and symmetric. POSTERIOR CIRCULATION: Visualized V4 segments widely patent bilaterally. Right vertebral artery dominant. Partially visualized PICA patent bilaterally. Basilar patent to its distal aspect without stenosis. Superior cerebellar are and posterior cerebral arteries widely patent bilaterally. Small bilateral posterior communicating arteries noted. Anatomic variants: None significant.  No aneurysm. IMPRESSION: Negative MRA of the head and neck. No large vessel occlusion. No hemodynamically significant or correctable stenosis. Electronically Signed   By: Rise Mu M.D.   On: 06/04/2021 00:30   MR Brain Wo Contrast (neuro protocol)  Result Date: 06/03/2021 CLINICAL DATA:  Initial evaluation for neuro deficit, stroke suspected./ EXAM: MRI HEAD WITHOUT CONTRAST TECHNIQUE: Multiplanar, multiecho pulse sequences of the brain and surrounding structures were obtained without intravenous contrast. COMPARISON:  Head CT from earlier the same day. FINDINGS: Brain: Cerebral volume within normal limits for age. Patchy and confluent T2/FLAIR hyperintensity involving the periventricular deep white matter both cerebral hemispheres most consistent with chronic small vessel ischemic disease, moderate to advanced in nature. Multiple scattered remote lacunar infarcts present about the bilateral basal ganglia/hemispheric cerebral white matter. Chronic right MCA territory infarct noted at the high right frontal lobe. Additional chronic left cerebellar infarcts noted. Additional small areas of encephalomalacia and gliosis involving the anterior frontal poles favored to be related to remote  trauma. 12 mm acute ischemic infarcts seen involving the right pons (series 5, image 62). Additional scattered acute ischemic infarcts seen involving the cortical and subcortical aspect of the left frontal, parietal, and occipital regions, consistent with acute ischemic infarcts as well (series 5, image 84). These are watershed in distribution. No associated hemorrhage or significant mass effect about these areas of infarction. Otherwise, gray-white matter differentiation maintained. No areas of acute or chronic intracranial hemorrhage. No mass lesion, midline shift or mass effect. No hydrocephalus or extra-axial fluid collection. Pituitary gland suprasellar region normal. Midline structures intact. Vascular: Major intracranial vascular flow voids are maintained. Skull and upper cervical spine: Craniocervical junction within normal limits. Bone marrow signal intensity normal. No scalp soft tissue abnormality. Sinuses/Orbits: Globes and orbital soft tissues demonstrate no acute finding. Mild scattered mucosal thickening noted within the ethmoidal air cells. Paranasal sinuses are otherwise clear. Trace right mastoid effusion noted, of doubtful significance. Other: None. IMPRESSION: 1. 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 2. Additional scattered acute ischemic nonhemorrhagic infarcts involving the cortical and subcortical aspect of the left frontal, parietal, and occipital lobes. These are watershed in distribution. 3. Underlying moderate to advanced chronic microvascular ischemic disease with multiple remote infarcts involving the right frontal lobe, bilateral basal ganglia/hemispheric cerebral white matter, left cerebellum. 4. Additional small areas of encephalomalacia involving the anterior frontal lobes bilaterally, likely related to remote trauma. Electronically Signed   By: Rise Mu M.D.   On: 06/03/2021 20:09   ECHOCARDIOGRAM COMPLETE  Result Date: 06/04/2021     ECHOCARDIOGRAM REPORT   Patient Name:   Ryan Montgomery Date of Exam: 06/04/2021 Medical Rec #:  [id number]       Height:  71.0 in Accession #:    [id number]      Weight:       180.0 lb Date of Birth:  [birth year]        BSA:          2.016 m Patient Age:    66 years        BP:           133/68 mmHg Patient Gender: M               HR:           58 bpm. Exam Location:  Inpatient Procedure: 2D Echo, Cardiac Doppler and Color Doppler Indications:    Stroke  History:        Patient has prior history of Echocardiogram examinations.  Sonographer:    Jyl Heinz Referring Phys: [place]  1. Left ventricular ejection fraction, by estimation, is 55 to 60%. The left ventricle has normal function. The left ventricle has no regional wall motion abnormalities. There is mild concentric left ventricular hypertrophy. Left ventricular diastolic parameters were normal.  2. Right ventricular systolic function is normal. The right ventricular size is normal. There is normal pulmonary artery systolic pressure.  3. Left atrial size was mildly dilated.  4. The mitral valve is normal in structure. Trivial mitral valve regurgitation. No evidence of mitral stenosis.  5. The aortic valve is tricuspid. Aortic valve regurgitation is mild. No aortic stenosis is present.  6. Aortic dilatation noted. There is moderate dilatation of the aortic root, measuring 44 mm.  7. The inferior vena cava is normal in size with greater than 50% respiratory variability, suggesting right atrial pressure of 3 mmHg. Comparison(s): No significant change from prior study. Prior images reviewed side by side. FINDINGS  Left Ventricle: Left ventricular ejection fraction, by estimation, is 55 to 60%. The left ventricle has normal function. The left ventricle has no regional wall motion abnormalities. The left ventricular internal cavity size was normal in size. There is  mild concentric left ventricular hypertrophy. Left ventricular diastolic  parameters were normal. Normal left ventricular filling pressure. Right Ventricle: The right ventricular size is normal. No increase in right ventricular wall thickness. Right ventricular systolic function is normal. There is normal pulmonary artery systolic pressure. The tricuspid regurgitant velocity is 2.50 m/s, and  with an assumed right atrial pressure of 3 mmHg, the estimated right ventricular systolic pressure is [id number] mmHg. Left Atrium: Left atrial size was mildly dilated. Right Atrium: Right atrial size was normal in size. Pericardium: There is no evidence of pericardial effusion. Mitral Valve: The mitral valve is normal in structure. Trivial mitral valve regurgitation. No evidence of mitral valve stenosis. Tricuspid Valve: The tricuspid valve is normal in structure. Tricuspid valve regurgitation is trivial. No evidence of tricuspid stenosis. Aortic Valve: The aortic valve is tricuspid. Aortic valve regurgitation is mild. Aortic regurgitation PHT measures 605 msec. No aortic stenosis is present. Aortic valve peak gradient measures 3.6 mmHg. Pulmonic Valve: The pulmonic valve was grossly normal. Pulmonic valve regurgitation is mild. No evidence of pulmonic stenosis. Aorta: Aortic dilatation noted. There is moderate dilatation of the aortic root, measuring 44 mm. Venous: The inferior vena cava is normal in size with greater than 50% respiratory variability, suggesting right atrial pressure of 3 mmHg. IAS/Shunts: No atrial level shunt detected by color flow Doppler.  LEFT VENTRICLE PLAX 2D LVIDd:         4.50 cm      Diastology  LVIDs:         3.20 cm      LV e' medial:    6.74 cm/s LV PW:         1.20 cm      LV E/e' medial:  11.4 LV IVS:        1.20 cm      LV e' lateral:   9.03 cm/s LVOT diam:     2.30 cm      LV E/e' lateral: 8.5 LV SV:         84 LV SV Index:   42 LVOT Area:     4.15 cm  LV Volumes (MOD) LV vol d, MOD A2C: 123.0 ml LV vol d, MOD A4C: 151.0 ml LV vol s, MOD A2C: 53.0 ml LV vol s, MOD A4C:  63.7 ml LV SV MOD A2C:     70.0 ml LV SV MOD A4C:     151.0 ml LV SV MOD BP:      77.8 ml RIGHT VENTRICLE             IVC RV Basal diam:  3.80 cm     IVC diam: 2.00 cm RV Mid diam:    3.50 cm RV S prime:     10.30 cm/s TAPSE (M-mode): 2.1 cm LEFT ATRIUM             Index        RIGHT ATRIUM           Index LA diam:        3.80 cm 1.88 cm/m   RA Area:     12.30 cm LA Vol (A2C):   67.2 ml 33.33 ml/m  RA Volume:   20.60 ml  10.22 ml/m LA Vol (A4C):   64.2 ml 31.84 ml/m LA Biplane Vol: 66.1 ml 32.78 ml/m  AORTIC VALVE AV Area (Vmax): 3.81 cm AV Vmax:        94.70 cm/s AV Peak Grad:   3.6 mmHg LVOT Vmax:      86.80 cm/s LVOT Vmean:     62.100 cm/s LVOT VTI:       0.202 m AI PHT:         605 msec  AORTA Ao Root diam: 4.40 cm Ao Asc diam:  3.50 cm MITRAL VALVE               TRICUSPID VALVE MV Area (PHT): 5.02 cm    TR Peak grad:   25.0 mmHg MV Decel Time: 151 msec    TR Vmax:        250.00 cm/s MV E velocity: 77.10 cm/s MV A velocity: 56.60 cm/s  SHUNTS MV E/A ratio:  1.36        Systemic VTI:  0.20 m                            Systemic Diam: 2.30 cm Mihai Croitoru MD Electronically signed by Sanda Klein MD Signature Date/Time: 06/04/2021/12:56:21 PM    Final      PHYSICAL EXAM  Temp:  [97.7 F (36.5 C)-98.3 F (36.8 C)] 98 F (36.7 C) (01/11 1523) Pulse Rate:  [57-81] 81 (01/11 1523) Resp:  [14-17] 16 (01/11 1523) BP: (143-181)/(61-117) 168/98 (01/11 1523) SpO2:  [96 %-100 %] 99 % (01/11 1523) Weight:  [81.8 kg] 81.8 kg (01/10 2200)  General - Well nourished, well developed, in no apparent distress.  Ophthalmologic - fundi not visualized  due to noncooperation.  Cardiovascular - Regular rhythm and rate.  Mental Status -  Level of arousal and orientation to time, place, and person were intact. Language including expression, naming, repetition, comprehension was assessed and found intact. Fund of Knowledge was assessed and was intact.  Cranial Nerves II - XII - II - Visual field intact  OU. III, IV, VI - Extraocular movements intact. V - Facial sensation intact bilaterally. VII - Facial movement intact bilaterally. VIII - Hearing & vestibular intact bilaterally. X - Palate elevates symmetrically. XI - Chin turning & shoulder shrug intact bilaterally. XII - Tongue protrusion intact.  Motor Strength - The patients strength was normal in all extremities and pronator drift was absent except mild right hand grip and dexterity difficulty, however patient stated that was chronic.  Bulk was normal and fasciculations were absent.   Motor Tone - Muscle tone was assessed at the neck and appendages and was normal.  Reflexes - The patients reflexes were symmetrical in all extremities and he had no pathological reflexes.  Sensory - Light touch, temperature/pinprick were assessed and were symmetrical.    Coordination - The patient had normal movements in the hands and feet with no ataxia or dysmetria.  Tremor was absent.  Gait and Station - deferred.   ASSESSMENT/PLAN Ryan Montgomery is a 66 y.o. male with history of smoker, hepatitis C, stroke 3 to 4 years ago without residual admitted for slurred speech, fall at home and confusion. No tPA given due to outside window.    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 MRI right pontine and scattered acute ischemic nonhemorrhagic infarcts involving the cortical and subcortical aspect of the left frontal, parietal, and occipital lobes. These are watershed in distribution MRA head and neck unremarkable LE venous Doppler pending 2D Echo EF 55 to 60% LDL 73 HgbA1c 4.7 Hypercoiled work-up pending Recommended TEE and loop recorder.  May have to schedule TEE as outpatient Lovenox for VTE prophylaxis No antithrombotic prior to admission, now on aspirin 81 mg daily and clopidogrel 75 mg daily DAPT for 3 weeks and then  aspirin alone. Patient counseled to be compliant with his antithrombotic medications Ongoing aggressive stroke risk factor management Therapy recommendations: Home health OT Disposition: Pending  History of LOC 11/2019 LOC with possible jerking movement EEG negative Concern for seizure, put on Keppra 500 mg twice daily However Keppra was not on his current medication list No more similar episode since  History of heart palpitation Patient stated that 5 years ago, while he was at work, he had heart fluttering, heart palpitation for 30 minutes.  He did not seek for medical attention at that time Given currently embolic Stroke, recommend loop recorder to rule out afib  Hypertension stable Long term BP goal normotensive  Lipid management Home meds: None LDL 63, goal < 70 Now on Lipitor 20 No high intensity statin due to LDL at goal Continue statin at discharge  Tobacco abuse Current smoker Smoking cessation counseling provided Nicotine patch provided Pt is willing to quit  Other Stroke Risk Factors Hx stroke/TIA - 3-4 years ago, details not clear, no residual left  Other Active Problems AKI, creatinine 2.13-1.47-1.11 Mild leukocytosis, WBC 10.3  Hospital day # 2    Rosalin Hawking, MD PhD Stroke Neurology 06/05/2021 4:23 PM    To contact Stroke Continuity provider, please refer to [email or web address] After hours, contact General Neurology

## 2021-06-05 NOTE — Progress Notes (Signed)
PROGRESS NOTE    Xiong Coutee  [id number] DOB: 03-06-1956 DOA: 06/03/2021 PCP: Laurey Morale, MD   Chief Complain: Patient is a 66 year old male with history of tobacco abuse, prior stroke with no residual deficits, hepatitis C who presented to the emergency department with complaints of 1 week history of slurred speech, falls.  MRI on presentation showed acute to subacute nonhemorrhagic right pontine infarct, scattered nonhemorrhagic infarcts involving the cortical and subcortical aspect of the left frontoparietal and occipital lobe concerning for watershed distribution.  MRA of the head and neck did not show any significant stenosis.  Suspicion for cardioembolic etiology.  Neurology following.  Plan for TEE/loop recorder placement.  PT/OT evaluation pending.  Brief Narrative:   Assessment & Plan:   Principal Problem:   Acute CVA (cerebrovascular accident) ([place]) Active Problems:   AKI (acute kidney injury) ([place])   Tobacco use   Aortic dilatation ([place])   Dysphagia   Acute ischemic stroke: Presented with 1 week history of slurred speech, falls, weakness.MRI on presentation showed acute to subacute nonhemorrhagic right pontine infarct, scattered nonhemorrhagic infarcts involving the cortical and subcortical aspect of the left frontoparietal and occipital lobe concerning for watershed distribution.  MRA of the head and neck did not show any significant stenosis.  Suspicion for cardioembolic etiology.  Neurology following.  Plan for TEE/loop recorder placement.  PT/OT/speech evaluation pending. LDL of 63.  Hemoglobin A1c of 4.7.  Continue aspirin 325 mg and Plavix for now.  Started on Lipitor 80 mg daily. Venous Doppler has been pending to rule out DVT Echo showed EF of 55 to 60%, no regional wall motion, mild dilated left atrial size no intra-atrial shunt  Dysphagia: Secondary to stroke.  Speech therapist will see the patient.  Patient is hungry.  Bedside evaluation will be done,  probably can eat today  AKI: Improving  with IV fluids  Aortic dilation: Seen on echocardiogram.  Recommend outpatient follow-up with cardiology.  Tobacco use: Continue nicotine patch.  Counseled for cessation  History of hepatitis C :status post treatment         DVT prophylaxis:Lovenox Code Status: Full Family Communication: None at the bedside Patient status:Inpatient  Dispo: The patient is from: Home              Anticipated d/c is to: Home vs SNF              Anticipated d/c date is: Work-up, therapist evaluation not completed yet   Procedures: None  Antimicrobials:  Anti-infectives (From admission, onward)    None       Subjective:  Patient seen and examined at the bedside this morning.  Hemodynamically stable.  Comfortable, lying on bed.  Has mild weakness on the right upper extremity, speech is almost clear but he still has some dysarthria.  Denies any new complaints except for hunger   Objective: Vitals:   06/04/21 2200 06/04/21 2317 06/05/21 0340 06/05/21 0717  BP:  (!) 156/117 (!) 151/83 (!) 156/82  Pulse:  68 (!) 57 62  Resp:  17 17 16   Temp:  97.8 F (36.6 C) 97.8 F (36.6 C) 97.9 F (36.6 C)  TempSrc:  Oral Oral Oral  SpO2:  100% 100% 99%  Weight: 81.8 kg     Height: 5\' 11"  (1.803 m)       Intake/Output Summary (Last 24 hours) at 06/05/2021 0817 Last data filed at 06/04/2021 2031 Gross per 24 hour  Intake 2224.59 ml  Output --  Net 2224.59 ml  Filed Weights   06/03/21 1504 06/04/21 2200  Weight: 81.6 kg 81.8 kg    Examination:  General exam: Overall comfortable, not in distress HEENT: PERRL Respiratory system:  no wheezes or crackles  Cardiovascular system: S1 & S2 heard, RRR.  Gastrointestinal system: Abdomen is nondistended, soft and nontender. Central nervous system: Alert and oriented. Weakness of right upper extremity with motor of 4+/5. Mild dysarthria Extremities: No edema, no clubbing ,no cyanosis Skin: No rashes, no  ulcers,no icterus      Data Reviewed: I have personally reviewed following labs and imaging studies  CBC: Recent Labs  Lab 06/03/21 1633  WBC 10.3  NEUTROABS 4.6  HGB 14.2  HCT 43.4  MCV 100.5*  PLT [id number]   Basic Metabolic Panel: Recent Labs  Lab 06/03/21 1633 06/04/21 0418  NA 141 139  K 4.0 3.7  CL 109 108  CO2 26 25  GLUCOSE 107* 106*  BUN 19 16  CREATININE 2.13* 1.47*  CALCIUM 9.0 8.4*   GFR: Estimated Creatinine Clearance: 54.1 mL/min (A) (by C-G formula based on SCr of 1.47 mg/dL (H)). Liver Function Tests: Recent Labs  Lab 06/03/21 1842  AST 28  ALT 22  ALKPHOS 65  BILITOT 0.8  PROT 8.3*  ALBUMIN 4.1   No results for input(s): LIPASE, AMYLASE in the last 168 hours. No results for input(s): AMMONIA in the last 168 hours. Coagulation Profile: No results for input(s): INR, PROTIME in the last 168 hours. Cardiac Enzymes: No results for input(s): CKTOTAL, CKMB, CKMBINDEX, TROPONINI in the last 168 hours. BNP (last 3 results) No results for input(s): PROBNP in the last 8760 hours. HbA1C: Recent Labs    06/05/21 0603  HGBA1C 4.7*   CBG: No results for input(s): GLUCAP in the last 168 hours. Lipid Profile: Recent Labs    06/05/21 0328  CHOL 117  HDL 33*  LDLCALC 63  TRIG 104  CHOLHDL 3.5   Thyroid Function Tests: Recent Labs    06/05/21 0603  TSH 2.277   Anemia Panel: Recent Labs    06/05/21 0603  VITAMINB12 493   Sepsis Labs: No results for input(s): PROCALCITON, LATICACIDVEN in the last 168 hours.  Recent Results (from the past 240 hour(s))  Resp Panel by RT-PCR (Flu A&B, Covid) Nasopharyngeal Swab     Status: None   Collection Time: 06/03/21  6:42 PM   Specimen: Nasopharyngeal Swab; Nasopharyngeal(NP) swabs in vial transport medium  Result Value Ref Range Status   SARS Coronavirus 2 by RT PCR NEGATIVE NEGATIVE Final    Comment: (NOTE) SARS-CoV-2 target nucleic acids are NOT DETECTED.  The SARS-CoV-2 RNA is generally  detectable in upper respiratory specimens during the acute phase of infection. The lowest concentration of SARS-CoV-2 viral copies this assay can detect is 138 copies/mL. A negative result does not preclude SARS-Cov-2 infection and should not be used as the sole basis for treatment or other patient management decisions. A negative result may occur with  improper specimen collection/handling, submission of specimen other than nasopharyngeal swab, presence of viral mutation(s) within the areas targeted by this assay, and inadequate number of viral copies(<138 copies/mL). A negative result must be combined with clinical observations, patient history, and epidemiological information. The expected result is Negative.  Fact Sheet for Patients:  EntrepreneurPulse.com.au  Fact Sheet for Healthcare Providers:  IncredibleEmployment.be  This test is no t yet approved or cleared by the [place] FDA and  has been authorized for detection and/or diagnosis of SARS-CoV-2 by FDA under an  Emergency Use Authorization (EUA). This EUA will remain  in effect (meaning this test can be used) for the duration of the COVID-19 declaration under Section 564(b)(1) of the Act, 21 U.S.C.section 360bbb-3(b)(1), unless the authorization is terminated  or revoked sooner.       Influenza A by PCR NEGATIVE NEGATIVE Final   Influenza B by PCR NEGATIVE NEGATIVE Final    Comment: (NOTE) The Xpert Xpress SARS-CoV-2/FLU/RSV plus assay is intended as an aid in the diagnosis of influenza from Nasopharyngeal swab specimens and should not be used as a sole basis for treatment. Nasal washings and aspirates are unacceptable for Xpert Xpress SARS-CoV-2/FLU/RSV testing.  Fact Sheet for Patients: EntrepreneurPulse.com.au  Fact Sheet for Healthcare Providers: IncredibleEmployment.be  This test is not yet approved or cleared by the [place] FDA  and has been authorized for detection and/or diagnosis of SARS-CoV-2 by FDA under an Emergency Use Authorization (EUA). This EUA will remain in effect (meaning this test can be used) for the duration of the COVID-19 declaration under Section 564(b)(1) of the Act, 21 U.S.C. section 360bbb-3(b)(1), unless the authorization is terminated or revoked.  Performed at [facility], [place] [address], [place], [place] [address]          Radiology Studies: DG Chest 2 View  Result Date: 06/03/2021 CLINICAL DATA:  Intermittent slurred speech with frequent falls and confusion EXAM: CHEST - 2 VIEW COMPARISON:  Chest x-ray 12/28/2020, CT 09/09/2020 FINDINGS: The heart size and mediastinal contours are within normal limits. Both lungs are clear. Severe chronic compression fracture at T12. IMPRESSION: No active cardiopulmonary disease. Electronically Signed   By: Donavan Foil M.D.   On: 06/03/2021 15:49   CT Head Wo Contrast  Result Date: 06/03/2021 CLINICAL DATA:  Mental status change. EXAM: CT HEAD WITHOUT CONTRAST TECHNIQUE: Contiguous axial images were obtained from the base of the skull through the vertex without intravenous contrast. COMPARISON:  11/27/2018 FINDINGS: Brain: No acute intracranial hemorrhage. No focal mass lesion. No CT evidence of acute infarction. No midline shift or mass effect. No hydrocephalus. Basilar cisterns are patent. Remote small RIGHT frontal cortical and subcortical infarction. Extensive periventricular and subcortical white matter hypodensities. Generalized cortical atrophy. Vascular: No hyperdense vessel or unexpected calcification. Skull: Normal. Negative for fracture or focal lesion. Sinuses/Orbits: Paranasal sinuses and mastoid air cells are clear. Orbits are clear. Other: None. IMPRESSION: 1. No acute intracranial findings. 2. 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 3. Remote RIGHT frontal infarction. Electronically Signed   By:  Suzy Bouchard M.D.   On: 06/03/2021 16:34   MR ANGIO HEAD WO CONTRAST  Result Date: 06/04/2021 CLINICAL DATA:  Initial evaluation for acute stroke. EXAM: MRA NECK WITHOUT AND WITH CONTRAST MRA HEAD WITHOUT CONTRAST TECHNIQUE: Multiplanar and multiecho pulse sequences of the neck were obtained without and with intravenous contrast. Angiographic images of the neck were obtained using MRA technique without and with intravenous contrast; Angiographic images of the Circle of Willis were obtained using MRA technique without intravenous contrast. CONTRAST:  9 cc of Gadavist. COMPARISON:  Previous MRI from earlier the same day. FINDINGS: MRA NECK FINDINGS AORTIC ARCH: Partially visualized aortic arch normal in caliber with normal branch pattern. No stenosis seen about the origin of the great vessels. RIGHT CAROTID SYSTEM: Right common and internal carotid arteries patent without stenosis or dissection. No significant atheromatous narrowing about the right carotid bulb. LEFT CAROTID SYSTEM: Left common and internal carotid arteries patent without stenosis or dissection. No significant atheromatous  narrowing about the left carotid wall. VERTEBRAL ARTERIES: Both vertebral arteries arise from the subclavian arteries. Right vertebral artery slightly dominant. No visible proximal subclavian artery stenosis. Vertebral arteries patent without stenosis or evidence for dissection. MRA HEAD FINDINGS ANTERIOR CIRCULATION: Both internal carotid arteries widely patent to the termini without stenosis or other abnormality. A1 segments widely patent. Normal anterior communicating artery complex. Anterior cerebral arteries widely patent without stenosis. No M1 stenosis or occlusion. Normal MCA bifurcations. Distal MCA branches well perfused and symmetric. POSTERIOR CIRCULATION: Visualized V4 segments widely patent bilaterally. Right vertebral artery dominant. Partially visualized PICA patent bilaterally. Basilar patent to its distal  aspect without stenosis. Superior cerebellar are and posterior cerebral arteries widely patent bilaterally. Small bilateral posterior communicating arteries noted. Anatomic variants: None significant.  No aneurysm. IMPRESSION: Negative MRA of the head and neck. No large vessel occlusion. No hemodynamically significant or correctable stenosis. Electronically Signed   By: Jeannine Boga M.D.   On: 06/04/2021 00:30   MR Angiogram Neck W or Wo Contrast  Result Date: 06/04/2021 CLINICAL DATA:  Initial evaluation for acute stroke. EXAM: MRA NECK WITHOUT AND WITH CONTRAST MRA HEAD WITHOUT CONTRAST TECHNIQUE: Multiplanar and multiecho pulse sequences of the neck were obtained without and with intravenous contrast. Angiographic images of the neck were obtained using MRA technique without and with intravenous contrast; Angiographic images of the Circle of Willis were obtained using MRA technique without intravenous contrast. CONTRAST:  9 cc of Gadavist. COMPARISON:  Previous MRI from earlier the same day. FINDINGS: MRA NECK FINDINGS AORTIC ARCH: Partially visualized aortic arch normal in caliber with normal branch pattern. No stenosis seen about the origin of the great vessels. RIGHT CAROTID SYSTEM: Right common and internal carotid arteries patent without stenosis or dissection. No significant atheromatous narrowing about the right carotid bulb. LEFT CAROTID SYSTEM: Left common and internal carotid arteries patent without stenosis or dissection. No significant atheromatous narrowing about the left carotid wall. VERTEBRAL ARTERIES: Both vertebral arteries arise from the subclavian arteries. Right vertebral artery slightly dominant. No visible proximal subclavian artery stenosis. Vertebral arteries patent without stenosis or evidence for dissection. MRA HEAD FINDINGS ANTERIOR CIRCULATION: Both internal carotid arteries widely patent to the termini without stenosis or other abnormality. A1 segments widely patent.  Normal anterior communicating artery complex. Anterior cerebral arteries widely patent without stenosis. No M1 stenosis or occlusion. Normal MCA bifurcations. Distal MCA branches well perfused and symmetric. POSTERIOR CIRCULATION: Visualized V4 segments widely patent bilaterally. Right vertebral artery dominant. Partially visualized PICA patent bilaterally. Basilar patent to its distal aspect without stenosis. Superior cerebellar are and posterior cerebral arteries widely patent bilaterally. Small bilateral posterior communicating arteries noted. Anatomic variants: None significant.  No aneurysm. IMPRESSION: Negative MRA of the head and neck. No large vessel occlusion. No hemodynamically significant or correctable stenosis. Electronically Signed   By: Jeannine Boga M.D.   On: 06/04/2021 00:30   MR Brain Wo Contrast (neuro protocol)  Result Date: 06/03/2021 CLINICAL DATA:  Initial evaluation for neuro deficit, stroke suspected./ EXAM: MRI HEAD WITHOUT CONTRAST TECHNIQUE: Multiplanar, multiecho pulse sequences of the brain and surrounding structures were obtained without intravenous contrast. COMPARISON:  Head CT from earlier the same day. FINDINGS: Brain: Cerebral volume within normal limits for age. Patchy and confluent T2/FLAIR hyperintensity involving the periventricular deep white matter both cerebral hemispheres most consistent with chronic small vessel ischemic disease, moderate to advanced in nature. Multiple scattered remote lacunar infarcts present about the bilateral basal ganglia/hemispheric cerebral white matter. Chronic right MCA territory  infarct noted at the high right frontal lobe. Additional chronic left cerebellar infarcts noted. Additional small areas of encephalomalacia and gliosis involving the anterior frontal poles favored to be related to remote trauma. 12 mm acute ischemic infarcts seen involving the right pons (series 5, image 62). Additional scattered acute ischemic infarcts seen  involving the cortical and subcortical aspect of the left frontal, parietal, and occipital regions, consistent with acute ischemic infarcts as well (series 5, image 84). These are watershed in distribution. No associated hemorrhage or significant mass effect about these areas of infarction. Otherwise, gray-white matter differentiation maintained. No areas of acute or chronic intracranial hemorrhage. No mass lesion, midline shift or mass effect. No hydrocephalus or extra-axial fluid collection. Pituitary gland suprasellar region normal. Midline structures intact. Vascular: Major intracranial vascular flow voids are maintained. Skull and upper cervical spine: Craniocervical junction within normal limits. Bone marrow signal intensity normal. No scalp soft tissue abnormality. Sinuses/Orbits: Globes and orbital soft tissues demonstrate no acute finding. Mild scattered mucosal thickening noted within the ethmoidal air cells. Paranasal sinuses are otherwise clear. Trace right mastoid effusion noted, of doubtful significance. Other: None. IMPRESSION: 1. 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 2. Additional scattered acute ischemic nonhemorrhagic infarcts involving the cortical and subcortical aspect of the left frontal, parietal, and occipital lobes. These are watershed in distribution. 3. Underlying moderate to advanced chronic microvascular ischemic disease with multiple remote infarcts involving the right frontal lobe, bilateral basal ganglia/hemispheric cerebral white matter, left cerebellum. 4. Additional small areas of encephalomalacia involving the anterior frontal lobes bilaterally, likely related to remote trauma. Electronically Signed   By: Rise MuBenjamin  McClintock M.D.   On: 06/03/2021 20:09   ECHOCARDIOGRAM COMPLETE  Result Date: 06/04/2021    ECHOCARDIOGRAM REPORT   Patient Name:   Tereso NewcomerMAURICE Yohannes Date of Exam: 06/04/2021 Medical Rec #:  [id number]       Height:       71.0 in Accession #:     [id number]      Weight:       180.0 lb Date of Birth:  08/21/1955        BSA:          2.016 m Patient Age:    65 years        BP:           133/68 mmHg Patient Gender: M               HR:           58 bpm. Exam Location:  Inpatient Procedure: 2D Echo, Cardiac Doppler and Color Doppler Indications:    Stroke  History:        Patient has prior history of Echocardiogram examinations.  Sonographer:    Cleatis Polkaaylor Peper Referring Phys: [phone] DANIEL P GOODRICH IMPRESSIONS  1. Left ventricular ejection fraction, by estimation, is 55 to 60%. The left ventricle has normal function. The left ventricle has no regional wall motion abnormalities. There is mild concentric left ventricular hypertrophy. Left ventricular diastolic parameters were normal.  2. Right ventricular systolic function is normal. The right ventricular size is normal. There is normal pulmonary artery systolic pressure.  3. Left atrial size was mildly dilated.  4. The mitral valve is normal in structure. Trivial mitral valve regurgitation. No evidence of mitral stenosis.  5. The aortic valve is tricuspid. Aortic valve regurgitation is mild. No aortic stenosis is present.  6. Aortic dilatation noted. There is moderate dilatation of the aortic root, measuring 44  mm.  7. The inferior vena cava is normal in size with greater than 50% respiratory variability, suggesting right atrial pressure of 3 mmHg. Comparison(s): No significant change from prior study. Prior images reviewed side by side. FINDINGS  Left Ventricle: Left ventricular ejection fraction, by estimation, is 55 to 60%. The left ventricle has normal function. The left ventricle has no regional wall motion abnormalities. The left ventricular internal cavity size was normal in size. There is  mild concentric left ventricular hypertrophy. Left ventricular diastolic parameters were normal. Normal left ventricular filling pressure. Right Ventricle: The right ventricular size is normal. No increase in right  ventricular wall thickness. Right ventricular systolic function is normal. There is normal pulmonary artery systolic pressure. The tricuspid regurgitant velocity is 2.50 m/s, and  with an assumed right atrial pressure of 3 mmHg, the estimated right ventricular systolic pressure is [id number] mmHg. Left Atrium: Left atrial size was mildly dilated. Right Atrium: Right atrial size was normal in size. Pericardium: There is no evidence of pericardial effusion. Mitral Valve: The mitral valve is normal in structure. Trivial mitral valve regurgitation. No evidence of mitral valve stenosis. Tricuspid Valve: The tricuspid valve is normal in structure. Tricuspid valve regurgitation is trivial. No evidence of tricuspid stenosis. Aortic Valve: The aortic valve is tricuspid. Aortic valve regurgitation is mild. Aortic regurgitation PHT measures 605 msec. No aortic stenosis is present. Aortic valve peak gradient measures 3.6 mmHg. Pulmonic Valve: The pulmonic valve was grossly normal. Pulmonic valve regurgitation is mild. No evidence of pulmonic stenosis. Aorta: Aortic dilatation noted. There is moderate dilatation of the aortic root, measuring 44 mm. Venous: The inferior vena cava is normal in size with greater than 50% respiratory variability, suggesting right atrial pressure of 3 mmHg. IAS/Shunts: No atrial level shunt detected by color flow Doppler.  LEFT VENTRICLE PLAX 2D LVIDd:         4.50 cm      Diastology LVIDs:         3.20 cm      LV e' medial:    6.74 cm/s LV PW:         1.20 cm      LV E/e' medial:  11.4 LV IVS:        1.20 cm      LV e' lateral:   9.03 cm/s LVOT diam:     2.30 cm      LV E/e' lateral: 8.5 LV SV:         84 LV SV Index:   42 LVOT Area:     4.15 cm  LV Volumes (MOD) LV vol d, MOD A2C: 123.0 ml LV vol d, MOD A4C: 151.0 ml LV vol s, MOD A2C: 53.0 ml LV vol s, MOD A4C: 63.7 ml LV SV MOD A2C:     70.0 ml LV SV MOD A4C:     151.0 ml LV SV MOD BP:      77.8 ml RIGHT VENTRICLE             IVC RV Basal diam:  3.80  cm     IVC diam: 2.00 cm RV Mid diam:    3.50 cm RV S prime:     10.30 cm/s TAPSE (M-mode): 2.1 cm LEFT ATRIUM             Index        RIGHT ATRIUM           Index LA diam:  3.80 cm 1.88 cm/m   RA Area:     12.30 cm LA Vol (A2C):   67.2 ml 33.33 ml/m  RA Volume:   20.60 ml  10.22 ml/m LA Vol (A4C):   64.2 ml 31.84 ml/m LA Biplane Vol: 66.1 ml 32.78 ml/m  AORTIC VALVE AV Area (Vmax): 3.81 cm AV Vmax:        94.70 cm/s AV Peak Grad:   3.6 mmHg LVOT Vmax:      86.80 cm/s LVOT Vmean:     62.100 cm/s LVOT VTI:       0.202 m AI PHT:         605 msec  AORTA Ao Root diam: 4.40 cm Ao Asc diam:  3.50 cm MITRAL VALVE               TRICUSPID VALVE MV Area (PHT): 5.02 cm    TR Peak grad:   25.0 mmHg MV Decel Time: 151 msec    TR Vmax:        250.00 cm/s MV E velocity: 77.10 cm/s MV A velocity: 56.60 cm/s  SHUNTS MV E/A ratio:  1.36        Systemic VTI:  0.20 m                            Systemic Diam: 2.30 cm Mihai Croitoru MD Electronically signed by Sanda Klein MD Signature Date/Time: 06/04/2021/12:56:21 PM    Final         Scheduled Meds:   stroke: mapping our early stages of recovery book   Does not apply Once   aspirin  325 mg Oral Daily   clopidogrel  75 mg Oral Daily   enoxaparin (LOVENOX) injection  40 mg Subcutaneous Q24H   nicotine  14 mg Transdermal Daily   Continuous Infusions:   LOS: 2 days    Time spent: 35 mins.More than 50% of that time was spent in counseling and/or coordination of care.      Shelly Coss, MD [facility] P1/03/2022, 8:17 AM

## 2021-06-05 NOTE — Evaluation (Signed)
Physical Therapy Evaluation Patient Details Name: Ryan Montgomery MRN: [id number] DOB: Aug 18, 1955 Today's Date: 06/05/2021  History of Present Illness  Ryan Montgomery is a 66 y.o. male presenting with syncopal episode with jerking motions, urinary incontinence and LOC. THC in UDS. PMH is significant for treated hepatitis C and gout, h/o stroke, h/o alcohol abuse, L hip sx in 08/2019.   Clinical Impression  Pt admitted with above. Pt with noted R hand weakness and fine motor deficits limiting ability to perform ADLs. Pt unable to don socks due to inability to grasp with R hand and dropped the portable urinal several times in trying to use it. Pt with noted expressive difficulty and questionable cognition. Pt with inconsistent report of home set up and support compared to OT eval. Pt also with 2 episodes of urinary incontinence in which he was unable to manage and reports that he doesn't make it to the bathroom and "goes through a lot of jeans". Questioning pt's ability to care for self holistically at this time as he demos decreased insight to deficits and safety. Pt waited 7 days prior to coming to hospital when he wasn't speaking right and hasn't talked to MD about his urinary urgency and incontinence which has been going on for a year. Pt to benefit from SNF upon d/c to address cognitive and functional deficits to progress towards independence. Acute PT to cont to follow.     Recommendations for follow up therapy are one component of a multi-disciplinary discharge planning process, led by the attending physician.  Recommendations may be updated based on patient status, additional functional criteria and insurance authorization.  Follow Up Recommendations Skilled nursing-short term rehab (<3 hours/day)    Assistance Recommended at Discharge Frequent or constant Supervision/Assistance  Patient can return home with the following  A little help with walking and/or transfers;Assistance with  cooking/housework;Direct supervision/assist for medications management;Direct supervision/assist for financial management;Assist for transportation;Help with stairs or ramp for entrance;A lot of help with bathing/dressing/bathroom    Equipment Recommendations Rolling walker (2 wheels) (pt states he has one but unsure of accuracy)  Recommendations for Other Services       Functional Status Assessment Patient has had a recent decline in their functional status and/or demonstrates limited ability to make significant improvements in function in a reasonable and predictable amount of time     Precautions / Restrictions Precautions Precautions: Fall Restrictions Weight Bearing Restrictions: No      Mobility  Bed Mobility Overal bed mobility: Modified Independent             General bed mobility comments: HOB elevated, used momentum to pull self up;, verbal cues to scoot to edge of bed to place both feet on the floor    Transfers Overall transfer level: Needs assistance Equipment used: None Transfers: Sit to/from Stand Sit to Stand: Min guard           General transfer comment: wide base of support, increased time    Ambulation/Gait Ambulation/Gait assistance: Min guard;Min assist Gait Distance (Feet): 200 Feet Assistive device: Rolling walker (2 wheels);None Gait Pattern/deviations: Step-through pattern;Decreased stride length;Antalgic;Decreased stance time - right Gait velocity: dec Gait velocity interpretation: 1.31 - 2.62 ft/sec, indicative of limited community ambulator   General Gait Details: pt began amb without AD, pt with noted R LE antalgia and LOB when turning both to L and R requiring minA. When pt given RW pt with improved stability, fluidity of gait pattern and equal step height and length, pt reports using  cane at home  Stairs            Wheelchair Mobility    Modified Rankin (Stroke Patients Only) Modified Rankin (Stroke Patients  Only) Pre-Morbid Rankin Score: Slight disability Modified Rankin: Moderately severe disability     Balance Overall balance assessment: Needs assistance Sitting-balance support: Feet supported Sitting balance-Leahy Scale: Fair Sitting balance - Comments: pt with difficulty donning socks at EOB   Standing balance support: During functional activity;No upper extremity supported Standing balance-Leahy Scale: Fair                               Pertinent Vitals/Pain Pain Assessment: No/denies pain Faces Pain Scale: No hurt    Home Living Family/patient expects to be discharged to:: Private residence Living Arrangements: Alone Available Help at Discharge: Family;Available PRN/intermittently Type of Home: Apartment Home Access: Stairs to enter   Entrance Stairs-Number of Steps: 3   Home Layout: One level Home Equipment: Cane - single Barista (2 wheels) Additional Comments: pt lives alone but reports he can stay with his wife at her house if needed, however when wife called pt said they were unable to assist pt at all    Prior Function Prior Level of Function : Independent/Modified Independent;History of Falls (last six months)             Mobility Comments: has been using a SPC/walking stick recently, told PT he hasn't fallen however per OT note he admitted to having 2 falls in the last 3 months ADLs Comments: indep, does not drive, brother in law takes him to the grocery store     Hand Dominance   Dominant Hand: Right    Extremity/Trunk Assessment   Upper Extremity Assessment Upper Extremity Assessment: LUE deficits/detail;RUE deficits/detail RUE Deficits / Details: noted fine motor deficits, in ability to grip sock and don it, dropped sock several times and urinal, reports "this hand has been deteriating for years" RUE Coordination: decreased fine motor;decreased gross motor LUE Deficits / Details: grossly 4-/5    Lower Extremity  Assessment Lower Extremity Assessment: Overall WFL for tasks assessed (R hip slightly weak but did have surgery on it a few years ago)    Cervical / Trunk Assessment Cervical / Trunk Assessment: Normal  Communication   Communication: Expressive difficulties  Cognition Arousal/Alertness: Awake/alert Behavior During Therapy: WFL for tasks assessed/performed Overall Cognitive Status: No family/caregiver present to determine baseline cognitive functioning                                 General Comments: no family present. pt with delayed response time but also with expressive difficulties. Pt able to follow all commands. Pt with inconsistent report of home set up and support compared to OT eval. Pt also with 2 episodes of urinary urgency and incontinence whom he reports has been going on for awhile, when asked if he talked to his MD about it he said "no, I guess I should." Reports he often doesnt make it to the bathroom in time and they he goes through a lot of jeans        General Comments General comments (skin integrity, edema, etc.): VSS on RA, pt with 2 episodes of urinary urgency requiring modA for urinal management, pt dropped urinal with R hand and couldn't get penis in urinal and peed all over the floor    Exercises  Assessment/Plan    PT Assessment Patient needs continued PT services  PT Problem List Decreased strength;Decreased range of motion;Decreased activity tolerance;Decreased balance;Decreased mobility;Decreased coordination;Decreased cognition;Decreased knowledge of use of DME;Decreased safety awareness       PT Treatment Interventions DME instruction;Gait training;Functional mobility training;Therapeutic activities;Stair training;Therapeutic exercise;Balance training;Neuromuscular re-education    PT Goals (Current goals can be found in the Care Plan section)  Acute Rehab PT Goals Patient Stated Goal: home PT Goal Formulation: With patient Time For  Goal Achievement: 06/19/21 Potential to Achieve Goals: Good    Frequency Min 4X/week     Co-evaluation               AM-PAC PT "6 Clicks" Mobility  Outcome Measure Help needed turning from your back to your side while in a flat bed without using bedrails?: A Little Help needed moving from lying on your back to sitting on the side of a flat bed without using bedrails?: A Little Help needed moving to and from a bed to a chair (including a wheelchair)?: A Little Help needed standing up from a chair using your arms (e.g., wheelchair or bedside chair)?: A Lot Help needed to walk in hospital room?: A Lot Help needed climbing 3-5 steps with a railing? : A Lot 6 Click Score: 15    End of Session Equipment Utilized During Treatment: Gait belt Activity Tolerance: Patient tolerated treatment well Patient left: in bed;with call bell/phone within reach;with bed alarm set Nurse Communication: Mobility status;Other (comment) (urinary urgency and incontinence) PT Visit Diagnosis: Unsteadiness on feet (R26.81);History of falling (Z91.81);Difficulty in walking, not elsewhere classified (R26.2)    Time: [id number] PT Time Calculation (min) (ACUTE ONLY): 30 min   Charges:   PT Evaluation $PT Eval Moderate Complexity: 1 Mod PT Treatments $Gait Training: 8-22 mins        Kittie Plater, PT, DPT Acute Rehabilitation Services Pager #: [phone] Office #: [phone]   Berline Lopes 06/05/2021, 3:31 PM
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
## 2021-06-05 NOTE — Evaluation (Addendum)
Occupational Therapy Evaluation Patient Details Name: Ryan Montgomery MRN: [id number] DOB: 1956/01/03 Today's Date: 06/05/2021   History of Present Illness Ryan Montgomery is a 66 y.o. male presenting with syncopal episode with jerking motions, urinary incontinence and LOC. THC in UDS. PMH is significant for treated hepatitis C and gout, h/o stroke, h/o alcohol abuse, L hip sx in 08/2019.   Clinical Impression   Ryan Montgomery reports being generally mod I PTA, he does not drive, uses a SPC for mobility, and goes to the [facility] for exercise 3x/wk. He lives alone in an entry level apartment, he has 2 daughters near by, and a wife (they are currently separated) who can assist as needed. Upon evaluation pt was mod I for bed mobility and supervision level for transfers. Due to some RLE baseline weakness, pt benefited from  RW for functional room ambulation and min G fro safety. He currently required supervision-min G for ADLs. Visual assessment was [facility], and he denies any apparent changes. He will benefit from OT acutely to progress towards his mod I baseline. Recommend d/c home with home health to continue to progress within the home environment.  Addendum: Per wife, pt does not have family support and is unable to go home safely without some assistance. Recommend pt d/c to SNF to progress rehab and determine long term placement planning.    Recommendations for follow up therapy are one component of a multi-disciplinary discharge planning process, led by the attending physician.  Recommendations may be updated based on patient status, additional functional criteria and insurance authorization.   Follow Up Recommendations  Skilled nursing-short term rehab (<3 hours/day)   Assistance Recommended at Discharge Intermittent Supervision/Assistance  Patient can return home with the following Assist for transportation;Help with stairs or ramp for entrance;Assistance with cooking/housework    Functional Status  Assessment  Patient has had a recent decline in their functional status and demonstrates the ability to make significant improvements in function in a reasonable and predictable amount of time.  Equipment Recommendations  BSC/3in1 (RW)    Recommendations for Other Services       Precautions / Restrictions Precautions Precautions: Fall Restrictions Weight Bearing Restrictions: No      Mobility Bed Mobility Overal bed mobility: Modified Independent                  Transfers Overall transfer level: Needs assistance Equipment used: None Transfers: Sit to/from Stand Sit to Stand: Supervision           General transfer comment: steady when standing, benefits from RW with gait      Balance Overall balance assessment: Needs assistance Sitting-balance support: Feet supported Sitting balance-Leahy Scale: Good     Standing balance support: During functional activity;No upper extremity supported Standing balance-Leahy Scale: Fair                             ADL either performed or assessed with clinical judgement   ADL Overall ADL's : Needs assistance/impaired Eating/Feeding: Independent;Sitting   Grooming: Supervision/safety;Standing   Upper Body Bathing: Set up;Sitting   Lower Body Bathing: Min guard;Sit to/from stand   Upper Body Dressing : Set up;Sitting   Lower Body Dressing: Min guard   Toilet Transfer: Min guard;Ambulation;Rolling walker (2 wheels)   Toileting- Clothing Manipulation and Hygiene: Supervision/safety;Sitting/lateral lean       Functional mobility during ADLs: Min guard;Rolling walker (2 wheels) General ADL Comments: no physical assist required for ADLs. close min  guard for OOB tasks for safety only. RW to incrase stability. Pt limps on R leg due to hip replacement/pelvis fracture 2 year ago.     Vision Baseline Vision/History: 0 No visual deficits Ability to See in Adequate Light: 0 Adequate Patient Visual Report: No  change from baseline Vision Assessment?: No apparent visual deficits            Pertinent Vitals/Pain Pain Assessment: No/denies pain Faces Pain Scale: No hurt     Hand Dominance Right   Extremity/Trunk Assessment Upper Extremity Assessment Upper Extremity Assessment: LUE deficits/detail LUE Deficits / Details: shoulder weakness at baseline due to rotator cuff injury 4 years ago. ROM is WFL LUE Sensation: WNL LUE Coordination: WNL   Lower Extremity Assessment Lower Extremity Assessment: Defer to PT evaluation   Cervical / Trunk Assessment Cervical / Trunk Assessment: Normal   Communication Communication Communication: Expressive difficulties   Cognition Arousal/Alertness: Awake/alert Behavior During Therapy: WFL for tasks assessed/performed Overall Cognitive Status: No family/caregiver present to determine baseline cognitive functioning             General Comments: Pt is O&Ax4. follows all commands approrpiately. he has mildy impaired insight to safety and deficits     General Comments  VSS on RA, no family present            [place] expects to be discharged to:: Private residence Living Arrangements: Alone Available Help at Discharge: Family;Available PRN/intermittently Type of Home: Apartment Home Access: Stairs to enter Entrance Stairs-Number of Steps: 3   Home Layout: One level     Bathroom Shower/Tub: Teacher, early years/pre: Handicapped height     Home Equipment: Cane - single point   Additional Comments: pt states he is currently seperated from his wife, but she can come assist if needed. He also has 2 daughters near by.  Lives With: Alone    Prior Functioning/Environment Prior Level of Function : Independent/Modified Independent;History of Falls (last six months)             Mobility Comments: has been using a SPC/walking stick recently. goes to [facility] 3x/wk. admits to 2 falls in the past 3 months ADLs  Comments: indep, does not drive        OT Problem List: Decreased strength;Decreased range of motion;Impaired balance (sitting and/or standing);Decreased activity tolerance;Decreased safety awareness;Decreased knowledge of use of DME or AE;Decreased knowledge of precautions      OT Treatment/Interventions: Self-care/ADL training;Therapeutic exercise;DME and/or AE instruction;Patient/family education;Balance training;Therapeutic activities    OT Goals(Current goals can be found in the care plan section) Acute Rehab OT Goals Patient Stated Goal: home soon OT Goal Formulation: With patient Time For Goal Achievement: 06/19/21 Potential to Achieve Goals: Good ADL Goals Pt Will Perform Grooming: Independently;standing Pt Will Transfer to Toilet: with modified independence;ambulating Pt Will Perform Tub/Shower Transfer: with modified independence;ambulating Pt/caregiver will Perform Home Exercise Program: Increased strength;Both right and left upper extremity;With written HEP provided Additional ADL Goal #1: Pt will indep verbalize at least 3 fall prevention strategies to apply in the home setting  OT Frequency: Min 2X/week       AM-PAC OT "6 Clicks" Daily Activity     Outcome Measure Help from another person eating meals?: None Help from another person taking care of personal grooming?: A Little Help from another person toileting, which includes using toliet, bedpan, or urinal?: A Little Help from another person bathing (including washing, rinsing, drying)?: A Little Help from another person to put  on and taking off regular upper body clothing?: None Help from another person to put on and taking off regular lower body clothing?: A Little 6 Click Score: 20   End of Session Equipment Utilized During Treatment: Gait belt;Rolling walker (2 wheels) Nurse Communication: Mobility status  Activity Tolerance: Patient tolerated treatment well Patient left: in bed;with call bell/phone within  reach;with bed alarm set  OT Visit Diagnosis: Other abnormalities of gait and mobility (R26.89);History of falling (Z91.81);Muscle weakness (generalized) (M62.81)                Time: [id number] OT Time Calculation (min): 16 min Charges:  OT General Charges $OT Visit: 1 Visit OT Evaluation $OT Eval Moderate Complexity: 1 Mod  Seleena Reimers A Sherrin Stahle 06/05/2021, 1:04 PM

## 2021-06-06 ENCOUNTER — Inpatient Hospital Stay (HOSPITAL_COMMUNITY): Payer: No Typology Code available for payment source

## 2021-06-06 DIAGNOSIS — R002 Palpitations: Secondary | ICD-10-CM

## 2021-06-06 DIAGNOSIS — I639 Cerebral infarction, unspecified: Secondary | ICD-10-CM

## 2021-06-06 LAB — LUPUS ANTICOAGULANT PANEL
DRVVT: 42.1 s (ref 0.0–47.0)
PTT Lupus Anticoagulant: 34.6 s (ref 0.0–51.9)

## 2021-06-06 LAB — ANTINUCLEAR ANTIBODIES, IFA: ANA Ab, IFA: NEGATIVE

## 2021-06-06 LAB — HOMOCYSTEINE: Homocysteine: 18 umol/L — ABNORMAL HIGH (ref 0.0–17.2)

## 2021-06-06 MED ORDER — TAMSULOSIN HCL 0.4 MG PO CAPS
0.4000 mg | ORAL_CAPSULE | Freq: Every day | ORAL | Status: DC
  Administered 2021-06-06 – 2021-06-14 (×9): 0.4 mg via ORAL
  Filled 2021-06-06 (×9): qty 1

## 2021-06-06 MED ORDER — AMLODIPINE BESYLATE 5 MG PO TABS
5.0000 mg | ORAL_TABLET | Freq: Every day | ORAL | Status: DC
  Administered 2021-06-06 – 2021-06-14 (×9): 5 mg via ORAL
  Filled 2021-06-06 (×9): qty 1

## 2021-06-06 NOTE — Progress Notes (Addendum)
PROGRESS NOTE    Blaise Falanga  [id number] DOB: 26-Jun-1955 DOA: 06/03/2021 PCP: Laurey Morale, MD   Chief Complain: Patient is a 66 year old male with history of tobacco abuse, prior stroke with no residual deficits, hepatitis C who presented to the emergency department with complaints of 1 week history of slurred speech, falls.  MRI on presentation showed acute to subacute nonhemorrhagic right pontine infarct, scattered nonhemorrhagic infarcts involving the cortical and subcortical aspect of the left frontoparietal and occipital lobe concerning for watershed distribution.  MRA of the head and neck did not show any significant stenosis.  Suspicion for cardioembolic etiology.  Neurology following.  Plan for TEE/loop recorder placement.  PT/OT evaluation done,recommended SNF.  Brief Narrative:   Assessment & Plan:   Principal Problem:   Acute CVA (cerebrovascular accident) ([place]) Active Problems:   AKI (acute kidney injury) ([place])   Tobacco use   Aortic dilatation ([place])   Dysphagia   Acute ischemic stroke: Presented with 1 week history of slurred speech, falls, weakness.MRI on presentation showed acute to subacute nonhemorrhagic right pontine infarct, scattered nonhemorrhagic infarcts involving the cortical and subcortical aspect of the left frontoparietal and occipital lobe concerning for watershed distribution.  MRA of the head and neck did not show any significant stenosis.  Suspicion for cardioembolic etiology.  Neurology following.  Plan for TEE/loop recorder placement.  PT/OT/speech done, recommended skilled nursing facility on discharge. LDL of 63.  Hemoglobin A1c of 4.7.  Continue aspirin 81 mg and Plavix for now.  Started on Lipitor 20 mg daily. Venous Doppler has been pending to rule out DVT Echo showed EF of 55 to 60%, no regional wall motion, mild dilated left atrial size no intra-atrial shunt  AKI: resolved.  IV fluid discontinued  Aortic dilation: Seen on echocardiogram.   Recommend outpatient follow-up with cardiology.  Tobacco use: Continue nicotine patch.  Counseled for cessation  History of hepatitis C :status post treatment  Hypertension: Blood pressure constantly on the higher side.  Started on amlodipine 5 mg daily  Debility/deconditioning: PT/OT recommended skilled NF  on discharge.        DVT prophylaxis:Lovenox Code Status: Full Family Communication: None at the bedside Patient status:Inpatient  Dispo: The patient is from: Home              Anticipated d/c is to:  SNF               Anticipated d/c date is: Pending TEE, loop recorder  Procedures: None  Antimicrobials:  Anti-infectives (From admission, onward)    None       Subjective:  Patient seen and examined at the bedside this morning.  Hemodynamically stable.  Sitting in the chair.  Looks better today, speaks better.  Dysarthria might have improved.   Objective: Vitals:   06/05/21 1917 06/06/21 0043 06/06/21 0415 06/06/21 0728  BP: (!) 159/80 (!) 151/80 (!) 164/99 (!) 164/76  Pulse: 80 65 63 (!) 58  Resp: 20 18 16 16   Temp: 97.9 F (36.6 C) 97.6 F (36.4 C) 98.1 F (36.7 C) 97.9 F (36.6 C)  TempSrc: Oral Oral Oral Oral  SpO2: 99% 99% 98% 100%  Weight:      Height:        Intake/Output Summary (Last 24 hours) at 06/06/2021 0805 Last data filed at 06/06/2021 0700 Gross per 24 hour  Intake 1609.36 ml  Output 300 ml  Net 1309.36 ml   Filed Weights   06/03/21 1504 06/04/21 2200  Weight: 81.6 kg  81.8 kg    Examination:  General exam: Overall comfortable, not in distress HEENT: PERRL Respiratory system:  no wheezes or crackles  Cardiovascular system: S1 & S2 heard, RRR.  Gastrointestinal system: Abdomen is nondistended, soft and nontender. Central nervous system: Alert and oriented, no obvious motor deficits Extremities: No edema, no clubbing ,no cyanosis Skin: No rashes, no ulcers,no icterus      Data Reviewed: I have personally reviewed following  labs and imaging studies  CBC: Recent Labs  Lab 06/03/21 1633  WBC 10.3  NEUTROABS 4.6  HGB 14.2  HCT 43.4  MCV 100.5*  PLT [id number]   Basic Metabolic Panel: Recent Labs  Lab 06/03/21 1633 06/04/21 0418 06/05/21 0603  NA 141 139 144  K 4.0 3.7 3.5  CL 109 108 112*  CO2 26 25 23   GLUCOSE 107* 106* 85  BUN 19 16 10   CREATININE 2.13* 1.47* 1.11  CALCIUM 9.0 8.4* 8.5*   GFR: Estimated Creatinine Clearance: 71.6 mL/min (by C-G formula based on SCr of 1.11 mg/dL). Liver Function Tests: Recent Labs  Lab 06/03/21 1842  AST 28  ALT 22  ALKPHOS 65  BILITOT 0.8  PROT 8.3*  ALBUMIN 4.1   No results for input(s): LIPASE, AMYLASE in the last 168 hours. No results for input(s): AMMONIA in the last 168 hours. Coagulation Profile: No results for input(s): INR, PROTIME in the last 168 hours. Cardiac Enzymes: No results for input(s): CKTOTAL, CKMB, CKMBINDEX, TROPONINI in the last 168 hours. BNP (last 3 results) No results for input(s): PROBNP in the last 8760 hours. HbA1C: Recent Labs    06/05/21 0603  HGBA1C 4.7*   CBG: No results for input(s): GLUCAP in the last 168 hours. Lipid Profile: Recent Labs    06/05/21 0328  CHOL 117  HDL 33*  LDLCALC 63  TRIG 104  CHOLHDL 3.5   Thyroid Function Tests: Recent Labs    06/05/21 0603  TSH 2.277   Anemia Panel: Recent Labs    06/05/21 0603  VITAMINB12 493   Sepsis Labs: No results for input(s): PROCALCITON, LATICACIDVEN in the last 168 hours.  Recent Results (from the past 240 hour(s))  Resp Panel by RT-PCR (Flu A&B, Covid) Nasopharyngeal Swab     Status: None   Collection Time: 06/03/21  6:42 PM   Specimen: Nasopharyngeal Swab; Nasopharyngeal(NP) swabs in vial transport medium  Result Value Ref Range Status   SARS Coronavirus 2 by RT PCR NEGATIVE NEGATIVE Final    Comment: (NOTE) SARS-CoV-2 target nucleic acids are NOT DETECTED.  The SARS-CoV-2 RNA is generally detectable in upper respiratory specimens  during the acute phase of infection. The lowest concentration of SARS-CoV-2 viral copies this assay can detect is 138 copies/mL. A negative result does not preclude SARS-Cov-2 infection and should not be used as the sole basis for treatment or other patient management decisions. A negative result may occur with  improper specimen collection/handling, submission of specimen other than nasopharyngeal swab, presence of viral mutation(s) within the areas targeted by this assay, and inadequate number of viral copies(<138 copies/mL). A negative result must be combined with clinical observations, patient history, and epidemiological information. The expected result is Negative.  Fact Sheet for Patients:  EntrepreneurPulse.com.au  Fact Sheet for Healthcare Providers:  IncredibleEmployment.be  This test is no t yet approved or cleared by the [place] FDA and  has been authorized for detection and/or diagnosis of SARS-CoV-2 by FDA under an Emergency Use Authorization (EUA). This EUA will remain  in effect (  meaning this test can be used) for the duration of the COVID-19 declaration under Section 564(b)(1) of the Act, 21 U.S.C.section 360bbb-3(b)(1), unless the authorization is terminated  or revoked sooner.       Influenza A by PCR NEGATIVE NEGATIVE Final   Influenza B by PCR NEGATIVE NEGATIVE Final    Comment: (NOTE) The Xpert Xpress SARS-CoV-2/FLU/RSV plus assay is intended as an aid in the diagnosis of influenza from Nasopharyngeal swab specimens and should not be used as a sole basis for treatment. Nasal washings and aspirates are unacceptable for Xpert Xpress SARS-CoV-2/FLU/RSV testing.  Fact Sheet for Patients: EntrepreneurPulse.com.au  Fact Sheet for Healthcare Providers: IncredibleEmployment.be  This test is not yet approved or cleared by the [place] FDA and has been authorized for detection  and/or diagnosis of SARS-CoV-2 by FDA under an Emergency Use Authorization (EUA). This EUA will remain in effect (meaning this test can be used) for the duration of the COVID-19 declaration under Section 564(b)(1) of the Act, 21 U.S.C. section 360bbb-3(b)(1), unless the authorization is terminated or revoked.  Performed at [facility], [place] [address], [place], [place] [address]          Radiology Studies: ECHOCARDIOGRAM COMPLETE  Result Date: 06/04/2021    ECHOCARDIOGRAM REPORT   Patient Name:   KUNAAL MALAVE Date of Exam: 06/04/2021 Medical Rec #:  [id number]       Height:       71.0 in Accession #:    [id number]      Weight:       180.0 lb Date of Birth:  1956/04/08        BSA:          2.016 m Patient Age:    83 years        BP:           133/68 mmHg Patient Gender: M               HR:           58 bpm. Exam Location:  Inpatient Procedure: 2D Echo, Cardiac Doppler and Color Doppler Indications:    Stroke  History:        Patient has prior history of Echocardiogram examinations.  Sonographer:    Jyl Heinz Referring Phys: [facility]  1. Left ventricular ejection fraction, by estimation, is 55 to 60%. The left ventricle has normal function. The left ventricle has no regional wall motion abnormalities. There is mild concentric left ventricular hypertrophy. Left ventricular diastolic parameters were normal.  2. Right ventricular systolic function is normal. The right ventricular size is normal. There is normal pulmonary artery systolic pressure.  3. Left atrial size was mildly dilated.  4. The mitral valve is normal in structure. Trivial mitral valve regurgitation. No evidence of mitral stenosis.  5. The aortic valve is tricuspid. Aortic valve regurgitation is mild. No aortic stenosis is present.  6. Aortic dilatation noted. There is moderate dilatation of the aortic root, measuring 44 mm.  7. The inferior vena cava is normal in size with greater than  50% respiratory variability, suggesting right atrial pressure of 3 mmHg. Comparison(s): No significant change from prior study. Prior images reviewed side by side. FINDINGS  Left Ventricle: Left ventricular ejection fraction, by estimation, is 55 to 60%. The left ventricle has normal function. The left ventricle has no regional wall motion abnormalities. The left ventricular internal cavity size was normal in size. There is  mild concentric left  ventricular hypertrophy. Left ventricular diastolic parameters were normal. Normal left ventricular filling pressure. Right Ventricle: The right ventricular size is normal. No increase in right ventricular wall thickness. Right ventricular systolic function is normal. There is normal pulmonary artery systolic pressure. The tricuspid regurgitant velocity is 2.50 m/s, and  with an assumed right atrial pressure of 3 mmHg, the estimated right ventricular systolic pressure is [id number] mmHg. Left Atrium: Left atrial size was mildly dilated. Right Atrium: Right atrial size was normal in size. Pericardium: There is no evidence of pericardial effusion. Mitral Valve: The mitral valve is normal in structure. Trivial mitral valve regurgitation. No evidence of mitral valve stenosis. Tricuspid Valve: The tricuspid valve is normal in structure. Tricuspid valve regurgitation is trivial. No evidence of tricuspid stenosis. Aortic Valve: The aortic valve is tricuspid. Aortic valve regurgitation is mild. Aortic regurgitation PHT measures 605 msec. No aortic stenosis is present. Aortic valve peak gradient measures 3.6 mmHg. Pulmonic Valve: The pulmonic valve was grossly normal. Pulmonic valve regurgitation is mild. No evidence of pulmonic stenosis. Aorta: Aortic dilatation noted. There is moderate dilatation of the aortic root, measuring 44 mm. Venous: The inferior vena cava is normal in size with greater than 50% respiratory variability, suggesting right atrial pressure of 3 mmHg. IAS/Shunts: No  atrial level shunt detected by color flow Doppler.  LEFT VENTRICLE PLAX 2D LVIDd:         4.50 cm      Diastology LVIDs:         3.20 cm      LV e' medial:    6.74 cm/s LV PW:         1.20 cm      LV E/e' medial:  11.4 LV IVS:        1.20 cm      LV e' lateral:   9.03 cm/s LVOT diam:     2.30 cm      LV E/e' lateral: 8.5 LV SV:         84 LV SV Index:   42 LVOT Area:     4.15 cm  LV Volumes (MOD) LV vol d, MOD A2C: 123.0 ml LV vol d, MOD A4C: 151.0 ml LV vol s, MOD A2C: 53.0 ml LV vol s, MOD A4C: 63.7 ml LV SV MOD A2C:     70.0 ml LV SV MOD A4C:     151.0 ml LV SV MOD BP:      77.8 ml RIGHT VENTRICLE             IVC RV Basal diam:  3.80 cm     IVC diam: 2.00 cm RV Mid diam:    3.50 cm RV S prime:     10.30 cm/s TAPSE (M-mode): 2.1 cm LEFT ATRIUM             Index        RIGHT ATRIUM           Index LA diam:        3.80 cm 1.88 cm/m   RA Area:     12.30 cm LA Vol (A2C):   67.2 ml 33.33 ml/m  RA Volume:   20.60 ml  10.22 ml/m LA Vol (A4C):   64.2 ml 31.84 ml/m LA Biplane Vol: 66.1 ml 32.78 ml/m  AORTIC VALVE AV Area (Vmax): 3.81 cm AV Vmax:        94.70 cm/s AV Peak Grad:   3.6 mmHg LVOT Vmax:      86.80  cm/s LVOT Vmean:     62.100 cm/s LVOT VTI:       0.202 m AI PHT:         605 msec  AORTA Ao Root diam: 4.40 cm Ao Asc diam:  3.50 cm MITRAL VALVE               TRICUSPID VALVE MV Area (PHT): 5.02 cm    TR Peak grad:   25.0 mmHg MV Decel Time: 151 msec    TR Vmax:        250.00 cm/s MV E velocity: 77.10 cm/s MV A velocity: 56.60 cm/s  SHUNTS MV E/A ratio:  1.36        Systemic VTI:  0.20 m                            Systemic Diam: 2.30 cm Mihai Croitoru MD Electronically signed by Sanda Klein MD Signature Date/Time: 06/04/2021/12:56:21 PM    Final         Scheduled Meds:  aspirin EC  81 mg Oral Daily   atorvastatin  20 mg Oral Daily   clopidogrel  75 mg Oral Daily   enoxaparin (LOVENOX) injection  40 mg Subcutaneous Q24H   nicotine  14 mg Transdermal Daily   Continuous Infusions:  sodium  chloride 75 mL/hr at 06/05/21 2356     LOS: 3 days    Time spent: 35 mins.More than 50% of that time was spent in counseling and/or coordination of care.      Shelly Coss, MD [facility] P1/04/2022, 8:05 AM

## 2021-06-06 NOTE — Progress Notes (Signed)
Speech Language Pathology Treatment: Cognitive-Linquistic  Patient Details Name: Ryan Montgomery MRN: [id number] DOB: [birth year] Today's Date: 06/06/2021 Time: [id number] SLP Time Calculation (min) (ACUTE ONLY): 27 min  Assessment / Plan / Recommendation Clinical Impression  Diagnostic treatment of cognitive functions completed this date with pt's daughter present in room to provide PLOF. She reports pt with baseline deficits in STM and problem solving/safety, however is concerned for his poor insight into new onset deficits post CVA. [facility] Mental Status (SLUMS) examination administered with pt yielding the following score: 11/30 (normal= 27-30). Deficits noted largely in orientation, immediate (4/5) and delayed (0/5) word list/short story recall, and executive functions. Given assessment and information provided from family interview, suspect exacerbation of baseline deficits. SLP to f/u for treatment of cognitive functions along with motor speech.    HPI HPI: Pt is a 66 y.o. male who presented to the [facility] ED with complaints of 1 week history of slurred speech, falls.  MRI (06/03/21) showed acute to subacute nonhemorrhagic right pontine infarct, scattered nonhemorrhagic infarcts involving the cortical and subcortical aspect of the left frontoparietal and occipital lobe concerning for watershed distribution. Failed yale 06/04/21. Passed 06/05/21. Transferred to [facility] for stroke evaluation per neuro recommendation. PMH: tobacco abuse, prior stroke with no residual deficits, hepatitis C.      SLP Plan  Continue with current plan of care;Goals updated      Recommendations for follow up therapy are one component of a multi-disciplinary discharge planning process, led by the attending physician.  Recommendations may be updated based on patient status, additional functional criteria and insurance authorization.    Recommendations                   Follow Up Recommendations:  Skilled nursing-short term rehab (<3 hours/day) Assistance recommended at discharge: Frequent or constant Supervision/Assistance SLP Visit Diagnosis: Cognitive communication deficit [id number]) Plan: Continue with current plan of care;Goals updated          Ellwood Dense, [place], [place] Office Number: [place]  06/06/2021, 12:52 PM
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
## 2021-06-06 NOTE — Progress Notes (Signed)
VASCULAR LAB    Bilateral lower extremity venous duplex has been performed.  See CV proc for preliminary results.   Wade Sigala, RVT 06/06/2021, 2:45 PM
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
## 2021-06-06 NOTE — Progress Notes (Signed)
Received phone call from pts wife for update regarding pts care. Pts wife stated she believed he would require around the clock care and she would like to look and see if [facility] can provide this. If not she believes rehab would benefit pt. She is unable to provide around the clock care since she works.

## 2021-06-06 NOTE — Progress Notes (Addendum)
STROKE TEAM PROGRESS NOTE   SUBJECTIVE (INTERVAL HISTORY) No family is at the bedside.  Overall his condition continues to improve. Discussed with patient that his TEE and loop recorder placement would be scheduled for Monday. Patient was agreeable to this. Patient had no other questions at this time.    OBJECTIVE Temp:  [97.6 F (36.4 C)-98.1 F (36.7 C)] 97.9 F (36.6 C) (01/12 0728) Pulse Rate:  [58-81] 58 (01/12 0728) Cardiac Rhythm: Normal sinus rhythm (01/12 0700) Resp:  [16-20] 16 (01/12 0728) BP: (151-168)/(76-99) 164/76 (01/12 0728) SpO2:  [98 %-100 %] 100 % (01/12 0728)  No results for input(s): GLUCAP in the last 168 hours. Recent Labs  Lab 06/03/21 1633 06/04/21 0418 06/05/21 0603  NA 141 139 144  K 4.0 3.7 3.5  CL 109 108 112*  CO2 26 25 23   GLUCOSE 107* 106* 85  BUN 19 16 10   CREATININE 2.13* 1.47* 1.11  CALCIUM 9.0 8.4* 8.5*   Recent Labs  Lab 06/03/21 1842  AST 28  ALT 22  ALKPHOS 65  BILITOT 0.8  PROT 8.3*  ALBUMIN 4.1   Recent Labs  Lab 06/03/21 1633  WBC 10.3  NEUTROABS 4.6  HGB 14.2  HCT 43.4  MCV 100.5*  PLT 183   No results for input(s): CKTOTAL, CKMB, CKMBINDEX, TROPONINI in the last 168 hours. No results for input(s): LABPROT, INR in the last 72 hours. Recent Labs    06/04/21 0030  COLORURINE YELLOW  LABSPEC 1.029  PHURINE 5.0  GLUCOSEU NEGATIVE  HGBUR NEGATIVE  BILIRUBINUR NEGATIVE  KETONESUR NEGATIVE  PROTEINUR 30*  NITRITE NEGATIVE  LEUKOCYTESUR NEGATIVE       Component Value Date/Time   CHOL 117 06/05/2021 0328   TRIG 104 06/05/2021 0328   HDL 33 (L) 06/05/2021 0328   CHOLHDL 3.5 06/05/2021 0328   VLDL 21 06/05/2021 0328   LDLCALC 63 06/05/2021 0328   Lab Results  Component Value Date   HGBA1C 4.7 (L) 06/05/2021      Component Value Date/Time   LABOPIA NONE DETECTED 06/04/2021 0030   COCAINSCRNUR NONE DETECTED 06/04/2021 0030   LABBENZ NONE DETECTED 06/04/2021 0030   AMPHETMU NONE DETECTED 06/04/2021  0030   THCU NONE DETECTED 06/04/2021 0030   LABBARB NONE DETECTED 06/04/2021 0030    Recent Labs  Lab 06/03/21 1842  ETH <10    I have personally reviewed the radiological images below and agree with the radiology interpretations.  DG Chest 2 View  Result Date: 06/03/2021 CLINICAL DATA:  Intermittent slurred speech with frequent falls and confusion EXAM: CHEST - 2 VIEW COMPARISON:  Chest x-ray 12/28/2020, CT 09/09/2020 FINDINGS: The heart size and mediastinal contours are within normal limits. Both lungs are clear. Severe chronic compression fracture at T12. IMPRESSION: No active cardiopulmonary disease. Electronically Signed   By: Jasmine Pang M.D.   On: 06/03/2021 15:49   CT Head Wo Contrast  Result Date: 06/03/2021 CLINICAL DATA:  Mental status change. EXAM: CT HEAD WITHOUT CONTRAST TECHNIQUE: Contiguous axial images were obtained from the base of the skull through the vertex without intravenous contrast. COMPARISON:  11/27/2018 FINDINGS: Brain: No acute intracranial hemorrhage. No focal mass lesion. No CT evidence of acute infarction. No midline shift or mass effect. No hydrocephalus. Basilar cisterns are patent. Remote small RIGHT frontal cortical and subcortical infarction. Extensive periventricular and subcortical white matter hypodensities. Generalized cortical atrophy. Vascular: No hyperdense vessel or unexpected calcification. Skull: Normal. Negative for fracture or focal lesion. Sinuses/Orbits: Paranasal sinuses and mastoid air  cells are clear. Orbits are clear. Other: None. IMPRESSION: 1. No acute intracranial findings. 2. 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 3. Remote RIGHT frontal infarction. Electronically Signed   By: Genevive BiStewart  Edmunds M.D.   On: 06/03/2021 16:34   MR ANGIO HEAD WO CONTRAST  Result Date: 06/04/2021 CLINICAL DATA:  Initial evaluation for acute stroke. EXAM: MRA NECK WITHOUT AND WITH CONTRAST MRA HEAD WITHOUT CONTRAST TECHNIQUE:  Multiplanar and multiecho pulse sequences of the neck were obtained without and with intravenous contrast. Angiographic images of the neck were obtained using MRA technique without and with intravenous contrast; Angiographic images of the Circle of Willis were obtained using MRA technique without intravenous contrast. CONTRAST:  9 cc of Gadavist. COMPARISON:  Previous MRI from earlier the same day. FINDINGS: MRA NECK FINDINGS AORTIC ARCH: Partially visualized aortic arch normal in caliber with normal branch pattern. No stenosis seen about the origin of the great vessels. RIGHT CAROTID SYSTEM: Right common and internal carotid arteries patent without stenosis or dissection. No significant atheromatous narrowing about the right carotid bulb. LEFT CAROTID SYSTEM: Left common and internal carotid arteries patent without stenosis or dissection. No significant atheromatous narrowing about the left carotid wall. VERTEBRAL ARTERIES: Both vertebral arteries arise from the subclavian arteries. Right vertebral artery slightly dominant. No visible proximal subclavian artery stenosis. Vertebral arteries patent without stenosis or evidence for dissection. MRA HEAD FINDINGS ANTERIOR CIRCULATION: Both internal carotid arteries widely patent to the termini without stenosis or other abnormality. A1 segments widely patent. Normal anterior communicating artery complex. Anterior cerebral arteries widely patent without stenosis. No M1 stenosis or occlusion. Normal MCA bifurcations. Distal MCA branches well perfused and symmetric. POSTERIOR CIRCULATION: Visualized V4 segments widely patent bilaterally. Right vertebral artery dominant. Partially visualized PICA patent bilaterally. Basilar patent to its distal aspect without stenosis. Superior cerebellar are and posterior cerebral arteries widely patent bilaterally. Small bilateral posterior communicating arteries noted. Anatomic variants: None significant.  No aneurysm. IMPRESSION:  Negative MRA of the head and neck. No large vessel occlusion. No hemodynamically significant or correctable stenosis. Electronically Signed   By: Rise MuBenjamin  McClintock M.D.   On: 06/04/2021 00:30   MR Angiogram Neck W or Wo Contrast  Result Date: 06/04/2021 CLINICAL DATA:  Initial evaluation for acute stroke. EXAM: MRA NECK WITHOUT AND WITH CONTRAST MRA HEAD WITHOUT CONTRAST TECHNIQUE: Multiplanar and multiecho pulse sequences of the neck were obtained without and with intravenous contrast. Angiographic images of the neck were obtained using MRA technique without and with intravenous contrast; Angiographic images of the Circle of Willis were obtained using MRA technique without intravenous contrast. CONTRAST:  9 cc of Gadavist. COMPARISON:  Previous MRI from earlier the same day. FINDINGS: MRA NECK FINDINGS AORTIC ARCH: Partially visualized aortic arch normal in caliber with normal branch pattern. No stenosis seen about the origin of the great vessels. RIGHT CAROTID SYSTEM: Right common and internal carotid arteries patent without stenosis or dissection. No significant atheromatous narrowing about the right carotid bulb. LEFT CAROTID SYSTEM: Left common and internal carotid arteries patent without stenosis or dissection. No significant atheromatous narrowing about the left carotid wall. VERTEBRAL ARTERIES: Both vertebral arteries arise from the subclavian arteries. Right vertebral artery slightly dominant. No visible proximal subclavian artery stenosis. Vertebral arteries patent without stenosis or evidence for dissection. MRA HEAD FINDINGS ANTERIOR CIRCULATION: Both internal carotid arteries widely patent to the termini without stenosis or other abnormality. A1 segments widely patent. Normal anterior communicating artery complex. Anterior cerebral arteries widely patent without stenosis.  No M1 stenosis or occlusion. Normal MCA bifurcations. Distal MCA branches well perfused and symmetric. POSTERIOR CIRCULATION:  Visualized V4 segments widely patent bilaterally. Right vertebral artery dominant. Partially visualized PICA patent bilaterally. Basilar patent to its distal aspect without stenosis. Superior cerebellar are and posterior cerebral arteries widely patent bilaterally. Small bilateral posterior communicating arteries noted. Anatomic variants: None significant.  No aneurysm. IMPRESSION: Negative MRA of the head and neck. No large vessel occlusion. No hemodynamically significant or correctable stenosis. Electronically Signed   By: Rise Mu M.D.   On: 06/04/2021 00:30   MR Brain Wo Contrast (neuro protocol)  Result Date: 06/03/2021 CLINICAL DATA:  Initial evaluation for neuro deficit, stroke suspected./ EXAM: MRI HEAD WITHOUT CONTRAST TECHNIQUE: Multiplanar, multiecho pulse sequences of the brain and surrounding structures were obtained without intravenous contrast. COMPARISON:  Head CT from earlier the same day. FINDINGS: Brain: Cerebral volume within normal limits for age. Patchy and confluent T2/FLAIR hyperintensity involving the periventricular deep white matter both cerebral hemispheres most consistent with chronic small vessel ischemic disease, moderate to advanced in nature. Multiple scattered remote lacunar infarcts present about the bilateral basal ganglia/hemispheric cerebral white matter. Chronic right MCA territory infarct noted at the high right frontal lobe. Additional chronic left cerebellar infarcts noted. Additional small areas of encephalomalacia and gliosis involving the anterior frontal poles favored to be related to remote trauma. 12 mm acute ischemic infarcts seen involving the right pons (series 5, image 62). Additional scattered acute ischemic infarcts seen involving the cortical and subcortical aspect of the left frontal, parietal, and occipital regions, consistent with acute ischemic infarcts as well (series 5, image 84). These are watershed in distribution. No associated hemorrhage  or significant mass effect about these areas of infarction. Otherwise, gray-white matter differentiation maintained. No areas of acute or chronic intracranial hemorrhage. No mass lesion, midline shift or mass effect. No hydrocephalus or extra-axial fluid collection. Pituitary gland suprasellar region normal. Midline structures intact. Vascular: Major intracranial vascular flow voids are maintained. Skull and upper cervical spine: Craniocervical junction within normal limits. Bone marrow signal intensity normal. No scalp soft tissue abnormality. Sinuses/Orbits: Globes and orbital soft tissues demonstrate no acute finding. Mild scattered mucosal thickening noted within the ethmoidal air cells. Paranasal sinuses are otherwise clear. Trace right mastoid effusion noted, of doubtful significance. Other: None. IMPRESSION: 1. 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 2. Additional scattered acute ischemic nonhemorrhagic infarcts involving the cortical and subcortical aspect of the left frontal, parietal, and occipital lobes. These are watershed in distribution. 3. Underlying moderate to advanced chronic microvascular ischemic disease with multiple remote infarcts involving the right frontal lobe, bilateral basal ganglia/hemispheric cerebral white matter, left cerebellum. 4. Additional small areas of encephalomalacia involving the anterior frontal lobes bilaterally, likely related to remote trauma. Electronically Signed   By: Rise Mu M.D.   On: 06/03/2021 20:09   ECHOCARDIOGRAM COMPLETE  Result Date: 06/04/2021    ECHOCARDIOGRAM REPORT   Patient Name:   Ryan Montgomery Date of Exam: 06/04/2021 Medical Rec #:  [id number]       Height:       71.0 in Accession #:    [id number]      Weight:       180.0 lb Date of Birth:  Jan 20, 1956        BSA:          2.016 m Patient Age:    65 years        BP:  133/68 mmHg Patient Gender: M               HR:           58 bpm. Exam Location:  Inpatient  Procedure: 2D Echo, Cardiac Doppler and Color Doppler Indications:    Stroke  History:        Patient has prior history of Echocardiogram examinations.  Sonographer:    Cleatis Polka Referring Phys: [phone] DANIEL P GOODRICH IMPRESSIONS  1. Left ventricular ejection fraction, by estimation, is 55 to 60%. The left ventricle has normal function. The left ventricle has no regional wall motion abnormalities. There is mild concentric left ventricular hypertrophy. Left ventricular diastolic parameters were normal.  2. Right ventricular systolic function is normal. The right ventricular size is normal. There is normal pulmonary artery systolic pressure.  3. Left atrial size was mildly dilated.  4. The mitral valve is normal in structure. Trivial mitral valve regurgitation. No evidence of mitral stenosis.  5. The aortic valve is tricuspid. Aortic valve regurgitation is mild. No aortic stenosis is present.  6. Aortic dilatation noted. There is moderate dilatation of the aortic root, measuring 44 mm.  7. The inferior vena cava is normal in size with greater than 50% respiratory variability, suggesting right atrial pressure of 3 mmHg. Comparison(s): No significant change from prior study. Prior images reviewed side by side. FINDINGS  Left Ventricle: Left ventricular ejection fraction, by estimation, is 55 to 60%. The left ventricle has normal function. The left ventricle has no regional wall motion abnormalities. The left ventricular internal cavity size was normal in size. There is  mild concentric left ventricular hypertrophy. Left ventricular diastolic parameters were normal. Normal left ventricular filling pressure. Right Ventricle: The right ventricular size is normal. No increase in right ventricular wall thickness. Right ventricular systolic function is normal. There is normal pulmonary artery systolic pressure. The tricuspid regurgitant velocity is 2.50 m/s, and  with an assumed right atrial pressure of 3 mmHg, the  estimated right ventricular systolic pressure is 28.0 mmHg. Left Atrium: Left atrial size was mildly dilated. Right Atrium: Right atrial size was normal in size. Pericardium: There is no evidence of pericardial effusion. Mitral Valve: The mitral valve is normal in structure. Trivial mitral valve regurgitation. No evidence of mitral valve stenosis. Tricuspid Valve: The tricuspid valve is normal in structure. Tricuspid valve regurgitation is trivial. No evidence of tricuspid stenosis. Aortic Valve: The aortic valve is tricuspid. Aortic valve regurgitation is mild. Aortic regurgitation PHT measures 605 msec. No aortic stenosis is present. Aortic valve peak gradient measures 3.6 mmHg. Pulmonic Valve: The pulmonic valve was grossly normal. Pulmonic valve regurgitation is mild. No evidence of pulmonic stenosis. Aorta: Aortic dilatation noted. There is moderate dilatation of the aortic root, measuring 44 mm. Venous: The inferior vena cava is normal in size with greater than 50% respiratory variability, suggesting right atrial pressure of 3 mmHg. IAS/Shunts: No atrial level shunt detected by color flow Doppler.  LEFT VENTRICLE PLAX 2D LVIDd:         4.50 cm      Diastology LVIDs:         3.20 cm      LV e' medial:    6.74 cm/s LV PW:         1.20 cm      LV E/e' medial:  11.4 LV IVS:        1.20 cm      LV e' lateral:   9.03 cm/s LVOT  diam:     2.30 cm      LV E/e' lateral: 8.5 LV SV:         84 LV SV Index:   42 LVOT Area:     4.15 cm  LV Volumes (MOD) LV vol d, MOD A2C: 123.0 ml LV vol d, MOD A4C: 151.0 ml LV vol s, MOD A2C: 53.0 ml LV vol s, MOD A4C: 63.7 ml LV SV MOD A2C:     70.0 ml LV SV MOD A4C:     151.0 ml LV SV MOD BP:      77.8 ml RIGHT VENTRICLE             IVC RV Basal diam:  3.80 cm     IVC diam: 2.00 cm RV Mid diam:    3.50 cm RV S prime:     10.30 cm/s TAPSE (M-mode): 2.1 cm LEFT ATRIUM             Index        RIGHT ATRIUM           Index LA diam:        3.80 cm 1.88 cm/m   RA Area:     12.30 cm LA Vol  (A2C):   67.2 ml 33.33 ml/m  RA Volume:   20.60 ml  10.22 ml/m LA Vol (A4C):   64.2 ml 31.84 ml/m LA Biplane Vol: 66.1 ml 32.78 ml/m  AORTIC VALVE AV Area (Vmax): 3.81 cm AV Vmax:        94.70 cm/s AV Peak Grad:   3.6 mmHg LVOT Vmax:      86.80 cm/s LVOT Vmean:     62.100 cm/s LVOT VTI:       0.202 m AI PHT:         605 msec  AORTA Ao Root diam: 4.40 cm Ao Asc diam:  3.50 cm MITRAL VALVE               TRICUSPID VALVE MV Area (PHT): 5.02 cm    TR Peak grad:   25.0 mmHg MV Decel Time: 151 msec    TR Vmax:        250.00 cm/s MV E velocity: 77.10 cm/s MV A velocity: 56.60 cm/s  SHUNTS MV E/A ratio:  1.36        Systemic VTI:  0.20 m                            Systemic Diam: 2.30 cm Mihai Croitoru MD Electronically signed by Thurmon FairMihai Croitoru MD Signature Date/Time: 06/04/2021/12:56:21 PM    Final      PHYSICAL EXAM  Temp:  [97.6 F (36.4 C)-98.1 F (36.7 C)] 97.9 F (36.6 C) (01/12 0728) Pulse Rate:  [58-81] 58 (01/12 0728) Resp:  [16-20] 16 (01/12 0728) BP: (151-168)/(76-99) 164/76 (01/12 0728) SpO2:  [98 %-100 %] 100 % (01/12 0728)  General - Well nourished, well developed, in no apparent distress.  Ophthalmologic - fundi not visualized due to noncooperation.  Cardiovascular - Regular rhythm and rate.  Mental Status -  Level of arousal and orientation to time, place, and person were intact. Language including expression, naming, repetition, comprehension was assessed and found intact. Fund of Knowledge was assessed and was intact.  Cranial Nerves II - XII - II - Visual field intact OU. III, IV, VI - Extraocular movements intact. V - Facial sensation intact bilaterally. VII - Facial movement intact bilaterally.  VIII - Hearing & vestibular intact bilaterally. X - Palate elevates symmetrically. XI - Chin turning & shoulder shrug intact bilaterally. XII - Tongue protrusion intact.  Motor Strength - The patients strength was normal in all extremities and pronator drift was absent  except mild right hand grip and dexterity difficulty, however patient stated that was chronic.  Bulk was normal and fasciculations were absent.   Motor Tone - Muscle tone was assessed at the neck and appendages and was normal.  Reflexes - The patients reflexes were symmetrical in all extremities and he had no pathological reflexes.  Sensory - Light touch, temperature/pinprick were assessed and were symmetrical.    Coordination - The patient had normal movements in the hands and feet with no ataxia or dysmetria.  Tremor was absent.  Gait and Station - deferred.   ASSESSMENT/PLAN Ryan Montgomery is a 66 y.o. male with history of smoker, hepatitis C, stroke 3 to 4 years ago without residual admitted for slurred speech, fall at home and confusion. No tPA given due to outside window.    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 MRI right pontine and scattered acute ischemic nonhemorrhagic infarcts involving the cortical and subcortical aspect of the left frontal, parietal, and occipital lobes. These are watershed in distribution MRA head and neck unremarkable LE venous Doppler pending 2D Echo EF 55 to 60% LDL 73 HgbA1c 4.7 Hypercoagulable work-up pending Recommended TEE and loop recorder.  Plan to have both completed on Monday. Lovenox for VTE prophylaxis No antithrombotic prior to admission, now on aspirin 81 mg daily and clopidogrel 75 mg daily DAPT for 3 weeks and then aspirin alone. Patient counseled to be compliant with his antithrombotic medications Ongoing aggressive stroke risk factor management Therapy recommendations: SNF Disposition: Pending  History of LOC 11/2019 LOC with possible jerking movement EEG negative Concern for seizure, put on Keppra 500 mg twice daily However Keppra was not on his current medication list No more similar episode  since  History of heart palpitation Patient stated that 5 years ago, while he was at work, he had heart fluttering, heart palpitation for 30 minutes.  He did not seek for medical attention at that time Given currently embolic Stroke, recommend loop recorder to rule out afib  Hypertension stable Long term BP goal normotensive  Lipid management Home meds: None LDL 63, goal < 70 Now on Lipitor 20 No high intensity statin due to LDL at goal Continue statin at discharge  Tobacco abuse Current smoker Smoking cessation counseling provided Nicotine patch provided Pt is willing to quit  Other Stroke Risk Factors Hx stroke/TIA - 3-4 years ago, details not clear, no residual left  Other Active Problems AKI, creatinine 2.13-1.47-1.11 Mild leukocytosis, WBC 10.3  Hospital day # 3   Park Pope, MD PGY1 Resident 06/06/2021 11:53 AM  ATTENDING NOTE: I reviewed above note and agree with the assessment and plan. Pt was seen and examined.   No family at bedside.  Patient sitting in chair, no complaints, neuro stable unchanged.  Discussed with cardiology, will have to arrange TEE and loop recorder on Monday.  PT/OT recommend SNF, social worker is working on placement.  Continue DAPT and statin.  For detailed assessment and plan, please refer to above as I have made changes wherever appropriate.   Marvel Plan, MD PhD Stroke Neurology 06/06/2021 2:49 PM    To contact Stroke Continuity provider, please  refer to [email or web address] After hours, contact General Neurology

## 2021-06-06 NOTE — Plan of Care (Signed)
Pt is alert oriented x 4. Pt has condom cath. Denies pain. No distress noted.   Problem: Education: Goal: Knowledge of disease or condition will improve Outcome: Progressing Goal: Knowledge of secondary prevention will improve (SELECT ALL) Outcome: Progressing Goal: Knowledge of patient specific risk factors will improve (INDIVIDUALIZE FOR PATIENT) Outcome: Progressing   Problem: Health Behavior/Discharge Planning: Goal: Ability to manage health-related needs will improve Outcome: Progressing   Problem: Self-Care: Goal: Ability to participate in self-care as condition permits will improve Outcome: Progressing Goal: Ability to communicate needs accurately will improve Outcome: Progressing   Problem: Nutrition: Goal: Risk of aspiration will decrease Outcome: Progressing

## 2021-06-07 LAB — CARDIOLIPIN ANTIBODIES, IGG, IGM, IGA
Anticardiolipin IgA: <9 APL U/mL (ref 0–11)
Anticardiolipin IgG: <9 GPL U/mL (ref 0–14)
Anticardiolipin IgM: <9 MPL U/mL (ref 0–12)

## 2021-06-07 MED ORDER — SODIUM CHLORIDE 0.9 % IV SOLN: INTRAVENOUS | Status: DC

## 2021-06-07 NOTE — Progress Notes (Addendum)
° ° °  [facility] has been requested to perform a transesophageal echocardiogram on 06/10/2021 AT 8:00 AM for CVA.    After careful review of history and examination, the risks and benefits of transesophageal echocardiogram have been explained including risks of esophageal damage, perforation (1:10,000 risk), bleeding, pharyngeal hematoma as well as other potential complications associated with conscious sedation including aspiration, arrhythmia, respiratory failure and death. Alternatives to treatment were discussed, questions were answered. Patient is willing to proceed.   Rosaria Ferries, PA-C 06/07/2021 3:56 PM

## 2021-06-07 NOTE — Progress Notes (Signed)
Occupational Therapy Treatment Patient Details Name: Ryan Montgomery MRN: [id number] DOB: December 31, 1955 Today's Date: 06/07/2021   History of present illness Ryan Montgomery is a 66 y.o. male presenting with syncopal episode with jerking motions, urinary incontinence and LOC. THC in UDS. PMH is significant for treated hepatitis C and gout, h/o stroke, h/o alcohol abuse, L hip sx in 08/2019.   OT comments  Patient continues to make  progress towards goals in skilled OT session. Patient's session encompassed functional transfers, ADLs at the sink, and further assessment of cognition. Patient with flat and quiet affect, but willing to participate. Patient with prolonged purposeful movements throughout entirety of session, but able to complete ADLs at sink without cues for sequencing. Patient able to complete clock test without errors (squinting throughout due to not having glasses) but unable to recall 3 words told to him at beginning of session despite 2 repetitions. Continue with current discharge plan; therapy will continue to follow.     Recommendations for follow up therapy are one component of a multi-disciplinary discharge planning process, led by the attending physician.  Recommendations may be updated based on patient status, additional functional criteria and insurance authorization.    Follow Up Recommendations  Skilled nursing-short term rehab (<3 hours/day)    Assistance Recommended at Discharge Frequent or constant Supervision/Assistance  Patient can return home with the following  Assist for transportation;Help with stairs or ramp for entrance;Assistance with cooking/housework;Direct supervision/assist for financial management;Direct supervision/assist for medications management   Equipment Recommendations  BSC/3in1 (RW)    Recommendations for Other Services      Precautions / Restrictions Precautions Precautions: Fall Precaution Comments: noted impaired cognitive  function Restrictions Weight Bearing Restrictions: No       Mobility Bed Mobility Overal bed mobility: Modified Independent             General bed mobility comments: improved motor planning in comparision to previous notes    Transfers Overall transfer level: Needs assistance Equipment used: Straight cane Transfers: Sit to/from Stand Sit to Stand: Min guard           General transfer comment: increased time     Balance Overall balance assessment: Needs assistance Sitting-balance support: Feet supported Sitting balance-Leahy Scale: Fair     Standing balance support: During functional activity;No upper extremity supported Standing balance-Leahy Scale: Fair                             ADL either performed or assessed with clinical judgement   ADL Overall ADL's : Needs assistance/impaired     Grooming: Wash/dry hands;Wash/dry face;Oral care;Set up;Standing Grooming Details (indicate cue type and reason): appropriate sequencing throghout, but prolonged and purposeful movements noted                 Toilet Transfer: Min guard;Ambulation;Rolling walker (2 wheels) Toilet Transfer Details (indicate cue type and reason): simulated with sit<>stands         Functional mobility during ADLs: Min guard;Cane General ADL Comments: slow and purposeful movement throughout session, very flat and quiet and minimally engaging in conversation    Extremity/Trunk Assessment              Vision Baseline Vision/History: 1 Wears glasses Additional Comments: squinting throughout written activity, did not have glasses with him   Perception     Praxis      Cognition Arousal/Alertness: Awake/alert Behavior During Therapy: WFL for tasks assessed/performed Overall Cognitive Status: No family/caregiver  present to determine baseline cognitive functioning                                 General Comments: patient with cotninued decrease in  cognition, unable to list any of the 3 words provided to him at the beginning of the session. patient able to draw a clock and set the time correctly. patient squinting throughout due to not having glasses therefore cannot determine if legibility is an accurate representation          Exercises     Shoulder Instructions       General Comments      Pertinent Vitals/ Pain       Pain Assessment: Faces Faces Pain Scale: Hurts little more Pain Location: wincing throughout movement, catheter bothering him, feet also seem to be bothering him but did not state when questioned Pain Descriptors / Indicators: Discomfort Pain Intervention(s): Limited activity within patient's tolerance;Monitored during session;Repositioned  Home Living                                          Prior Functioning/Environment              Frequency  Min 2X/week        Progress Toward Goals  OT Goals(current goals can now be found in the care plan section)  Progress towards OT goals: Progressing toward goals  Acute Rehab OT Goals Patient Stated Goal: to go home OT Goal Formulation: With patient Time For Goal Achievement: 06/19/21 Potential to Achieve Goals: [place] Discharge plan remains appropriate    Co-evaluation                 AM-PAC OT "6 Clicks" Daily Activity     Outcome Measure   Help from another person eating meals?: None Help from another person taking care of personal grooming?: A Little Help from another person toileting, which includes using toliet, bedpan, or urinal?: A Little Help from another person bathing (including washing, rinsing, drying)?: A Little Help from another person to put on and taking off regular upper body clothing?: None Help from another person to put on and taking off regular lower body clothing?: A Little 6 Click Score: 20    End of Session Equipment Utilized During Treatment: Gait belt;Other (comment) Kasandra Knudsen)  OT Visit  Diagnosis: Other abnormalities of gait and mobility (R26.89);History of falling (Z91.81);Muscle weakness (generalized) (M62.81)   Activity Tolerance Patient tolerated treatment well   Patient Left in bed;with call bell/phone within reach;with bed alarm set (sitting EOB)   Nurse Communication Mobility status        Time: [id number] OT Time Calculation (min): 21 min  Charges: OT General Charges $OT Visit: 1 Visit OT Treatments $Self Care/Home Management : 8-22 mins  Corinne Ports E. Lonnette Shrode, COTA/L Acute Rehabilitation Services [phone] [place] 06/07/2021, 2:07 PM

## 2021-06-07 NOTE — Progress Notes (Signed)
PROGRESS NOTE    Ryan Montgomery  [id number] DOB: 21-Dec-1955 DOA: 06/03/2021 PCP: Laurey Morale, MD   Chief Complain: Patient is a 66 year old male with history of tobacco abuse, prior stroke with no residual deficits, hepatitis C who presented to the emergency department with complaints of 1 week history of slurred speech, falls.  MRI on presentation showed acute to subacute nonhemorrhagic right pontine infarct, scattered nonhemorrhagic infarcts involving the cortical and subcortical aspect of the left frontoparietal and occipital lobe concerning for watershed distribution.  MRA of the head and neck did not show any significant stenosis.  Suspicion for cardioembolic etiology.  Neurology following.  Plan for TEE/loop recorder placement.  PT/OT evaluation done,recommended SNF.  Brief Narrative:   Assessment & Plan:   Principal Problem:   Acute CVA (cerebrovascular accident) ([place]) Active Problems:   AKI (acute kidney injury) ([place])   Tobacco use   Aortic dilatation ([place])   Dysphagia   Acute ischemic stroke: Presented with 1 week history of slurred speech, falls, weakness.MRI on presentation showed acute to subacute nonhemorrhagic right pontine infarct, scattered nonhemorrhagic infarcts involving the cortical and subcortical aspect of the left frontoparietal and occipital lobe concerning for watershed distribution.  MRA of the head and neck did not show any significant stenosis.  Suspicion for cardioembolic etiology.  Neurology following.  Plan for TEE/loop recorder placement,will be done on Monday.  PT/OT/speech done, recommended skilled nursing facility on discharge. LDL of 63.  Hemoglobin A1c of 4.7.  Continue aspirin 81 mg and Plavix for now.  Started on Lipitor 20 mg daily. Venous Doppler  ruled out DVT Echo showed EF of 55 to 60%, no regional wall motion, mild dilated left atrial size no intra-atrial shunt  AKI: resolved.  IV fluid discontinued  Aortic dilation: Seen on  echocardiogram.  Recommend outpatient follow-up with cardiology.  Tobacco use: Continue nicotine patch.  Counseled for cessation  History of hepatitis C :status post treatment  Hypertension: Blood pressure stable now . On amlodipine 5 mg daily  Debility/deconditioning: PT/OT recommended skilled NF  on discharge.        DVT prophylaxis:Lovenox Code Status: Full Family Communication: None at the bedside Patient status:Inpatient  Dispo: The patient is from: Home              Anticipated d/c is to:  SNF               Anticipated d/c date is: Pending TEE, loop recorder  Procedures: None  Antimicrobials:  Anti-infectives (From admission, onward)    None       Subjective:  Patient seen and examined the bedside this morning.  Hemodynamically stable.  No new complaints.  Eating his breakfast   Objective: Vitals:   06/06/21 1542 06/06/21 1938 06/06/21 2332 06/07/21 0330  BP: (!) 152/91 135/87 130/79 126/90  Pulse: 74 84 83 74  Resp: 17 16 (!) 24 12  Temp: 98.7 F (37.1 C) 98.3 F (36.8 C) 98.3 F (36.8 C) 98.2 F (36.8 C)  TempSrc: Oral Oral Oral Oral  SpO2: 100% 97% 99% 98%  Weight:      Height:        Intake/Output Summary (Last 24 hours) at 06/07/2021 0722 Last data filed at 06/07/2021 0400 Gross per 24 hour  Intake 237 ml  Output 1600 ml  Net -1363 ml   Filed Weights   06/03/21 1504 06/04/21 2200  Weight: 81.6 kg 81.8 kg    Examination:  General exam: Overall comfortable, not in distress HEENT:  PERRL Respiratory system:  no wheezes or crackles  Cardiovascular system: S1 & S2 heard, RRR.  Gastrointestinal system: Abdomen is nondistended, soft and nontender. Central nervous system: Alert and oriented Extremities: No edema, no clubbing ,no cyanosis Skin: No rashes, no ulcers,no icterus     Data Reviewed: I have personally reviewed following labs and imaging studies  CBC: Recent Labs  Lab 06/03/21 1633  WBC 10.3  NEUTROABS 4.6  HGB 14.2  HCT  43.4  MCV 100.5*  PLT [id number]   Basic Metabolic Panel: Recent Labs  Lab 06/03/21 1633 06/04/21 0418 06/05/21 0603  NA 141 139 144  K 4.0 3.7 3.5  CL 109 108 112*  CO2 26 25 23   GLUCOSE 107* 106* 85  BUN 19 16 10   CREATININE 2.13* 1.47* 1.11  CALCIUM 9.0 8.4* 8.5*   GFR: Estimated Creatinine Clearance: 71.6 mL/min (by C-G formula based on SCr of 1.11 mg/dL). Liver Function Tests: Recent Labs  Lab 06/03/21 1842  AST 28  ALT 22  ALKPHOS 65  BILITOT 0.8  PROT 8.3*  ALBUMIN 4.1   No results for input(s): LIPASE, AMYLASE in the last 168 hours. No results for input(s): AMMONIA in the last 168 hours. Coagulation Profile: No results for input(s): INR, PROTIME in the last 168 hours. Cardiac Enzymes: No results for input(s): CKTOTAL, CKMB, CKMBINDEX, TROPONINI in the last 168 hours. BNP (last 3 results) No results for input(s): PROBNP in the last 8760 hours. HbA1C: Recent Labs    06/05/21 0603  HGBA1C 4.7*   CBG: No results for input(s): GLUCAP in the last 168 hours. Lipid Profile: Recent Labs    06/05/21 0328  CHOL 117  HDL 33*  LDLCALC 63  TRIG 104  CHOLHDL 3.5   Thyroid Function Tests: Recent Labs    06/05/21 0603  TSH 2.277   Anemia Panel: Recent Labs    06/05/21 0603  VITAMINB12 493   Sepsis Labs: No results for input(s): PROCALCITON, LATICACIDVEN in the last 168 hours.  Recent Results (from the past 240 hour(s))  Resp Panel by RT-PCR (Flu A&B, Covid) Nasopharyngeal Swab     Status: None   Collection Time: 06/03/21  6:42 PM   Specimen: Nasopharyngeal Swab; Nasopharyngeal(NP) swabs in vial transport medium  Result Value Ref Range Status   SARS Coronavirus 2 by RT PCR NEGATIVE NEGATIVE Final    Comment: (NOTE) SARS-CoV-2 target nucleic acids are NOT DETECTED.  The SARS-CoV-2 RNA is generally detectable in upper respiratory specimens during the acute phase of infection. The lowest concentration of SARS-CoV-2 viral copies this assay can detect  is 138 copies/mL. A negative result does not preclude SARS-Cov-2 infection and should not be used as the sole basis for treatment or other patient management decisions. A negative result may occur with  improper specimen collection/handling, submission of specimen other than nasopharyngeal swab, presence of viral mutation(s) within the areas targeted by this assay, and inadequate number of viral copies(<138 copies/mL). A negative result must be combined with clinical observations, patient history, and epidemiological information. The expected result is Negative.  Fact Sheet for Patients:  EntrepreneurPulse.com.au  Fact Sheet for Healthcare Providers:  IncredibleEmployment.be  This test is no t yet approved or cleared by the [place] FDA and  has been authorized for detection and/or diagnosis of SARS-CoV-2 by FDA under an Emergency Use Authorization (EUA). This EUA will remain  in effect (meaning this test can be used) for the duration of the COVID-19 declaration under Section 564(b)(1) of the Act, 21  U.S.C.section 360bbb-3(b)(1), unless the authorization is terminated  or revoked sooner.       Influenza A by PCR NEGATIVE NEGATIVE Final   Influenza B by PCR NEGATIVE NEGATIVE Final    Comment: (NOTE) The Xpert Xpress SARS-CoV-2/FLU/RSV plus assay is intended as an aid in the diagnosis of influenza from Nasopharyngeal swab specimens and should not be used as a sole basis for treatment. Nasal washings and aspirates are unacceptable for Xpert Xpress SARS-CoV-2/FLU/RSV testing.  Fact Sheet for Patients: EntrepreneurPulse.com.au  Fact Sheet for Healthcare Providers: IncredibleEmployment.be  This test is not yet approved or cleared by the [place] FDA and has been authorized for detection and/or diagnosis of SARS-CoV-2 by FDA under an Emergency Use Authorization (EUA). This EUA will remain in effect  (meaning this test can be used) for the duration of the COVID-19 declaration under Section 564(b)(1) of the Act, 21 U.S.C. section 360bbb-3(b)(1), unless the authorization is terminated or revoked.  Performed at [facility], [place] [address], [place], [place] [address]          Radiology Studies: VAS [place] LOWER EXTREMITY VENOUS (DVT)  Result Date: 06/06/2021  Lower Venous DVT Study Patient Name:  TYKEE HATCHEL  Date of Exam:   06/06/2021 Medical Rec #: [id number]        Accession #:    [id number] Date of Birth: Sep 26, 1955         Patient Gender: M Patient Age:   23 years Exam Location:  [facility] Procedure:      VAS [place] LOWER EXTREMITY VENOUS (DVT) Referring Phys: Cornelius Moras XU --------------------------------------------------------------------------------  Indications: Stroke.  Comparison Study: No prior study Performing Technologist: Sharion Dove RVS  Examination Guidelines: A complete evaluation includes B-mode imaging, spectral Doppler, color Doppler, and power Doppler as needed of all accessible portions of each vessel. Bilateral testing is considered an integral part of a complete examination. Limited examinations for reoccurring indications may be performed as noted. The reflux portion of the exam is performed with the patient in reverse Trendelenburg.  +---------+---------------+---------+-----------+----------+--------------+  RIGHT     Compressibility Phasicity Spontaneity Properties Thrombus Aging  +---------+---------------+---------+-----------+----------+--------------+  CFV       Full            Yes       Yes                                    +---------+---------------+---------+-----------+----------+--------------+  SFJ       Full                                                             +---------+---------------+---------+-----------+----------+--------------+  FV Prox   Full                                                              +---------+---------------+---------+-----------+----------+--------------+  FV Mid    Full                                                             +---------+---------------+---------+-----------+----------+--------------+  FV Distal Full                                                             +---------+---------------+---------+-----------+----------+--------------+  PFV       Full                                                             +---------+---------------+---------+-----------+----------+--------------+  POP       Full            Yes       Yes                                    +---------+---------------+---------+-----------+----------+--------------+  PTV       Full                                                             +---------+---------------+---------+-----------+----------+--------------+  PERO      Full                                                             +---------+---------------+---------+-----------+----------+--------------+   +---------+---------------+---------+-----------+----------+--------------+  LEFT      Compressibility Phasicity Spontaneity Properties Thrombus Aging  +---------+---------------+---------+-----------+----------+--------------+  CFV       Full            Yes       Yes                                    +---------+---------------+---------+-----------+----------+--------------+  SFJ       Full                                                             +---------+---------------+---------+-----------+----------+--------------+  FV Prox   Full                                                             +---------+---------------+---------+-----------+----------+--------------+  FV Mid    Full                                                             +---------+---------------+---------+-----------+----------+--------------+  FV Distal Full                                                              +---------+---------------+---------+-----------+----------+--------------+  PFV       Full                                                             +---------+---------------+---------+-----------+----------+--------------+  POP       Full            Yes       Yes                                    +---------+---------------+---------+-----------+----------+--------------+  PTV       Full                                                             +---------+---------------+---------+-----------+----------+--------------+  PERO      Full                                                             +---------+---------------+---------+-----------+----------+--------------+     Summary: BILATERAL: - No evidence of deep vein thrombosis seen in the lower extremities, bilaterally. -No evidence of popliteal cyst, bilaterally.   *See table(s) above for measurements and observations. Electronically signed by Orlie Pollen on 06/06/2021 at 3:56:46 PM.    Final         Scheduled Meds:  amLODipine  5 mg Oral Daily   aspirin EC  81 mg Oral Daily   atorvastatin  20 mg Oral Daily   clopidogrel  75 mg Oral Daily   enoxaparin (LOVENOX) injection  40 mg Subcutaneous Q24H   nicotine  14 mg Transdermal Daily   tamsulosin  0.4 mg Oral Daily   Continuous Infusions:     LOS: 4 days    Time spent: 35 mins.More than 50% of that time was spent in counseling and/or coordination of care.      Shelly Coss, MD [facility] P1/13/2023, 7:22 AM

## 2021-06-07 NOTE — Plan of Care (Signed)
°  Problem: Education: Goal: Knowledge of disease or condition will improve Outcome: Progressing Goal: Knowledge of secondary prevention will improve ([facility]) Outcome: Progressing   Problem: Health Behavior/Discharge Planning: Goal: Ability to manage health-related needs will improve Outcome: Progressing   Problem: Self-Care: Goal: Ability to participate in self-care as condition permits will improve Outcome: Progressing

## 2021-06-07 NOTE — Progress Notes (Signed)
Speech Language Pathology Treatment: Cognitive-Linquistic  Patient Details Name: Ryan Montgomery MRN: [id number] DOB: [birth year] Today's Date: 06/07/2021 Time: [id number] SLP Time Calculation (min) (ACUTE ONLY): 25 min  Assessment / Plan / Recommendation Clinical Impression  Pt seen for cognitive/speech tx with focus primarily on problem solving/insight into deficits/memory tasks with recall of functional information.  Pt with improved overall speech intelligibility overall judged to be 95% within conversation.  Pt able to recall upcoming tests/plans for rehab (with min verbal cues and questions generating provision of information); no family present this session, but pt did state "my daughter is concerned about me returning home."  Pt able to give vague information re: responsibilities at home with finances, ADLs and simple problem solving with min-mod cues required for details.  Continued ST recommended for cognitive impairment.  Speech has improved and does not need to be targeted as a goal currently.  Pt does realize he needs rehab prior to returning home without prompting, although awareness for specific deficits were vague.  ST will continue to f/u during acute stay for cognition.    HPI HPI: Pt is a 66 y.o. male who presented to the [facility] ED with complaints of 1 week history of slurred speech, falls.  MRI (06/03/21) showed acute to subacute nonhemorrhagic right pontine infarct, scattered nonhemorrhagic infarcts involving the cortical and subcortical aspect of the left frontoparietal and occipital lobe concerning for watershed distribution. Failed yale 06/04/21. Passed 06/05/21. Transferred to [facility] for stroke evaluation per neuro recommendation. PMH: tobacco abuse, prior stroke with no residual deficits, hepatitis C.      SLP Plan  Continue with current plan of care      Recommendations for follow up therapy are one component of a multi-disciplinary discharge planning process, led by the  attending physician.  Recommendations may be updated based on patient status, additional functional criteria and insurance authorization.    Recommendations                   Follow Up Recommendations: Skilled nursing-short term rehab (<3 hours/day) Assistance recommended at discharge: Frequent or constant Supervision/Assistance SLP Visit Diagnosis: Cognitive communication deficit [id number]) Plan: Continue with current plan of care           Elvina Sidle, M.S., [place]  06/07/2021, 3:25 PM

## 2021-06-07 NOTE — TOC Progression Note (Signed)
Transition of Care [facility]) - Progression Note    Patient Details  Name: Ryan Montgomery MRN: [id number] Date of Birth: July 20, 1955  Transition of Care [facility]) CM/SW Contact  Carley Hammed, [place] Phone Number: 06/07/2021, 2:50 PM  Clinical Narrative:     CSW was [facility] that [facility] has been approved for a 32 day [facility] for [facility]. Darl Pikes is the contact when one accepts [phone]).  [facility] [phone]) may have a bed next week, fax # [phone]. Admissions is Joann.  [facility] [phone]) a VM was left to confirm bed availability, referral sent. [facility] [employer] [facility] [phone]) left a VM to confirm bed availability, referral sent.  [facility] [phone]) Fax # [phone], Fax confirmed on VM, VM left to confirm bed availability, referral sent.Marland Kitchen   [place], referral sent in hub.  [facility] [phone]). VM left to confirm bed availability, referral sent.  [facility] [phone]) VM  left to confirm bed availability, referral sent.   [facility] [phone]). VM left to confirm bed availability, referral sent.  CSW unable to leave VM to notify daughter [name]. TOC will continue to follow for DC needs.    Expected Discharge Plan: Skilled Nursing Facility Barriers to Discharge: Continued Medical Work up, Unsafe home situation  Expected Discharge Plan and Services Expected Discharge Plan: Skilled Nursing Facility In-house Referral: Clinical Social Work   Post Acute Care Choice: Skilled Nursing Facility Living arrangements for the past 2 months: Apartment                                       Social Determinants of Health (SDOH) Interventions    Readmission Risk Interventions Readmission Risk Prevention Plan 11/29/2019  Post Dischage Appt Complete  Medication Screening Complete  Transportation Screening Complete   Some recent data might be hidden

## 2021-06-07 NOTE — Progress Notes (Addendum)
STROKE TEAM PROGRESS NOTE   SUBJECTIVE (INTERVAL HISTORY) No family is at the bedside.  Overall his condition continues to improve. Discussed with patient that his TEE and loop recorder placement would be scheduled for Monday. Patient was agreeable to this. Patient had no other questions at this time.    OBJECTIVE Temp:  [97.7 F (36.5 C)-98.7 F (37.1 C)] 98.4 F (36.9 C) (01/13 1112) Pulse Rate:  [74-91] 82 (01/13 1112) Cardiac Rhythm: Normal sinus rhythm (01/13 0955) Resp:  [12-24] 17 (01/13 1112) BP: (126-152)/(74-91) 129/74 (01/13 1112) SpO2:  [97 %-100 %] 97 % (01/13 1354)  No results for input(s): GLUCAP in the last 168 hours. Recent Labs  Lab 06/03/21 1633 06/04/21 0418 06/05/21 0603  NA 141 139 144  K 4.0 3.7 3.5  CL 109 108 112*  CO2 26 25 23   GLUCOSE 107* 106* 85  BUN 19 16 10   CREATININE 2.13* 1.47* 1.11  CALCIUM 9.0 8.4* 8.5*    Recent Labs  Lab 06/03/21 1842  AST 28  ALT 22  ALKPHOS 65  BILITOT 0.8  PROT 8.3*  ALBUMIN 4.1    Recent Labs  Lab 06/03/21 1633  WBC 10.3  NEUTROABS 4.6  HGB 14.2  HCT 43.4  MCV 100.5*  PLT 183    No results for input(s): CKTOTAL, CKMB, CKMBINDEX, TROPONINI in the last 168 hours. No results for input(s): LABPROT, INR in the last 72 hours. No results for input(s): COLORURINE, LABSPEC, [place], GLUCOSEU, HGBUR, BILIRUBINUR, KETONESUR, PROTEINUR, UROBILINOGEN, NITRITE, LEUKOCYTESUR in the last 72 hours.  Invalid input(s): APPERANCEUR      Component Value Date/Time   CHOL 117 06/05/2021 0328   TRIG 104 06/05/2021 0328   HDL 33 (L) 06/05/2021 0328   CHOLHDL 3.5 06/05/2021 0328   VLDL 21 06/05/2021 0328   LDLCALC 63 06/05/2021 0328   Lab Results  Component Value Date   HGBA1C 4.7 (L) 06/05/2021      Component Value Date/Time   LABOPIA NONE DETECTED 06/04/2021 0030   COCAINSCRNUR NONE DETECTED 06/04/2021 0030   LABBENZ NONE DETECTED 06/04/2021 0030   AMPHETMU NONE DETECTED 06/04/2021 0030   THCU NONE  DETECTED 06/04/2021 0030   LABBARB NONE DETECTED 06/04/2021 0030    Recent Labs  Lab 06/03/21 1842  ETH <10     I have personally reviewed the radiological images below and agree with the radiology interpretations.  DG Chest 2 View  Result Date: 06/03/2021 CLINICAL DATA:  Intermittent slurred speech with frequent falls and confusion EXAM: CHEST - 2 VIEW COMPARISON:  Chest x-ray 12/28/2020, CT 09/09/2020 FINDINGS: The heart size and mediastinal contours are within normal limits. Both lungs are clear. Severe chronic compression fracture at T12. IMPRESSION: No active cardiopulmonary disease. Electronically Signed   By: Donavan Foil M.D.   On: 06/03/2021 15:49   CT Head Wo Contrast  Result Date: 06/03/2021 CLINICAL DATA:  Mental status change. EXAM: CT HEAD WITHOUT CONTRAST TECHNIQUE: Contiguous axial images were obtained from the base of the skull through the vertex without intravenous contrast. COMPARISON:  11/27/2018 FINDINGS: Brain: No acute intracranial hemorrhage. No focal mass lesion. No CT evidence of acute infarction. No midline shift or mass effect. No hydrocephalus. Basilar cisterns are patent. Remote small RIGHT frontal cortical and subcortical infarction. Extensive periventricular and subcortical white matter hypodensities. Generalized cortical atrophy. Vascular: No hyperdense vessel or unexpected calcification. Skull: Normal. Negative for fracture or focal lesion. Sinuses/Orbits: Paranasal sinuses and mastoid air cells are clear. Orbits are clear. Other: None. IMPRESSION: 1.  No acute intracranial findings. 2. 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 3. Remote RIGHT frontal infarction. Electronically Signed   By: Suzy Bouchard M.D.   On: 06/03/2021 16:34   MR ANGIO HEAD WO CONTRAST  Result Date: 06/04/2021 CLINICAL DATA:  Initial evaluation for acute stroke. EXAM: MRA NECK WITHOUT AND WITH CONTRAST MRA HEAD WITHOUT CONTRAST TECHNIQUE: Multiplanar and multiecho  pulse sequences of the neck were obtained without and with intravenous contrast. Angiographic images of the neck were obtained using MRA technique without and with intravenous contrast; Angiographic images of the Circle of Willis were obtained using MRA technique without intravenous contrast. CONTRAST:  9 cc of Gadavist. COMPARISON:  Previous MRI from earlier the same day. FINDINGS: MRA NECK FINDINGS AORTIC ARCH: Partially visualized aortic arch normal in caliber with normal branch pattern. No stenosis seen about the origin of the great vessels. RIGHT CAROTID SYSTEM: Right common and internal carotid arteries patent without stenosis or dissection. No significant atheromatous narrowing about the right carotid bulb. LEFT CAROTID SYSTEM: Left common and internal carotid arteries patent without stenosis or dissection. No significant atheromatous narrowing about the left carotid wall. VERTEBRAL ARTERIES: Both vertebral arteries arise from the subclavian arteries. Right vertebral artery slightly dominant. No visible proximal subclavian artery stenosis. Vertebral arteries patent without stenosis or evidence for dissection. MRA HEAD FINDINGS ANTERIOR CIRCULATION: Both internal carotid arteries widely patent to the termini without stenosis or other abnormality. A1 segments widely patent. Normal anterior communicating artery complex. Anterior cerebral arteries widely patent without stenosis. No M1 stenosis or occlusion. Normal MCA bifurcations. Distal MCA branches well perfused and symmetric. POSTERIOR CIRCULATION: Visualized V4 segments widely patent bilaterally. Right vertebral artery dominant. Partially visualized PICA patent bilaterally. Basilar patent to its distal aspect without stenosis. Superior cerebellar are and posterior cerebral arteries widely patent bilaterally. Small bilateral posterior communicating arteries noted. Anatomic variants: None significant.  No aneurysm. IMPRESSION: Negative MRA of the head and  neck. No large vessel occlusion. No hemodynamically significant or correctable stenosis. Electronically Signed   By: Jeannine Boga M.D.   On: 06/04/2021 00:30   MR Angiogram Neck W or Wo Contrast  Result Date: 06/04/2021 CLINICAL DATA:  Initial evaluation for acute stroke. EXAM: MRA NECK WITHOUT AND WITH CONTRAST MRA HEAD WITHOUT CONTRAST TECHNIQUE: Multiplanar and multiecho pulse sequences of the neck were obtained without and with intravenous contrast. Angiographic images of the neck were obtained using MRA technique without and with intravenous contrast; Angiographic images of the Circle of Willis were obtained using MRA technique without intravenous contrast. CONTRAST:  9 cc of Gadavist. COMPARISON:  Previous MRI from earlier the same day. FINDINGS: MRA NECK FINDINGS AORTIC ARCH: Partially visualized aortic arch normal in caliber with normal branch pattern. No stenosis seen about the origin of the great vessels. RIGHT CAROTID SYSTEM: Right common and internal carotid arteries patent without stenosis or dissection. No significant atheromatous narrowing about the right carotid bulb. LEFT CAROTID SYSTEM: Left common and internal carotid arteries patent without stenosis or dissection. No significant atheromatous narrowing about the left carotid wall. VERTEBRAL ARTERIES: Both vertebral arteries arise from the subclavian arteries. Right vertebral artery slightly dominant. No visible proximal subclavian artery stenosis. Vertebral arteries patent without stenosis or evidence for dissection. MRA HEAD FINDINGS ANTERIOR CIRCULATION: Both internal carotid arteries widely patent to the termini without stenosis or other abnormality. A1 segments widely patent. Normal anterior communicating artery complex. Anterior cerebral arteries widely patent without stenosis. No M1 stenosis or occlusion. Normal MCA bifurcations. Distal MCA  branches well perfused and symmetric. POSTERIOR CIRCULATION: Visualized V4 segments widely  patent bilaterally. Right vertebral artery dominant. Partially visualized PICA patent bilaterally. Basilar patent to its distal aspect without stenosis. Superior cerebellar are and posterior cerebral arteries widely patent bilaterally. Small bilateral posterior communicating arteries noted. Anatomic variants: None significant.  No aneurysm. IMPRESSION: Negative MRA of the head and neck. No large vessel occlusion. No hemodynamically significant or correctable stenosis. Electronically Signed   By: Jeannine Boga M.D.   On: 06/04/2021 00:30   MR Brain Wo Contrast (neuro protocol)  Result Date: 06/03/2021 CLINICAL DATA:  Initial evaluation for neuro deficit, stroke suspected./ EXAM: MRI HEAD WITHOUT CONTRAST TECHNIQUE: Multiplanar, multiecho pulse sequences of the brain and surrounding structures were obtained without intravenous contrast. COMPARISON:  Head CT from earlier the same day. FINDINGS: Brain: Cerebral volume within normal limits for age. Patchy and confluent T2/FLAIR hyperintensity involving the periventricular deep white matter both cerebral hemispheres most consistent with chronic small vessel ischemic disease, moderate to advanced in nature. Multiple scattered remote lacunar infarcts present about the bilateral basal ganglia/hemispheric cerebral white matter. Chronic right MCA territory infarct noted at the high right frontal lobe. Additional chronic left cerebellar infarcts noted. Additional small areas of encephalomalacia and gliosis involving the anterior frontal poles favored to be related to remote trauma. 12 mm acute ischemic infarcts seen involving the right pons (series 5, image 62). Additional scattered acute ischemic infarcts seen involving the cortical and subcortical aspect of the left frontal, parietal, and occipital regions, consistent with acute ischemic infarcts as well (series 5, image 84). These are watershed in distribution. No associated hemorrhage or significant mass effect  about these areas of infarction. Otherwise, gray-white matter differentiation maintained. No areas of acute or chronic intracranial hemorrhage. No mass lesion, midline shift or mass effect. No hydrocephalus or extra-axial fluid collection. Pituitary gland suprasellar region normal. Midline structures intact. Vascular: Major intracranial vascular flow voids are maintained. Skull and upper cervical spine: Craniocervical junction within normal limits. Bone marrow signal intensity normal. No scalp soft tissue abnormality. Sinuses/Orbits: Globes and orbital soft tissues demonstrate no acute finding. Mild scattered mucosal thickening noted within the ethmoidal air cells. Paranasal sinuses are otherwise clear. Trace right mastoid effusion noted, of doubtful significance. Other: None. IMPRESSION: 1. 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 2. Additional scattered acute ischemic nonhemorrhagic infarcts involving the cortical and subcortical aspect of the left frontal, parietal, and occipital lobes. These are watershed in distribution. 3. Underlying moderate to advanced chronic microvascular ischemic disease with multiple remote infarcts involving the right frontal lobe, bilateral basal ganglia/hemispheric cerebral white matter, left cerebellum. 4. Additional small areas of encephalomalacia involving the anterior frontal lobes bilaterally, likely related to remote trauma. Electronically Signed   By: Jeannine Boga M.D.   On: 06/03/2021 20:09   ECHOCARDIOGRAM COMPLETE  Result Date: 06/04/2021    ECHOCARDIOGRAM REPORT   Patient Name:   RIVEN SQUICCIARINI Date of Exam: 06/04/2021 Medical Rec #:  [id number]       Height:       71.0 in Accession #:    [id number]      Weight:       180.0 lb Date of Birth:  March 03, 1956        BSA:          2.016 m Patient Age:    35 years        BP:           133/68 mmHg Patient Gender: M  HR:           58 bpm. Exam Location:  Inpatient Procedure: 2D Echo, Cardiac  Doppler and Color Doppler Indications:    Stroke  History:        Patient has prior history of Echocardiogram examinations.  Sonographer:    Jyl Heinz Referring Phys: Wallace [place]  1. Left ventricular ejection fraction, by estimation, is 55 to 60%. The left ventricle has normal function. The left ventricle has no regional wall motion abnormalities. There is mild concentric left ventricular hypertrophy. Left ventricular diastolic parameters were normal.  2. Right ventricular systolic function is normal. The right ventricular size is normal. There is normal pulmonary artery systolic pressure.  3. Left atrial size was mildly dilated.  4. The mitral valve is normal in structure. Trivial mitral valve regurgitation. No evidence of mitral stenosis.  5. The aortic valve is tricuspid. Aortic valve regurgitation is mild. No aortic stenosis is present.  6. Aortic dilatation noted. There is moderate dilatation of the aortic root, measuring 44 mm.  7. The inferior vena cava is normal in size with greater than 50% respiratory variability, suggesting right atrial pressure of 3 mmHg. Comparison(s): No significant change from prior study. Prior images reviewed side by side. FINDINGS  Left Ventricle: Left ventricular ejection fraction, by estimation, is 55 to 60%. The left ventricle has normal function. The left ventricle has no regional wall motion abnormalities. The left ventricular internal cavity size was normal in size. There is  mild concentric left ventricular hypertrophy. Left ventricular diastolic parameters were normal. Normal left ventricular filling pressure. Right Ventricle: The right ventricular size is normal. No increase in right ventricular wall thickness. Right ventricular systolic function is normal. There is normal pulmonary artery systolic pressure. The tricuspid regurgitant velocity is 2.50 m/s, and  with an assumed right atrial pressure of 3 mmHg, the estimated right ventricular  systolic pressure is [id number] mmHg. Left Atrium: Left atrial size was mildly dilated. Right Atrium: Right atrial size was normal in size. Pericardium: There is no evidence of pericardial effusion. Mitral Valve: The mitral valve is normal in structure. Trivial mitral valve regurgitation. No evidence of mitral valve stenosis. Tricuspid Valve: The tricuspid valve is normal in structure. Tricuspid valve regurgitation is trivial. No evidence of tricuspid stenosis. Aortic Valve: The aortic valve is tricuspid. Aortic valve regurgitation is mild. Aortic regurgitation PHT measures 605 msec. No aortic stenosis is present. Aortic valve peak gradient measures 3.6 mmHg. Pulmonic Valve: The pulmonic valve was grossly normal. Pulmonic valve regurgitation is mild. No evidence of pulmonic stenosis. Aorta: Aortic dilatation noted. There is moderate dilatation of the aortic root, measuring 44 mm. Venous: The inferior vena cava is normal in size with greater than 50% respiratory variability, suggesting right atrial pressure of 3 mmHg. IAS/Shunts: No atrial level shunt detected by color flow Doppler.  LEFT VENTRICLE PLAX 2D LVIDd:         4.50 cm      Diastology LVIDs:         3.20 cm      LV e' medial:    6.74 cm/s LV PW:         1.20 cm      LV E/e' medial:  11.4 LV IVS:        1.20 cm      LV e' lateral:   9.03 cm/s LVOT diam:     2.30 cm      LV E/e' lateral: 8.5 LV SV:  84 LV SV Index:   42 LVOT Area:     4.15 cm  LV Volumes (MOD) LV vol d, MOD A2C: 123.0 ml LV vol d, MOD A4C: 151.0 ml LV vol s, MOD A2C: 53.0 ml LV vol s, MOD A4C: 63.7 ml LV SV MOD A2C:     70.0 ml LV SV MOD A4C:     151.0 ml LV SV MOD BP:      77.8 ml RIGHT VENTRICLE             IVC RV Basal diam:  3.80 cm     IVC diam: 2.00 cm RV Mid diam:    3.50 cm RV S prime:     10.30 cm/s TAPSE (M-mode): 2.1 cm LEFT ATRIUM             Index        RIGHT ATRIUM           Index LA diam:        3.80 cm 1.88 cm/m   RA Area:     12.30 cm LA Vol (A2C):   67.2 ml 33.33 ml/m   RA Volume:   20.60 ml  10.22 ml/m LA Vol (A4C):   64.2 ml 31.84 ml/m LA Biplane Vol: 66.1 ml 32.78 ml/m  AORTIC VALVE AV Area (Vmax): 3.81 cm AV Vmax:        94.70 cm/s AV Peak Grad:   3.6 mmHg LVOT Vmax:      86.80 cm/s LVOT Vmean:     62.100 cm/s LVOT VTI:       0.202 m AI PHT:         605 msec  AORTA Ao Root diam: 4.40 cm Ao Asc diam:  3.50 cm MITRAL VALVE               TRICUSPID VALVE MV Area (PHT): 5.02 cm    TR Peak grad:   25.0 mmHg MV Decel Time: 151 msec    TR Vmax:        250.00 cm/s MV E velocity: 77.10 cm/s MV A velocity: 56.60 cm/s  SHUNTS MV E/A ratio:  1.36        Systemic VTI:  0.20 m                            Systemic Diam: 2.30 cm Rachelle Hora Croitoru MD Electronically signed by Thurmon Fair MD Signature Date/Time: 06/04/2021/12:56:21 PM    Final    VAS [place] LOWER EXTREMITY VENOUS (DVT)  Result Date: 06/06/2021  Lower Venous DVT Study Patient Name:  ADRIENNE WINDERS  Date of Exam:   06/06/2021 Medical Rec #: [id number]        Accession #:    [id number] Date of Birth: 01/20/1956         Patient Gender: M Patient Age:   66 years Exam Location:  [facility] Procedure:      VAS [place] LOWER EXTREMITY VENOUS (DVT) Referring Phys: Scheryl Marten Lawrnce Reyez --------------------------------------------------------------------------------  Indications: Stroke.  Comparison Study: No prior study Performing Technologist: Sherren Kerns RVS  Examination Guidelines: A complete evaluation includes B-mode imaging, spectral Doppler, color Doppler, and power Doppler as needed of all accessible portions of each vessel. Bilateral testing is considered an integral part of a complete examination. Limited examinations for reoccurring indications may be performed as noted. The reflux portion of the exam is performed with the patient in reverse Trendelenburg.  +---------+---------------+---------+-----------+----------+--------------+  RIGHT  Compressibility Phasicity Spontaneity Properties Thrombus Aging   +---------+---------------+---------+-----------+----------+--------------+  CFV       Full            Yes       Yes                                    +---------+---------------+---------+-----------+----------+--------------+  SFJ       Full                                                             +---------+---------------+---------+-----------+----------+--------------+  FV Prox   Full                                                             +---------+---------------+---------+-----------+----------+--------------+  FV Mid    Full                                                             +---------+---------------+---------+-----------+----------+--------------+  FV Distal Full                                                             +---------+---------------+---------+-----------+----------+--------------+  PFV       Full                                                             +---------+---------------+---------+-----------+----------+--------------+  POP       Full            Yes       Yes                                    +---------+---------------+---------+-----------+----------+--------------+  PTV       Full                                                             +---------+---------------+---------+-----------+----------+--------------+  PERO      Full                                                             +---------+---------------+---------+-----------+----------+--------------+   +---------+---------------+---------+-----------+----------+--------------+  LEFT      Compressibility Phasicity Spontaneity Properties Thrombus Aging  +---------+---------------+---------+-----------+----------+--------------+  CFV       Full            Yes       Yes                                    +---------+---------------+---------+-----------+----------+--------------+  SFJ       Full                                                              +---------+---------------+---------+-----------+----------+--------------+  FV Prox   Full                                                             +---------+---------------+---------+-----------+----------+--------------+  FV Mid    Full                                                             +---------+---------------+---------+-----------+----------+--------------+  FV Distal Full                                                             +---------+---------------+---------+-----------+----------+--------------+  PFV       Full                                                             +---------+---------------+---------+-----------+----------+--------------+  POP       Full            Yes       Yes                                    +---------+---------------+---------+-----------+----------+--------------+  PTV       Full                                                             +---------+---------------+---------+-----------+----------+--------------+  PERO      Full                                                             +---------+---------------+---------+-----------+----------+--------------+  Summary: BILATERAL: - No evidence of deep vein thrombosis seen in the lower extremities, bilaterally. -No evidence of popliteal cyst, bilaterally.   *See table(s) above for measurements and observations. Electronically signed by Orlie Pollen on 06/06/2021 at 3:56:46 PM.    Final      PHYSICAL EXAM  Temp:  [97.7 F (36.5 C)-98.7 F (37.1 C)] 98.4 F (36.9 C) (01/13 1112) Pulse Rate:  [74-91] 82 (01/13 1112) Resp:  [12-24] 17 (01/13 1112) BP: (126-152)/(74-91) 129/74 (01/13 1112) SpO2:  [97 %-100 %] 97 % (01/13 1354)  General - Well nourished, well developed, in no apparent distress.  Ophthalmologic - fundi not visualized due to noncooperation.  Cardiovascular - Regular rhythm and rate.  Mental Status -  Level of arousal and orientation to time, place, and person were  intact. Language including expression, naming, repetition, comprehension was assessed and found intact. Fund of Knowledge was assessed and was intact.  Cranial Nerves II - XII - II - Visual field intact OU. III, IV, VI - Extraocular movements intact. V - Facial sensation intact bilaterally. VII - Facial movement intact bilaterally. VIII - Hearing & vestibular intact bilaterally. X - Palate elevates symmetrically. XI - Chin turning & shoulder shrug intact bilaterally. XII - Tongue protrusion intact.  Motor Strength - The patients strength was normal in all extremities and pronator drift was absent except mild right hand grip and dexterity difficulty, however patient stated that was chronic.  Bulk was normal and fasciculations were absent.   Motor Tone - Muscle tone was assessed at the neck and appendages and was normal.  Reflexes - The patients reflexes were symmetrical in all extremities and he had no pathological reflexes.  Sensory - Light touch, temperature/pinprick were assessed and were symmetrical.    Coordination - The patient had normal movements in the hands and feet with no ataxia or dysmetria.  Tremor was absent.  Gait and Station - deferred.   ASSESSMENT/PLAN Mr. Ilyan Coriell is a 66 y.o. male with history of smoker, hepatitis C, stroke 3 to 4 years ago without residual admitted for slurred speech, fall at home and confusion. No tPA given due to outside window.    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 MRI right pontine and scattered acute ischemic nonhemorrhagic infarcts involving the cortical and subcortical aspect of the left frontal, parietal, and occipital lobes. These are watershed in distribution MRA head and neck unremarkable LE venous Doppler no DVT 2D Echo EF 55 to 60% LDL 73 HgbA1c 4.7 Hypercoagulable work-up Homocystein 18.0, Lupus  anticoagulant negative, ANA wnl, HIV negative, TSH wnl, B12 wnl, RPR negative, remainder of workup pending Recommended TEE and loop recorder.  Plan to have both completed on Monday. Lovenox for VTE prophylaxis No antithrombotic prior to admission, now on aspirin 81 mg daily and clopidogrel 75 mg daily DAPT for 3 weeks and then aspirin alone. Patient counseled to be compliant with his antithrombotic medications Ongoing aggressive stroke risk factor management Therapy recommendations: SNF Disposition: Pending  History of LOC 11/2019 LOC with possible jerking movement EEG negative Concern for seizure, put on Keppra 500 mg twice daily However Keppra was not on his current medication list No more similar episode since  History of heart palpitation Patient stated that 5 years ago, while he was at work, he had heart fluttering, heart palpitation for 30 minutes.  He did not seek for medical attention at that time  Given currently embolic Stroke, recommend loop recorder to rule out afib  Hypertension stable Long term BP goal normotensive  Lipid management Home meds: None LDL 63, goal < 70 Now on Lipitor 20 No high intensity statin due to LDL at goal Continue statin at discharge  Tobacco abuse Current smoker Smoking cessation counseling provided Nicotine patch provided Pt is willing to quit  Other Stroke Risk Factors Hx stroke/TIA - 3-4 years ago, details not clear, no residual left  Other Active Problems AKI, creatinine 2.13-1.47-1.11 Mild leukocytosis, WBC 10.3  Hospital day # 4   France Ravens, MD PGY1 Resident 06/07/2021 2:45 PM   ATTENDING NOTE: I reviewed above note and agree with the assessment and plan. Pt was seen and examined.   Patient lying in bed, no acute event overnight, neuro stable, unchanged.  Plan to have TEE and loop recorder on Monday.  Confirmed with cardiology.  For detailed assessment and plan, please refer to above as I have made changes wherever  appropriate.   Rosalin Hawking, MD PhD Stroke Neurology 06/07/2021 7:48 PM     To contact Stroke Continuity provider, please refer to [email or web address] After hours, contact General Neurology

## 2021-06-07 NOTE — NC FL2 (Signed)
[place] MEDICAID FL2 LEVEL OF CARE SCREENING TOOL     IDENTIFICATION  Patient Name: Ryan Montgomery Birthdate: Oct 11, 1955 Sex: male Admission Date (Current Location): 06/03/2021  [facility] ([facility]) and [place] Number:  Producer, television/film/video and Address:  [facility], [address], [place], [place] [address]      Provider Number: [id number]  Attending Physician Name and Address:  Burnadette Pop, MD  Relative Name and Phone Number:  Wrenley Weatherman, daughter, [phone]    Current Level of Care: Hospital Recommended Level of Care: Skilled Nursing Facility Prior Approval Number:    Date Approved/Denied:   PASRR Number: [id number]  Discharge Plan: SNF    Current Diagnoses: Patient Active Problem List   Diagnosis Date Noted   Aortic dilatation (HCC) 06/04/2021   Dysphagia 06/04/2021   Acute CVA (cerebrovascular accident) (HCC) 06/03/2021   AKI (acute kidney injury) (HCC) 06/03/2021   Tobacco use 06/03/2021   Chronic pain in left foot 05/14/2021   Rib fractures 09/09/2020   Chronic left hip pain 12/09/2019   Seizure disorder (HCC)    Syncope 11/27/2019   History of CVA (cerebrovascular accident) 2018   Hepatitis C 07/18/2013    Orientation RESPIRATION BLADDER Height & Weight     Self, Time, Place, Situation  Normal Continent, External catheter Weight: 180 lb 5.4 oz (81.8 kg) Height:  5\' 11"  (180.3 cm)  BEHAVIORAL SYMPTOMS/MOOD NEUROLOGICAL BOWEL NUTRITION STATUS      Continent Diet (See DC summary)  AMBULATORY STATUS COMMUNICATION OF NEEDS Skin   Extensive Assist Verbally Normal                       Personal Care Assistance Level of Assistance  Bathing, Feeding, Dressing Bathing Assistance: Limited assistance Feeding assistance: Independent Dressing Assistance: Limited assistance     Functional Limitations Info  Sight, Hearing, Speech Sight Info: Adequate Hearing Info: Adequate Speech Info: Adequate    SPECIAL CARE FACTORS  FREQUENCY  PT (By licensed PT), OT (By licensed OT)     PT Frequency: 5x week OT Frequency: 5x week            Contractures Contractures Info: Not present    Additional Factors Info  Code Status, Allergies Code Status Info: Full Allergies Info: Moxifloxacin           Current Medications (06/07/2021):  This is the current hospital active medication list Current Facility-Administered Medications  Medication Dose Route Frequency Provider Last Rate Last Admin   acetaminophen (TYLENOL) tablet 650 mg  650 mg Oral Q6H PRN John Giovanni, MD       Or   acetaminophen (TYLENOL) suppository 650 mg  650 mg Rectal Q6H PRN John Giovanni, MD       amLODipine (NORVASC) tablet 5 mg  5 mg Oral Daily Burnadette Pop, MD   5 mg at 06/07/21 8469   aspirin EC tablet 81 mg  81 mg Oral Daily Marvel Plan, MD   81 mg at 06/07/21 6295   atorvastatin (LIPITOR) tablet 20 mg  20 mg Oral Daily Park Pope, MD   20 mg at 06/07/21 2841   clopidogrel (PLAVIX) tablet 75 mg  75 mg Oral Daily Milon Dikes, MD   75 mg at 06/07/21 0922   enoxaparin (LOVENOX) injection 40 mg  40 mg Subcutaneous Q24H John Giovanni, MD   40 mg at 06/07/21 3244   labetalol (NORMODYNE) injection 10 mg  10 mg Intravenous Q4H PRN Carollee Herter, DO  nicotine (NICODERM CQ - dosed in mg/24 hours) patch 14 mg  14 mg Transdermal Daily Shela Leff, MD   14 mg at 06/07/21 [id number]   tamsulosin (FLOMAX) capsule 0.4 mg  0.4 mg Oral Daily Shelly Coss, MD   0.4 mg at 06/07/21 [id number]     Discharge Medications: Please see discharge summary for a list of discharge medications.  Relevant Imaging Results:  Relevant Lab Results:   Additional Information SS# [id number]. 32 day [facility] contract for SNF.  Coralee Pesa, LCSWA

## 2021-06-07 NOTE — Plan of Care (Signed)
?  Problem: Education: ?Goal: Knowledge of disease or condition will improve ?Outcome: Progressing ?Goal: Knowledge of secondary prevention will improve (SELECT ALL) ?Outcome: Progressing ?Goal: Knowledge of patient specific risk factors will improve (INDIVIDUALIZE FOR PATIENT) ?Outcome: Progressing ?  ?

## 2021-06-08 ENCOUNTER — Inpatient Hospital Stay (HOSPITAL_COMMUNITY): Payer: No Typology Code available for payment source

## 2021-06-08 DIAGNOSIS — I639 Cerebral infarction, unspecified: Secondary | ICD-10-CM

## 2021-06-08 LAB — BETA-2-GLYCOPROTEIN I ABS, IGG/M/A
Beta-2 Glyco I IgG: <9 GPI IgG units (ref 0–20)
Beta-2-Glycoprotein I IgA: <9 GPI IgA units (ref 0–25)
Beta-2-Glycoprotein I IgM: <9 GPI IgM units (ref 0–32)

## 2021-06-08 NOTE — Plan of Care (Signed)
  Problem: Education: Goal: Knowledge of disease or condition will improve Outcome: Progressing Goal: Knowledge of secondary prevention will improve (SELECT ALL) Outcome: Progressing   Problem: Health Behavior/Discharge Planning: Goal: Ability to manage health-related needs will improve Outcome: Progressing   Problem: Self-Care: Goal: Ability to participate in self-care as condition permits will improve Outcome: Progressing   Problem: Ischemic Stroke/TIA Tissue Perfusion: Goal: Complications of ischemic stroke/TIA will be minimized Outcome: Progressing   

## 2021-06-08 NOTE — Progress Notes (Signed)
VASCULAR LAB    TCD with bubbles has been performed.  See CV proc for preliminary results.   Kielee Care, RVT 06/08/2021, 4:40 PM

## 2021-06-08 NOTE — Progress Notes (Signed)
PROGRESS NOTE    Ryan Montgomery  OIL:[id number] DOB: 20-Feb-1956 DOA: 06/03/2021 PCP: Nelwyn Salisbury, MD   Chief Complain: Patient is a 66 year old male with history of tobacco abuse, prior stroke with no residual deficits, hepatitis C who presented to the emergency department with complaints of 1 week history of slurred speech, falls.  MRI on presentation showed acute to subacute nonhemorrhagic right pontine infarct, scattered nonhemorrhagic infarcts involving the cortical and subcortical aspect of the left frontoparietal and occipital lobe concerning for watershed distribution.  MRA of the head and neck did not show any significant stenosis.  Suspicion for cardioembolic etiology.  Neurology following.  Plan for TEE/loop recorder placement.  PT/OT evaluation done,recommended SNF.  Brief Narrative:   Assessment & Plan:   Principal Problem:   Acute CVA (cerebrovascular accident) (HCC) Active Problems:   AKI (acute kidney injury) (HCC)   Tobacco use   Aortic dilatation (HCC)   Dysphagia   Acute ischemic stroke: Presented with 1 week history of slurred speech, falls, weakness.MRI on presentation showed acute to subacute nonhemorrhagic right pontine infarct, scattered nonhemorrhagic infarcts involving the cortical and subcortical aspect of the left frontoparietal and occipital lobe concerning for watershed distribution.  MRA of the head and neck did not show any significant stenosis.  Suspicion for cardioembolic etiology.  Neurology following.  Plan for TEE/loop recorder placement,will be done on Monday.  PT/OT/speech done, recommended skilled nursing facility on discharge. LDL of 63.  Hemoglobin A1c of 4.7.  Continue aspirin 81 mg and Plavix for now.  Started on Lipitor 20 mg daily. Venous Doppler  ruled out DVT Echo showed EF of 55 to 60%, no regional wall motion, mild dilated left atrial size no intra-atrial shunt  AKI: resolved.  IV fluid discontinued  Aortic dilation: Seen on  echocardiogram.  Recommend outpatient follow-up with cardiology.  Tobacco use: Continue nicotine patch.  Counseled for cessation  History of hepatitis C :status post treatment  Hypertension: Blood pressure stable now . On amlodipine 5 mg daily  Debility/deconditioning: PT/OT recommended skilled NF  on discharge.        DVT prophylaxis:Lovenox Code Status: Full Family Communication: None at the bedside Patient status:Inpatient  Dispo: The patient is from: Home              Anticipated d/c is to:  SNF               Anticipated d/c date is: Pending TEE, loop recorder  Procedures: None  Antimicrobials:  Anti-infectives (From admission, onward)    None       Subjective:  Patient seen and examined the bedside this morning.  Hemodynamically stable.  Comfortable.  No new complaints   Objective: Vitals:   06/07/21 1500 06/07/21 2046 06/07/21 2342 06/08/21 0331  BP: 129/84 136/81 126/72 126/73  Pulse: 90 77 80 75  Resp: 20 20 20 18   Temp: (!) 97.5 F (36.4 C) 97.7 F (36.5 C) 97.9 F (36.6 C) 97.8 F (36.6 C)  TempSrc: Oral Oral Oral Oral  SpO2: 96% 99% 97% 98%  Weight:      Height:        Intake/Output Summary (Last 24 hours) at 06/08/2021 0732 Last data filed at 06/07/2021 1817 Gross per 24 hour  Intake 947 ml  Output 1100 ml  Net -153 ml   Filed Weights   06/03/21 1504 06/04/21 2200  Weight: 81.6 kg 81.8 kg    Examination:  General exam: Overall comfortable, not in distress HEENT: PERRL Respiratory system:  no wheezes or crackles  Cardiovascular system: S1 & S2 heard, RRR.  Gastrointestinal system: Abdomen is nondistended, soft and nontender. Central nervous system: Alert and oriented Extremities: No edema, no clubbing ,no cyanosis Skin: No rashes, no ulcers,no icterus     Data Reviewed: I have personally reviewed following labs and imaging studies  CBC: Recent Labs  Lab 06/03/21 1633  WBC 10.3  NEUTROABS 4.6  HGB 14.2  HCT 43.4  MCV  100.5*  PLT [id number]   Basic Metabolic Panel: Recent Labs  Lab 06/03/21 1633 06/04/21 0418 06/05/21 0603  NA 141 139 144  K 4.0 3.7 3.5  CL 109 108 112*  CO2 26 25 23   GLUCOSE 107* 106* 85  BUN 19 16 10   CREATININE 2.13* 1.47* 1.11  CALCIUM 9.0 8.4* 8.5*   GFR: Estimated Creatinine Clearance: 71.6 mL/min (by C-G formula based on SCr of 1.11 mg/dL). Liver Function Tests: Recent Labs  Lab 06/03/21 1842  AST 28  ALT 22  ALKPHOS 65  BILITOT 0.8  PROT 8.3*  ALBUMIN 4.1   No results for input(s): LIPASE, AMYLASE in the last 168 hours. No results for input(s): AMMONIA in the last 168 hours. Coagulation Profile: No results for input(s): INR, PROTIME in the last 168 hours. Cardiac Enzymes: No results for input(s): CKTOTAL, CKMB, CKMBINDEX, TROPONINI in the last 168 hours. BNP (last 3 results) No results for input(s): PROBNP in the last 8760 hours. HbA1C: No results for input(s): HGBA1C in the last 72 hours.  CBG: No results for input(s): GLUCAP in the last 168 hours. Lipid Profile: No results for input(s): CHOL, HDL, LDLCALC, TRIG, CHOLHDL, LDLDIRECT in the last 72 hours.  Thyroid Function Tests: No results for input(s): TSH, T4TOTAL, FREET4, T3FREE, THYROIDAB in the last 72 hours.  Anemia Panel: No results for input(s): VITAMINB12, FOLATE, FERRITIN, TIBC, IRON, RETICCTPCT in the last 72 hours.  Sepsis Labs: No results for input(s): PROCALCITON, LATICACIDVEN in the last 168 hours.  Recent Results (from the past 240 hour(s))  Resp Panel by RT-PCR (Flu A&B, Covid) Nasopharyngeal Swab     Status: None   Collection Time: 06/03/21  6:42 PM   Specimen: Nasopharyngeal Swab; Nasopharyngeal(NP) swabs in vial transport medium  Result Value Ref Range Status   SARS Coronavirus 2 by RT PCR NEGATIVE NEGATIVE Final    Comment: (NOTE) SARS-CoV-2 target nucleic acids are NOT DETECTED.  The SARS-CoV-2 RNA is generally detectable in upper respiratory specimens during the acute phase  of infection. The lowest concentration of SARS-CoV-2 viral copies this assay can detect is 138 copies/mL. A negative result does not preclude SARS-Cov-2 infection and should not be used as the sole basis for treatment or other patient management decisions. A negative result may occur with  improper specimen collection/handling, submission of specimen other than nasopharyngeal swab, presence of viral mutation(s) within the areas targeted by this assay, and inadequate number of viral copies(<138 copies/mL). A negative result must be combined with clinical observations, patient history, and epidemiological information. The expected result is Negative.  Fact Sheet for Patients:  EntrepreneurPulse.com.au  Fact Sheet for Healthcare Providers:  IncredibleEmployment.be  This test is no t yet approved or cleared by the [place] FDA and  has been authorized for detection and/or diagnosis of SARS-CoV-2 by FDA under an Emergency Use Authorization (EUA). This EUA will remain  in effect (meaning this test can be used) for the duration of the COVID-19 declaration under Section 564(b)(1) of the Act, 21 U.S.C.section 360bbb-3(b)(1), unless the authorization is  terminated  or revoked sooner.       Influenza A by PCR NEGATIVE NEGATIVE Final   Influenza B by PCR NEGATIVE NEGATIVE Final    Comment: (NOTE) The Xpert Xpress SARS-CoV-2/FLU/RSV plus assay is intended as an aid in the diagnosis of influenza from Nasopharyngeal swab specimens and should not be used as a sole basis for treatment. Nasal washings and aspirates are unacceptable for Xpert Xpress SARS-CoV-2/FLU/RSV testing.  Fact Sheet for Patients: EntrepreneurPulse.com.au  Fact Sheet for Healthcare Providers: IncredibleEmployment.be  This test is not yet approved or cleared by the [place] FDA and has been authorized for detection and/or diagnosis of  SARS-CoV-2 by FDA under an Emergency Use Authorization (EUA). This EUA will remain in effect (meaning this test can be used) for the duration of the COVID-19 declaration under Section 564(b)(1) of the Act, 21 U.S.C. section 360bbb-3(b)(1), unless the authorization is terminated or revoked.  Performed at [facility], [place] [address], [place], [place] [address]          Radiology Studies: VAS [place] LOWER EXTREMITY VENOUS (DVT)  Result Date: 06/06/2021  Lower Venous DVT Study Patient Name:  BRISEN GAFFEY  Date of Exam:   06/06/2021 Medical Rec #: [id number]        Accession #:    [id number] Date of Birth: March 18, 1956         Patient Gender: M Patient Age:   51 years Exam Location:  [facility] Procedure:      VAS [place] LOWER EXTREMITY VENOUS (DVT) Referring Phys: Cornelius Moras XU --------------------------------------------------------------------------------  Indications: Stroke.  Comparison Study: No prior study Performing Technologist: Sharion Dove RVS  Examination Guidelines: A complete evaluation includes B-mode imaging, spectral Doppler, color Doppler, and power Doppler as needed of all accessible portions of each vessel. Bilateral testing is considered an integral part of a complete examination. Limited examinations for reoccurring indications may be performed as noted. The reflux portion of the exam is performed with the patient in reverse Trendelenburg.  +---------+---------------+---------+-----------+----------+--------------+  RIGHT     Compressibility Phasicity Spontaneity Properties Thrombus Aging  +---------+---------------+---------+-----------+----------+--------------+  CFV       Full            Yes       Yes                                    +---------+---------------+---------+-----------+----------+--------------+  SFJ       Full                                                             +---------+---------------+---------+-----------+----------+--------------+   FV Prox   Full                                                             +---------+---------------+---------+-----------+----------+--------------+  FV Mid    Full                                                             +---------+---------------+---------+-----------+----------+--------------+  FV Distal Full                                                             +---------+---------------+---------+-----------+----------+--------------+  PFV       Full                                                             +---------+---------------+---------+-----------+----------+--------------+  POP       Full            Yes       Yes                                    +---------+---------------+---------+-----------+----------+--------------+  PTV       Full                                                             +---------+---------------+---------+-----------+----------+--------------+  PERO      Full                                                             +---------+---------------+---------+-----------+----------+--------------+   +---------+---------------+---------+-----------+----------+--------------+  LEFT      Compressibility Phasicity Spontaneity Properties Thrombus Aging  +---------+---------------+---------+-----------+----------+--------------+  CFV       Full            Yes       Yes                                    +---------+---------------+---------+-----------+----------+--------------+  SFJ       Full                                                             +---------+---------------+---------+-----------+----------+--------------+  FV Prox   Full                                                             +---------+---------------+---------+-----------+----------+--------------+  FV Mid    Full                                                             +---------+---------------+---------+-----------+----------+--------------+  FV Distal Full                                                              +---------+---------------+---------+-----------+----------+--------------+  PFV       Full                                                             +---------+---------------+---------+-----------+----------+--------------+  POP       Full            Yes       Yes                                    +---------+---------------+---------+-----------+----------+--------------+  PTV       Full                                                             +---------+---------------+---------+-----------+----------+--------------+  PERO      Full                                                             +---------+---------------+---------+-----------+----------+--------------+     Summary: BILATERAL: - No evidence of deep vein thrombosis seen in the lower extremities, bilaterally. -No evidence of popliteal cyst, bilaterally.   *See table(s) above for measurements and observations. Electronically signed by Orlie Pollen on 06/06/2021 at 3:56:46 PM.    Final         Scheduled Meds:  amLODipine  5 mg Oral Daily   aspirin EC  81 mg Oral Daily   atorvastatin  20 mg Oral Daily   clopidogrel  75 mg Oral Daily   enoxaparin (LOVENOX) injection  40 mg Subcutaneous Q24H   nicotine  14 mg Transdermal Daily   tamsulosin  0.4 mg Oral Daily   Continuous Infusions:  [START ON 06/10/2021] sodium chloride        LOS: 5 days    Time spent: 35 mins.More than 50% of that time was spent in counseling and/or coordination of care.      Shelly Coss, MD [facility] P1/14/2023, 7:32 AM

## 2021-06-08 NOTE — Plan of Care (Signed)
  Problem: Education: Goal: Knowledge of disease or condition will improve Outcome: Progressing Goal: Knowledge of secondary prevention will improve (SELECT ALL) Outcome: Progressing Goal: Knowledge of patient specific risk factors will improve (INDIVIDUALIZE FOR PATIENT) Outcome: Progressing   Problem: Ischemic Stroke/TIA Tissue Perfusion: Goal: Complications of ischemic stroke/TIA will be minimized Outcome: Progressing   

## 2021-06-08 NOTE — Progress Notes (Signed)
STROKE TEAM PROGRESS NOTE   SUBJECTIVE (INTERVAL HISTORY) No family is at the bedside.  .  Sitting up comfortably in bed.  No complaints.  Discussed doing transcranial Doppler bubble study for PFO and patient is agreeable.  TEE scheduled for Monday.  Vital signs stable.  Neurological exam unchanged   OBJECTIVE Temp:  [97.5 F (36.4 C)-98.4 F (36.9 C)] 98.4 F (36.9 C) (01/14 0838) Pulse Rate:  [75-90] 75 (01/14 0838) Cardiac Rhythm: Normal sinus rhythm (01/14 0700) Resp:  [18-20] 18 (01/14 0838) BP: (126-136)/(72-84) 133/79 (01/14 0838) SpO2:  [96 %-99 %] 97 % (01/14 0838)  No results for input(s): GLUCAP in the last 168 hours. Recent Labs  Lab 06/03/21 1633 06/04/21 0418 06/05/21 0603  NA 141 139 144  K 4.0 3.7 3.5  CL 109 108 112*  CO2 26 25 23   GLUCOSE 107* 106* 85  BUN 19 16 10   CREATININE 2.13* 1.47* 1.11  CALCIUM 9.0 8.4* 8.5*   Recent Labs  Lab 06/03/21 1842  AST 28  ALT 22  ALKPHOS 65  BILITOT 0.8  PROT 8.3*  ALBUMIN 4.1   Recent Labs  Lab 06/03/21 1633  WBC 10.3  NEUTROABS 4.6  HGB 14.2  HCT 43.4  MCV 100.5*  PLT 183   No results for input(s): CKTOTAL, CKMB, CKMBINDEX, TROPONINI in the last 168 hours. No results for input(s): LABPROT, INR in the last 72 hours. No results for input(s): COLORURINE, LABSPEC, [place], GLUCOSEU, HGBUR, BILIRUBINUR, KETONESUR, PROTEINUR, UROBILINOGEN, NITRITE, LEUKOCYTESUR in the last 72 hours.  Invalid input(s): APPERANCEUR      Component Value Date/Time   CHOL 117 06/05/2021 0328   TRIG 104 06/05/2021 0328   HDL 33 (L) 06/05/2021 0328   CHOLHDL 3.5 06/05/2021 0328   VLDL 21 06/05/2021 0328   LDLCALC 63 06/05/2021 0328   Lab Results  Component Value Date   HGBA1C 4.7 (L) 06/05/2021      Component Value Date/Time   LABOPIA NONE DETECTED 06/04/2021 0030   COCAINSCRNUR NONE DETECTED 06/04/2021 0030   LABBENZ NONE DETECTED 06/04/2021 0030   AMPHETMU NONE DETECTED 06/04/2021 0030   THCU NONE DETECTED  06/04/2021 0030   LABBARB NONE DETECTED 06/04/2021 0030    Recent Labs  Lab 06/03/21 1842  ETH <10    I have personally reviewed the radiological images below and agree with the radiology interpretations.  DG Chest 2 View  Result Date: 06/03/2021 CLINICAL DATA:  Intermittent slurred speech with frequent falls and confusion EXAM: CHEST - 2 VIEW COMPARISON:  Chest x-ray 12/28/2020, CT 09/09/2020 FINDINGS: The heart size and mediastinal contours are within normal limits. Both lungs are clear. Severe chronic compression fracture at T12. IMPRESSION: No active cardiopulmonary disease. Electronically Signed   By: Donavan Foil M.D.   On: 06/03/2021 15:49   CT Head Wo Contrast  Result Date: 06/03/2021 CLINICAL DATA:  Mental status change. EXAM: CT HEAD WITHOUT CONTRAST TECHNIQUE: Contiguous axial images were obtained from the base of the skull through the vertex without intravenous contrast. COMPARISON:  11/27/2018 FINDINGS: Brain: No acute intracranial hemorrhage. No focal mass lesion. No CT evidence of acute infarction. No midline shift or mass effect. No hydrocephalus. Basilar cisterns are patent. Remote small RIGHT frontal cortical and subcortical infarction. Extensive periventricular and subcortical white matter hypodensities. Generalized cortical atrophy. Vascular: No hyperdense vessel or unexpected calcification. Skull: Normal. Negative for fracture or focal lesion. Sinuses/Orbits: Paranasal sinuses and mastoid air cells are clear. Orbits are clear. Other: None. IMPRESSION: 1. No acute intracranial  findings. 2. 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 3. Remote RIGHT frontal infarction. Electronically Signed   By: Suzy Bouchard M.D.   On: 06/03/2021 16:34   MR ANGIO HEAD WO CONTRAST  Result Date: 06/04/2021 CLINICAL DATA:  Initial evaluation for acute stroke. EXAM: MRA NECK WITHOUT AND WITH CONTRAST MRA HEAD WITHOUT CONTRAST TECHNIQUE: Multiplanar and multiecho pulse  sequences of the neck were obtained without and with intravenous contrast. Angiographic images of the neck were obtained using MRA technique without and with intravenous contrast; Angiographic images of the Circle of Willis were obtained using MRA technique without intravenous contrast. CONTRAST:  9 cc of Gadavist. COMPARISON:  Previous MRI from earlier the same day. FINDINGS: MRA NECK FINDINGS AORTIC ARCH: Partially visualized aortic arch normal in caliber with normal branch pattern. No stenosis seen about the origin of the great vessels. RIGHT CAROTID SYSTEM: Right common and internal carotid arteries patent without stenosis or dissection. No significant atheromatous narrowing about the right carotid bulb. LEFT CAROTID SYSTEM: Left common and internal carotid arteries patent without stenosis or dissection. No significant atheromatous narrowing about the left carotid wall. VERTEBRAL ARTERIES: Both vertebral arteries arise from the subclavian arteries. Right vertebral artery slightly dominant. No visible proximal subclavian artery stenosis. Vertebral arteries patent without stenosis or evidence for dissection. MRA HEAD FINDINGS ANTERIOR CIRCULATION: Both internal carotid arteries widely patent to the termini without stenosis or other abnormality. A1 segments widely patent. Normal anterior communicating artery complex. Anterior cerebral arteries widely patent without stenosis. No M1 stenosis or occlusion. Normal MCA bifurcations. Distal MCA branches well perfused and symmetric. POSTERIOR CIRCULATION: Visualized V4 segments widely patent bilaterally. Right vertebral artery dominant. Partially visualized PICA patent bilaterally. Basilar patent to its distal aspect without stenosis. Superior cerebellar are and posterior cerebral arteries widely patent bilaterally. Small bilateral posterior communicating arteries noted. Anatomic variants: None significant.  No aneurysm. IMPRESSION: Negative MRA of the head and neck. No  large vessel occlusion. No hemodynamically significant or correctable stenosis. Electronically Signed   By: Jeannine Boga M.D.   On: 06/04/2021 00:30   MR Angiogram Neck W or Wo Contrast  Result Date: 06/04/2021 CLINICAL DATA:  Initial evaluation for acute stroke. EXAM: MRA NECK WITHOUT AND WITH CONTRAST MRA HEAD WITHOUT CONTRAST TECHNIQUE: Multiplanar and multiecho pulse sequences of the neck were obtained without and with intravenous contrast. Angiographic images of the neck were obtained using MRA technique without and with intravenous contrast; Angiographic images of the Circle of Willis were obtained using MRA technique without intravenous contrast. CONTRAST:  9 cc of Gadavist. COMPARISON:  Previous MRI from earlier the same day. FINDINGS: MRA NECK FINDINGS AORTIC ARCH: Partially visualized aortic arch normal in caliber with normal branch pattern. No stenosis seen about the origin of the great vessels. RIGHT CAROTID SYSTEM: Right common and internal carotid arteries patent without stenosis or dissection. No significant atheromatous narrowing about the right carotid bulb. LEFT CAROTID SYSTEM: Left common and internal carotid arteries patent without stenosis or dissection. No significant atheromatous narrowing about the left carotid wall. VERTEBRAL ARTERIES: Both vertebral arteries arise from the subclavian arteries. Right vertebral artery slightly dominant. No visible proximal subclavian artery stenosis. Vertebral arteries patent without stenosis or evidence for dissection. MRA HEAD FINDINGS ANTERIOR CIRCULATION: Both internal carotid arteries widely patent to the termini without stenosis or other abnormality. A1 segments widely patent. Normal anterior communicating artery complex. Anterior cerebral arteries widely patent without stenosis. No M1 stenosis or occlusion. Normal MCA bifurcations. Distal MCA branches well perfused  and symmetric. POSTERIOR CIRCULATION: Visualized V4 segments widely patent  bilaterally. Right vertebral artery dominant. Partially visualized PICA patent bilaterally. Basilar patent to its distal aspect without stenosis. Superior cerebellar are and posterior cerebral arteries widely patent bilaterally. Small bilateral posterior communicating arteries noted. Anatomic variants: None significant.  No aneurysm. IMPRESSION: Negative MRA of the head and neck. No large vessel occlusion. No hemodynamically significant or correctable stenosis. Electronically Signed   By: Jeannine Boga M.D.   On: 06/04/2021 00:30   MR Brain Wo Contrast (neuro protocol)  Result Date: 06/03/2021 CLINICAL DATA:  Initial evaluation for neuro deficit, stroke suspected./ EXAM: MRI HEAD WITHOUT CONTRAST TECHNIQUE: Multiplanar, multiecho pulse sequences of the brain and surrounding structures were obtained without intravenous contrast. COMPARISON:  Head CT from earlier the same day. FINDINGS: Brain: Cerebral volume within normal limits for age. Patchy and confluent T2/FLAIR hyperintensity involving the periventricular deep white matter both cerebral hemispheres most consistent with chronic small vessel ischemic disease, moderate to advanced in nature. Multiple scattered remote lacunar infarcts present about the bilateral basal ganglia/hemispheric cerebral white matter. Chronic right MCA territory infarct noted at the high right frontal lobe. Additional chronic left cerebellar infarcts noted. Additional small areas of encephalomalacia and gliosis involving the anterior frontal poles favored to be related to remote trauma. 12 mm acute ischemic infarcts seen involving the right pons (series 5, image 62). Additional scattered acute ischemic infarcts seen involving the cortical and subcortical aspect of the left frontal, parietal, and occipital regions, consistent with acute ischemic infarcts as well (series 5, image 84). These are watershed in distribution. No associated hemorrhage or significant mass effect about  these areas of infarction. Otherwise, gray-white matter differentiation maintained. No areas of acute or chronic intracranial hemorrhage. No mass lesion, midline shift or mass effect. No hydrocephalus or extra-axial fluid collection. Pituitary gland suprasellar region normal. Midline structures intact. Vascular: Major intracranial vascular flow voids are maintained. Skull and upper cervical spine: Craniocervical junction within normal limits. Bone marrow signal intensity normal. No scalp soft tissue abnormality. Sinuses/Orbits: Globes and orbital soft tissues demonstrate no acute finding. Mild scattered mucosal thickening noted within the ethmoidal air cells. Paranasal sinuses are otherwise clear. Trace right mastoid effusion noted, of doubtful significance. Other: None. IMPRESSION: 1. 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 2. Additional scattered acute ischemic nonhemorrhagic infarcts involving the cortical and subcortical aspect of the left frontal, parietal, and occipital lobes. These are watershed in distribution. 3. Underlying moderate to advanced chronic microvascular ischemic disease with multiple remote infarcts involving the right frontal lobe, bilateral basal ganglia/hemispheric cerebral white matter, left cerebellum. 4. Additional small areas of encephalomalacia involving the anterior frontal lobes bilaterally, likely related to remote trauma. Electronically Signed   By: Jeannine Boga M.D.   On: 06/03/2021 20:09   ECHOCARDIOGRAM COMPLETE  Result Date: 06/04/2021    ECHOCARDIOGRAM REPORT   Patient Name:   Ryan Montgomery Date of Exam: 06/04/2021 Medical Rec #:  [id number]       Height:       71.0 in Accession #:    [id number]      Weight:       180.0 lb Date of Birth:  10/12/1955        BSA:          2.016 m Patient Age:    66 years        BP:           133/68 mmHg Patient Gender: M  HR:           58 bpm. Exam Location:  Inpatient Procedure: 2D Echo, Cardiac Doppler  and Color Doppler Indications:    Stroke  History:        Patient has prior history of Echocardiogram examinations.  Sonographer:    Cleatis Polkaaylor Peper Referring Phys: [phone] DANIEL P GOODRICH IMPRESSIONS  1. Left ventricular ejection fraction, by estimation, is 55 to 60%. The left ventricle has normal function. The left ventricle has no regional wall motion abnormalities. There is mild concentric left ventricular hypertrophy. Left ventricular diastolic parameters were normal.  2. Right ventricular systolic function is normal. The right ventricular size is normal. There is normal pulmonary artery systolic pressure.  3. Left atrial size was mildly dilated.  4. The mitral valve is normal in structure. Trivial mitral valve regurgitation. No evidence of mitral stenosis.  5. The aortic valve is tricuspid. Aortic valve regurgitation is mild. No aortic stenosis is present.  6. Aortic dilatation noted. There is moderate dilatation of the aortic root, measuring 44 mm.  7. The inferior vena cava is normal in size with greater than 50% respiratory variability, suggesting right atrial pressure of 3 mmHg. Comparison(s): No significant change from prior study. Prior images reviewed side by side. FINDINGS  Left Ventricle: Left ventricular ejection fraction, by estimation, is 55 to 60%. The left ventricle has normal function. The left ventricle has no regional wall motion abnormalities. The left ventricular internal cavity size was normal in size. There is  mild concentric left ventricular hypertrophy. Left ventricular diastolic parameters were normal. Normal left ventricular filling pressure. Right Ventricle: The right ventricular size is normal. No increase in right ventricular wall thickness. Right ventricular systolic function is normal. There is normal pulmonary artery systolic pressure. The tricuspid regurgitant velocity is 2.50 m/s, and  with an assumed right atrial pressure of 3 mmHg, the estimated right ventricular systolic  pressure is 28.0 mmHg. Left Atrium: Left atrial size was mildly dilated. Right Atrium: Right atrial size was normal in size. Pericardium: There is no evidence of pericardial effusion. Mitral Valve: The mitral valve is normal in structure. Trivial mitral valve regurgitation. No evidence of mitral valve stenosis. Tricuspid Valve: The tricuspid valve is normal in structure. Tricuspid valve regurgitation is trivial. No evidence of tricuspid stenosis. Aortic Valve: The aortic valve is tricuspid. Aortic valve regurgitation is mild. Aortic regurgitation PHT measures 605 msec. No aortic stenosis is present. Aortic valve peak gradient measures 3.6 mmHg. Pulmonic Valve: The pulmonic valve was grossly normal. Pulmonic valve regurgitation is mild. No evidence of pulmonic stenosis. Aorta: Aortic dilatation noted. There is moderate dilatation of the aortic root, measuring 44 mm. Venous: The inferior vena cava is normal in size with greater than 50% respiratory variability, suggesting right atrial pressure of 3 mmHg. IAS/Shunts: No atrial level shunt detected by color flow Doppler.  LEFT VENTRICLE PLAX 2D LVIDd:         4.50 cm      Diastology LVIDs:         3.20 cm      LV e' medial:    6.74 cm/s LV PW:         1.20 cm      LV E/e' medial:  11.4 LV IVS:        1.20 cm      LV e' lateral:   9.03 cm/s LVOT diam:     2.30 cm      LV E/e' lateral: 8.5 LV SV:  84 LV SV Index:   42 LVOT Area:     4.15 cm  LV Volumes (MOD) LV vol d, MOD A2C: 123.0 ml LV vol d, MOD A4C: 151.0 ml LV vol s, MOD A2C: 53.0 ml LV vol s, MOD A4C: 63.7 ml LV SV MOD A2C:     70.0 ml LV SV MOD A4C:     151.0 ml LV SV MOD BP:      77.8 ml RIGHT VENTRICLE             IVC RV Basal diam:  3.80 cm     IVC diam: 2.00 cm RV Mid diam:    3.50 cm RV S prime:     10.30 cm/s TAPSE (M-mode): 2.1 cm LEFT ATRIUM             Index        RIGHT ATRIUM           Index LA diam:        3.80 cm 1.88 cm/m   RA Area:     12.30 cm LA Vol (A2C):   67.2 ml 33.33 ml/m  RA  Volume:   20.60 ml  10.22 ml/m LA Vol (A4C):   64.2 ml 31.84 ml/m LA Biplane Vol: 66.1 ml 32.78 ml/m  AORTIC VALVE AV Area (Vmax): 3.81 cm AV Vmax:        94.70 cm/s AV Peak Grad:   3.6 mmHg LVOT Vmax:      86.80 cm/s LVOT Vmean:     62.100 cm/s LVOT VTI:       0.202 m AI PHT:         605 msec  AORTA Ao Root diam: 4.40 cm Ao Asc diam:  3.50 cm MITRAL VALVE               TRICUSPID VALVE MV Area (PHT): 5.02 cm    TR Peak grad:   25.0 mmHg MV Decel Time: 151 msec    TR Vmax:        250.00 cm/s MV E velocity: 77.10 cm/s MV A velocity: 56.60 cm/s  SHUNTS MV E/A ratio:  1.36        Systemic VTI:  0.20 m                            Systemic Diam: 2.30 cm Rachelle Hora Croitoru MD Electronically signed by Thurmon Fair MD Signature Date/Time: 06/04/2021/12:56:21 PM    Final    VAS [place] LOWER EXTREMITY VENOUS (DVT)  Result Date: 06/06/2021  Lower Venous DVT Study Patient Name:  Ryan Montgomery  Date of Exam:   06/06/2021 Medical Rec #: [id number]        Accession #:    [id number] Date of Birth: [birth year]         Patient Gender: M Patient Age:   42 years Exam Location:  [facility] Procedure:      VAS [place] LOWER EXTREMITY VENOUS (DVT) Referring Phys: Scheryl Marten XU --------------------------------------------------------------------------------  Indications: Stroke.  Comparison Study: No prior study Performing Technologist: Sherren Kerns RVS  Examination Guidelines: A complete evaluation includes B-mode imaging, spectral Doppler, color Doppler, and power Doppler as needed of all accessible portions of each vessel. Bilateral testing is considered an integral part of a complete examination. Limited examinations for reoccurring indications may be performed as noted. The reflux portion of the exam is performed with the patient in reverse Trendelenburg.  +---------+---------------+---------+-----------+----------+--------------+  RIGHT  Compressibility Phasicity Spontaneity Properties Thrombus Aging   +---------+---------------+---------+-----------+----------+--------------+  CFV       Full            Yes       Yes                                    +---------+---------------+---------+-----------+----------+--------------+  SFJ       Full                                                             +---------+---------------+---------+-----------+----------+--------------+  FV Prox   Full                                                             +---------+---------------+---------+-----------+----------+--------------+  FV Mid    Full                                                             +---------+---------------+---------+-----------+----------+--------------+  FV Distal Full                                                             +---------+---------------+---------+-----------+----------+--------------+  PFV       Full                                                             +---------+---------------+---------+-----------+----------+--------------+  POP       Full            Yes       Yes                                    +---------+---------------+---------+-----------+----------+--------------+  PTV       Full                                                             +---------+---------------+---------+-----------+----------+--------------+  PERO      Full                                                             +---------+---------------+---------+-----------+----------+--------------+   +---------+---------------+---------+-----------+----------+--------------+  LEFT      Compressibility Phasicity Spontaneity Properties Thrombus Aging  +---------+---------------+---------+-----------+----------+--------------+  CFV       Full            Yes       Yes                                    +---------+---------------+---------+-----------+----------+--------------+  SFJ       Full                                                              +---------+---------------+---------+-----------+----------+--------------+  FV Prox   Full                                                             +---------+---------------+---------+-----------+----------+--------------+  FV Mid    Full                                                             +---------+---------------+---------+-----------+----------+--------------+  FV Distal Full                                                             +---------+---------------+---------+-----------+----------+--------------+  PFV       Full                                                             +---------+---------------+---------+-----------+----------+--------------+  POP       Full            Yes       Yes                                    +---------+---------------+---------+-----------+----------+--------------+  PTV       Full                                                             +---------+---------------+---------+-----------+----------+--------------+  PERO      Full                                                             +---------+---------------+---------+-----------+----------+--------------+  Summary: BILATERAL: - No evidence of deep vein thrombosis seen in the lower extremities, bilaterally. -No evidence of popliteal cyst, bilaterally.   *See table(s) above for measurements and observations. Electronically signed by Orlie Pollen on 06/06/2021 at 3:56:46 PM.    Final      PHYSICAL EXAM  Temp:  [97.5 F (36.4 C)-98.4 F (36.9 C)] 98.4 F (36.9 C) (01/14 0838) Pulse Rate:  [75-90] 75 (01/14 0838) Resp:  [18-20] 18 (01/14 0838) BP: (126-136)/(72-84) 133/79 (01/14 0838) SpO2:  [96 %-99 %] 97 % (01/14 0838)  General - Well nourished, well developed, in no apparent distress.  Ophthalmologic - fundi not visualized due to noncooperation.  Cardiovascular - Regular rhythm and rate.  Mental Status -  Level of arousal and orientation to time, place, and person were  intact. Language including expression, naming, repetition, comprehension was assessed and found intact. Fund of Knowledge was assessed and was intact.  Cranial Nerves II - XII - II - Visual field intact OU. III, IV, VI - Extraocular movements intact. V - Facial sensation intact bilaterally. VII - Facial movement intact bilaterally. VIII - Hearing & vestibular intact bilaterally. X - Palate elevates symmetrically. XI - Chin turning & shoulder shrug intact bilaterally. XII - Tongue protrusion intact.  Motor Strength - The patients strength was normal in all extremities and pronator drift was absent except mild right hand grip and dexterity difficulty, however patient stated that was chronic.  Bulk was normal and fasciculations were absent.   Motor Tone - Muscle tone was assessed at the neck and appendages and was normal.  Reflexes - The patients reflexes were symmetrical in all extremities and he had no pathological reflexes.  Sensory - Light touch, temperature/pinprick were assessed and were symmetrical.    Coordination - The patient had normal movements in the hands and feet with no ataxia or dysmetria.  Tremor was absent.  Gait and Station - deferred.   ASSESSMENT/PLAN Mr. Ryan Montgomery is a 66 y.o. male with history of smoker, hepatitis C, stroke 3 to 4 years ago without residual admitted for slurred speech, fall at home and confusion. No tPA given due to outside window.    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 MRI right pontine and scattered acute ischemic nonhemorrhagic infarcts involving the cortical and subcortical aspect of the left frontal, parietal, and occipital lobes. These are watershed in distribution MRA head and neck unremarkable LE venous Doppler no DVT 2D Echo EF 55 to 60% LDL 73 HgbA1c 4.7 Hypercoagulable work-up Homocystein 18.0, Lupus  anticoagulant negative, ANA wnl, HIV negative, TSH wnl, B12 wnl, RPR negative, remainder of workup pending Recommended TEE and loop recorder.  Plan to have both completed on Monday. Lovenox for VTE prophylaxis No antithrombotic prior to admission, now on aspirin 81 mg daily and clopidogrel 75 mg daily DAPT for 3 weeks and then aspirin alone. Patient counseled to be compliant with his antithrombotic medications Ongoing aggressive stroke risk factor management Therapy recommendations: SNF Disposition: Pending  History of LOC 11/2019 LOC with possible jerking movement EEG negative Concern for seizure, put on Keppra 500 mg twice daily However Keppra was not on his current medication list No more similar episode since  History of heart palpitation Patient stated that 5 years ago, while he was at work, he had heart fluttering, heart palpitation for 30 minutes.  He did not seek for medical attention at that time  Given currently embolic Stroke, recommend loop recorder to rule out afib  Hypertension stable Long term BP goal normotensive  Lipid management Home meds: None LDL 63, goal < 70 Now on Lipitor 20 No high intensity statin due to LDL at goal Continue statin at discharge  Tobacco abuse Current smoker Smoking cessation counseling provided Nicotine patch provided Pt is willing to quit  Other Stroke Risk Factors Hx stroke/TIA - 3-4 years ago, details not clear, no residual left  Other Active Problems AKI, creatinine 2.13-1.47-1.11 Mild leukocytosis, WBC 10.3  Hospital day # 5 Patient remains neurologically stable.  Plan to do transcranial Doppler bubble study today to look for PFO.  He awaits TEE next week.  Start folic acid 1 mg daily for mildly elevated homocystine. Discussed with Dr. Tawanna Solo.  Greater than 50% time during this 30-minute visit was spent in counseling and coordination of care about his cryptogenic stroke discussion about stroke prevention treatment and  answering questions.   Antony Contras, MD  Stroke Neurology 06/08/2021 11:55 AM     To contact Stroke Continuity provider, please refer to [email or web address] After hours, contact General Neurology

## 2021-06-09 LAB — PHOSPHATIDYLSERINE ANTIBODIES
Phosphatydalserine, IgA: 1 APS Units (ref 0–19)
Phosphatydalserine, IgG: 9 Units (ref 0–30)
Phosphatydalserine, IgM: <10 Units (ref 0–30)

## 2021-06-09 MED ORDER — FOLIC ACID 1 MG PO TABS
1.0000 mg | ORAL_TABLET | Freq: Every day | ORAL | Status: DC
  Administered 2021-06-09 – 2021-06-14 (×6): 1 mg via ORAL
  Filled 2021-06-09 (×6): qty 1

## 2021-06-09 NOTE — Anesthesia Preprocedure Evaluation (Addendum)
Anesthesia Evaluation  Patient identified by MRN, date of birth, ID band Patient awake    Reviewed: Allergy & Precautions, NPO status , Patient's Chart, lab work & pertinent test results  Airway Mallampati: III  TM Distance: >3 FB Neck ROM: Full    Dental  (+) Missing, Dental Advisory Given   Pulmonary Current Smoker,  4 cigg/d No inhalers   Pulmonary exam normal breath sounds clear to auscultation       Cardiovascular Normal cardiovascular exam Rhythm:Regular Rate:Normal  Echo 06/04/21: 1. Left ventricular ejection fraction, by estimation, is 55 to 60%. The  left ventricle has normal function. The left ventricle has no regional  wall motion abnormalities. There is mild concentric left ventricular  hypertrophy. Left ventricular diastolic  parameters were normal.  2. Right ventricular systolic function is normal. The right ventricular  size is normal. There is normal pulmonary artery systolic pressure.  3. Left atrial size was mildly dilated.  4. The mitral valve is normal in structure. Trivial mitral valve  regurgitation. No evidence of mitral stenosis.  5. The aortic valve is tricuspid. Aortic valve regurgitation is mild. No  aortic stenosis is present.  6. Aortic dilatation noted. There is moderate dilatation of the aortic  root, measuring 44 mm.  7. The inferior vena cava is normal in size with greater than 50%  respiratory variability, suggesting right atrial pressure of 3 mmHg.    Neuro/Psych neg Seizures PSYCHIATRIC DISORDERS CVA (speech), Residual Symptoms    GI/Hepatic negative GI ROS, (+) Hepatitis - (treated), C  Endo/Other  negative endocrine ROS  Renal/GU negative Renal ROS  negative genitourinary   Musculoskeletal negative musculoskeletal ROS (+)   Abdominal   Peds  Hematology negative hematology ROS (+) hct 38.3   Anesthesia Other Findings   Reproductive/Obstetrics negative OB ROS                             Anesthesia Physical Anesthesia Plan  ASA: 3  Anesthesia Plan: MAC   Post-op Pain Management:    Induction:   PONV Risk Score and Plan: 2 and Propofol infusion and TIVA  Airway Management Planned: Natural Airway and Simple Face Mask  Additional Equipment: None  Intra-op Plan:   Post-operative Plan:   Informed Consent: I have reviewed the patients History and Physical, chart, labs and discussed the procedure including the risks, benefits and alternatives for the proposed anesthesia with the patient or authorized representative who has indicated his/her understanding and acceptance.     Dental advisory given  Plan Discussed with: CRNA  Anesthesia Plan Comments:        Anesthesia Quick Evaluation

## 2021-06-09 NOTE — H&P (View-Only) (Signed)
PROGRESS NOTE    Ryan Montgomery  [id number] DOB: Nov 05, 1955 DOA: 06/03/2021 PCP: Laurey Morale, MD   Chief Complain: Patient is a 66 year old male with history of tobacco abuse, prior stroke with no residual deficits, hepatitis C who presented to the emergency department with complaints of 1 week history of slurred speech, falls.  MRI on presentation showed acute to subacute nonhemorrhagic right pontine infarct, scattered nonhemorrhagic infarcts involving the cortical and subcortical aspect of the left frontoparietal and occipital lobe concerning for watershed distribution.  MRA of the head and neck did not show any significant stenosis.  Suspicion for cardioembolic etiology.  Neurology following.  Plan for TEE/loop recorder placement.  PT/OT evaluation done,recommended SNF.  Brief Narrative:   Assessment & Plan:   Principal Problem:   Acute CVA (cerebrovascular accident) ([place]) Active Problems:   AKI (acute kidney injury) ([place])   Tobacco use   Aortic dilatation ([place])   Dysphagia   Acute ischemic stroke: Presented with 1 week history of slurred speech, falls, weakness.MRI on presentation showed acute to subacute nonhemorrhagic right pontine infarct, scattered nonhemorrhagic infarcts involving the cortical and subcortical aspect of the left frontoparietal and occipital lobe concerning for watershed distribution.  MRA of the head and neck did not show any significant stenosis.  Suspicion for cardioembolic etiology.  Neurology following.  Plan for TEE/loop recorder placement,will be done on Monday.  PT/OT/speech done, recommended skilled nursing facility on discharge. LDL of 63.  Hemoglobin A1c of 4.7.  Continue aspirin 81 mg and Plavix for now.  Started on Lipitor 20 mg daily. Venous Doppler  ruled out DVT Echo showed EF of 55 to 60%, no regional wall motion, mild dilated left atrial size no intra-atrial shunt.  Transcranial Doppler with bubble study did not show any significant  finding.  AKI: resolved.  IV fluid discontinued  Aortic dilation: Seen on echocardiogram.  Recommend outpatient follow-up with cardiology.  Tobacco use: Continue nicotine patch.  Counseled for cessation  History of hepatitis C :status post treatment  Hypertension: Blood pressure stable now . On amlodipine 5 mg daily  Debility/deconditioning: PT/OT recommended skilled NF  on discharge.        DVT prophylaxis:Lovenox Code Status: Full Family Communication: None at the bedside Patient status:Inpatient  Dispo: The patient is from: Home              Anticipated d/c is to:  SNF               Anticipated d/c date is: Pending TEE, loop recorder  Procedures: None  Antimicrobials:  Anti-infectives (From admission, onward)    None       Subjective:  Patient seen and examined at the bedside this morning.  Hemodynamically stable.  Comfortable without any complaints   Objective: Vitals:   06/08/21 1203 06/08/21 1926 06/08/21 2314 06/09/21 0311  BP: 126/73 129/78 (!) 144/88 140/79  Pulse: 83 90 81 85  Resp: 18 20 20 20   Temp: 98.2 F (36.8 C) 98.3 F (36.8 C) 98.2 F (36.8 C) 98.4 F (36.9 C)  TempSrc: Oral Oral Oral Oral  SpO2: 99% 96% 97% 95%  Weight:      Height:        Intake/Output Summary (Last 24 hours) at 06/09/2021 0727 Last data filed at 06/09/2021 [id number] Gross per 24 hour  Intake --  Output 1650 ml  Net -1650 ml   Filed Weights   06/03/21 1504 06/04/21 2200  Weight: 81.6 kg 81.8 kg    Examination:  General exam: Overall comfortable, not in distress HEENT: PERRL Respiratory system:  no wheezes or crackles  Cardiovascular system: S1 & S2 heard, RRR.  Gastrointestinal system: Abdomen is nondistended, soft and nontender. Central nervous system: Alert and oriented Extremities: No edema, no clubbing ,no cyanosis Skin: No rashes, no ulcers,no icterus       Data Reviewed: I have personally reviewed following labs and imaging studies  CBC: Recent  Labs  Lab 06/03/21 1633  WBC 10.3  NEUTROABS 4.6  HGB 14.2  HCT 43.4  MCV 100.5*  PLT [id number]   Basic Metabolic Panel: Recent Labs  Lab 06/03/21 1633 06/04/21 0418 06/05/21 0603  NA 141 139 144  K 4.0 3.7 3.5  CL 109 108 112*  CO2 26 25 23   GLUCOSE 107* 106* 85  BUN 19 16 10   CREATININE 2.13* 1.47* 1.11  CALCIUM 9.0 8.4* 8.5*   GFR: Estimated Creatinine Clearance: 71.6 mL/min (by C-G formula based on SCr of 1.11 mg/dL). Liver Function Tests: Recent Labs  Lab 06/03/21 1842  AST 28  ALT 22  ALKPHOS 65  BILITOT 0.8  PROT 8.3*  ALBUMIN 4.1   No results for input(s): LIPASE, AMYLASE in the last 168 hours. No results for input(s): AMMONIA in the last 168 hours. Coagulation Profile: No results for input(s): INR, PROTIME in the last 168 hours. Cardiac Enzymes: No results for input(s): CKTOTAL, CKMB, CKMBINDEX, TROPONINI in the last 168 hours. BNP (last 3 results) No results for input(s): PROBNP in the last 8760 hours. HbA1C: No results for input(s): HGBA1C in the last 72 hours.  CBG: No results for input(s): GLUCAP in the last 168 hours. Lipid Profile: No results for input(s): CHOL, HDL, LDLCALC, TRIG, CHOLHDL, LDLDIRECT in the last 72 hours.  Thyroid Function Tests: No results for input(s): TSH, T4TOTAL, FREET4, T3FREE, THYROIDAB in the last 72 hours.  Anemia Panel: No results for input(s): VITAMINB12, FOLATE, FERRITIN, TIBC, IRON, RETICCTPCT in the last 72 hours.  Sepsis Labs: No results for input(s): PROCALCITON, LATICACIDVEN in the last 168 hours.  Recent Results (from the past 240 hour(s))  Resp Panel by RT-PCR (Flu A&B, Covid) Nasopharyngeal Swab     Status: None   Collection Time: 06/03/21  6:42 PM   Specimen: Nasopharyngeal Swab; Nasopharyngeal(NP) swabs in vial transport medium  Result Value Ref Range Status   SARS Coronavirus 2 by RT PCR NEGATIVE NEGATIVE Final    Comment: (NOTE) SARS-CoV-2 target nucleic acids are NOT DETECTED.  The SARS-CoV-2  RNA is generally detectable in upper respiratory specimens during the acute phase of infection. The lowest concentration of SARS-CoV-2 viral copies this assay can detect is 138 copies/mL. A negative result does not preclude SARS-Cov-2 infection and should not be used as the sole basis for treatment or other patient management decisions. A negative result may occur with  improper specimen collection/handling, submission of specimen other than nasopharyngeal swab, presence of viral mutation(s) within the areas targeted by this assay, and inadequate number of viral copies(<138 copies/mL). A negative result must be combined with clinical observations, patient history, and epidemiological information. The expected result is Negative.  Fact Sheet for Patients:  EntrepreneurPulse.com.au  Fact Sheet for Healthcare Providers:  IncredibleEmployment.be  This test is no t yet approved or cleared by the [place] FDA and  has been authorized for detection and/or diagnosis of SARS-CoV-2 by FDA under an Emergency Use Authorization (EUA). This EUA will remain  in effect (meaning this test can be used) for the duration of the COVID-19  declaration under Section 564(b)(1) of the Act, 21 U.S.C.section 360bbb-3(b)(1), unless the authorization is terminated  or revoked sooner.       Influenza A by PCR NEGATIVE NEGATIVE Final   Influenza B by PCR NEGATIVE NEGATIVE Final    Comment: (NOTE) The Xpert Xpress SARS-CoV-2/FLU/RSV plus assay is intended as an aid in the diagnosis of influenza from Nasopharyngeal swab specimens and should not be used as a sole basis for treatment. Nasal washings and aspirates are unacceptable for Xpert Xpress SARS-CoV-2/FLU/RSV testing.  Fact Sheet for Patients: EntrepreneurPulse.com.au  Fact Sheet for Healthcare Providers: IncredibleEmployment.be  This test is not yet approved or cleared by the  [place] FDA and has been authorized for detection and/or diagnosis of SARS-CoV-2 by FDA under an Emergency Use Authorization (EUA). This EUA will remain in effect (meaning this test can be used) for the duration of the COVID-19 declaration under Section 564(b)(1) of the Act, 21 U.S.C. section 360bbb-3(b)(1), unless the authorization is terminated or revoked.  Performed at [facility], [place] [address] [place], [place] [address]          Radiology Studies: VAS [place] TRANSCRANIAL DOPPLER W BUBBLES  Result Date: 06/08/2021  Transcranial Doppler with Bubble Patient Name:  Ryan Montgomery  Date of Exam:   06/08/2021 Medical Rec #: [id number]        Accession #:    [id number] Date of Birth: June 08, 1955         Patient Gender: M Patient Age:   66 years Exam Location:  [facility] Procedure:      VAS [place] TRANSCRANIAL DOPPLER W BUBBLES Referring Phys: Janine Ores --------------------------------------------------------------------------------  Indications: MRI brain showing 12 mm acute ischemic nonhemorrhagic right pontine infarct. Additional scattered acute ischemic nonhemorrhagic infarcts involving the cortical and subcortical aspect of the left frontal, parietal, and occipital lobes. These are watershed in distribution. History: Slurred speech, falls, and confusion x1 week. Limitations for diagnostic windows: Unable to insonate right transtemporal window. Unable to insonate left transtemporal window. Comparison Study: No prior study Performing Technologist: Sharion Dove RVS  Examination Guidelines: A complete evaluation includes B-mode imaging, spectral Doppler, color Doppler, and power Doppler as needed of all accessible portions of each vessel. Bilateral testing is considered an integral part of a complete examination. Limited examinations for reoccurring indications may be performed as noted.  Summary:  A vascular evaluation was performed. The right Opthalmic was studied.  An IV was inserted into the patient's right forearm. Verbal informed consent was obtained.  Minimal hits noted with Valsalva. No significant findings. *See table(s) above for TCD measurements and observations.    Preliminary         Scheduled Meds:  amLODipine  5 mg Oral Daily   aspirin EC  81 mg Oral Daily   atorvastatin  20 mg Oral Daily   clopidogrel  75 mg Oral Daily   enoxaparin (LOVENOX) injection  40 mg Subcutaneous Q24H   nicotine  14 mg Transdermal Daily   tamsulosin  0.4 mg Oral Daily   Continuous Infusions:  [START ON 06/10/2021] sodium chloride        LOS: 6 days    Time spent: 35 mins.More than 50% of that time was spent in counseling and/or coordination of care.      Shelly Coss, MD [facility] P1/15/2023, 7:27 AM

## 2021-06-09 NOTE — Progress Notes (Signed)
STROKE TEAM PROGRESS NOTE   SUBJECTIVE (INTERVAL HISTORY) No family is at the bedside.  .  Sitting up comfortably in bed and eating lunch.  No complaints.   .  TEE scheduled for Monday.   Vital signs stable and neurological exam unchanged   OBJECTIVE Temp:  [98.2 F (36.8 C)-98.7 F (37.1 C)] 98.7 F (37.1 C) (01/15 0830) Pulse Rate:  [81-102] 102 (01/15 0830) Cardiac Rhythm: Normal sinus rhythm (01/15 0900) Resp:  [17-20] 17 (01/15 0830) BP: (118-144)/(74-88) 118/74 (01/15 0830) SpO2:  [95 %-97 %] 95 % (01/15 0830)  No results for input(s): GLUCAP in the last 168 hours. Recent Labs  Lab 06/03/21 1633 06/04/21 0418 06/05/21 0603  NA 141 139 144  K 4.0 3.7 3.5  CL 109 108 112*  CO2 26 25 23   GLUCOSE 107* 106* 85  BUN 19 16 10   CREATININE 2.13* 1.47* 1.11  CALCIUM 9.0 8.4* 8.5*   Recent Labs  Lab 06/03/21 1842  AST 28  ALT 22  ALKPHOS 65  BILITOT 0.8  PROT 8.3*  ALBUMIN 4.1   Recent Labs  Lab 06/03/21 1633  WBC 10.3  NEUTROABS 4.6  HGB 14.2  HCT 43.4  MCV 100.5*  PLT 183   No results for input(s): CKTOTAL, CKMB, CKMBINDEX, TROPONINI in the last 168 hours. No results for input(s): LABPROT, INR in the last 72 hours. No results for input(s): COLORURINE, LABSPEC, [place], GLUCOSEU, HGBUR, BILIRUBINUR, KETONESUR, PROTEINUR, UROBILINOGEN, NITRITE, LEUKOCYTESUR in the last 72 hours.  Invalid input(s): APPERANCEUR      Component Value Date/Time   CHOL 117 06/05/2021 0328   TRIG 104 06/05/2021 0328   HDL 33 (L) 06/05/2021 0328   CHOLHDL 3.5 06/05/2021 0328   VLDL 21 06/05/2021 0328   LDLCALC 63 06/05/2021 0328   Lab Results  Component Value Date   HGBA1C 4.7 (L) 06/05/2021      Component Value Date/Time   LABOPIA NONE DETECTED 06/04/2021 0030   COCAINSCRNUR NONE DETECTED 06/04/2021 0030   LABBENZ NONE DETECTED 06/04/2021 0030   AMPHETMU NONE DETECTED 06/04/2021 0030   THCU NONE DETECTED 06/04/2021 0030   LABBARB NONE DETECTED 06/04/2021 0030     Recent Labs  Lab 06/03/21 1842  ETH <10    I have personally reviewed the radiological images below and agree with the radiology interpretations.  DG Chest 2 View  Result Date: 06/03/2021 CLINICAL DATA:  Intermittent slurred speech with frequent falls and confusion EXAM: CHEST - 2 VIEW COMPARISON:  Chest x-ray 12/28/2020, CT 09/09/2020 FINDINGS: The heart size and mediastinal contours are within normal limits. Both lungs are clear. Severe chronic compression fracture at T12. IMPRESSION: No active cardiopulmonary disease. Electronically Signed   By: Donavan Foil M.D.   On: 06/03/2021 15:49   CT Head Wo Contrast  Result Date: 06/03/2021 CLINICAL DATA:  Mental status change. EXAM: CT HEAD WITHOUT CONTRAST TECHNIQUE: Contiguous axial images were obtained from the base of the skull through the vertex without intravenous contrast. COMPARISON:  11/27/2018 FINDINGS: Brain: No acute intracranial hemorrhage. No focal mass lesion. No CT evidence of acute infarction. No midline shift or mass effect. No hydrocephalus. Basilar cisterns are patent. Remote small RIGHT frontal cortical and subcortical infarction. Extensive periventricular and subcortical white matter hypodensities. Generalized cortical atrophy. Vascular: No hyperdense vessel or unexpected calcification. Skull: Normal. Negative for fracture or focal lesion. Sinuses/Orbits: Paranasal sinuses and mastoid air cells are clear. Orbits are clear. Other: None. IMPRESSION: 1. No acute intracranial findings. 2. Atrophy and white matter  microvascular disease similar to prior. 3. Remote RIGHT frontal infarction. Electronically Signed   By: Suzy Bouchard M.D.   On: 06/03/2021 16:34   MR ANGIO HEAD WO CONTRAST  Result Date: 06/04/2021 CLINICAL DATA:  Initial evaluation for acute stroke. EXAM: MRA NECK WITHOUT AND WITH CONTRAST MRA HEAD WITHOUT CONTRAST TECHNIQUE: Multiplanar and multiecho pulse sequences of the neck were obtained without and with intravenous  contrast. Angiographic images of the neck were obtained using MRA technique without and with intravenous contrast; Angiographic images of the Circle of Willis were obtained using MRA technique without intravenous contrast. CONTRAST:  9 cc of Gadavist. COMPARISON:  Previous MRI from earlier the same day. FINDINGS: MRA NECK FINDINGS AORTIC ARCH: Partially visualized aortic arch normal in caliber with normal branch pattern. No stenosis seen about the origin of the great vessels. RIGHT CAROTID SYSTEM: Right common and internal carotid arteries patent without stenosis or dissection. No significant atheromatous narrowing about the right carotid bulb. LEFT CAROTID SYSTEM: Left common and internal carotid arteries patent without stenosis or dissection. No significant atheromatous narrowing about the left carotid wall. VERTEBRAL ARTERIES: Both vertebral arteries arise from the subclavian arteries. Right vertebral artery slightly dominant. No visible proximal subclavian artery stenosis. Vertebral arteries patent without stenosis or evidence for dissection. MRA HEAD FINDINGS ANTERIOR CIRCULATION: Both internal carotid arteries widely patent to the termini without stenosis or other abnormality. A1 segments widely patent. Normal anterior communicating artery complex. Anterior cerebral arteries widely patent without stenosis. No M1 stenosis or occlusion. Normal MCA bifurcations. Distal MCA branches well perfused and symmetric. POSTERIOR CIRCULATION: Visualized V4 segments widely patent bilaterally. Right vertebral artery dominant. Partially visualized PICA patent bilaterally. Basilar patent to its distal aspect without stenosis. Superior cerebellar are and posterior cerebral arteries widely patent bilaterally. Small bilateral posterior communicating arteries noted. Anatomic variants: None significant.  No aneurysm. IMPRESSION: Negative MRA of the head and neck. No large vessel occlusion. No hemodynamically significant or  correctable stenosis. Electronically Signed   By: Jeannine Boga M.D.   On: 06/04/2021 00:30   MR Angiogram Neck W or Wo Contrast  Result Date: 06/04/2021 CLINICAL DATA:  Initial evaluation for acute stroke. EXAM: MRA NECK WITHOUT AND WITH CONTRAST MRA HEAD WITHOUT CONTRAST TECHNIQUE: Multiplanar and multiecho pulse sequences of the neck were obtained without and with intravenous contrast. Angiographic images of the neck were obtained using MRA technique without and with intravenous contrast; Angiographic images of the Circle of Willis were obtained using MRA technique without intravenous contrast. CONTRAST:  9 cc of Gadavist. COMPARISON:  Previous MRI from earlier the same day. FINDINGS: MRA NECK FINDINGS AORTIC ARCH: Partially visualized aortic arch normal in caliber with normal branch pattern. No stenosis seen about the origin of the great vessels. RIGHT CAROTID SYSTEM: Right common and internal carotid arteries patent without stenosis or dissection. No significant atheromatous narrowing about the right carotid bulb. LEFT CAROTID SYSTEM: Left common and internal carotid arteries patent without stenosis or dissection. No significant atheromatous narrowing about the left carotid wall. VERTEBRAL ARTERIES: Both vertebral arteries arise from the subclavian arteries. Right vertebral artery slightly dominant. No visible proximal subclavian artery stenosis. Vertebral arteries patent without stenosis or evidence for dissection. MRA HEAD FINDINGS ANTERIOR CIRCULATION: Both internal carotid arteries widely patent to the termini without stenosis or other abnormality. A1 segments widely patent. Normal anterior communicating artery complex. Anterior cerebral arteries widely patent without stenosis. No M1 stenosis or occlusion. Normal MCA bifurcations. Distal MCA branches well perfused and symmetric. POSTERIOR CIRCULATION: Visualized V4  segments widely patent bilaterally. Right vertebral artery dominant. Partially  visualized PICA patent bilaterally. Basilar patent to its distal aspect without stenosis. Superior cerebellar are and posterior cerebral arteries widely patent bilaterally. Small bilateral posterior communicating arteries noted. Anatomic variants: None significant.  No aneurysm. IMPRESSION: Negative MRA of the head and neck. No large vessel occlusion. No hemodynamically significant or correctable stenosis. Electronically Signed   By: Jeannine Boga M.D.   On: 06/04/2021 00:30   MR Brain Wo Contrast (neuro protocol)  Result Date: 06/03/2021 CLINICAL DATA:  Initial evaluation for neuro deficit, stroke suspected./ EXAM: MRI HEAD WITHOUT CONTRAST TECHNIQUE: Multiplanar, multiecho pulse sequences of the brain and surrounding structures were obtained without intravenous contrast. COMPARISON:  Head CT from earlier the same day. FINDINGS: Brain: Cerebral volume within normal limits for age. Patchy and confluent T2/FLAIR hyperintensity involving the periventricular deep white matter both cerebral hemispheres most consistent with chronic small vessel ischemic disease, moderate to advanced in nature. Multiple scattered remote lacunar infarcts present about the bilateral basal ganglia/hemispheric cerebral white matter. Chronic right MCA territory infarct noted at the high right frontal lobe. Additional chronic left cerebellar infarcts noted. Additional small areas of encephalomalacia and gliosis involving the anterior frontal poles favored to be related to remote trauma. 12 mm acute ischemic infarcts seen involving the right pons (series 5, image 62). Additional scattered acute ischemic infarcts seen involving the cortical and subcortical aspect of the left frontal, parietal, and occipital regions, consistent with acute ischemic infarcts as well (series 5, image 84). These are watershed in distribution. No associated hemorrhage or significant mass effect about these areas of infarction. Otherwise, gray-white matter  differentiation maintained. No areas of acute or chronic intracranial hemorrhage. No mass lesion, midline shift or mass effect. No hydrocephalus or extra-axial fluid collection. Pituitary gland suprasellar region normal. Midline structures intact. Vascular: Major intracranial vascular flow voids are maintained. Skull and upper cervical spine: Craniocervical junction within normal limits. Bone marrow signal intensity normal. No scalp soft tissue abnormality. Sinuses/Orbits: Globes and orbital soft tissues demonstrate no acute finding. Mild scattered mucosal thickening noted within the ethmoidal air cells. Paranasal sinuses are otherwise clear. Trace right mastoid effusion noted, of doubtful significance. Other: None. IMPRESSION: 1. 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 2. Additional scattered acute ischemic nonhemorrhagic infarcts involving the cortical and subcortical aspect of the left frontal, parietal, and occipital lobes. These are watershed in distribution. 3. Underlying moderate to advanced chronic microvascular ischemic disease with multiple remote infarcts involving the right frontal lobe, bilateral basal ganglia/hemispheric cerebral white matter, left cerebellum. 4. Additional small areas of encephalomalacia involving the anterior frontal lobes bilaterally, likely related to remote trauma. Electronically Signed   By: Jeannine Boga M.D.   On: 06/03/2021 20:09   VAS [place] TRANSCRANIAL DOPPLER W BUBBLES  Result Date: 06/08/2021  Transcranial Doppler with Bubble Patient Name:  Ryan Montgomery  Date of Exam:   06/08/2021 Medical Rec #: [id number]        Accession #:    [id number] Date of Birth: 08-05-1955         Patient Gender: M Patient Age:   65 years Exam Location:  [facility] Procedure:      VAS [place] TRANSCRANIAL DOPPLER W BUBBLES Referring Phys: Janine Ores --------------------------------------------------------------------------------  Indications: MRI brain showing 12 mm  acute ischemic nonhemorrhagic right pontine infarct. Additional scattered acute ischemic nonhemorrhagic infarcts involving the cortical and subcortical aspect of the left frontal, parietal, and occipital lobes. These are watershed in distribution. History:  Slurred speech, falls, and confusion x1 week. Limitations for diagnostic windows: Unable to insonate right transtemporal window. Unable to insonate left transtemporal window. Comparison Study: No prior study Performing Technologist: Sharion Dove RVS  Examination Guidelines: A complete evaluation includes B-mode imaging, spectral Doppler, color Doppler, and power Doppler as needed of all accessible portions of each vessel. Bilateral testing is considered an integral part of a complete examination. Limited examinations for reoccurring indications may be performed as noted.  Summary:  A vascular evaluation was performed. The right Opthalmic was studied. An IV was inserted into the patient's right forearm. Verbal informed consent was obtained.  Minimal hits noted with Valsalva. No significant findings. *See table(s) above for TCD measurements and observations.    Preliminary    ECHOCARDIOGRAM COMPLETE  Result Date: 06/04/2021    ECHOCARDIOGRAM REPORT   Patient Name:   Ryan Montgomery Date of Exam: 06/04/2021 Medical Rec #:  [id number]       Height:       71.0 in Accession #:    [id number]      Weight:       180.0 lb Date of Birth:  1955/08/12        BSA:          2.016 m Patient Age:    2 years        BP:           133/68 mmHg Patient Gender: M               HR:           58 bpm. Exam Location:  Inpatient Procedure: 2D Echo, Cardiac Doppler and Color Doppler Indications:    Stroke  History:        Patient has prior history of Echocardiogram examinations.  Sonographer:    Jyl Heinz Referring Phys: Kirkpatrick [place]  1. Left ventricular ejection fraction, by estimation, is 55 to 60%. The left ventricle has normal function. The left ventricle has  no regional wall motion abnormalities. There is mild concentric left ventricular hypertrophy. Left ventricular diastolic parameters were normal.  2. Right ventricular systolic function is normal. The right ventricular size is normal. There is normal pulmonary artery systolic pressure.  3. Left atrial size was mildly dilated.  4. The mitral valve is normal in structure. Trivial mitral valve regurgitation. No evidence of mitral stenosis.  5. The aortic valve is tricuspid. Aortic valve regurgitation is mild. No aortic stenosis is present.  6. Aortic dilatation noted. There is moderate dilatation of the aortic root, measuring 44 mm.  7. The inferior vena cava is normal in size with greater than 50% respiratory variability, suggesting right atrial pressure of 3 mmHg. Comparison(s): No significant change from prior study. Prior images reviewed side by side. FINDINGS  Left Ventricle: Left ventricular ejection fraction, by estimation, is 55 to 60%. The left ventricle has normal function. The left ventricle has no regional wall motion abnormalities. The left ventricular internal cavity size was normal in size. There is  mild concentric left ventricular hypertrophy. Left ventricular diastolic parameters were normal. Normal left ventricular filling pressure. Right Ventricle: The right ventricular size is normal. No increase in right ventricular wall thickness. Right ventricular systolic function is normal. There is normal pulmonary artery systolic pressure. The tricuspid regurgitant velocity is 2.50 m/s, and  with an assumed right atrial pressure of 3 mmHg, the estimated right ventricular systolic pressure is [id number] mmHg. Left Atrium: Left atrial size was mildly dilated. Right  Atrium: Right atrial size was normal in size. Pericardium: There is no evidence of pericardial effusion. Mitral Valve: The mitral valve is normal in structure. Trivial mitral valve regurgitation. No evidence of mitral valve stenosis. Tricuspid Valve: The  tricuspid valve is normal in structure. Tricuspid valve regurgitation is trivial. No evidence of tricuspid stenosis. Aortic Valve: The aortic valve is tricuspid. Aortic valve regurgitation is mild. Aortic regurgitation PHT measures 605 msec. No aortic stenosis is present. Aortic valve peak gradient measures 3.6 mmHg. Pulmonic Valve: The pulmonic valve was grossly normal. Pulmonic valve regurgitation is mild. No evidence of pulmonic stenosis. Aorta: Aortic dilatation noted. There is moderate dilatation of the aortic root, measuring 44 mm. Venous: The inferior vena cava is normal in size with greater than 50% respiratory variability, suggesting right atrial pressure of 3 mmHg. IAS/Shunts: No atrial level shunt detected by color flow Doppler.  LEFT VENTRICLE PLAX 2D LVIDd:         4.50 cm      Diastology LVIDs:         3.20 cm      LV e' medial:    6.74 cm/s LV PW:         1.20 cm      LV E/e' medial:  11.4 LV IVS:        1.20 cm      LV e' lateral:   9.03 cm/s LVOT diam:     2.30 cm      LV E/e' lateral: 8.5 LV SV:         84 LV SV Index:   42 LVOT Area:     4.15 cm  LV Volumes (MOD) LV vol d, MOD A2C: 123.0 ml LV vol d, MOD A4C: 151.0 ml LV vol s, MOD A2C: 53.0 ml LV vol s, MOD A4C: 63.7 ml LV SV MOD A2C:     70.0 ml LV SV MOD A4C:     151.0 ml LV SV MOD BP:      77.8 ml RIGHT VENTRICLE             IVC RV Basal diam:  3.80 cm     IVC diam: 2.00 cm RV Mid diam:    3.50 cm RV S prime:     10.30 cm/s TAPSE (M-mode): 2.1 cm LEFT ATRIUM             Index        RIGHT ATRIUM           Index LA diam:        3.80 cm 1.88 cm/m   RA Area:     12.30 cm LA Vol (A2C):   67.2 ml 33.33 ml/m  RA Volume:   20.60 ml  10.22 ml/m LA Vol (A4C):   64.2 ml 31.84 ml/m LA Biplane Vol: 66.1 ml 32.78 ml/m  AORTIC VALVE AV Area (Vmax): 3.81 cm AV Vmax:        94.70 cm/s AV Peak Grad:   3.6 mmHg LVOT Vmax:      86.80 cm/s LVOT Vmean:     62.100 cm/s LVOT VTI:       0.202 m AI PHT:         605 msec  AORTA Ao Root diam: 4.40 cm Ao Asc  diam:  3.50 cm MITRAL VALVE               TRICUSPID VALVE MV Area (PHT): 5.02 cm    TR Peak grad:   25.0  mmHg MV Decel Time: 151 msec    TR Vmax:        250.00 cm/s MV E velocity: 77.10 cm/s MV A velocity: 56.60 cm/s  SHUNTS MV E/A ratio:  1.36        Systemic VTI:  0.20 m                            Systemic Diam: 2.30 cm Dani Gobble Croitoru MD Electronically signed by Sanda Klein MD Signature Date/Time: 06/04/2021/12:56:21 PM    Final    VAS [place] LOWER EXTREMITY VENOUS (DVT)  Result Date: 06/06/2021  Lower Venous DVT Study Patient Name:  Ryan Montgomery  Date of Exam:   06/06/2021 Medical Rec #: [id number]        Accession #:    [id number] Date of Birth: 12/10/1955         Patient Gender: M Patient Age:   31 years Exam Location:  [facility] Procedure:      VAS [place] LOWER EXTREMITY VENOUS (DVT) Referring Phys: Cornelius Moras XU --------------------------------------------------------------------------------  Indications: Stroke.  Comparison Study: No prior study Performing Technologist: Sharion Dove RVS  Examination Guidelines: A complete evaluation includes B-mode imaging, spectral Doppler, color Doppler, and power Doppler as needed of all accessible portions of each vessel. Bilateral testing is considered an integral part of a complete examination. Limited examinations for reoccurring indications may be performed as noted. The reflux portion of the exam is performed with the patient in reverse Trendelenburg.  +---------+---------------+---------+-----------+----------+--------------+  RIGHT     Compressibility Phasicity Spontaneity Properties Thrombus Aging  +---------+---------------+---------+-----------+----------+--------------+  CFV       Full            Yes       Yes                                    +---------+---------------+---------+-----------+----------+--------------+  SFJ       Full                                                              +---------+---------------+---------+-----------+----------+--------------+  FV Prox   Full                                                             +---------+---------------+---------+-----------+----------+--------------+  FV Mid    Full                                                             +---------+---------------+---------+-----------+----------+--------------+  FV Distal Full                                                             +---------+---------------+---------+-----------+----------+--------------+  PFV       Full                                                             +---------+---------------+---------+-----------+----------+--------------+  POP       Full            Yes       Yes                                    +---------+---------------+---------+-----------+----------+--------------+  PTV       Full                                                             +---------+---------------+---------+-----------+----------+--------------+  PERO      Full                                                             +---------+---------------+---------+-----------+----------+--------------+   +---------+---------------+---------+-----------+----------+--------------+  LEFT      Compressibility Phasicity Spontaneity Properties Thrombus Aging  +---------+---------------+---------+-----------+----------+--------------+  CFV       Full            Yes       Yes                                    +---------+---------------+---------+-----------+----------+--------------+  SFJ       Full                                                             +---------+---------------+---------+-----------+----------+--------------+  FV Prox   Full                                                             +---------+---------------+---------+-----------+----------+--------------+  FV Mid    Full                                                              +---------+---------------+---------+-----------+----------+--------------+  FV Distal Full                                                             +---------+---------------+---------+-----------+----------+--------------+  PFV       Full                                                             +---------+---------------+---------+-----------+----------+--------------+  POP       Full            Yes       Yes                                    +---------+---------------+---------+-----------+----------+--------------+  PTV       Full                                                             +---------+---------------+---------+-----------+----------+--------------+  PERO      Full                                                             +---------+---------------+---------+-----------+----------+--------------+     Summary: BILATERAL: - No evidence of deep vein thrombosis seen in the lower extremities, bilaterally. -No evidence of popliteal cyst, bilaterally.   *See table(s) above for measurements and observations. Electronically signed by Orlie Pollen on 06/06/2021 at 3:56:46 PM.    Final      PHYSICAL EXAM  Temp:  [98.2 F (36.8 C)-98.7 F (37.1 C)] 98.7 F (37.1 C) (01/15 0830) Pulse Rate:  [81-102] 102 (01/15 0830) Resp:  [17-20] 17 (01/15 0830) BP: (118-144)/(74-88) 118/74 (01/15 0830) SpO2:  [95 %-97 %] 95 % (01/15 0830)  General - Well nourished, well developed, in no apparent distress.  Ophthalmologic - fundi not visualized due to noncooperation.  Cardiovascular - Regular rhythm and rate.  Mental Status -  Level of arousal and orientation to time, place, and person were intact. Language including expression, naming, repetition, comprehension was assessed and found intact. Fund of Knowledge was assessed and was intact.  Cranial Nerves II - XII - II - Visual field intact OU. III, IV, VI - Extraocular movements intact. V - Facial sensation intact bilaterally. VII -  Facial movement intact bilaterally. VIII - Hearing & vestibular intact bilaterally. X - Palate elevates symmetrically. XI - Chin turning & shoulder shrug intact bilaterally. XII - Tongue protrusion intact.  Motor Strength - The patients strength was normal in all extremities and pronator drift was absent except mild right hand grip and dexterity difficulty, however patient stated that was chronic.  Bulk was normal and fasciculations were absent.   Motor Tone - Muscle tone was assessed at the neck and appendages and was normal.  Reflexes - The patients reflexes were symmetrical in all extremities and he had no pathological reflexes.  Sensory - Light touch, temperature/pinprick were assessed and were symmetrical.    Coordination - The patient had normal movements in the hands and feet with no ataxia or dysmetria.  Tremor was absent.  Gait and Station - deferred.   ASSESSMENT/PLAN Mr. Ryan Montgomery is a 66 y.o. male with history of smoker, hepatitis C, stroke 3 to 4 years ago without residual admitted for slurred speech, fall at home and confusion. No tPA given due to outside window.    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 MRI right pontine and scattered acute ischemic nonhemorrhagic infarcts involving the cortical and subcortical aspect of the left frontal, parietal, and occipital lobes. These are watershed in distribution MRA head and neck unremarkable LE venous Doppler no DVT 2D Echo EF 55 to 60% LDL 73 HgbA1c 4.7 Hypercoagulable work-up Homocystein 18.0, Lupus anticoagulant negative, ANA wnl, HIV negative, TSH wnl, B12 wnl, RPR negative, remainder of workup pending Recommended TEE and loop recorder.  Plan to have both completed on Monday. Lovenox for VTE prophylaxis No antithrombotic prior to admission, now on aspirin 81 mg daily and clopidogrel 75 mg daily  DAPT for 3 weeks and then aspirin alone. Patient counseled to be compliant with his antithrombotic medications Ongoing aggressive stroke risk factor management Therapy recommendations: SNF Disposition: Pending  History of LOC 11/2019 LOC with possible jerking movement EEG negative Concern for seizure, put on Keppra 500 mg twice daily However Keppra was not on his current medication list No more similar episode since  History of heart palpitation Patient stated that 5 years ago, while he was at work, he had heart fluttering, heart palpitation for 30 minutes.  He did not seek for medical attention at that time Given currently embolic Stroke, recommend loop recorder to rule out afib  Hypertension stable Long term BP goal normotensive  Lipid management Home meds: None LDL 63, goal < 70 Now on Lipitor 20 No high intensity statin due to LDL at goal Continue statin at discharge  Tobacco abuse Current smoker Smoking cessation counseling provided Nicotine patch provided Pt is willing to quit  Other Stroke Risk Factors Hx stroke/TIA - 3-4 years ago, details not clear, no residual left  Other Active Problems AKI, creatinine 2.13-1.47-1.11 Mild leukocytosis, WBC 10.3  Hospital day # 6 Patient remains neurologically stable.  Plan to do transcranial Doppler bubble study today to look for PFO.  He awaits TEE next week.  Start folic acid 1 mg daily for mildly elevated homocystine.   Antony Contras, MD  Stroke Neurology 06/09/2021 12:39 PM     To contact Stroke Continuity provider, please refer to [email or web address] After hours, contact General Neurology

## 2021-06-09 NOTE — Progress Notes (Signed)
PROGRESS NOTE    Finley Wheatley  [id number] DOB: 1955-09-20 DOA: 06/03/2021 PCP: Laurey Morale, MD   Chief Complain: Patient is a 66 year old male with history of tobacco abuse, prior stroke with no residual deficits, hepatitis C who presented to the emergency department with complaints of 1 week history of slurred speech, falls.  MRI on presentation showed acute to subacute nonhemorrhagic right pontine infarct, scattered nonhemorrhagic infarcts involving the cortical and subcortical aspect of the left frontoparietal and occipital lobe concerning for watershed distribution.  MRA of the head and neck did not show any significant stenosis.  Suspicion for cardioembolic etiology.  Neurology following.  Plan for TEE/loop recorder placement.  PT/OT evaluation done,recommended SNF.  Brief Narrative:   Assessment & Plan:   Principal Problem:   Acute CVA (cerebrovascular accident) ([place]) Active Problems:   AKI (acute kidney injury) ([place])   Tobacco use   Aortic dilatation ([place])   Dysphagia   Acute ischemic stroke: Presented with 1 week history of slurred speech, falls, weakness.MRI on presentation showed acute to subacute nonhemorrhagic right pontine infarct, scattered nonhemorrhagic infarcts involving the cortical and subcortical aspect of the left frontoparietal and occipital lobe concerning for watershed distribution.  MRA of the head and neck did not show any significant stenosis.  Suspicion for cardioembolic etiology.  Neurology following.  Plan for TEE/loop recorder placement,will be done on Monday.  PT/OT/speech done, recommended skilled nursing facility on discharge. LDL of 63.  Hemoglobin A1c of 4.7.  Continue aspirin 81 mg and Plavix for now.  Started on Lipitor 20 mg daily. Venous Doppler  ruled out DVT Echo showed EF of 55 to 60%, no regional wall motion, mild dilated left atrial size no intra-atrial shunt.  Transcranial Doppler with bubble study did not show any significant  finding.  AKI: resolved.  IV fluid discontinued  Aortic dilation: Seen on echocardiogram.  Recommend outpatient follow-up with cardiology.  Tobacco use: Continue nicotine patch.  Counseled for cessation  History of hepatitis C :status post treatment  Hypertension: Blood pressure stable now . On amlodipine 5 mg daily  Debility/deconditioning: PT/OT recommended skilled NF  on discharge.        DVT prophylaxis:Lovenox Code Status: Full Family Communication: None at the bedside Patient status:Inpatient  Dispo: The patient is from: Home              Anticipated d/c is to:  SNF               Anticipated d/c date is: Pending TEE, loop recorder  Procedures: None  Antimicrobials:  Anti-infectives (From admission, onward)    None       Subjective:  Patient seen and examined at the bedside this morning.  Hemodynamically stable.  Comfortable without any complaints   Objective: Vitals:   06/08/21 1203 06/08/21 1926 06/08/21 2314 06/09/21 0311  BP: 126/73 129/78 (!) 144/88 140/79  Pulse: 83 90 81 85  Resp: 18 20 20 20   Temp: 98.2 F (36.8 C) 98.3 F (36.8 C) 98.2 F (36.8 C) 98.4 F (36.9 C)  TempSrc: Oral Oral Oral Oral  SpO2: 99% 96% 97% 95%  Weight:      Height:        Intake/Output Summary (Last 24 hours) at 06/09/2021 0727 Last data filed at 06/09/2021 [id number] Gross per 24 hour  Intake --  Output 1650 ml  Net -1650 ml   Filed Weights   06/03/21 1504 06/04/21 2200  Weight: 81.6 kg 81.8 kg    Examination:  General exam: Overall comfortable, not in distress HEENT: PERRL Respiratory system:  no wheezes or crackles  Cardiovascular system: S1 & S2 heard, RRR.  Gastrointestinal system: Abdomen is nondistended, soft and nontender. Central nervous system: Alert and oriented Extremities: No edema, no clubbing ,no cyanosis Skin: No rashes, no ulcers,no icterus       Data Reviewed: I have personally reviewed following labs and imaging studies  CBC: Recent  Labs  Lab 06/03/21 1633  WBC 10.3  NEUTROABS 4.6  HGB 14.2  HCT 43.4  MCV 100.5*  PLT [id number]   Basic Metabolic Panel: Recent Labs  Lab 06/03/21 1633 06/04/21 0418 06/05/21 0603  NA 141 139 144  K 4.0 3.7 3.5  CL 109 108 112*  CO2 26 25 23   GLUCOSE 107* 106* 85  BUN 19 16 10   CREATININE 2.13* 1.47* 1.11  CALCIUM 9.0 8.4* 8.5*   GFR: Estimated Creatinine Clearance: 71.6 mL/min (by C-G formula based on SCr of 1.11 mg/dL). Liver Function Tests: Recent Labs  Lab 06/03/21 1842  AST 28  ALT 22  ALKPHOS 65  BILITOT 0.8  PROT 8.3*  ALBUMIN 4.1   No results for input(s): LIPASE, AMYLASE in the last 168 hours. No results for input(s): AMMONIA in the last 168 hours. Coagulation Profile: No results for input(s): INR, PROTIME in the last 168 hours. Cardiac Enzymes: No results for input(s): CKTOTAL, CKMB, CKMBINDEX, TROPONINI in the last 168 hours. BNP (last 3 results) No results for input(s): PROBNP in the last 8760 hours. HbA1C: No results for input(s): HGBA1C in the last 72 hours.  CBG: No results for input(s): GLUCAP in the last 168 hours. Lipid Profile: No results for input(s): CHOL, HDL, LDLCALC, TRIG, CHOLHDL, LDLDIRECT in the last 72 hours.  Thyroid Function Tests: No results for input(s): TSH, T4TOTAL, FREET4, T3FREE, THYROIDAB in the last 72 hours.  Anemia Panel: No results for input(s): VITAMINB12, FOLATE, FERRITIN, TIBC, IRON, RETICCTPCT in the last 72 hours.  Sepsis Labs: No results for input(s): PROCALCITON, LATICACIDVEN in the last 168 hours.  Recent Results (from the past 240 hour(s))  Resp Panel by RT-PCR (Flu A&B, Covid) Nasopharyngeal Swab     Status: None   Collection Time: 06/03/21  6:42 PM   Specimen: Nasopharyngeal Swab; Nasopharyngeal(NP) swabs in vial transport medium  Result Value Ref Range Status   SARS Coronavirus 2 by RT PCR NEGATIVE NEGATIVE Final    Comment: (NOTE) SARS-CoV-2 target nucleic acids are NOT DETECTED.  The SARS-CoV-2  RNA is generally detectable in upper respiratory specimens during the acute phase of infection. The lowest concentration of SARS-CoV-2 viral copies this assay can detect is 138 copies/mL. A negative result does not preclude SARS-Cov-2 infection and should not be used as the sole basis for treatment or other patient management decisions. A negative result may occur with  improper specimen collection/handling, submission of specimen other than nasopharyngeal swab, presence of viral mutation(s) within the areas targeted by this assay, and inadequate number of viral copies(<138 copies/mL). A negative result must be combined with clinical observations, patient history, and epidemiological information. The expected result is Negative.  Fact Sheet for Patients:  EntrepreneurPulse.com.au  Fact Sheet for Healthcare Providers:  IncredibleEmployment.be  This test is no t yet approved or cleared by the [place] FDA and  has been authorized for detection and/or diagnosis of SARS-CoV-2 by FDA under an Emergency Use Authorization (EUA). This EUA will remain  in effect (meaning this test can be used) for the duration of the COVID-19  declaration under Section 564(b)(1) of the Act, 21 U.S.C.section 360bbb-3(b)(1), unless the authorization is terminated  or revoked sooner.       Influenza A by PCR NEGATIVE NEGATIVE Final   Influenza B by PCR NEGATIVE NEGATIVE Final    Comment: (NOTE) The Xpert Xpress SARS-CoV-2/FLU/RSV plus assay is intended as an aid in the diagnosis of influenza from Nasopharyngeal swab specimens and should not be used as a sole basis for treatment. Nasal washings and aspirates are unacceptable for Xpert Xpress SARS-CoV-2/FLU/RSV testing.  Fact Sheet for Patients: EntrepreneurPulse.com.au  Fact Sheet for Healthcare Providers: IncredibleEmployment.be  This test is not yet approved or cleared by the  [place] FDA and has been authorized for detection and/or diagnosis of SARS-CoV-2 by FDA under an Emergency Use Authorization (EUA). This EUA will remain in effect (meaning this test can be used) for the duration of the COVID-19 declaration under Section 564(b)(1) of the Act, 21 U.S.C. section 360bbb-3(b)(1), unless the authorization is terminated or revoked.  Performed at [facility], [place] [address], [place], [place] [address]          Radiology Studies: VAS [place] TRANSCRANIAL DOPPLER W BUBBLES  Result Date: 06/08/2021  Transcranial Doppler with Bubble Patient Name:  DEZMIN SEITTER  Date of Exam:   06/08/2021 Medical Rec #: [id number]        Accession #:    [id number] Date of Birth: [birth year]         Patient Gender: M Patient Age:   57 years Exam Location:  [facility] Procedure:      VAS [place] TRANSCRANIAL DOPPLER W BUBBLES Referring Phys: Janine Ores --------------------------------------------------------------------------------  Indications: MRI brain showing 12 mm acute ischemic nonhemorrhagic right pontine infarct. Additional scattered acute ischemic nonhemorrhagic infarcts involving the cortical and subcortical aspect of the left frontal, parietal, and occipital lobes. These are watershed in distribution. History: Slurred speech, falls, and confusion x1 week. Limitations for diagnostic windows: Unable to insonate right transtemporal window. Unable to insonate left transtemporal window. Comparison Study: No prior study Performing Technologist: Sharion Dove RVS  Examination Guidelines: A complete evaluation includes B-mode imaging, spectral Doppler, color Doppler, and power Doppler as needed of all accessible portions of each vessel. Bilateral testing is considered an integral part of a complete examination. Limited examinations for reoccurring indications may be performed as noted.  Summary:  A vascular evaluation was performed. The right Opthalmic was studied.  An IV was inserted into the patient's right forearm. Verbal informed consent was obtained.  Minimal hits noted with Valsalva. No significant findings. *See table(s) above for TCD measurements and observations.    Preliminary         Scheduled Meds:  amLODipine  5 mg Oral Daily   aspirin EC  81 mg Oral Daily   atorvastatin  20 mg Oral Daily   clopidogrel  75 mg Oral Daily   enoxaparin (LOVENOX) injection  40 mg Subcutaneous Q24H   nicotine  14 mg Transdermal Daily   tamsulosin  0.4 mg Oral Daily   Continuous Infusions:  [START ON 06/10/2021] sodium chloride        LOS: 6 days    Time spent: 35 mins.More than 50% of that time was spent in counseling and/or coordination of care.      Shelly Coss, MD [facility] P1/15/2023, 7:27 AM

## 2021-06-09 NOTE — Plan of Care (Signed)
Patient remains alert and oriented x4, able to get out of bed to chair for a few hours. Tolerated well. Condom cath remains. BM noted today. Patient plan to be NPO after MN 01/16 for TEE/loop recorder placement, patient educated and aware.   Problem: Education: Goal: Knowledge of disease or condition will improve Outcome: Progressing Goal: Knowledge of secondary prevention will improve (SELECT ALL) Outcome: Progressing Goal: Knowledge of patient specific risk factors will improve (INDIVIDUALIZE FOR PATIENT) Outcome: Progressing   Problem: Health Behavior/Discharge Planning: Goal: Ability to manage health-related needs will improve Outcome: Progressing   Problem: Education: Goal: Knowledge of General Education information will improve Description: Including pain rating scale, medication(s)/side effects and non-pharmacologic comfort measures Outcome: Progressing   Problem: Health Behavior/Discharge Planning: Goal: Ability to manage health-related needs will improve Outcome: Progressing   Problem: Clinical Measurements: Goal: Ability to maintain clinical measurements within normal limits will improve Outcome: Progressing

## 2021-06-10 ENCOUNTER — Encounter (HOSPITAL_COMMUNITY): Payer: Self-pay | Admitting: Internal Medicine

## 2021-06-10 ENCOUNTER — Inpatient Hospital Stay (HOSPITAL_COMMUNITY): Payer: No Typology Code available for payment source | Admitting: Anesthesiology

## 2021-06-10 ENCOUNTER — Encounter (HOSPITAL_COMMUNITY)
Admission: EM | Disposition: A | Discharge status: Skilled Nursing Facility | Payer: Self-pay | Source: Home / Self Care | Attending: Internal Medicine

## 2021-06-10 ENCOUNTER — Inpatient Hospital Stay (HOSPITAL_COMMUNITY): Payer: No Typology Code available for payment source

## 2021-06-10 DIAGNOSIS — I639 Cerebral infarction, unspecified: Secondary | ICD-10-CM

## 2021-06-10 DIAGNOSIS — Q2112 Patent foramen ovale: Secondary | ICD-10-CM

## 2021-06-10 HISTORY — PX: LOOP RECORDER INSERTION: EP1214

## 2021-06-10 HISTORY — PX: TEE WITHOUT CARDIOVERSION: SHX5443

## 2021-06-10 HISTORY — PX: BUBBLE STUDY: SHX6837

## 2021-06-10 LAB — BASIC METABOLIC PANEL
Anion gap: 8 (ref 5–15)
BUN: 14 mg/dL (ref 8–23)
CO2: 23 mmol/L (ref 22–32)
Calcium: 8.9 mg/dL (ref 8.9–10.3)
Chloride: 105 mmol/L (ref 98–111)
Creatinine, Ser: 1.33 mg/dL — ABNORMAL HIGH (ref 0.61–1.24)
GFR, Estimated: 60 mL/min — ABNORMAL LOW (ref >60)
Glucose, Bld: 118 mg/dL — ABNORMAL HIGH (ref 70–99)
Potassium: 4.1 mmol/L (ref 3.5–5.1)
Sodium: 136 mmol/L (ref 135–145)

## 2021-06-10 LAB — CBC WITH DIFFERENTIAL/PLATELET
Abs Immature Granulocytes: 0 10*3/uL (ref 0.00–0.07)
Basophils Absolute: 0.1 10*3/uL (ref 0.0–0.1)
Basophils Relative: 1 %
Eosinophils Absolute: 0 10*3/uL (ref 0.0–0.5)
Eosinophils Relative: 0 %
HCT: 38.3 % — ABNORMAL LOW (ref 39.0–52.0)
Hemoglobin: 12.7 g/dL — ABNORMAL LOW (ref 13.0–17.0)
Lymphocytes Relative: 39 %
Lymphs Abs: 4.8 10*3/uL — ABNORMAL HIGH (ref 0.7–4.0)
MCH: 32.9 pg (ref 26.0–34.0)
MCHC: 33.2 g/dL (ref 30.0–36.0)
MCV: 99.2 fL (ref 80.0–100.0)
Monocytes Absolute: 1 10*3/uL (ref 0.1–1.0)
Monocytes Relative: 8 %
Neutro Abs: 6.4 10*3/uL (ref 1.7–7.7)
Neutrophils Relative %: 52 %
Platelets: 140 10*3/uL — ABNORMAL LOW (ref 150–400)
RBC: 3.86 MIL/uL — ABNORMAL LOW (ref 4.22–5.81)
RDW: 13.5 % (ref 11.5–15.5)
WBC: 12.4 10*3/uL — ABNORMAL HIGH (ref 4.0–10.5)
nRBC: 0 % (ref 0.0–0.2)
nRBC: 0 /100 WBC

## 2021-06-10 SURGERY — LOOP RECORDER INSERTION

## 2021-06-10 SURGERY — ECHOCARDIOGRAM, TRANSESOPHAGEAL
Anesthesia: Monitor Anesthesia Care

## 2021-06-10 MED ORDER — LIDOCAINE-EPINEPHRINE 1 %-1:100000 IJ SOLN
INTRAMUSCULAR | Status: DC | PRN
  Administered 2021-06-10: 20 mL

## 2021-06-10 MED ORDER — LIDOCAINE-EPINEPHRINE 1 %-1:100000 IJ SOLN
INTRAMUSCULAR | Status: AC
  Filled 2021-06-10: qty 1

## 2021-06-10 MED ORDER — PROPOFOL 500 MG/50ML IV EMUL
INTRAVENOUS | Status: DC | PRN
  Administered 2021-06-10: 100 ug/kg/min via INTRAVENOUS

## 2021-06-10 MED ORDER — PROPOFOL 10 MG/ML IV BOLUS
INTRAVENOUS | Status: DC | PRN
  Administered 2021-06-10: 10 mg via INTRAVENOUS

## 2021-06-10 SURGICAL SUPPLY — 2 items
MONITOR REVEAL LINQ II (Prosthesis & Implant Heart) ×1 IMPLANT
PACK LOOP INSERTION (CUSTOM PROCEDURE TRAY) ×2 IMPLANT

## 2021-06-10 NOTE — Discharge Instructions (Signed)
Post procedure wound care instructions Keep incision clean and dry for 3 days. You can remove outer PLASTIC dressing tomorrow. Leave steri-strips (little pieces of tape) on until seen in the office for wound check appointment. Call the office [phone]) for redness, drainage, swelling, or fever.

## 2021-06-10 NOTE — Progress Notes (Addendum)
STROKE TEAM PROGRESS NOTE   SUBJECTIVE (INTERVAL HISTORY) No family is at the bedside.  .  Sitting up comfortably in bed and just returned from TEE which shows as tiny PFO.  No clot..  No complaints.   .     Vital signs stable and neurological exam unchanged.  Awaits loop recorder insertion   OBJECTIVE Temp:  [97.3 F (36.3 C)-98.8 F (37.1 C)] 98 F (36.7 C) (01/16 1102) Pulse Rate:  [75-103] 75 (01/16 1102) Cardiac Rhythm: Normal sinus rhythm (01/16 0902) Resp:  [14-24] 18 (01/16 1102) BP: (89-121)/(67-90) 113/69 (01/16 1102) SpO2:  [92 %-99 %] 99 % (01/16 1102) Weight:  [81.8 kg] 81.8 kg (01/16 0740)  No results for input(s): GLUCAP in the last 168 hours. Recent Labs  Lab 06/03/21 1633 06/04/21 0418 06/05/21 0603 06/10/21 0153  NA 141 139 144 136  K 4.0 3.7 3.5 4.1  CL 109 108 112* 105  CO2 26 25 23 23   GLUCOSE 107* 106* 85 118*  BUN 19 16 10 14   CREATININE 2.13* 1.47* 1.11 1.33*  CALCIUM 9.0 8.4* 8.5* 8.9   Recent Labs  Lab 06/03/21 1842  AST 28  ALT 22  ALKPHOS 65  BILITOT 0.8  PROT 8.3*  ALBUMIN 4.1   Recent Labs  Lab 06/03/21 1633 06/10/21 0153  WBC 10.3 12.4*  NEUTROABS 4.6 6.4  HGB 14.2 12.7*  HCT 43.4 38.3*  MCV 100.5* 99.2  PLT 183 140*   No results for input(s): CKTOTAL, CKMB, CKMBINDEX, TROPONINI in the last 168 hours. No results for input(s): LABPROT, INR in the last 72 hours. No results for input(s): COLORURINE, LABSPEC, [place], GLUCOSEU, HGBUR, BILIRUBINUR, KETONESUR, PROTEINUR, UROBILINOGEN, NITRITE, LEUKOCYTESUR in the last 72 hours.  Invalid input(s): APPERANCEUR      Component Value Date/Time   CHOL 117 06/05/2021 0328   TRIG 104 06/05/2021 0328   HDL 33 (L) 06/05/2021 0328   CHOLHDL 3.5 06/05/2021 0328   VLDL 21 06/05/2021 0328   LDLCALC 63 06/05/2021 0328   Lab Results  Component Value Date   HGBA1C 4.7 (L) 06/05/2021      Component Value Date/Time   LABOPIA NONE DETECTED 06/04/2021 0030   COCAINSCRNUR NONE DETECTED  06/04/2021 0030   LABBENZ NONE DETECTED 06/04/2021 0030   AMPHETMU NONE DETECTED 06/04/2021 0030   THCU NONE DETECTED 06/04/2021 0030   LABBARB NONE DETECTED 06/04/2021 0030    Recent Labs  Lab 06/03/21 1842  ETH <10    I have personally reviewed the radiological images below and agree with the radiology interpretations.  DG Chest 2 View  Result Date: 06/03/2021 CLINICAL DATA:  Intermittent slurred speech with frequent falls and confusion EXAM: CHEST - 2 VIEW COMPARISON:  Chest x-ray 12/28/2020, CT 09/09/2020 FINDINGS: The heart size and mediastinal contours are within normal limits. Both lungs are clear. Severe chronic compression fracture at T12. IMPRESSION: No active cardiopulmonary disease. Electronically Signed   By: Donavan Foil M.D.   On: 06/03/2021 15:49   CT Head Wo Contrast  Result Date: 06/03/2021 CLINICAL DATA:  Mental status change. EXAM: CT HEAD WITHOUT CONTRAST TECHNIQUE: Contiguous axial images were obtained from the base of the skull through the vertex without intravenous contrast. COMPARISON:  11/27/2018 FINDINGS: Brain: No acute intracranial hemorrhage. No focal mass lesion. No CT evidence of acute infarction. No midline shift or mass effect. No hydrocephalus. Basilar cisterns are patent. Remote small RIGHT frontal cortical and subcortical infarction. Extensive periventricular and subcortical white matter hypodensities. Generalized cortical atrophy. Vascular: No  hyperdense vessel or unexpected calcification. Skull: Normal. Negative for fracture or focal lesion. Sinuses/Orbits: Paranasal sinuses and mastoid air cells are clear. Orbits are clear. Other: None. IMPRESSION: 1. No acute intracranial findings. 2. 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 3. Remote RIGHT frontal infarction. Electronically Signed   By: Suzy Bouchard M.D.   On: 06/03/2021 16:34   MR ANGIO HEAD WO CONTRAST  Result Date: 06/04/2021 CLINICAL DATA:  Initial evaluation for acute  stroke. EXAM: MRA NECK WITHOUT AND WITH CONTRAST MRA HEAD WITHOUT CONTRAST TECHNIQUE: Multiplanar and multiecho pulse sequences of the neck were obtained without and with intravenous contrast. Angiographic images of the neck were obtained using MRA technique without and with intravenous contrast; Angiographic images of the Circle of Willis were obtained using MRA technique without intravenous contrast. CONTRAST:  9 cc of Gadavist. COMPARISON:  Previous MRI from earlier the same day. FINDINGS: MRA NECK FINDINGS AORTIC ARCH: Partially visualized aortic arch normal in caliber with normal branch pattern. No stenosis seen about the origin of the great vessels. RIGHT CAROTID SYSTEM: Right common and internal carotid arteries patent without stenosis or dissection. No significant atheromatous narrowing about the right carotid bulb. LEFT CAROTID SYSTEM: Left common and internal carotid arteries patent without stenosis or dissection. No significant atheromatous narrowing about the left carotid wall. VERTEBRAL ARTERIES: Both vertebral arteries arise from the subclavian arteries. Right vertebral artery slightly dominant. No visible proximal subclavian artery stenosis. Vertebral arteries patent without stenosis or evidence for dissection. MRA HEAD FINDINGS ANTERIOR CIRCULATION: Both internal carotid arteries widely patent to the termini without stenosis or other abnormality. A1 segments widely patent. Normal anterior communicating artery complex. Anterior cerebral arteries widely patent without stenosis. No M1 stenosis or occlusion. Normal MCA bifurcations. Distal MCA branches well perfused and symmetric. POSTERIOR CIRCULATION: Visualized V4 segments widely patent bilaterally. Right vertebral artery dominant. Partially visualized PICA patent bilaterally. Basilar patent to its distal aspect without stenosis. Superior cerebellar are and posterior cerebral arteries widely patent bilaterally. Small bilateral posterior communicating  arteries noted. Anatomic variants: None significant.  No aneurysm. IMPRESSION: Negative MRA of the head and neck. No large vessel occlusion. No hemodynamically significant or correctable stenosis. Electronically Signed   By: Jeannine Boga M.D.   On: 06/04/2021 00:30   MR Angiogram Neck W or Wo Contrast  Result Date: 06/04/2021 CLINICAL DATA:  Initial evaluation for acute stroke. EXAM: MRA NECK WITHOUT AND WITH CONTRAST MRA HEAD WITHOUT CONTRAST TECHNIQUE: Multiplanar and multiecho pulse sequences of the neck were obtained without and with intravenous contrast. Angiographic images of the neck were obtained using MRA technique without and with intravenous contrast; Angiographic images of the Circle of Willis were obtained using MRA technique without intravenous contrast. CONTRAST:  9 cc of Gadavist. COMPARISON:  Previous MRI from earlier the same day. FINDINGS: MRA NECK FINDINGS AORTIC ARCH: Partially visualized aortic arch normal in caliber with normal branch pattern. No stenosis seen about the origin of the great vessels. RIGHT CAROTID SYSTEM: Right common and internal carotid arteries patent without stenosis or dissection. No significant atheromatous narrowing about the right carotid bulb. LEFT CAROTID SYSTEM: Left common and internal carotid arteries patent without stenosis or dissection. No significant atheromatous narrowing about the left carotid wall. VERTEBRAL ARTERIES: Both vertebral arteries arise from the subclavian arteries. Right vertebral artery slightly dominant. No visible proximal subclavian artery stenosis. Vertebral arteries patent without stenosis or evidence for dissection. MRA HEAD FINDINGS ANTERIOR CIRCULATION: Both internal carotid arteries widely patent to the termini without stenosis  or other abnormality. A1 segments widely patent. Normal anterior communicating artery complex. Anterior cerebral arteries widely patent without stenosis. No M1 stenosis or occlusion. Normal MCA  bifurcations. Distal MCA branches well perfused and symmetric. POSTERIOR CIRCULATION: Visualized V4 segments widely patent bilaterally. Right vertebral artery dominant. Partially visualized PICA patent bilaterally. Basilar patent to its distal aspect without stenosis. Superior cerebellar are and posterior cerebral arteries widely patent bilaterally. Small bilateral posterior communicating arteries noted. Anatomic variants: None significant.  No aneurysm. IMPRESSION: Negative MRA of the head and neck. No large vessel occlusion. No hemodynamically significant or correctable stenosis. Electronically Signed   By: Jeannine Boga M.D.   On: 06/04/2021 00:30   MR Brain Wo Contrast (neuro protocol)  Result Date: 06/03/2021 CLINICAL DATA:  Initial evaluation for neuro deficit, stroke suspected./ EXAM: MRI HEAD WITHOUT CONTRAST TECHNIQUE: Multiplanar, multiecho pulse sequences of the brain and surrounding structures were obtained without intravenous contrast. COMPARISON:  Head CT from earlier the same day. FINDINGS: Brain: Cerebral volume within normal limits for age. Patchy and confluent T2/FLAIR hyperintensity involving the periventricular deep white matter both cerebral hemispheres most consistent with chronic small vessel ischemic disease, moderate to advanced in nature. Multiple scattered remote lacunar infarcts present about the bilateral basal ganglia/hemispheric cerebral white matter. Chronic right MCA territory infarct noted at the high right frontal lobe. Additional chronic left cerebellar infarcts noted. Additional small areas of encephalomalacia and gliosis involving the anterior frontal poles favored to be related to remote trauma. 12 mm acute ischemic infarcts seen involving the right pons (series 5, image 62). Additional scattered acute ischemic infarcts seen involving the cortical and subcortical aspect of the left frontal, parietal, and occipital regions, consistent with acute ischemic infarcts as  well (series 5, image 84). These are watershed in distribution. No associated hemorrhage or significant mass effect about these areas of infarction. Otherwise, gray-white matter differentiation maintained. No areas of acute or chronic intracranial hemorrhage. No mass lesion, midline shift or mass effect. No hydrocephalus or extra-axial fluid collection. Pituitary gland suprasellar region normal. Midline structures intact. Vascular: Major intracranial vascular flow voids are maintained. Skull and upper cervical spine: Craniocervical junction within normal limits. Bone marrow signal intensity normal. No scalp soft tissue abnormality. Sinuses/Orbits: Globes and orbital soft tissues demonstrate no acute finding. Mild scattered mucosal thickening noted within the ethmoidal air cells. Paranasal sinuses are otherwise clear. Trace right mastoid effusion noted, of doubtful significance. Other: None. IMPRESSION: 1. 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 2. Additional scattered acute ischemic nonhemorrhagic infarcts involving the cortical and subcortical aspect of the left frontal, parietal, and occipital lobes. These are watershed in distribution. 3. Underlying moderate to advanced chronic microvascular ischemic disease with multiple remote infarcts involving the right frontal lobe, bilateral basal ganglia/hemispheric cerebral white matter, left cerebellum. 4. Additional small areas of encephalomalacia involving the anterior frontal lobes bilaterally, likely related to remote trauma. Electronically Signed   By: Jeannine Boga M.D.   On: 06/03/2021 20:09   VAS [place] TRANSCRANIAL DOPPLER W BUBBLES  Result Date: 06/10/2021  Transcranial Doppler with Bubble Patient Name:  ORIS CUSATO  Date of Exam:   06/08/2021 Medical Rec #: [id number]        Accession #:    [id number] Date of Birth: Mar 11, 1956         Patient Gender: M Patient Age:   33 years Exam Location:  [facility] Procedure:      VAS [place]  TRANSCRANIAL [place] Referring Phys: Janine Ores --------------------------------------------------------------------------------  Indications: MRI brain showing 12 mm acute ischemic nonhemorrhagic right pontine infarct. Additional scattered acute ischemic nonhemorrhagic infarcts involving the cortical and subcortical aspect of the left frontal, parietal, and occipital lobes. These are watershed in distribution. History: Slurred speech, falls, and confusion x1 week. Limitations for diagnostic windows: Unable to insonate right transtemporal window. Unable to insonate left transtemporal window. Comparison Study: No prior study Performing Technologist: Sharion Dove RVS  Examination Guidelines: A complete evaluation includes B-mode imaging, spectral Doppler, color Doppler, and power Doppler as needed of all accessible portions of each vessel. Bilateral testing is considered an integral part of a complete examination. Limited examinations for reoccurring indications may be performed as noted.  Summary:  A vascular evaluation was performed. The right Opthalmic was studied. An IV was inserted into the patient's right forearm. Verbal informed consent was obtained.  Minimal hits noted with Valsalva. No significant findings. *See table(s) above for TCD measurements and observations.  Diagnosing physician: Antony Contras MD Electronically signed by Antony Contras MD on 06/10/2021 at 12:09:11 PM.    Final    ECHOCARDIOGRAM COMPLETE  Result Date: 06/04/2021    ECHOCARDIOGRAM REPORT   Patient Name:   KENTAVIUS HOEFLING Date of Exam: 06/04/2021 Medical Rec #:  [id number]       Height:       71.0 in Accession #:    [id number]      Weight:       180.0 lb Date of Birth:  February 08, 1956        BSA:          2.016 m Patient Age:    83 years        BP:           133/68 mmHg Patient Gender: M               HR:           58 bpm. Exam Location:  Inpatient Procedure: 2D Echo, Cardiac Doppler and Color Doppler Indications:    Stroke   History:        Patient has prior history of Echocardiogram examinations.  Sonographer:    Jyl Heinz Referring Phys: [place]arc[place]ie Fourche  1. Left ventricular ejection fraction, by estimation, is 55 to 60%. The left ventricle has normal function. The left ventricle has no regional wall motion abnormalities. There is mild concentric left ventricular hypertrophy. Left ventricular diastolic parameters were normal.  2. Right ventricular systolic function is normal. The right ventricular size is normal. There is normal pulmonary artery systolic pressure.  3. Left atrial size was mildly dilated.  4. The mitral valve is normal in structure. Trivial mitral valve regurgitation. No evidence of mitral stenosis.  5. The aortic valve is tricuspid. Aortic valve regurgitation is mild. No aortic stenosis is present.  6. Aortic dilatation noted. There is moderate dilatation of the aortic root, measuring 44 mm.  7. The inferior vena cava is normal in size with greater than 50% respiratory variability, suggesting right atrial pressure of 3 mmHg. Comparison(s): No significant change from prior study. Prior images reviewed side by side. FINDINGS  Left Ventricle: Left ventricular ejection fraction, by estimation, is 55 to 60%. The left ventricle has normal function. The left ventricle has no regional wall motion abnormalities. The left ventricular internal cavity size was normal in size. There is  mild concentric left ventricular hypertrophy. Left ventricular diastolic parameters were normal. Normal left ventricular filling pressure. Right Ventricle: The right ventricular size is normal. No  increase in right ventricular wall thickness. Right ventricular systolic function is normal. There is normal pulmonary artery systolic pressure. The tricuspid regurgitant velocity is 2.50 m/s, and  with an assumed right atrial pressure of 3 mmHg, the estimated right ventricular systolic pressure is [id number] mmHg. Left Atrium: Left atrial  size was mildly dilated. Right Atrium: Right atrial size was normal in size. Pericardium: There is no evidence of pericardial effusion. Mitral Valve: The mitral valve is normal in structure. Trivial mitral valve regurgitation. No evidence of mitral valve stenosis. Tricuspid Valve: The tricuspid valve is normal in structure. Tricuspid valve regurgitation is trivial. No evidence of tricuspid stenosis. Aortic Valve: The aortic valve is tricuspid. Aortic valve regurgitation is mild. Aortic regurgitation PHT measures 605 msec. No aortic stenosis is present. Aortic valve peak gradient measures 3.6 mmHg. Pulmonic Valve: The pulmonic valve was grossly normal. Pulmonic valve regurgitation is mild. No evidence of pulmonic stenosis. Aorta: Aortic dilatation noted. There is moderate dilatation of the aortic root, measuring 44 mm. Venous: The inferior vena cava is normal in size with greater than 50% respiratory variability, suggesting right atrial pressure of 3 mmHg. IAS/Shunts: No atrial level shunt detected by color flow Doppler.  LEFT VENTRICLE PLAX 2D LVIDd:         4.50 cm      Diastology LVIDs:         3.20 cm      LV e' medial:    6.74 cm/s LV PW:         1.20 cm      LV E/e' medial:  11.4 LV IVS:        1.20 cm      LV e' lateral:   9.03 cm/s LVOT diam:     2.30 cm      LV E/e' lateral: 8.5 LV SV:         84 LV SV Index:   42 LVOT Area:     4.15 cm  LV Volumes (MOD) LV vol d, MOD A2C: 123.0 ml LV vol d, MOD A4C: 151.0 ml LV vol s, MOD A2C: 53.0 ml LV vol s, MOD A4C: 63.7 ml LV SV MOD A2C:     70.0 ml LV SV MOD A4C:     151.0 ml LV SV MOD BP:      77.8 ml RIGHT VENTRICLE             IVC RV Basal diam:  3.80 cm     IVC diam: 2.00 cm RV Mid diam:    3.50 cm RV S prime:     10.30 cm/s TAPSE (M-mode): 2.1 cm LEFT ATRIUM             Index        RIGHT ATRIUM           Index LA diam:        3.80 cm 1.88 cm/m   RA Area:     12.30 cm LA Vol (A2C):   67.2 ml 33.33 ml/m  RA Volume:   20.60 ml  10.22 ml/m LA Vol (A4C):   64.2  ml 31.84 ml/m LA Biplane Vol: 66.1 ml 32.78 ml/m  AORTIC VALVE AV Area (Vmax): 3.81 cm AV Vmax:        94.70 cm/s AV Peak Grad:   3.6 mmHg LVOT Vmax:      86.80 cm/s LVOT Vmean:     62.100 cm/s LVOT VTI:       0.202 m AI PHT:  605 msec  AORTA Ao Root diam: 4.40 cm Ao Asc diam:  3.50 cm MITRAL VALVE               TRICUSPID VALVE MV Area (PHT): 5.02 cm    TR Peak grad:   25.0 mmHg MV Decel Time: 151 msec    TR Vmax:        250.00 cm/s MV E velocity: 77.10 cm/s MV A velocity: 56.60 cm/s  SHUNTS MV E/A ratio:  1.36        Systemic VTI:  0.20 m                            Systemic Diam: 2.30 cm Sanda Klein MD Electronically signed by Sanda Klein MD Signature Date/Time: 06/04/2021/12:56:21 PM    Final    ECHO TEE  Result Date: 06/10/2021    TRANSESOPHOGEAL ECHO REPORT   Patient Name:   MAKYAH SPRENGER Date of Exam: 06/10/2021 Medical Rec #:  [id number]       Height:       71.0 in Accession #:    [id number]      Weight:       180.3 lb Date of Birth:  1955/09/11        BSA:          2.018 m Patient Age:    54 years        BP:           112/77 mmHg Patient Gender: M               HR:           80 bpm. Exam Location:  Inpatient Procedure: Transesophageal Echo, Cardiac Doppler, Color Doppler and Saline            Contrast Bubble Study Indications:     Stroke  History:         Patient has prior history of Echocardiogram examinations, most                  recent 06/04/2021. Risk Factors:Current Smoker.  Sonographer:     Clayton Lefort RDCS (AE) Referring Phys:  1993 RHONDA G BARRETT Diagnosing Phys: Eleonore Chiquito MD PROCEDURE: After discussion of the risks and benefits of a TEE, an informed consent was obtained from the patient. TEE procedure time was 16 minutes. The transesophogeal probe was passed without difficulty through the esophogus of the patient. Imaged were obtained with the patient in a left lateral decubitus position. Local oropharyngeal anesthetic was provided with Cetacaine. Sedation performed by  different physician. The patient was monitored while under deep sedation. Anesthestetic sedation was provided intravenously by Anesthesiology: 175mg  of Propofol. Image quality was excellent. The patient's vital signs; including heart rate, blood pressure, and oxygen saturation; remained stable throughout the procedure. The patient developed no complications during the procedure. IMPRESSIONS  1. Aortic dilatation noted. Aneurysm of the aortic root, measuring 45 mm. There is Moderate (Grade III) layered plaque involving the descending aorta and aortic arch.  2. Equivocal bubble study with trivial bubbles crossing the IAS. Findings possibly represent a trivial PFO. Would defer to transcranial dopplers for interatrial shunt. Agitated saline contrast bubble study was positive with shunting observed within 3-6 cardiac cycles suggestive of interatrial shunt. There is a small patent foramen ovale with predominantly right to left shunting across the atrial septum.  3. No left atrial/left atrial appendage thrombus was detected. The LAA emptying velocity was  75 cm/s.  4. Left ventricular ejection fraction, by estimation, is 60 to 65%. The left ventricle has normal function.  5. Right ventricular systolic function is normal. The right ventricular size is normal.  6. The mitral valve is grossly normal. Trivial mitral valve regurgitation. No evidence of mitral stenosis.  7. The aortic valve is tricuspid. Aortic valve regurgitation is mild. No aortic stenosis is present. FINDINGS  Left Ventricle: Left ventricular ejection fraction, by estimation, is 60 to 65%. The left ventricle has normal function. The left ventricular internal cavity size was normal in size. Right Ventricle: The right ventricular size is normal. No increase in right ventricular wall thickness. Right ventricular systolic function is normal. Left Atrium: Left atrial size was normal in size. No left atrial/left atrial appendage thrombus was detected. The LAA  emptying velocity was 75 cm/s. Right Atrium: Right atrial size was normal in size. Pericardium: Trivial pericardial effusion is present. Mitral Valve: The mitral valve is grossly normal. Trivial mitral valve regurgitation. No evidence of mitral valve stenosis. Tricuspid Valve: The tricuspid valve is grossly normal. Tricuspid valve regurgitation is trivial. No evidence of tricuspid stenosis. Aortic Valve: The aortic valve is tricuspid. Aortic valve regurgitation is mild. No aortic stenosis is present. Pulmonic Valve: The pulmonic valve was grossly normal. Pulmonic valve regurgitation is trivial. No evidence of pulmonic stenosis. Aorta: Aortic dilatation noted. 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 There is moderate (Grade III) layered plaque involving the descending aorta and aortic arch. Venous: The left lower pulmonary vein, left upper pulmonary vein, right upper pulmonary vein and right lower pulmonary vein are normal. IAS/Shunts: There is redundancy of the interatrial septum. The atrial septum is grossly normal. Agitated saline contrast was given intravenously to evaluate for intracardiac shunting. Agitated saline contrast bubble study was positive with shunting observed within 3-6 cardiac cycles suggestive of interatrial shunt. A small patent foramen ovale is detected with predominantly right to left shunting across the atrial septum. Equivocal bubble study with trivial bubbles crossing the IAS. Findings possibly represent a trivial PFO. Would defer to transcranial dopplers for interatrial shunt.   AORTA Ao Root diam: 4.50 cm Ao Asc diam:  3.60 cm Eleonore Chiquito MD Electronically signed by Eleonore Chiquito MD Signature Date/Time: 06/10/2021/10:53:02 AM    Final    VAS [place] LOWER EXTREMITY VENOUS (DVT)  Result Date: 06/06/2021  Lower Venous DVT Study Patient Name:  JAYDIN HENTZ  Date of Exam:   06/06/2021 Medical Rec #: [id number]        Accession #:    [id number] Date of Birth: 1955/10/18          Patient Gender: M Patient Age:   38 years Exam Location:  [facility] Procedure:      VAS [place] LOWER EXTREMITY VENOUS (DVT) Referring Phys: Cornelius Moras XU --------------------------------------------------------------------------------  Indications: Stroke.  Comparison Study: No prior study Performing Technologist: Sharion Dove RVS  Examination Guidelines: A complete evaluation includes B-mode imaging, spectral Doppler, color Doppler, and power Doppler as needed of all accessible portions of each vessel. Bilateral testing is considered an integral part of a complete examination. Limited examinations for reoccurring indications may be performed as noted. The reflux portion of the exam is performed with the patient in reverse Trendelenburg.  +---------+---------------+---------+-----------+----------+--------------+  RIGHT     Compressibility Phasicity Spontaneity Properties Thrombus Aging  +---------+---------------+---------+-----------+----------+--------------+  CFV       Full            Yes  Yes                                    +---------+---------------+---------+-----------+----------+--------------+  SFJ       Full                                                             +---------+---------------+---------+-----------+----------+--------------+  FV Prox   Full                                                             +---------+---------------+---------+-----------+----------+--------------+  FV Mid    Full                                                             +---------+---------------+---------+-----------+----------+--------------+  FV Distal Full                                                             +---------+---------------+---------+-----------+----------+--------------+  PFV       Full                                                             +---------+---------------+---------+-----------+----------+--------------+  POP       Full            Yes       Yes                                     +---------+---------------+---------+-----------+----------+--------------+  PTV       Full                                                             +---------+---------------+---------+-----------+----------+--------------+  PERO      Full                                                             +---------+---------------+---------+-----------+----------+--------------+   +---------+---------------+---------+-----------+----------+--------------+  LEFT      Compressibility Phasicity Spontaneity Properties Thrombus Aging  +---------+---------------+---------+-----------+----------+--------------+  CFV       Full            Yes       Yes                                    +---------+---------------+---------+-----------+----------+--------------+  SFJ       Full                                                             +---------+---------------+---------+-----------+----------+--------------+  FV Prox   Full                                                             +---------+---------------+---------+-----------+----------+--------------+  FV Mid    Full                                                             +---------+---------------+---------+-----------+----------+--------------+  FV Distal Full                                                             +---------+---------------+---------+-----------+----------+--------------+  PFV       Full                                                             +---------+---------------+---------+-----------+----------+--------------+  POP       Full            Yes       Yes                                    +---------+---------------+---------+-----------+----------+--------------+  PTV       Full                                                             +---------+---------------+---------+-----------+----------+--------------+  PERO      Full                                                              +---------+---------------+---------+-----------+----------+--------------+  Summary: BILATERAL: - No evidence of deep vein thrombosis seen in the lower extremities, bilaterally. -No evidence of popliteal cyst, bilaterally.   *See table(s) above for measurements and observations. Electronically signed by Orlie Pollen on 06/06/2021 at 3:56:46 PM.    Final      PHYSICAL EXAM  Temp:  [97.3 F (36.3 C)-98.8 F (37.1 C)] 98 F (36.7 C) (01/16 1102) Pulse Rate:  [75-103] 75 (01/16 1102) Resp:  [14-24] 18 (01/16 1102) BP: (89-121)/(67-90) 113/69 (01/16 1102) SpO2:  [92 %-99 %] 99 % (01/16 1102) Weight:  [81.8 kg] 81.8 kg (01/16 0740)  General - Well nourished, well developed, in no apparent distress.  Ophthalmologic - fundi not visualized due to noncooperation.  Cardiovascular - Regular rhythm and rate.  Mental Status -  Level of arousal and orientation to time, place, and person were intact. Language including expression, naming, repetition, comprehension was assessed and found intact. Fund of Knowledge was assessed and was intact.  Cranial Nerves II - XII - II - Visual field intact OU. III, IV, VI - Extraocular movements intact. V - Facial sensation intact bilaterally. VII - Facial movement intact bilaterally. VIII - Hearing & vestibular intact bilaterally. X - Palate elevates symmetrically. XI - Chin turning & shoulder shrug intact bilaterally. XII - Tongue protrusion intact.  Motor Strength - The patients strength was normal in all extremities and pronator drift was absent except mild right hand grip and dexterity difficulty, however patient stated that was chronic.  Bulk was normal and fasciculations were absent.   Motor Tone - Muscle tone was assessed at the neck and appendages and was normal.  Reflexes - The patients reflexes were symmetrical in all extremities and he had no pathological reflexes.  Sensory - Light touch, temperature/pinprick were assessed and were  symmetrical.    Coordination - The patient had normal movements in the hands and feet with no ataxia or dysmetria.  Tremor was absent.  Gait and Station - deferred.   ASSESSMENT/PLAN Mr. Ryan Montgomery is a 66 y.o. male with history of smoker, hepatitis C, stroke 3 to 4 years ago without residual admitted for slurred speech, fall at home and confusion. No tPA given due to outside window.    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 MRI right pontine and scattered acute ischemic nonhemorrhagic infarcts involving the cortical and subcortical aspect of the left frontal, parietal, and occipital lobes. These are watershed in distribution MRA head and neck unremarkable LE venous Doppler no DVT 2D Echo EF 55 to 60% LDL 73 HgbA1c 4.7 TCD bubble study weakly positive for tiny clinically insignificant PFO. TEE tiny PFO.  No clot ( low ROPE score of 4 and no high risk features on TEE hence medical management for PFO) Hypercoagulable work-up Homocystein 18.0, Lupus anticoagulant negative, ANA wnl, HIV negative, TSH wnl, B12 wnl, RPR negative, remainder of workup pending Recommended TEE and loop recorder.  Plan to have both completed on Monday. Lovenox for VTE prophylaxis No antithrombotic prior to admission, now on aspirin 81 mg daily and clopidogrel 75 mg daily DAPT for 3 weeks and then aspirin alone. Patient counseled to be compliant with his antithrombotic medications Ongoing aggressive stroke risk factor management Therapy recommendations: SNF Disposition: Pending  History of LOC 11/2019 LOC with possible jerking movement EEG negative Concern for seizure, put on Keppra 500 mg twice daily However Keppra was not on his current medication list  No more similar episode since  History of heart palpitation Patient stated that 5 years ago, while he was at work, he had heart  fluttering, heart palpitation for 30 minutes.  He did not seek for medical attention at that time Given currently embolic Stroke, recommend loop recorder to rule out afib  Hypertension stable Long term BP goal normotensive  Lipid management Home meds: None LDL 63, goal < 70 Now on Lipitor 20 No high intensity statin due to LDL at goal Continue statin at discharge  Tobacco abuse Current smoker Smoking cessation counseling provided Nicotine patch provided Pt is willing to quit  Other Stroke Risk Factors Hx stroke/TIA - 3-4 years ago, details not clear, no residual left  Other Active Problems AKI, creatinine 2.13-1.47-1.11 Mild leukocytosis, WBC 10.3  Hospital day # 7 Patient has a small PFO on TEE but this does not seem to be clinically significant and he has a very low ROPE score of 4 due to 38% chance that this is due to PFO.  I recommend maximal medical therapy with antiplatelet therapy and aggressive risk factor modification.  Patient also counseled to quit smoking.  Loop recorder insertion for paroxysmal A. fib prior to discharge today.  Follow-up as an outpatient stroke clinic in 2 months.  Stroke team will sign off.  Kindly call for questions.  Greater than 50% time during this 35-minute visit was spent in counseling and coordination of care and discussion patient care team.  Discussed with Dr. Boykin Peek, MD  Stroke Neurology 06/10/2021 12:45 PM     To contact Stroke Continuity provider, please refer to [email or web address] After hours, contact General Neurology

## 2021-06-10 NOTE — Progress Notes (Signed)
PROGRESS NOTE    Ryan Montgomery  [id number] DOB: [birth year] DOA: 06/03/2021 PCP: Laurey Morale, MD   Chief Complain: Patient is a 66 year old male with history of tobacco abuse, prior stroke with no residual deficits, hepatitis C who presented to the emergency department with complaints of 1 week history of slurred speech, falls.  MRI on presentation showed acute to subacute nonhemorrhagic right pontine infarct, scattered nonhemorrhagic infarcts involving the cortical and subcortical aspect of the left frontoparietal and occipital lobe concerning for watershed distribution.  MRA of the head and neck did not show any significant stenosis.  Suspicion for cardioembolic etiology.  Neurology following.  Plan for TEE/loop recorder placement.  PT/OT evaluation done,recommended SNF.  Brief Narrative:   Assessment & Plan:   Principal Problem:   Acute CVA (cerebrovascular accident) ([place]) Active Problems:   AKI (acute kidney injury) ([place])   Tobacco use   Aortic dilatation ([place])   Dysphagia   Acute ischemic stroke: Presented with 1 week history of slurred speech, falls, weakness.MRI on presentation showed acute to subacute nonhemorrhagic right pontine infarct, scattered nonhemorrhagic infarcts involving the cortical and subcortical aspect of the left frontoparietal and occipital lobe concerning for watershed distribution.  MRA of the head and neck did not show any significant stenosis.  Suspicion for cardioembolic etiology.  Neurology following.  Plan for TEE/loop recorder placement,will be done on Monday.  PT/OT/speech done, recommended skilled nursing facility on discharge. LDL of 63.  Hemoglobin A1c of 4.7.  Continue aspirin 81 mg and Plavix for now.  Started on Lipitor 20 mg daily. Venous Doppler  ruled out DVT Echo showed EF of 55 to 60%, no regional wall motion, mild dilated left atrial size no intra-atrial shunt.  Transcranial Doppler with bubble study did not show any significant  finding.  AKI: resolved.  IV fluid discontinued  Aortic dilation: Seen on echocardiogram.  Recommend outpatient follow-up with cardiology.  Tobacco use: Continue nicotine patch.  Counseled for cessation  History of hepatitis C :status post treatment  Hypertension: Blood pressure stable now . On amlodipine 5 mg daily  Debility/deconditioning: PT/OT recommended skilled NF  on discharge.        DVT prophylaxis:Lovenox Code Status: Full Family Communication: None at the bedside Patient status:Inpatient  Dispo: The patient is from: Home              Anticipated d/c is to:  SNF               Anticipated d/c date is: Pending loop recorder,insurance auth  Procedures: None  Antimicrobials:  Anti-infectives (From admission, onward)    None       Subjective:  Patient seen and examined the bedside this morning.  Hemodynamically stable.  Comfortable without any complaints.  Just came from TEE, waiting for loop   Objective: Vitals:   06/10/21 0845 06/10/21 0855 06/10/21 0902 06/10/21 1102  BP:  (!) 89/71 98/70 113/69  Pulse: 78 77 79 75  Resp: (!) 22 (!) 24 20 18   Temp:   97.7 F (36.5 C) 98 F (36.7 C)  TempSrc:      SpO2: 99% 92% 94% 99%  Weight:      Height:        Intake/Output Summary (Last 24 hours) at 06/10/2021 1145 Last data filed at 06/10/2021 0828 Gross per 24 hour  Intake 540 ml  Output --  Net 540 ml   Filed Weights   06/03/21 1504 06/04/21 2200 06/10/21 0740  Weight: 81.6 kg 81.8 kg  81.8 kg    Examination:  General exam: Overall comfortable, not in distress HEENT: PERRL Respiratory system:  no wheezes or crackles  Cardiovascular system: S1 & S2 heard, RRR.  Gastrointestinal system: Abdomen is nondistended, soft and nontender. Central nervous system: Alert and oriented Extremities: No edema, no clubbing ,no cyanosis Skin: No rashes, no ulcers,no icterus     Data Reviewed: I have personally reviewed following labs and imaging  studies  CBC: Recent Labs  Lab 06/03/21 1633 06/10/21 0153  WBC 10.3 12.4*  NEUTROABS 4.6 6.4  HGB 14.2 12.7*  HCT 43.4 38.3*  MCV 100.5* 99.2  PLT 183 [id number]*   Basic Metabolic Panel: Recent Labs  Lab 06/03/21 1633 06/04/21 0418 06/05/21 0603 06/10/21 0153  NA 141 139 144 136  K 4.0 3.7 3.5 4.1  CL 109 108 112* 105  CO2 26 25 23 23   GLUCOSE 107* 106* 85 118*  BUN 19 16 10 14   CREATININE 2.13* 1.47* 1.11 1.33*  CALCIUM 9.0 8.4* 8.5* 8.9   GFR: Estimated Creatinine Clearance: 59.8 mL/min (A) (by C-G formula based on SCr of 1.33 mg/dL (H)). Liver Function Tests: Recent Labs  Lab 06/03/21 1842  AST 28  ALT 22  ALKPHOS 65  BILITOT 0.8  PROT 8.3*  ALBUMIN 4.1   No results for input(s): LIPASE, AMYLASE in the last 168 hours. No results for input(s): AMMONIA in the last 168 hours. Coagulation Profile: No results for input(s): INR, PROTIME in the last 168 hours. Cardiac Enzymes: No results for input(s): CKTOTAL, CKMB, CKMBINDEX, TROPONINI in the last 168 hours. BNP (last 3 results) No results for input(s): PROBNP in the last 8760 hours. HbA1C: No results for input(s): HGBA1C in the last 72 hours.  CBG: No results for input(s): GLUCAP in the last 168 hours. Lipid Profile: No results for input(s): CHOL, HDL, LDLCALC, TRIG, CHOLHDL, LDLDIRECT in the last 72 hours.  Thyroid Function Tests: No results for input(s): TSH, T4TOTAL, FREET4, T3FREE, THYROIDAB in the last 72 hours.  Anemia Panel: No results for input(s): VITAMINB12, FOLATE, FERRITIN, TIBC, IRON, RETICCTPCT in the last 72 hours.  Sepsis Labs: No results for input(s): PROCALCITON, LATICACIDVEN in the last 168 hours.  Recent Results (from the past 240 hour(s))  Resp Panel by RT-PCR (Flu A&B, Covid) Nasopharyngeal Swab     Status: None   Collection Time: 06/03/21  6:42 PM   Specimen: Nasopharyngeal Swab; Nasopharyngeal(NP) swabs in vial transport medium  Result Value Ref Range Status   SARS Coronavirus  2 by RT PCR NEGATIVE NEGATIVE Final    Comment: (NOTE) SARS-CoV-2 target nucleic acids are NOT DETECTED.  The SARS-CoV-2 RNA is generally detectable in upper respiratory specimens during the acute phase of infection. The lowest concentration of SARS-CoV-2 viral copies this assay can detect is 138 copies/mL. A negative result does not preclude SARS-Cov-2 infection and should not be used as the sole basis for treatment or other patient management decisions. A negative result may occur with  improper specimen collection/handling, submission of specimen other than nasopharyngeal swab, presence of viral mutation(s) within the areas targeted by this assay, and inadequate number of viral copies(<138 copies/mL). A negative result must be combined with clinical observations, patient history, and epidemiological information. The expected result is Negative.  Fact Sheet for Patients:  EntrepreneurPulse.com.au  Fact Sheet for Healthcare Providers:  IncredibleEmployment.be  This test is no t yet approved or cleared by the [place] FDA and  has been authorized for detection and/or diagnosis of SARS-CoV-2 by FDA  under an Emergency Use Authorization (EUA). This EUA will remain  in effect (meaning this test can be used) for the duration of the COVID-19 declaration under Section 564(b)(1) of the Act, 21 U.S.C.section 360bbb-3(b)(1), unless the authorization is terminated  or revoked sooner.       Influenza A by PCR NEGATIVE NEGATIVE Final   Influenza B by PCR NEGATIVE NEGATIVE Final    Comment: (NOTE) The Xpert Xpress SARS-CoV-2/FLU/RSV plus assay is intended as an aid in the diagnosis of influenza from Nasopharyngeal swab specimens and should not be used as a sole basis for treatment. Nasal washings and aspirates are unacceptable for Xpert Xpress SARS-CoV-2/FLU/RSV testing.  Fact Sheet for Patients: EntrepreneurPulse.com.au  Fact  Sheet for Healthcare Providers: IncredibleEmployment.be  This test is not yet approved or cleared by the [place] FDA and has been authorized for detection and/or diagnosis of SARS-CoV-2 by FDA under an Emergency Use Authorization (EUA). This EUA will remain in effect (meaning this test can be used) for the duration of the COVID-19 declaration under Section 564(b)(1) of the Act, 21 U.S.C. section 360bbb-3(b)(1), unless the authorization is terminated or revoked.  Performed at [facility], [place] [address], [place], [place] [address]          Radiology Studies: VAS [place] TRANSCRANIAL DOPPLER W BUBBLES  Result Date: 06/08/2021  Transcranial Doppler with Bubble Patient Name:  FREDRICH HEISSER  Date of Exam:   06/08/2021 Medical Rec #: [id number]        Accession #:    [id number] Date of Birth: 01/09/1956         Patient Gender: M Patient Age:   54 years Exam Location:  [facility] Procedure:      VAS [place] TRANSCRANIAL DOPPLER W BUBBLES Referring Phys: Janine Ores --------------------------------------------------------------------------------  Indications: MRI brain showing 12 mm acute ischemic nonhemorrhagic right pontine infarct. Additional scattered acute ischemic nonhemorrhagic infarcts involving the cortical and subcortical aspect of the left frontal, parietal, and occipital lobes. These are watershed in distribution. History: Slurred speech, falls, and confusion x1 week. Limitations for diagnostic windows: Unable to insonate right transtemporal window. Unable to insonate left transtemporal window. Comparison Study: No prior study Performing Technologist: Sharion Dove RVS  Examination Guidelines: A complete evaluation includes B-mode imaging, spectral Doppler, color Doppler, and power Doppler as needed of all accessible portions of each vessel. Bilateral testing is considered an integral part of a complete examination. Limited examinations for  reoccurring indications may be performed as noted.  Summary:  A vascular evaluation was performed. The right Opthalmic was studied. An IV was inserted into the patient's right forearm. Verbal informed consent was obtained.  Minimal hits noted with Valsalva. No significant findings. *See table(s) above for TCD measurements and observations.    Preliminary    ECHO TEE  Result Date: 06/10/2021    TRANSESOPHOGEAL ECHO REPORT   Patient Name:   AUGUSTA PROPSON Date of Exam: 06/10/2021 Medical Rec #:  [id number]       Height:       71.0 in Accession #:    [id number]      Weight:       180.3 lb Date of Birth:  [birth year]        BSA:          2.018 m Patient Age:    26 years        BP:           112/77 mmHg Patient Gender: M  HR:           80 bpm. Exam Location:  Inpatient Procedure: Transesophageal Echo, Cardiac Doppler, Color Doppler and Saline            Contrast Bubble Study Indications:     Stroke  History:         Patient has prior history of Echocardiogram examinations, most                  recent 06/04/2021. Risk Factors:Current Smoker.  Sonographer:     Clayton Lefort RDCS (AE) Referring Phys:  [id number] RHONDA G BARRETT Diagnosing Phys: Eleonore Chiquito MD PROCEDURE: After discussion of the risks and benefits of a TEE, an informed consent was obtained from the patient. TEE procedure time was 16 minutes. The transesophogeal probe was passed without difficulty through the esophogus of the patient. Imaged were obtained with the patient in a left lateral decubitus position. Local oropharyngeal anesthetic was provided with Cetacaine. Sedation performed by different physician. The patient was monitored while under deep sedation. Anesthestetic sedation was provided intravenously by Anesthesiology: 175mg  of Propofol. Image quality was excellent. The patient's vital signs; including heart rate, blood pressure, and oxygen saturation; remained stable throughout the procedure. The patient developed no complications during  the procedure. IMPRESSIONS  1. Aortic dilatation noted. Aneurysm of the aortic root, measuring 45 mm. There is Moderate (Grade III) layered plaque involving the descending aorta and aortic arch.  2. Equivocal bubble study with trivial bubbles crossing the IAS. Findings possibly represent a trivial PFO. Would defer to transcranial dopplers for interatrial shunt. Agitated saline contrast bubble study was positive with shunting observed within 3-6 cardiac cycles suggestive of interatrial shunt. There is a small patent foramen ovale with predominantly right to left shunting across the atrial septum.  3. No left atrial/left atrial appendage thrombus was detected. The LAA emptying velocity was 75 cm/s.  4. Left ventricular ejection fraction, by estimation, is 60 to 65%. The left ventricle has normal function.  5. Right ventricular systolic function is normal. The right ventricular size is normal.  6. The mitral valve is grossly normal. Trivial mitral valve regurgitation. No evidence of mitral stenosis.  7. The aortic valve is tricuspid. Aortic valve regurgitation is mild. No aortic stenosis is present. FINDINGS  Left Ventricle: Left ventricular ejection fraction, by estimation, is 60 to 65%. The left ventricle has normal function. The left ventricular internal cavity size was normal in size. Right Ventricle: The right ventricular size is normal. No increase in right ventricular wall thickness. Right ventricular systolic function is normal. Left Atrium: Left atrial size was normal in size. No left atrial/left atrial appendage thrombus was detected. The LAA emptying velocity was 75 cm/s. Right Atrium: Right atrial size was normal in size. Pericardium: Trivial pericardial effusion is present. Mitral Valve: The mitral valve is grossly normal. Trivial mitral valve regurgitation. No evidence of mitral valve stenosis. Tricuspid Valve: The tricuspid valve is grossly normal. Tricuspid valve regurgitation is trivial. No evidence of  tricuspid stenosis. Aortic Valve: The aortic valve is tricuspid. Aortic valve regurgitation is mild. No aortic stenosis is present. Pulmonic Valve: The pulmonic valve was grossly normal. Pulmonic valve regurgitation is trivial. No evidence of pulmonic stenosis. Aorta: Aortic dilatation noted. 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 There is moderate (Grade III) layered plaque involving the descending aorta and aortic arch. Venous: The left lower pulmonary vein, left upper pulmonary vein, right upper pulmonary vein and right lower pulmonary vein are  normal. IAS/Shunts: There is redundancy of the interatrial septum. The atrial septum is grossly normal. Agitated saline contrast was given intravenously to evaluate for intracardiac shunting. Agitated saline contrast bubble study was positive with shunting observed within 3-6 cardiac cycles suggestive of interatrial shunt. A small patent foramen ovale is detected with predominantly right to left shunting across the atrial septum. Equivocal bubble study with trivial bubbles crossing the IAS. Findings possibly represent a trivial PFO. Would defer to transcranial dopplers for interatrial shunt.   AORTA Ao Root diam: 4.50 cm Ao Asc diam:  3.60 cm Eleonore Chiquito MD Electronically signed by Eleonore Chiquito MD Signature Date/Time: 06/10/2021/10:53:02 AM    Final         Scheduled Meds:  amLODipine  5 mg Oral Daily   aspirin EC  81 mg Oral Daily   atorvastatin  20 mg Oral Daily   clopidogrel  75 mg Oral Daily   enoxaparin (LOVENOX) injection  40 mg Subcutaneous [id number]   folic acid  1 mg Oral Daily   nicotine  14 mg Transdermal Daily   tamsulosin  0.4 mg Oral Daily   Continuous Infusions:      LOS: 7 days        Shelly Coss, MD [facility] P1/16/2023, 11:45 AM

## 2021-06-10 NOTE — Progress Notes (Signed)
PT Cancellation Note  Patient Details Name: Ryan Montgomery MRN: [id number] DOB: 01-01-1956   Cancelled Treatment:    Reason Eval/Treat Not Completed: Patient at procedure or test/unavailable  Patient being rolled out of room to go for procedure.    Jerolyn Center, PT Acute Rehabilitation Services  Pager [phone] Office [phone]  Zena Amos 06/10/2021, 3:41 PM

## 2021-06-10 NOTE — TOC Progression Note (Addendum)
Transition of Care [facility]) - Progression Note    Patient Details  Name: Antonio Plona MRN: [id number] Date of Birth: 11-11-1955  Transition of Care [facility]) CM/SW Contact  Pollie Friar, RN Phone Number: 06/10/2021, 2:21 PM  Clinical Narrative:    CM called and left voicemail for : Driscilla Grammes, [facility], [place].   [facility] states they didn't receive the referral: re-faxed.   Unable to reach: [facility] and [place].  [facility] has declined.    Expected Discharge Plan: Black Barriers to Discharge: Continued Medical Work up, Unsafe home situation  Expected Discharge Plan and Services Expected Discharge Plan: [place] In-house Referral: Clinical Social Work   Post Acute Care Choice: [place] Living arrangements for the past 2 months: Apartment                                       Social Determinants of Health (SDOH) Interventions    Readmission Risk Interventions Readmission Risk Prevention Plan 11/29/2019  Post Dischage Appt Complete  Medication Screening Complete  Transportation Screening Complete  Some recent data might be hidden

## 2021-06-10 NOTE — Progress Notes (Signed)
°  Echocardiogram Echocardiogram Transesophageal has been performed.  Gerda Diss 06/10/2021, 8:53 AM

## 2021-06-10 NOTE — Progress Notes (Signed)
PT Cancellation Note  Patient Details Name: Allex Lapoint MRN: [id number] DOB: 10/05/1955   Cancelled Treatment:    Reason Eval/Treat Not Completed: Patient currently off unit at procedure or test/unavailable. Will check back as schedule allows to continue with PT POC.    Marylynn Pearson 06/10/2021, 8:43 AM  Conni Slipper, PT, DPT Acute Rehabilitation Services Pager: [phone] Office: [phone]

## 2021-06-10 NOTE — Anesthesia Postprocedure Evaluation (Signed)
Anesthesia Post Note  Patient: Ryan Montgomery  Procedure(s) Performed: TRANSESOPHAGEAL ECHOCARDIOGRAM (TEE) BUBBLE STUDY     Patient location during evaluation: PACU Anesthesia Type: MAC Level of consciousness: awake and alert Pain management: pain level controlled Vital Signs Assessment: post-procedure vital signs reviewed and stable Respiratory status: spontaneous breathing, nonlabored ventilation and respiratory function stable Cardiovascular status: blood pressure returned to baseline and stable Postop Assessment: no apparent nausea or vomiting Anesthetic complications: no   No notable events documented.  Last Vitals:  Vitals:   06/10/21 0855 06/10/21 0902  BP: (!) 89/71 98/70  Pulse: 77 79  Resp: (!) 24 20  Temp:    SpO2: 92% 94%    Last Pain:  Vitals:   06/10/21 0840  TempSrc:   PainSc: 0-No pain                 Pervis Hocking

## 2021-06-10 NOTE — Progress Notes (Signed)
Occupational Therapy Treatment Patient Details Name: Ryan Montgomery MRN: [id number] DOB: 11/28/1955 Today's Date: 06/10/2021   History of present illness Ryan Montgomery is a 66 y.o. male presenting with syncopal episode with jerking motions, urinary incontinence and LOC. THC in UDS. PMH is significant for treated hepatitis C and gout, h/o stroke, h/o alcohol abuse, L hip sx in 08/2019.   OT comments  [name] is progressing well towards his acute OT goals. He was able to complete all functional mobility with SPC, and grooming while standing at the sink with distant supervision. He continues to have some strength and dexterity impairments with his hand, and required increased time and effort to manage grooming items. His cognition was overall WFL for ADLs, but he is limited by executive function and higher level cognition. D/c plan remains appropriate, OT to continue to follow acutely.    Recommendations for follow up therapy are one component of a multi-disciplinary discharge planning process, led by the attending physician.  Recommendations may be updated based on patient status, additional functional criteria and insurance authorization.    Follow Up Recommendations  Skilled nursing-short term rehab (<3 hours/day)    Assistance Recommended at Discharge Frequent or constant Supervision/Assistance  Patient can return home with the following  Assist for transportation;Help with stairs or ramp for entrance;Assistance with cooking/housework;Direct supervision/assist for financial management;Direct supervision/assist for medications management   Equipment Recommendations  BSC/3in1       Precautions / Restrictions Precautions Precautions: Fall Restrictions Weight Bearing Restrictions: No       Mobility Bed Mobility Overal bed mobility: Modified Independent                  Transfers Overall transfer level: Needs assistance Equipment used: Straight cane Transfers: Sit to/from  Stand Sit to Stand: Supervision           General transfer comment: for safety only     Balance Overall balance assessment: Needs assistance Sitting-balance support: Feet supported Sitting balance-Leahy Scale: Good     Standing balance support: No upper extremity supported;During functional activity Standing balance-Leahy Scale: Fair Standing balance comment: stood for ~8 minutes at the sink                           ADL either performed or assessed with clinical judgement   ADL Overall ADL's : Needs assistance/impaired     Grooming: Wash/dry hands;Wash/dry face;Oral care;Standing;Supervision/safety Grooming Details (indicate cue type and reason): close supervision at the sink. pt with some difficulty with toothpaste, but overall WFL                             Functional mobility during ADLs: Supervision/safety;Cane General ADL Comments: pt was steady on his feet this session with use of SPC in his R hand. completed grooming at the sink with supervision for safety only. Pt continues to be limited by higher level cog    Extremity/Trunk Assessment Upper Extremity Assessment RUE Deficits / Details: pt able to use functionally for packaging and oral hygiene at the sink. Uses SPC in R hand RUE Coordination: decreased fine motor LUE Deficits / Details: grossly 4-/5, mild difficulty with tooth paste tube, however able to complete with slightly increased effort LUE Sensation: WNL LUE Coordination: decreased fine motor   Lower Extremity Assessment Lower Extremity Assessment: Defer to PT evaluation        Vision   Vision Assessment?: No apparent  visual deficits   Perception Perception Perception: Within Functional Limits       Cognition Arousal/Alertness: Awake/alert Behavior During Therapy: WFL for tasks assessed/performed;Flat affect Overall Cognitive Status: No family/caregiver present to determine baseline cognitive functioning                  General Comments: pt's cognition was [facility] for ADLs assessed this session. However he continues to demonstrate poor executive functioning                General Comments VSS on RA, pt denies any pain or discomfort    Pertinent Vitals/ Pain       Pain Assessment: No/denies pain Pain Intervention(s): Monitored during session   Frequency  Min 2X/week        Progress Toward Goals  OT Goals(current goals can now be found in the care plan section)  Progress towards OT goals: Progressing toward goals  Acute Rehab OT Goals OT Goal Formulation: With patient Time For Goal Achievement: 06/19/21 Potential to Achieve Goals: Fair ADL Goals Pt Will Perform Grooming: Independently;standing Pt Will Transfer to Toilet: with modified independence;ambulating Pt Will Perform Tub/Shower Transfer: with modified independence;ambulating Pt/caregiver will Perform Home Exercise Program: Increased strength;Both right and left upper extremity;With written HEP provided Additional ADL Goal #1: Pt will indep verbalize at least 3 fall prevention strategies to apply in the home setting  Plan Discharge plan remains appropriate    Co-evaluation                 AM-PAC OT "6 Clicks" Daily Activity     Outcome Measure   Help from another person eating meals?: None Help from another person taking care of personal grooming?: A Little Help from another person toileting, which includes using toliet, bedpan, or urinal?: A Little Help from another person bathing (including washing, rinsing, drying)?: A Little Help from another person to put on and taking off regular upper body clothing?: None Help from another person to put on and taking off regular lower body clothing?: A Little 6 Click Score: 20    End of Session Equipment Utilized During Treatment: Gait belt;Other (comment)  OT Visit Diagnosis: Other abnormalities of gait and mobility (R26.89);History of falling (Z91.81);Muscle weakness  (generalized) (M62.81)   Activity Tolerance Patient tolerated treatment well   Patient Left in bed;with call bell/phone within reach;with bed alarm set   Nurse Communication Mobility status        Time: [id number] OT Time Calculation (min): 12 min  Charges: OT General Charges $OT Visit: 1 Visit OT Treatments $Self Care/Home Management : 8-22 mins   Tamarius Rosenfield A Redell Bhandari 06/10/2021, 2:44 PM

## 2021-06-10 NOTE — CV Procedure (Signed)
° ° °  TRANSESOPHAGEAL ECHOCARDIOGRAM   NAME:  Ryan Montgomery    MRN: [id number] DOB:  09-23-1955    ADMIT DATE: 06/03/2021  INDICATIONS: Stroke  PROCEDURE:   Informed consent was obtained prior to the procedure. The risks, benefits and alternatives for the procedure were discussed and the patient comprehended these risks.  Risks include, but are not limited to, cough, sore throat, vomiting, nausea, somnolence, esophageal and stomach trauma or perforation, bleeding, low blood pressure, aspiration, pneumonia, infection, trauma to the teeth and death.    Procedural time out performed. The oropharynx was anesthetized with topical 1% benzocaine.    Anesthesia was administered by Dr. Doroteo Glassman.  The patient was administered 175 mg of propofol and 0 mg of lidocaine to achieve and maintain moderate conscious sedation.  The patient's heart rate, blood pressure, and oxygen saturation are monitored continuously during the procedure. The period of conscious sedation is 16 minutes, of which I was present face-to-face 100% of this time.   The transesophageal probe was inserted in the esophagus and stomach without difficulty and multiple views were obtained.   COMPLICATIONS:    There were no immediate complications.  KEY FINDINGS:  Small PFO noted on bubble study.  Normal LV/RV function.  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  Full report to follow. Further management per primary team.   Lake Bells T. Audie Box, MD, [place]  [address], [place], [place] [address] [phone]  8:28 AM

## 2021-06-10 NOTE — Transfer of Care (Signed)
Immediate Anesthesia Transfer of Care Note  Patient: Ryan Montgomery  Procedure(s) Performed: TRANSESOPHAGEAL ECHOCARDIOGRAM (TEE) BUBBLE STUDY  Patient Location: PACU  Anesthesia Type:MAC  Level of Consciousness: awake, oriented and patient cooperative  Airway & Oxygen Therapy: Patient Spontanous Breathing and Patient connected to face mask oxygen  Post-op Assessment: Report given to RN and Post -op Vital signs reviewed and stable  Post vital signs: Reviewed  Last Vitals:  Vitals Value Taken Time  BP    Temp    Pulse 81 06/10/21 0844  Resp 17 06/10/21 0844  SpO2 100 % 06/10/21 0844  Vitals shown include unvalidated device data.  Last Pain:  Vitals:   06/10/21 0740  TempSrc: Oral  PainSc: 0-No pain         Complications: No notable events documented.

## 2021-06-10 NOTE — Interval H&P Note (Signed)
History and Physical Interval Note:  06/10/2021 7:25 AM  Ryan Montgomery  has presented today for surgery, with the diagnosis of STROKE.  The various methods of treatment have been discussed with the patient and family. After consideration of risks, benefits and other options for treatment, the patient has consented to  Procedure(s): TRANSESOPHAGEAL ECHOCARDIOGRAM (TEE) (N/A) as a surgical intervention.  The patient's history has been reviewed, patient examined, no change in status, stable for surgery.  I have reviewed the patient's chart and labs.  Questions were answered to the patient's satisfaction.    NPO for TEE for stroke. Denies difficulty swallowing.   Lake Bells T. Audie Box, MD, [place]  [address], [place], [place] [address] [phone]  7:25 AM

## 2021-06-10 NOTE — Consult Note (Addendum)
ELECTROPHYSIOLOGY CONSULT NOTE  Patient ID: Ryan Montgomery MRN: [id number], DOB/AGE: 65-30-1957   Admit date: 06/03/2021 Date of Consult: 06/10/2021  Primary Physician: Laurey Morale, MD Primary Cardiologist: none Reason for Consultation: Cryptogenic stroke - recommendations regarding Implantable Loop Recorder, requested by Dr. Leonie Man  History of Present Illness Ryan Montgomery was admitted on 06/03/2021 with slurred speech, fall, confusion, found with stroke.    PMHx includes: smoker, Hep C, prior stroke  Neurology notes:  right pontine and left watershed scattered infarcts, embolic pattern secondary to unclear source.  he has undergone workup for stroke including echocardiogram and carotid dopplers.  The patient has been monitored on telemetry which has demonstrated sinus rhythm with no arrhythmias.  Inpatient stroke work-up is to be completed with a TEE.   Echocardiogram this admission demonstrated    1. Left ventricular ejection fraction, by estimation, is 55 to 60%. The  left ventricle has normal function. The left ventricle has no regional  wall motion abnormalities. There is mild concentric left ventricular  hypertrophy. Left ventricular diastolic  parameters were normal.   2. Right ventricular systolic function is normal. The right ventricular  size is normal. There is normal pulmonary artery systolic pressure.   3. Left atrial size was mildly dilated.   4. The mitral valve is normal in structure. Trivial mitral valve  regurgitation. No evidence of mitral stenosis.   5. The aortic valve is tricuspid. Aortic valve regurgitation is mild. No  aortic stenosis is present.   6. Aortic dilatation noted. There is moderate dilatation of the aortic  root, measuring 44 mm.   7. The inferior vena cava is normal in size with greater than 50%  respiratory variability, suggesting right atrial pressure of 3 mmHg.   Comparison(s): No significant change from prior study. Prior images   reviewed side by side.    Lab work is reviewed. WBC 12, no fever Deferred to IM   Prior to admission, the patient denies chest pain, shortness of breath, dizziness, palpitations, or syncope.  They are recovering from their stroke with plans to SNF at discharge.      Past Medical History:  Diagnosis Date   Chickenpox    Chickenpox    Heat stroke    Hepatitis C    Stroke [facility])      Surgical History:  Past Surgical History:  Procedure Laterality Date   colonoscopy  08-28-08   per Dr. Sharlett Iles, clear, repeat in 10 yrs    fx rt ankle     pins placed   JOINT REPLACEMENT Left 09/12/2019   TOTAL HIP ARTHROPLASTY Left 09/12/2019   per Dr. Magda Bernheim at [facility]      Medications Prior to Admission  Medication Sig Dispense Refill Last Dose   diclofenac (VOLTAREN) 75 MG EC tablet Take 1 tablet (75 mg total) by mouth 2 (two) times daily. 60 tablet 2 06/03/2021   tamsulosin (FLOMAX) 0.4 MG CAPS capsule Take 1 capsule (0.4 mg total) by mouth daily after supper. (Patient not taking: Reported on 05/14/2021) 30 capsule 0     Inpatient Medications:   amLODipine  5 mg Oral Daily   aspirin EC  81 mg Oral Daily   atorvastatin  20 mg Oral Daily   clopidogrel  75 mg Oral Daily   enoxaparin (LOVENOX) injection  40 mg Subcutaneous [id number]   folic acid  1 mg Oral Daily   nicotine  14 mg Transdermal Daily   tamsulosin  0.4 mg Oral Daily  Allergies:  Allergies  Allergen Reactions   Moxifloxacin Hives    Social History   Socioeconomic History   Marital status: Married    Spouse name: Not on file   Number of children: Not on file   Years of education: Not on file   Highest education level: Not on file  Occupational History   Not on file  Tobacco Use   Smoking status: Every Day    Packs/day: 0.50    Types: Cigarettes   Smokeless tobacco: Never  Vaping Use   Vaping Use: Never used  Substance and Sexual Activity   Alcohol use: Yes   Drug use: No   Sexual activity: Not  on file  Other Topics Concern   Not on file  Social History Narrative   Right handed   Lives with wife one story home   Social Determinants of Health   Financial Resource Strain: Not on file  Food Insecurity: Not on file  Transportation Needs: Not on file  Physical Activity: Not on file  Stress: Not on file  Social Connections: Not on file  Intimate Partner Violence: Not on file     Family History  Problem Relation Age of Onset   Hypertension Other    Lung cancer Other       Review of Systems: All other systems reviewed and are otherwise negative except as noted above.  Physical Exam: Vitals:   06/09/21 1543 06/09/21 1934 06/09/21 2310 06/10/21 0328  BP: 116/90 103/67 110/73 101/76  Pulse: 100 94 93 93  Resp: 16 18 20 18   Temp: (!) 97.3 F (36.3 C) 98.2 F (36.8 C) 98.1 F (36.7 C) (!) 97.3 F (36.3 C)  TempSrc: Oral Oral Oral Oral  SpO2: 99% 97% 96% 98%  Weight:      Height:        GEN- The patient is well appearing, alert and oriented x 3 today.   Head- normocephalic, atraumatic Eyes-  Sclera clear, conjunctiva pink Ears- hearing intact Oropharynx- clear Neck- supple Lungs- CTA b/l, normal work of breathing Heart- RRR, no murmurs, rubs or gallops  GI- soft, NT, ND Extremities- no clubbing, cyanosis, or edema MS- no significant deformity or atrophy Skin- no rash or lesion Psych- euthymic mood, full affect   Labs:   Lab Results  Component Value Date   WBC 12.4 (H) 06/10/2021   HGB 12.7 (L) 06/10/2021   HCT 38.3 (L) 06/10/2021   MCV 99.2 06/10/2021   PLT 140 (L) 06/10/2021    Recent Labs  Lab 06/03/21 1842 06/04/21 0418 06/10/21 0153  NA  --    < > 136  K  --    < > 4.1  CL  --    < > 105  CO2  --    < > 23  BUN  --    < > 14  CREATININE  --    < > 1.33*  CALCIUM  --    < > 8.9  PROT 8.3*  --   --   BILITOT 0.8  --   --   ALKPHOS 65  --   --   ALT 22  --   --   AST 28  --   --   GLUCOSE  --    < > 118*   < > = values in this  interval not displayed.   No results found for: CKTOTAL, CKMB, CKMBINDEX, TROPONINI Lab Results  Component Value Date   CHOL 117 06/05/2021  CHOL 167 11/27/2019   CHOL 120 07/18/2013   Lab Results  Component Value Date   HDL 33 (L) 06/05/2021   HDL 32 (L) 11/27/2019   HDL 47.70 07/18/2013   Lab Results  Component Value Date   LDLCALC 63 06/05/2021   LDLCALC 115 (H) 11/27/2019   LDLCALC 40 07/18/2013   Lab Results  Component Value Date   TRIG 104 06/05/2021   TRIG 101 11/27/2019   TRIG 163.0 (H) 07/18/2013   Lab Results  Component Value Date   CHOLHDL 3.5 06/05/2021   CHOLHDL 5.2 11/27/2019   CHOLHDL 3 07/18/2013   Lab Results  Component Value Date   LDLDIRECT 23.7 07/08/2011   LDLDIRECT 44.6 08/27/2010    No results found for: DDIMER   Radiology/Studies:  DG Chest 2 View Result Date: 06/03/2021 CLINICAL DATA:  Intermittent slurred speech with frequent falls and confusion EXAM: CHEST - 2 VIEW COMPARISON:  Chest x-ray 12/28/2020, CT 09/09/2020 FINDINGS: The heart size and mediastinal contours are within normal limits. Both lungs are clear. Severe chronic compression fracture at T12. IMPRESSION: No active cardiopulmonary disease. Electronically Signed   By: Donavan Foil M.D.   On: 06/03/2021 15:49   CT Head Wo Contrast Result Date: 06/03/2021 CLINICAL DATA:  Mental status change. EXAM: CT HEAD WITHOUT CONTRAST TECHNIQUE: Contiguous axial images were obtained from the base of the skull through the vertex without intravenous contrast. COMPARISON:  11/27/2018 FINDINGS: Brain: No acute intracranial hemorrhage. No focal mass lesion. No CT evidence of acute infarction. No midline shift or mass effect. No hydrocephalus. Basilar cisterns are patent. Remote small RIGHT frontal cortical and subcortical infarction. Extensive periventricular and subcortical white matter hypodensities. Generalized cortical atrophy. Vascular: No hyperdense vessel or unexpected calcification. Skull:  Normal. Negative for fracture or focal lesion. Sinuses/Orbits: Paranasal sinuses and mastoid air cells are clear. Orbits are clear. Other: None. IMPRESSION: 1. No acute intracranial findings. 2. 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 3. Remote RIGHT frontal infarction. Electronically Signed   By: Suzy Bouchard M.D.   On: 06/03/2021 16:34   MR ANGIO HEAD WO CONTRAST Result Date: 06/04/2021 CLINICAL DATA:  Initial evaluation for acute stroke. EXAM: MRA NECK WITHOUT AND WITH CONTRAST MRA HEAD WITHOUT CONTRAST TECHNIQUE: Multiplanar and multiecho pulse sequences of the neck were obtained without and with intravenous contrast. Angiographic images of the neck were obtained using MRA technique without and with intravenous contrast; Angiographic images of the Circle of Willis were obtained using MRA technique without intravenous contrast. CONTRAST:  9 cc of Gadavist. COMPARISON:  Previous MRI from earlier the same day. FINDINGS: MRA NECK FINDINGS AORTIC ARCH: Partially visualized aortic arch normal in caliber with normal branch pattern. No stenosis seen about the origin of the great vessels. RIGHT CAROTID SYSTEM: Right common and internal carotid arteries patent without stenosis or dissection. No significant atheromatous narrowing about the right carotid bulb. LEFT CAROTID SYSTEM: Left common and internal carotid arteries patent without stenosis or dissection. No significant atheromatous narrowing about the left carotid wall. VERTEBRAL ARTERIES: Both vertebral arteries arise from the subclavian arteries. Right vertebral artery slightly dominant. No visible proximal subclavian artery stenosis. Vertebral arteries patent without stenosis or evidence for dissection. MRA HEAD FINDINGS ANTERIOR CIRCULATION: Both internal carotid arteries widely patent to the termini without stenosis or other abnormality. A1 segments widely patent. Normal anterior communicating artery complex. Anterior cerebral  arteries widely patent without stenosis. No M1 stenosis or occlusion. Normal MCA bifurcations. Distal MCA branches well perfused and symmetric.  POSTERIOR CIRCULATION: Visualized V4 segments widely patent bilaterally. Right vertebral artery dominant. Partially visualized PICA patent bilaterally. Basilar patent to its distal aspect without stenosis. Superior cerebellar are and posterior cerebral arteries widely patent bilaterally. Small bilateral posterior communicating arteries noted. Anatomic variants: None significant.  No aneurysm. IMPRESSION: Negative MRA of the head and neck. No large vessel occlusion. No hemodynamically significant or correctable stenosis. Electronically Signed   By: Jeannine Boga M.D.   On: 06/04/2021 00:30    MR Brain Wo Contrast (neuro protocol) Result Date: 06/03/2021 CLINICAL DATA:  Initial evaluation for neuro deficit, stroke suspected./ EXAM: MRI HEAD WITHOUT CONTRAST TECHNIQUE: Multiplanar, multiecho pulse sequences of the brain and surrounding structures were obtained without intravenous contrast. COMPARISON:  Head CT from earlier the same day. FINDINGS: Brain: Cerebral volume within normal limits for age. Patchy and confluent T2/FLAIR hyperintensity involving the periventricular deep white matter both cerebral hemispheres most consistent with chronic small vessel ischemic disease, moderate to advanced in nature. Multiple scattered remote lacunar infarcts present about the bilateral basal ganglia/hemispheric cerebral white matter. Chronic right MCA territory infarct noted at the high right frontal lobe. Additional chronic left cerebellar infarcts noted. Additional small areas of encephalomalacia and gliosis involving the anterior frontal poles favored to be related to remote trauma. 12 mm acute ischemic infarcts seen involving the right pons (series 5, image 62). Additional scattered acute ischemic infarcts seen involving the cortical and subcortical aspect of the left  frontal, parietal, and occipital regions, consistent with acute ischemic infarcts as well (series 5, image 84). These are watershed in distribution. No associated hemorrhage or significant mass effect about these areas of infarction. Otherwise, gray-white matter differentiation maintained. No areas of acute or chronic intracranial hemorrhage. No mass lesion, midline shift or mass effect. No hydrocephalus or extra-axial fluid collection. Pituitary gland suprasellar region normal. Midline structures intact. Vascular: Major intracranial vascular flow voids are maintained. Skull and upper cervical spine: Craniocervical junction within normal limits. Bone marrow signal intensity normal. No scalp soft tissue abnormality. Sinuses/Orbits: Globes and orbital soft tissues demonstrate no acute finding. Mild scattered mucosal thickening noted within the ethmoidal air cells. Paranasal sinuses are otherwise clear. Trace right mastoid effusion noted, of doubtful significance. Other: None. IMPRESSION: 1. 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 2. Additional scattered acute ischemic nonhemorrhagic infarcts involving the cortical and subcortical aspect of the left frontal, parietal, and occipital lobes. These are watershed in distribution. 3. Underlying moderate to advanced chronic microvascular ischemic disease with multiple remote infarcts involving the right frontal lobe, bilateral basal ganglia/hemispheric cerebral white matter, left cerebellum. 4. Additional small areas of encephalomalacia involving the anterior frontal lobes bilaterally, likely related to remote trauma. Electronically Signed   By: Jeannine Boga M.D.   On: 06/03/2021 20:09   VAS [place] TRANSCRANIAL DOPPLER W BUBBLES Result Date: 06/08/2021  Transcranial Doppler with Bubble Patient Name:  FENN BAYS  Date of Exam:   06/08/2021 Medical Rec #: [id number]        Accession #:    [id number] Date of Birth: December 06, 1955         Patient Gender: M  Patient Age:   63 years Exam Location:  [facility] Procedure:      VAS [place] TRANSCRANIAL DOPPLER W BUBBLES Referring Phys: Janine Ores --------------------------------------------------------------------------------  Indications: MRI brain showing 12 mm acute ischemic nonhemorrhagic right pontine infarct. Additional scattered acute ischemic nonhemorrhagic infarcts involving the cortical and subcortical aspect of the left frontal, parietal, and occipital lobes. These are watershed in  distribution. History: Slurred speech, falls, and confusion x1 week. Limitations for diagnostic windows: Unable to insonate right transtemporal window. Unable to insonate left transtemporal window. Comparison Study: No prior study Performing Technologist: Sharion Dove RVS  Examination Guidelines: A complete evaluation includes B-mode imaging, spectral Doppler, color Doppler, and power Doppler as needed of all accessible portions of each vessel. Bilateral testing is considered an integral part of a complete examination. Limited examinations for reoccurring indications may be performed as noted.  Summary:  A vascular evaluation was performed. The right Opthalmic was studied. An IV was inserted into the patient's right forearm. Verbal informed consent was obtained.  Minimal hits noted with Valsalva. No significant findings. *See table(s) above for TCD measurements and observations.    Preliminary       VAS [place] LOWER EXTREMITY VENOUS (DVT) Result Date: 06/06/2021  Lower Venous DVT Study Patient Name:  TYWAN WAWRO  Date of Exam:   06/06/2021 Medical Rec #: [id number]        Accession #:    [id number] Date of Birth: 28-Apr-1956         Patient Gender: M Patient Age:   47 years Exam Location:  [facility] Procedure:      VAS [place] LOWER EXTREMITY VENOUS (DVT) Referring Phys: Cornelius Moras XU --------------------------------------------------------------------------------  Indications: Stroke.  Comparison Study: No prior study  Performing Technologist: Sharion Dove RVS  Examination Guidelines: A complete evaluation includes B-mode imaging, spectral Doppler, color Doppler, and power Doppler as needed of all accessible portions of each vessel. Bilateral testing is considered an integral part of a complete examination. Limited examinations for reoccurring indications may be performed as noted. The reflux portion of the exam is performed with the patient in reverse Trendelenburg.  Summary: BILATERAL: - No evidence of deep vein thrombosis seen in the lower extremities, bilaterally. -No evidence of popliteal cyst, bilaterally.   *See table(s) above for measurements and observations. Electronically signed by Orlie Pollen on 06/06/2021 at 3:56:46 PM.    Final     12-lead ECG SR All prior EKG's in EPIC reviewed with no documented atrial fibrillation  Telemetry SR, ST, one fleeting PAT  Assessment and Plan:  1. Cryptogenic stroke The patient presents with cryptogenic stroke.  The patient has a TEE planned for this morning.  I spoke at length with the patient about monitoring for afib with either a 30 day event monitor or an implantable loop recorder.  Risks, benefits, and alteratives to implantable loop recorder were discussed with the patient today.   At this time, the patient is very clear in their decision to proceed with implantable loop recorder.   Wound care was reviewed with the patient (keep incision clean and dry for 3 days).  Wound check follow up Reo Portela be scheduled for the patient.  Please call with questions.   Baldwin Jamaica, PA-C 06/10/2021  I have seen and examined this patient with Tommye Standard.  Agree with above, note added to reflect my findings.  On exam, RRR, no murmurs, lungs clear, no edema, no JVD.  Patient presented to the hospital with cryptogenic stroke. To date, no cause has been found. TEE planned for today. If unrevealing, Floy Angert plan for LINQ monitor to look for atrial fibrillation. Risks and  benefits discussed. Risks include but not limited to bleeding and infection. The patient understands the risks and has agreed to the procedure.  Asaiah Hunnicutt M. Payten Beaumier MD 06/10/2021 7:33 AM

## 2021-06-10 NOTE — Anesthesia Procedure Notes (Signed)
Procedure Name: MAC Date/Time: 06/10/2021 8:05 AM Performed by: Jenne Campus, CRNA Pre-anesthesia Checklist: Patient identified, Emergency Drugs available, Suction available, Patient being monitored and Timeout performed Oxygen Delivery Method: Nasal cannula

## 2021-06-10 NOTE — Progress Notes (Signed)
OT Cancellation Note  Patient Details Name: Ryan Montgomery MRN: [id number] DOB: [birth year]   Cancelled Treatment:    Reason Eval/Treat Not Completed: Patient at procedure or test/ unavailable (Pt having TEE procedure, OT treatment to attempt at a later time.)  [name] [name] 06/10/2021, 7:39 AM

## 2021-06-11 ENCOUNTER — Encounter (HOSPITAL_COMMUNITY): Payer: Self-pay | Admitting: Cardiology

## 2021-06-11 NOTE — TOC Progression Note (Addendum)
Transition of Care [facility]) - Progression Note    Patient Details  Name: Ryan Montgomery MRN: [id number] Date of Birth: [birth year]  [facility] CM/SW Contact  Kermit Balo, RN Phone Number: 06/11/2021, 11:43 AM  Clinical Narrative:    CM has left voicemails for: [facility] ([facility]), [facility] [place], [address], [facility] in [place], [place] [facility] [place] and [facility] [place]  CM was able to reach [facility] and re-faxed the referral. They will review and update CM on whether they can offer a bed.  TOC following.  1531: [facility] and [facility] accepted[facility]. CM has updated the facility and daughter.   Expected Discharge Plan: Skilled Nursing Facility Barriers to Discharge: Continued Medical Work up, Unsafe home situation  Expected Discharge Plan and Services Expected Discharge Plan: Skilled Nursing Facility In-house Referral: Clinical Social Work   Post Acute Care Choice: Skilled Nursing Facility Living arrangements for the past 2 months: Apartment                                       Social Determinants of Health (SDOH) Interventions    Readmission Risk Interventions Readmission Risk Prevention Plan 11/29/2019  Post Dischage Appt Complete  Medication Screening Complete  Transportation Screening Complete  Some recent data might be hidden

## 2021-06-11 NOTE — Progress Notes (Signed)
Physical Therapy Treatment Patient Details Name: Ryan Montgomery MRN: [id number] DOB: [birth year] Today's Date: 06/11/2021   History of Present Illness Ryan Montgomery is a 66 y.o. male presenting with syncopal episode with jerking motions, urinary incontinence and LOC. THC in UDS. PMH is significant for treated hepatitis C and gout, h/o stroke, h/o alcohol abuse, L hip sx in 08/2019.    PT Comments    Patient was able to increase his walking distance today suggesting improvements with endurance. He also exhibited improved LE strength with functional tasks but still was unsteady with movements and relied on his UEs. When he was walking and ran into a cart on his right side he made an excuse as to why he ran into the cart. It was unclear as to whether it was due to inattention due to impaired cognition or vision. When asked to multitask during walking with head turns or reading his gait speed slowed down significantly or stopped. During treatment session patient showed deficits in strength, endurance, activity tolerance, and cognition. Recommending therapy services at a SNF to address the previously stated deficits. Will continue to follow acutely to maximize functional mobility, independence, and safety.    Recommendations for follow up therapy are one component of a multi-disciplinary discharge planning process, led by the attending physician.  Recommendations may be updated based on patient status, additional functional criteria and insurance authorization.  Follow Up Recommendations  Skilled nursing-short term rehab (<3 hours/day)     Assistance Recommended at Discharge Frequent or constant Supervision/Assistance  Patient can return home with the following A little help with walking and/or transfers;Assistance with cooking/housework;Direct supervision/assist for medications management;Direct supervision/assist for financial management;Assist for transportation;Help with stairs or ramp for  entrance;A lot of help with bathing/dressing/bathroom   Equipment Recommendations  Rolling walker (2 wheels)    Recommendations for Other Services       Precautions / Restrictions Precautions Precautions: Fall Precaution Comments: Mildly impaired cognition Restrictions Weight Bearing Restrictions: No     Mobility  Bed Mobility Overal bed mobility: Modified Independent                  Transfers Overall transfer level: Needs assistance Equipment used: Straight cane Transfers: Sit to/from Stand Sit to Stand: Supervision                Ambulation/Gait Ambulation/Gait assistance: Min guard, Min assist, Supervision Gait Distance (Feet): 350 Feet (patient also walked an additional 230 ft to and from the physical therapy gym.) Assistive device: Straight cane Gait Pattern/deviations: Step-through pattern, Decreased stride length Gait velocity: Decreased     General Gait Details: during ambulation with the cane patient ran into a cart on the right side on 2 different instances, one where he dropped his cane and one where he did not, and was visibly frustrated.   Stairs Stairs: Yes Stairs assistance: Min assist Stair Management: One rail Left, Forwards, With cane Number of Stairs: 2 (2 up and 2 down, 2x total.)     Wheelchair Mobility    Modified Rankin (Stroke Patients Only) Modified Rankin (Stroke Patients Only) Pre-Morbid Rankin Score: Slight disability Modified Rankin: Moderately severe disability     Balance Overall balance assessment: Needs assistance Sitting-balance support: Feet supported Sitting balance-Leahy Scale: Good     Standing balance support: During functional activity, Single extremity supported Standing balance-Leahy Scale: Fair  Cognition Arousal/Alertness: Awake/alert Behavior During Therapy: WFL for tasks assessed/performed, Flat affect Overall Cognitive Status: No family/caregiver  present to determine baseline cognitive functioning                                 General Comments: Patient was able to respond questions quickly and acuratly but when he was explaining something or telling a story he seemed to loose track of what he was saying. Patient smelled of urine leading to concern for self management.        Exercises      General Comments        Pertinent Vitals/Pain Pain Assessment Pain Assessment: Faces Faces Pain Scale: Hurts a little bit Pain Location: Notes discomfort where they placed the loop recorder on his chest. Pain Descriptors / Indicators: Discomfort    Home Living                          Prior Function            PT Goals (current goals can now be found in the care plan section)      Frequency    Min 4X/week      PT Plan Current plan remains appropriate    Co-evaluation              AM-PAC PT "6 Clicks" Mobility   Outcome Measure  Help needed turning from your back to your side while in a flat bed without using bedrails?: A Little Help needed moving from lying on your back to sitting on the side of a flat bed without using bedrails?: A Little Help needed moving to and from a bed to a chair (including a wheelchair)?: A Little Help needed standing up from a chair using your arms (e.g., wheelchair or bedside chair)?: A Lot Help needed to walk in hospital room?: A Lot Help needed climbing 3-5 steps with a railing? : A Lot 6 Click Score: 15    End of Session Equipment Utilized During Treatment: Gait belt Activity Tolerance: Patient tolerated treatment well Patient left: in chair;with call bell/phone within reach;with chair alarm set Nurse Communication: Mobility status;Other (comment) (cognitive status during session) PT Visit Diagnosis: Unsteadiness on feet (R26.81);History of falling (Z91.81);Difficulty in walking, not elsewhere classified (R26.2);Other symptoms and signs involving the  nervous system (R29.898)     Time: 6948-5462 PT Time Calculation (min) (ACUTE ONLY): 26 min  Charges:  $Gait Training: 8-22 mins $Therapeutic Activity: 8-22 mins                     Armanda Heritage, SPT    Armanda Heritage 06/11/2021, 1:04 PM

## 2021-06-11 NOTE — Progress Notes (Signed)
PROGRESS NOTE    Ryan Montgomery  [id number] DOB: [birth year] DOA: 06/03/2021 PCP: Laurey Morale, MD   Chief Complain: Patient is a 66 year old male with history of tobacco abuse, prior stroke with no residual deficits, hepatitis C who presented to the emergency department with complaints of 1 week history of slurred speech, falls.  MRI on presentation showed acute to subacute nonhemorrhagic right pontine infarct, scattered nonhemorrhagic infarcts involving the cortical and subcortical aspect of the left frontoparietal and occipital lobe concerning for watershed distribution.  MRA of the head and neck did not show any significant stenosis.  Suspicion for cardioembolic etiology.  Neurology were following. Underwent TEE/loop recorder placement.  Stroke work-up completed.  PT/OT evaluation done,recommended SNF.  Medically stable for discharge as soon as bed is available.  Brief Narrative:   Assessment & Plan:   Principal Problem:   Acute CVA (cerebrovascular accident) ([place]) Active Problems:   AKI (acute kidney injury) ([place])   Tobacco use   Aortic dilatation ([place])   Dysphagia   Acute ischemic stroke: Presented with 1 week history of slurred speech, falls, weakness.MRI on presentation showed acute to subacute nonhemorrhagic right pontine infarct, scattered nonhemorrhagic infarcts involving the cortical and subcortical aspect of the left frontoparietal and occipital lobe concerning for watershed distribution.  MRA of the head and neck did not show any significant stenosis.  Suspicion for cardioembolic etiology.  Neurology following. Underwent  TEE/loop recorder placement,done on 1/16.  TEE showed a small PFO not sure how significant that is PT/OT/speech done, recommended skilled nursing facility on discharge. LDL of 63.  Hemoglobin A1c of 4.7.  Neurology recommending to continue aspirin and Plavix for 3 weeks followed by aspirin alone.  Started on Lipitor 20 mg daily. Venous Doppler  ruled out  DVT Echo showed EF of 55 to 60%, no regional wall motion, mild dilated left atrial size no intra-atrial shunt.  Transcranial Doppler with bubble study did not show any significant finding.  AKI: Much better now.  Creatinine bumped up slightly again.  Check BMP tomorrow  Leukocytosis: Mild.  Most likely reactive.  Check CBC tomorrow  Aortic dilation: Seen on echocardiogram.  Recommend outpatient follow-up with cardiology.  Tobacco use: Continue nicotine patch.  Counseled for cessation  History of hepatitis C :status post treatment  Hypertension: Blood pressure stable now . On amlodipine 5 mg daily  Debility/deconditioning: PT/OT recommended skilled NF  on discharge.        DVT prophylaxis:Lovenox Code Status: Full Family Communication: None at the bedside.  Patient says no need to call anybody Patient status:Inpatient  Dispo: The patient is from: Home              Anticipated d/c is to:  SNF               Anticipated d/c date is: As soon as bed is available at the skilled nursing facility  Procedures: None  Antimicrobials:  Anti-infectives (From admission, onward)    None       Subjective: Patient seen and examined at the bedside this morning.  Hemodynamically stable and comfortable.  Denies any complaints.  Objective: Vitals:   06/10/21 1525 06/10/21 2003 06/10/21 2337 06/11/21 0356  BP: 113/77 109/64 96/67 122/73  Pulse: 85 97 95 83  Resp: 18 17 18 17   Temp: 98 F (36.7 C) 98.1 F (36.7 C) 97.9 F (36.6 C) 98 F (36.7 C)  TempSrc:  Oral Oral Oral  SpO2: 99% 92% 96% 98%  Weight:  Height:        Intake/Output Summary (Last 24 hours) at 06/11/2021 0743 Last data filed at 06/10/2021 2340 Gross per 24 hour  Intake 300 ml  Output 300 ml  Net 0 ml   Filed Weights   06/03/21 1504 06/04/21 2200 06/10/21 0740  Weight: 81.6 kg 81.8 kg 81.8 kg    Examination:  General exam: Overall comfortable, not in distress HEENT: PERRL Respiratory system:  no  wheezes or crackles  Cardiovascular system: S1 & S2 heard, RRR.  Gastrointestinal system: Abdomen is nondistended, soft and nontender. Central nervous system: Alert and oriented Extremities: No edema, no clubbing ,no cyanosis Skin: No rashes, no ulcers,no icterus       Data Reviewed: I have personally reviewed following labs and imaging studies  CBC: Recent Labs  Lab 06/10/21 0153  WBC 12.4*  NEUTROABS 6.4  HGB 12.7*  HCT 38.3*  MCV 99.2  PLT [id number]*   Basic Metabolic Panel: Recent Labs  Lab 06/05/21 0603 06/10/21 0153  NA 144 136  K 3.5 4.1  CL 112* 105  CO2 23 23  GLUCOSE 85 118*  BUN 10 14  CREATININE 1.11 1.33*  CALCIUM 8.5* 8.9   GFR: Estimated Creatinine Clearance: 59.8 mL/min (A) (by C-G formula based on SCr of 1.33 mg/dL (H)). Liver Function Tests: No results for input(s): AST, ALT, ALKPHOS, BILITOT, PROT, ALBUMIN in the last 168 hours.  No results for input(s): LIPASE, AMYLASE in the last 168 hours. No results for input(s): AMMONIA in the last 168 hours. Coagulation Profile: No results for input(s): INR, PROTIME in the last 168 hours. Cardiac Enzymes: No results for input(s): CKTOTAL, CKMB, CKMBINDEX, TROPONINI in the last 168 hours. BNP (last 3 results) No results for input(s): PROBNP in the last 8760 hours. HbA1C: No results for input(s): HGBA1C in the last 72 hours.  CBG: No results for input(s): GLUCAP in the last 168 hours. Lipid Profile: No results for input(s): CHOL, HDL, LDLCALC, TRIG, CHOLHDL, LDLDIRECT in the last 72 hours.  Thyroid Function Tests: No results for input(s): TSH, T4TOTAL, FREET4, T3FREE, THYROIDAB in the last 72 hours.  Anemia Panel: No results for input(s): VITAMINB12, FOLATE, FERRITIN, TIBC, IRON, RETICCTPCT in the last 72 hours.  Sepsis Labs: No results for input(s): PROCALCITON, LATICACIDVEN in the last 168 hours.  Recent Results (from the past 240 hour(s))  Resp Panel by RT-PCR (Flu A&B, Covid) Nasopharyngeal Swab      Status: None   Collection Time: 06/03/21  6:42 PM   Specimen: Nasopharyngeal Swab; Nasopharyngeal(NP) swabs in vial transport medium  Result Value Ref Range Status   SARS Coronavirus 2 by RT PCR NEGATIVE NEGATIVE Final    Comment: (NOTE) SARS-CoV-2 target nucleic acids are NOT DETECTED.  The SARS-CoV-2 RNA is generally detectable in upper respiratory specimens during the acute phase of infection. The lowest concentration of SARS-CoV-2 viral copies this assay can detect is 138 copies/mL. A negative result does not preclude SARS-Cov-2 infection and should not be used as the sole basis for treatment or other patient management decisions. A negative result may occur with  improper specimen collection/handling, submission of specimen other than nasopharyngeal swab, presence of viral mutation(s) within the areas targeted by this assay, and inadequate number of viral copies(<138 copies/mL). A negative result must be combined with clinical observations, patient history, and epidemiological information. The expected result is Negative.  Fact Sheet for Patients:  EntrepreneurPulse.com.au  Fact Sheet for Healthcare Providers:  IncredibleEmployment.be  This test is no t yet approved  or cleared by the [place] and  has been authorized for detection and/or diagnosis of SARS-CoV-2 by FDA under an Emergency Use Authorization (EUA). This EUA will remain  in effect (meaning this test can be used) for the duration of the COVID-19 declaration under Section 564(b)(1) of the Act, 21 U.S.C.section 360bbb-3(b)(1), unless the authorization is terminated  or revoked sooner.       Influenza A by PCR NEGATIVE NEGATIVE Final   Influenza B by PCR NEGATIVE NEGATIVE Final    Comment: (NOTE) The Xpert Xpress SARS-CoV-2/FLU/RSV plus assay is intended as an aid in the diagnosis of influenza from Nasopharyngeal swab specimens and should not be used as a sole basis  for treatment. Nasal washings and aspirates are unacceptable for Xpert Xpress SARS-CoV-2/FLU/RSV testing.  Fact Sheet for Patients: EntrepreneurPulse.com.au  Fact Sheet for Healthcare Providers: IncredibleEmployment.be  This test is not yet approved or cleared by the [place] FDA and has been authorized for detection and/or diagnosis of SARS-CoV-2 by FDA under an Emergency Use Authorization (EUA). This EUA will remain in effect (meaning this test can be used) for the duration of the COVID-19 declaration under Section 564(b)(1) of the Act, 21 U.S.C. section 360bbb-3(b)(1), unless the authorization is terminated or revoked.  Performed at [facility], [place] [address], [place], [place] [address]          Radiology Studies: EP PPM/ICD IMPLANT  Result Date: 06/10/2021 SURGEON:  Allegra Lai, MD   PREPROCEDURE DIAGNOSIS:  Cryptogenic Stroke   POSTPROCEDURE DIAGNOSIS:  Cryptogenic Stroke    PROCEDURES:  1. Implantable loop recorder implantation   INTRODUCTION:  Marqavious Brouillet is a 66 y.o. male with a history of unexplained stroke who presents today for implantable loop implantation.  The patient has had a cryptogenic stroke.  Despite an extensive workup by neurology, no reversible causes have been identified.  he has worn telemetry during which he did not have arrhythmias.  There is significant concern for possible atrial fibrillation as the cause for the patients stroke.  The patient therefore presents today for implantable loop implantation.   DESCRIPTION OF PROCEDURE:  Informed written consent was obtained, and the patient was brought to the electrophysiology lab in a fasting state.  The patient required no sedation for the procedure today.  Mapping over the patient's chest was performed by the EP lab staff to identify the area where electrograms were most prominent for ILR recording.  This area was found to be the left parasternal  region over the 3rd-4th intercostal space. The patients left chest was therefore prepped and draped in the usual sterile fashion by the EP lab staff. The skin overlying the left parasternal region was infiltrated with lidocaine for local analgesia.  A 0.5-cm incision was made over the left parasternal region over the 3rd intercostal space.  A subcutaneous ILR pocket was fashioned using a combination of sharp and blunt dissection.  A Medtronic Reveal Linq model LINQ SN [id number] implantable loop recorder was then placed into the pocket  R waves were very prominent and measured 0.58mV. EBL<1 ml.  Steri- Strips and a sterile dressing were then applied.  There were no early apparent complications.   CONCLUSIONS:  1. Successful implantation of a Medtronic Reveal LINQ implantable loop recorder for cryptogenic stroke  2. No early apparent complications.   ECHO TEE  Result Date: 06/10/2021    TRANSESOPHOGEAL ECHO REPORT   Patient Name:   BRYDEN HOOFNAGLE Date of Exam: 06/10/2021 Medical Rec #:  [id number]  Height:       71.0 in Accession #:    [id number]      Weight:       180.3 lb Date of Birth:  06-Jan-1956        BSA:          2.018 m Patient Age:    60 years        BP:           112/77 mmHg Patient Gender: M               HR:           80 bpm. Exam Location:  Inpatient Procedure: Transesophageal Echo, Cardiac Doppler, Color Doppler and Saline            Contrast Bubble Study Indications:     Stroke  History:         Patient has prior history of Echocardiogram examinations, most                  recent 06/04/2021. Risk Factors:Current Smoker.  Sonographer:     Clayton Lefort RDCS (AE) Referring Phys:  [id number] RHONDA G BARRETT Diagnosing Phys: Eleonore Chiquito MD PROCEDURE: After discussion of the risks and benefits of a TEE, an informed consent was obtained from the patient. TEE procedure time was 16 minutes. The transesophogeal probe was passed without difficulty through the esophogus of the patient. Imaged were obtained with  the patient in a left lateral decubitus position. Local oropharyngeal anesthetic was provided with Cetacaine. Sedation performed by different physician. The patient was monitored while under deep sedation. Anesthestetic sedation was provided intravenously by Anesthesiology: 175mg  of Propofol. Image quality was excellent. The patient's vital signs; including heart rate, blood pressure, and oxygen saturation; remained stable throughout the procedure. The patient developed no complications during the procedure. IMPRESSIONS  1. Aortic dilatation noted. Aneurysm of the aortic root, measuring 45 mm. There is Moderate (Grade III) layered plaque involving the descending aorta and aortic arch.  2. Equivocal bubble study with trivial bubbles crossing the IAS. Findings possibly represent a trivial PFO. Would defer to transcranial dopplers for interatrial shunt. Agitated saline contrast bubble study was positive with shunting observed within 3-6 cardiac cycles suggestive of interatrial shunt. There is a small patent foramen ovale with predominantly right to left shunting across the atrial septum.  3. No left atrial/left atrial appendage thrombus was detected. The LAA emptying velocity was 75 cm/s.  4. Left ventricular ejection fraction, by estimation, is 60 to 65%. The left ventricle has normal function.  5. Right ventricular systolic function is normal. The right ventricular size is normal.  6. The mitral valve is grossly normal. Trivial mitral valve regurgitation. No evidence of mitral stenosis.  7. The aortic valve is tricuspid. Aortic valve regurgitation is mild. No aortic stenosis is present. FINDINGS  Left Ventricle: Left ventricular ejection fraction, by estimation, is 60 to 65%. The left ventricle has normal function. The left ventricular internal cavity size was normal in size. Right Ventricle: The right ventricular size is normal. No increase in right ventricular wall thickness. Right ventricular systolic function is  normal. Left Atrium: Left atrial size was normal in size. No left atrial/left atrial appendage thrombus was detected. The LAA emptying velocity was 75 cm/s. Right Atrium: Right atrial size was normal in size. Pericardium: Trivial pericardial effusion is present. Mitral Valve: The mitral valve is grossly normal. Trivial mitral valve regurgitation. No evidence of mitral valve stenosis. Tricuspid Valve: The tricuspid valve  is grossly normal. Tricuspid valve regurgitation is trivial. No evidence of tricuspid stenosis. Aortic Valve: The aortic valve is tricuspid. Aortic valve regurgitation is mild. No aortic stenosis is present. Pulmonic Valve: The pulmonic valve was grossly normal. Pulmonic valve regurgitation is trivial. No evidence of pulmonic stenosis. Aorta: Aortic dilatation noted. 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 There is moderate (Grade III) layered plaque involving the descending aorta and aortic arch. Venous: The left lower pulmonary vein, left upper pulmonary vein, right upper pulmonary vein and right lower pulmonary vein are normal. IAS/Shunts: There is redundancy of the interatrial septum. The atrial septum is grossly normal. Agitated saline contrast was given intravenously to evaluate for intracardiac shunting. Agitated saline contrast bubble study was positive with shunting observed within 3-6 cardiac cycles suggestive of interatrial shunt. A small patent foramen ovale is detected with predominantly right to left shunting across the atrial septum. Equivocal bubble study with trivial bubbles crossing the IAS. Findings possibly represent a trivial PFO. Would defer to transcranial dopplers for interatrial shunt.   AORTA Ao Root diam: 4.50 cm Ao Asc diam:  3.60 cm Eleonore Chiquito MD Electronically signed by Eleonore Chiquito MD Signature Date/Time: 06/10/2021/10:53:02 AM    Final         Scheduled Meds:  amLODipine  5 mg Oral Daily   aspirin EC  81 mg Oral Daily   atorvastatin   20 mg Oral Daily   clopidogrel  75 mg Oral Daily   enoxaparin (LOVENOX) injection  40 mg Subcutaneous [id number]   folic acid  1 mg Oral Daily   nicotine  14 mg Transdermal Daily   tamsulosin  0.4 mg Oral Daily   Continuous Infusions:      LOS: 8 days        Shelly Coss, MD [facility] P1/17/2023, 7:43 AM

## 2021-06-12 LAB — BASIC METABOLIC PANEL
Anion gap: 9 (ref 5–15)
BUN: 13 mg/dL (ref 8–23)
CO2: 24 mmol/L (ref 22–32)
Calcium: 9 mg/dL (ref 8.9–10.3)
Chloride: 104 mmol/L (ref 98–111)
Creatinine, Ser: 1.17 mg/dL (ref 0.61–1.24)
GFR, Estimated: >60 mL/min (ref >60)
Glucose, Bld: 149 mg/dL — ABNORMAL HIGH (ref 70–99)
Potassium: 3.7 mmol/L (ref 3.5–5.1)
Sodium: 137 mmol/L (ref 135–145)

## 2021-06-12 LAB — CBC WITH DIFFERENTIAL/PLATELET
Abs Immature Granulocytes: 0.01 10*3/uL (ref 0.00–0.07)
Basophils Absolute: 0 10*3/uL (ref 0.0–0.1)
Basophils Relative: 1 %
Eosinophils Absolute: 0.1 10*3/uL (ref 0.0–0.5)
Eosinophils Relative: 2 %
HCT: 37.4 % — ABNORMAL LOW (ref 39.0–52.0)
Hemoglobin: 12.2 g/dL — ABNORMAL LOW (ref 13.0–17.0)
Immature Granulocytes: 0 %
Lymphocytes Relative: 59 %
Lymphs Abs: 4.7 10*3/uL — ABNORMAL HIGH (ref 0.7–4.0)
MCH: 32.4 pg (ref 26.0–34.0)
MCHC: 32.6 g/dL (ref 30.0–36.0)
MCV: 99.2 fL (ref 80.0–100.0)
Monocytes Absolute: 0.7 10*3/uL (ref 0.1–1.0)
Monocytes Relative: 8 %
Neutro Abs: 2.4 10*3/uL (ref 1.7–7.7)
Neutrophils Relative %: 30 %
Platelets: 139 10*3/uL — ABNORMAL LOW (ref 150–400)
RBC: 3.77 MIL/uL — ABNORMAL LOW (ref 4.22–5.81)
RDW: 13.2 % (ref 11.5–15.5)
WBC: 7.9 10*3/uL (ref 4.0–10.5)
nRBC: 0 % (ref 0.0–0.2)

## 2021-06-12 NOTE — Plan of Care (Signed)
°  Problem: Education: Goal: Knowledge of disease or condition will improve Outcome: Progressing Goal: Knowledge of patient specific risk factors will improve (INDIVIDUALIZE FOR PATIENT) Outcome: Progressing   Problem: Self-Care: Goal: Ability to participate in self-care as condition permits will improve Outcome: Progressing Goal: Ability to communicate needs accurately will improve Outcome: Progressing   Problem: Nutrition: Goal: Risk of aspiration will decrease Outcome: Progressing   Problem: Ischemic Stroke/TIA Tissue Perfusion: Goal: Complications of ischemic stroke/TIA will be minimized Outcome: Progressing

## 2021-06-12 NOTE — Plan of Care (Signed)
°  Problem: Self-Care: Goal: Ability to participate in self-care as condition permits will improve Outcome: Progressing Goal: Ability to communicate needs accurately will improve Outcome: Progressing   

## 2021-06-12 NOTE — Progress Notes (Signed)
PROGRESS NOTE  Ryan Montgomery  [id number] DOB: 22-Feb-1956 DOA: 06/03/2021 PCP: Laurey Morale, MD   Brief Narrative: Patient is a 66 year old male with history of tobacco abuse, prior stroke with no residual deficits, hepatitis C who presented to the emergency department with complaints of 1 week history of slurred speech, falls.  MRI on presentation showed acute to subacute nonhemorrhagic right pontine infarct, scattered nonhemorrhagic infarcts involving the cortical and subcortical aspect of the left frontoparietal and occipital lobe concerning for watershed distribution.  MRA of the head and neck did not show any significant stenosis.  Suspicion for cardioembolic etiology.  Neurology were following. Underwent TEE/loop recorder placement.  Stroke work-up completed.  PT/OT evaluation done,recommended SNF.  Medically stable for discharge as soon as bed is available.   Assessment & Plan: Principal Problem:   Acute CVA (cerebrovascular accident) ([place]) Active Problems:   AKI (acute kidney injury) ([place])   Tobacco use   Aortic dilatation ([place])   Dysphagia  Acute ischemic stroke: Presented with 1 week history of slurred speech, falls, weakness.MRI on presentation showed acute to subacute nonhemorrhagic right pontine infarct, scattered nonhemorrhagic infarcts involving the cortical and subcortical aspect of the left frontoparietal and occipital lobe concerning for watershed distribution.  MRA of the head and neck did not show any significant stenosis.  Suspicion for cardioembolic etiology.  Neurology following. Underwent  TEE/loop recorder placement,done on 1/16.  TEE showed a small PFO not sure how significant that is PT/OT/speech done, recommended skilled nursing facility on discharge. LDL of 63.  Hemoglobin A1c of 4.7.  Neurology recommending to continue aspirin and Plavix for 3 weeks followed by aspirin alone.  Started on Lipitor 20 mg daily. Venous Doppler  ruled out DVT Echo showed EF of 55 to  60%, no regional wall motion, mild dilated left atrial size no intra-atrial shunt.  Transcranial Doppler with bubble study did not show any significant finding.   AKI: Resolved.   Leukocytosis: Mild.  Most likely reactive. Resolved.   Aortic dilation: Seen on echocardiogram.  - Recommend outpatient follow-up with cardiology.   Tobacco use:  - Continue nicotine patch.  - Counseled for cessation   History of hepatitis C: s/p treatment   Hypertension: Blood pressure stable.  - Continue amlodipine 5 mg daily   Debility/deconditioning:  - Pursuing SNF discharge  DVT prophylaxis: Lovenox Code Status: Full Family Communication: None at bedside Disposition Plan:  Status is: Inpatient  Remains inpatient appropriate because: Unsafe DC, awaiting SNF  Consultants:  None  Procedures:  None  Antimicrobials: None   Subjective: No new complaints, feels word finding/stuttering is about stable and right hand apraxia is stable.   Objective: Vitals:   06/12/21 0346 06/12/21 0828 06/12/21 1057 06/12/21 1501  BP: 114/75 117/68 107/71 104/67  Pulse: 76 89 89 100  Resp: 16 20 20 18   Temp: 97.8 F (36.6 C) 98.1 F (36.7 C) 98.1 F (36.7 C) 98.4 F (36.9 C)  TempSrc: Oral Oral Oral Oral  SpO2: 96% 98%  97%  Weight:      Height:        Intake/Output Summary (Last 24 hours) at 06/12/2021 1827 Last data filed at 06/12/2021 1700 Gross per 24 hour  Intake 880 ml  Output 500 ml  Net 380 ml   Filed Weights   06/03/21 1504 06/04/21 2200 06/10/21 0740  Weight: 81.6 kg 81.8 kg 81.8 kg    Gen: 66 y.o. male in no distress Pulm: Non-labored breathing room air. Clear to auscultation bilaterally.  CV: Regular rate and rhythm. No murmur, rub, or gallop. No JVD, no pitting pedal edema. GI: Abdomen soft, non-tender, non-distended, with normoactive bowel sounds. No organomegaly or masses felt. Ext: Warm, no deformities Skin: No rashes, lesions or ulcers on visualized skin Neuro: Alert  and oriented. Stumbling speech. No receptive aphasia. Right hand apraxia and mild weakness noted Psych: Judgement and insight appear normal. Mood & affect appropriate.   Data Reviewed: I have personally reviewed following labs and imaging studies  CBC: Recent Labs  Lab 06/10/21 0153 06/12/21 0148  WBC 12.4* 7.9  NEUTROABS 6.4 2.4  HGB 12.7* 12.2*  HCT 38.3* 37.4*  MCV 99.2 99.2  PLT 140* [id number]*   Basic Metabolic Panel: Recent Labs  Lab 06/10/21 0153 06/12/21 0148  NA 136 137  K 4.1 3.7  CL 105 104  CO2 23 24  GLUCOSE 118* 149*  BUN 14 13  CREATININE 1.33* 1.17  CALCIUM 8.9 9.0   GFR: Estimated Creatinine Clearance: 67.9 mL/min (by C-G formula based on SCr of 1.17 mg/dL). Liver Function Tests: No results for input(s): AST, ALT, ALKPHOS, BILITOT, PROT, ALBUMIN in the last 168 hours. No results for input(s): LIPASE, AMYLASE in the last 168 hours. No results for input(s): AMMONIA in the last 168 hours. Coagulation Profile: No results for input(s): INR, PROTIME in the last 168 hours. Cardiac Enzymes: No results for input(s): CKTOTAL, CKMB, CKMBINDEX, TROPONINI in the last 168 hours. BNP (last 3 results) No results for input(s): PROBNP in the last 8760 hours. HbA1C: No results for input(s): HGBA1C in the last 72 hours. CBG: No results for input(s): GLUCAP in the last 168 hours. Lipid Profile: No results for input(s): CHOL, HDL, LDLCALC, TRIG, CHOLHDL, LDLDIRECT in the last 72 hours. Thyroid Function Tests: No results for input(s): TSH, T4TOTAL, FREET4, T3FREE, THYROIDAB in the last 72 hours. Anemia Panel: No results for input(s): VITAMINB12, FOLATE, FERRITIN, TIBC, IRON, RETICCTPCT in the last 72 hours. Urine analysis:    Component Value Date/Time   COLORURINE YELLOW 06/04/2021 0030   APPEARANCEUR HAZY (A) 06/04/2021 0030   LABSPEC 1.029 06/04/2021 0030   PHURINE 5.0 06/04/2021 0030   GLUCOSEU NEGATIVE 06/04/2021 0030   HGBUR NEGATIVE 06/04/2021 0030   HGBUR  negative 07/25/2008 0000   BILIRUBINUR NEGATIVE 06/04/2021 0030   BILIRUBINUR neg 07/18/2013 1230   KETONESUR NEGATIVE 06/04/2021 0030   PROTEINUR 30 (A) 06/04/2021 0030   UROBILINOGEN 0.2 07/18/2013 1230   UROBILINOGEN 0.2 07/25/2008 0000   NITRITE NEGATIVE 06/04/2021 0030   LEUKOCYTESUR NEGATIVE 06/04/2021 0030   Recent Results (from the past 240 hour(s))  Resp Panel by RT-PCR (Flu A&B, Covid) Nasopharyngeal Swab     Status: None   Collection Time: 06/03/21  6:42 PM   Specimen: Nasopharyngeal Swab; Nasopharyngeal(NP) swabs in vial transport medium  Result Value Ref Range Status   SARS Coronavirus 2 by RT PCR NEGATIVE NEGATIVE Final    Comment: (NOTE) SARS-CoV-2 target nucleic acids are NOT DETECTED.  The SARS-CoV-2 RNA is generally detectable in upper respiratory specimens during the acute phase of infection. The lowest concentration of SARS-CoV-2 viral copies this assay can detect is 138 copies/mL. A negative result does not preclude SARS-Cov-2 infection and should not be used as the sole basis for treatment or other patient management decisions. A negative result may occur with  improper specimen collection/handling, submission of specimen other than nasopharyngeal swab, presence of viral mutation(s) within the areas targeted by this assay, and inadequate number of viral copies(<138 copies/mL). A negative result  must be combined with clinical observations, patient history, and epidemiological information. The expected result is Negative.  Fact Sheet for Patients:  EntrepreneurPulse.com.au  Fact Sheet for Healthcare Providers:  IncredibleEmployment.be  This test is no t yet approved or cleared by the [place] FDA and  has been authorized for detection and/or diagnosis of SARS-CoV-2 by FDA under an Emergency Use Authorization (EUA). This EUA will remain  in effect (meaning this test can be used) for the duration of the COVID-19  declaration under Section 564(b)(1) of the Act, 21 U.S.C.section 360bbb-3(b)(1), unless the authorization is terminated  or revoked sooner.       Influenza A by PCR NEGATIVE NEGATIVE Final   Influenza B by PCR NEGATIVE NEGATIVE Final    Comment: (NOTE) The Xpert Xpress SARS-CoV-2/FLU/RSV plus assay is intended as an aid in the diagnosis of influenza from Nasopharyngeal swab specimens and should not be used as a sole basis for treatment. Nasal washings and aspirates are unacceptable for Xpert Xpress SARS-CoV-2/FLU/RSV testing.  Fact Sheet for Patients: EntrepreneurPulse.com.au  Fact Sheet for Healthcare Providers: IncredibleEmployment.be  This test is not yet approved or cleared by the [place] FDA and has been authorized for detection and/or diagnosis of SARS-CoV-2 by FDA under an Emergency Use Authorization (EUA). This EUA will remain in effect (meaning this test can be used) for the duration of the COVID-19 declaration under Section 564(b)(1) of the Act, 21 U.S.C. section 360bbb-3(b)(1), unless the authorization is terminated or revoked.  Performed at [facility], [place] [address], [place], [place] [address]       Radiology Studies: No results found.  Scheduled Meds:  amLODipine  5 mg Oral Daily   aspirin EC  81 mg Oral Daily   atorvastatin  20 mg Oral Daily   clopidogrel  75 mg Oral Daily   enoxaparin (LOVENOX) injection  40 mg Subcutaneous [id number]   folic acid  1 mg Oral Daily   nicotine  14 mg Transdermal Daily   tamsulosin  0.4 mg Oral Daily   Continuous Infusions:   LOS: 9 days    Patrecia Pour, MD [facility] [email or web address] 06/12/2021, 6:27 PM
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
## 2021-06-12 NOTE — TOC Progression Note (Signed)
Transition of Care [facility]) - Progression Note    Patient Details  Name: Hezakiah Paladino MRN: [id number] Date of Birth: 1955-11-07  [facility]) CM/SW Contact  Pollie Friar, RN Phone Number: 06/12/2021, 2:59 PM  Clinical Narrative:    MD updated on need to have dc summary and pt ready for d/c prior to 10 am on Friday.  ToC following.   Expected Discharge Plan: [place] Barriers to Discharge: Continued Medical Work up, Unsafe home situation  Expected Discharge Plan and Services Expected Discharge Plan: [place] In-house Referral: Clinical Social Work   Post Acute Care Choice: [place] Living arrangements for the past 2 months: Apartment                                       Social Determinants of Health (SDOH) Interventions    Readmission Risk Interventions Readmission Risk Prevention Plan 11/29/2019  Post Dischage Appt Complete  Medication Screening Complete  Transportation Screening Complete  Some recent data might be hidden

## 2021-06-12 NOTE — Progress Notes (Signed)
Occupational Therapy Treatment Patient Details Name: Ryan Montgomery MRN: [id number] DOB: [birth year] Today's Date: 06/12/2021   History of present illness Ryan Montgomery is a 66 y.o. male presenting with syncopal episode with jerking motions, urinary incontinence and LOC. THC in UDS. PMH is significant for treated hepatitis C and gout, h/o stroke, h/o alcohol abuse, L hip sx in 08/2019.   OT comments  Pt making steady progress towards OT goals this session. Session focus on functional mobility and BADL reeducation. Pt currently requires min guard assist for household distance functional mobility in the room with no AD. Pt completed wash up at sink with overall supervision, pt sit<>standing throughout ADLS with supervision. Pt mostly flat during session but follows commands and appropriate during session. Pt would continue to benefit from skilled occupational therapy while admitted and after d/c to address the below listed limitations in order to improve overall functional mobility and facilitate independence with BADL participation. DC plan remains appropriate, will follow acutely per POC.      Recommendations for follow up therapy are one component of a multi-disciplinary discharge planning process, led by the attending physician.  Recommendations may be updated based on patient status, additional functional criteria and insurance authorization.    Follow Up Recommendations  Skilled nursing-short term rehab (<3 hours/day)    Assistance Recommended at Discharge Frequent or constant Supervision/Assistance  Patient can return home with the following  Assist for transportation;Help with stairs or ramp for entrance;Assistance with cooking/housework;Direct supervision/assist for financial management;Direct supervision/assist for medications management   Equipment Recommendations  BSC/3in1    Recommendations for Other Services      Precautions / Restrictions Precautions Precautions:  Fall Precaution Comments: Mildly impaired cognition Restrictions Weight Bearing Restrictions: No       Mobility Bed Mobility Overal bed mobility: Modified Independent                  Transfers Overall transfer level: Needs assistance Equipment used: None Transfers: Sit to/from Stand Sit to Stand: Supervision           General transfer comment: pt sit<>standing multiple times at sink with supervision and good safety awareness     Balance Overall balance assessment: Needs assistance Sitting-balance support: Feet supported, No upper extremity supported Sitting balance-Leahy Scale: Fair     Standing balance support: Single extremity supported, No upper extremity supported Standing balance-Leahy Scale: Fair Standing balance comment: standing at tsink for ADls with no UE support or LOB                           ADL either performed or assessed with clinical judgement   ADL Overall ADL's : Needs assistance/impaired     Grooming: Wash/dry face;Standing;Supervision/safety   Upper Body Bathing: Supervision/ safety;Sitting;Standing   Lower Body Bathing: Sit to/from stand;Supervison/ safety   Upper Body Dressing : Minimal assistance;Sitting       Toilet Transfer: Min guard;Ambulation Toilet Transfer Details (indicate cue type and reason): simulated via functional mobility         Functional mobility during ADLs: Min guard General ADL Comments: pt completed short distance functional mobility in room with no AD with min guard and completed ADLs at sink both in sitting and standing    Extremity/Trunk Assessment Upper Extremity Assessment Upper Extremity Assessment: Overall WFL for tasks assessed (not deficits noted this session, able to open all caps at sink and complete bathing with BUEs)   Lower Extremity Assessment Lower Extremity Assessment: Defer  to PT evaluation   Cervical / Trunk Assessment Cervical / Trunk Assessment: Normal    Vision    Additional Comments: noted to perfer L gaze but needs further assessment   Perception Perception Perception: Within Functional Limits   Praxis Praxis Praxis: Intact    Cognition Arousal/Alertness: Awake/alert Behavior During Therapy: WFL for tasks assessed/performed, Flat affect Overall Cognitive Status: No family/caregiver present to determine baseline cognitive functioning                                 General Comments: overall WFL for ADLs and mobility tasks but would benefit from higher level cog assessment        Exercises      Shoulder Instructions       General Comments Hr max 118 bpm    Pertinent Vitals/ Pain       Pain Assessment Pain Assessment: No/denies pain  Home Living                                          Prior Functioning/Environment              Frequency  Min 2X/week        Progress Toward Goals  OT Goals(current goals can now be found in the care plan section)  Progress towards OT goals: Progressing toward goals  Acute Rehab OT Goals Patient Stated Goal: to go to rehab OT Goal Formulation: With patient Time For Goal Achievement: 06/19/21 Potential to Achieve Goals: Good  Plan Discharge plan remains appropriate;Frequency remains appropriate    Co-evaluation                 AM-PAC OT "6 Clicks" Daily Activity     Outcome Measure   Help from another person eating meals?: None Help from another person taking care of personal grooming?: A Little Help from another person toileting, which includes using toliet, bedpan, or urinal?: A Little Help from another person bathing (including washing, rinsing, drying)?: A Little Help from another person to put on and taking off regular upper body clothing?: None Help from another person to put on and taking off regular lower body clothing?: A Little 6 Click Score: 20    End of Session    OT Visit Diagnosis: Other abnormalities of gait and  mobility (R26.89);History of falling (Z91.81);Muscle weakness (generalized) (M62.81)   Activity Tolerance Patient tolerated treatment well   Patient Left in chair;with call bell/phone within reach;with chair alarm set   Nurse Communication Mobility status;Other (comment) (via secure chat)        [id number] OT Time Calculation (min): 25 min  Charges: OT General Charges $OT Visit: 1 Visit OT Treatments $Self Care/Home Management : 23-37 mins  Harley Alto., COTA/L Acute Rehabilitation Services [phone]   Precious Haws 06/12/2021, 9:00 AM

## 2021-06-13 LAB — RESP PANEL BY RT-PCR (FLU A&B, COVID) ARPGX2
Influenza A by PCR: NEGATIVE
Influenza B by PCR: NEGATIVE
SARS Coronavirus 2 by RT PCR: NEGATIVE

## 2021-06-13 MED ORDER — CLOPIDOGREL BISULFATE 75 MG PO TABS: 75.0000 mg | ORAL_TABLET | Freq: Every day | ORAL | 0 refills | Status: AC

## 2021-06-13 MED ORDER — ATORVASTATIN CALCIUM 20 MG PO TABS: 20.0000 mg | ORAL_TABLET | Freq: Every day | ORAL | Status: AC

## 2021-06-13 MED ORDER — AMLODIPINE BESYLATE 5 MG PO TABS: 5.0000 mg | ORAL_TABLET | Freq: Every day | ORAL | Status: DC

## 2021-06-13 MED ORDER — ASPIRIN 81 MG PO TBEC: 81.0000 mg | DELAYED_RELEASE_TABLET | Freq: Every day | ORAL | Status: AC

## 2021-06-13 NOTE — Progress Notes (Signed)
Assessed the loop recorder site but he refused. Patient stated Doctor said do not allow anyone to remove dressing. Explained that dressing have been for four days without anyone having assessed the site. Also, explained that the dressing need to be removed so that the site could be assessed for any swelling, redness, drainage, odor. Still refused to allow dressing to be removed. Offered to remove the dressing and assess site, and replace with a new dressing, he still refused. We explained the possible negative outcome of not assessing the site and he said he understands but he did not allowed [place] the dressing  to be remove and he will talk to the Doctor today.

## 2021-06-13 NOTE — Progress Notes (Addendum)
Physical Therapy Treatment Patient Details Name: Diar Felmlee MRN: [id number] DOB: 1955/10/25 Today's Date: 06/13/2021   History of Present Illness Trisha Campe is a 66 y.o. male presenting with syncopal episode with jerking motions, urinary incontinence and LOC. THC in UDS. PMH is significant for treated hepatitis C and gout, h/o stroke, h/o alcohol abuse, L hip sx in 08/2019.    PT Comments    Pt received in supine, agreeable to therapy session and with good participation and tolerance for gait and stair progression in hallway and toilet transfers. Pt needing up to min guard for safety with transfers due to decreased eccentric control to sit and decreased safety in narrow room space with bowel urgency and needing up to min assist to ascend/descend steps safely with cane and single railing per home setup. Pt making good progress toward mobility goals but when given multi-step (3-4 component) wayfinding task, pt forgetting to perform 50% of task which indicates continued memory/higher level cognitive deficit. Pt needing 29.83 seconds to perform 5 x STS activity which indicates a risk of recurrent falls (scores > 15 seconds per Buatois, et al, 2010).  Pt continues to benefit from PT services to progress toward functional mobility goals. Per chart review, pt does not have constant supervision on discharge and continues to need physical assist for ADL/mobility tasks, therefore changed rec back to SNF per discussion with supervising PT, pt agreeable.    Recommendations for follow up therapy are one component of a multi-disciplinary discharge planning process, led by the attending physician.  Recommendations may be updated based on patient status, additional functional criteria and insurance authorization.  Follow Up Recommendations  Skilled nursing-short term rehab (<3 hours/day)     Assistance Recommended at Discharge Frequent or constant Supervision/Assistance  Patient can return home with the  following Direct supervision/assist for medications management;Assistance with cooking/housework;Help with stairs or ramp for entrance;A little help with bathing/dressing/bathroom;A little help with walking and/or transfers;Direct supervision/assist for financial management;Assist for transportation   Engineer, technical sales    Recommendations for Other Services       Precautions / Restrictions Precautions Precautions: Fall Precaution Comments: Mildly impaired cognition Restrictions Weight Bearing Restrictions: No     Mobility  Bed Mobility Overal bed mobility: Modified Independent             General bed mobility comments: increased time, able to perform from flat bed    Transfers Overall transfer level: Needs assistance Equipment used: Straight cane Transfers: Sit to/from Stand Sit to Stand: Supervision, Min guard           General transfer comment: from EOB and to toilet height with SPC, min guard for safety with toilet transfer due to multiple gowns and pt incoordination in tight space.    Ambulation/Gait Ambulation/Gait assistance: Min guard Gait Distance (Feet): 250 Feet (x2 with brief standing break to rest) Assistive device: Straight cane Gait Pattern/deviations: Step-through pattern Gait velocity: WFL     General Gait Details: mildly antalgic   Stairs Stairs: Yes Stairs assistance: Min assist Stair Management: One rail Left, Forwards, With cane Number of Stairs: 5 General stair comments: max directional verbal cues for sequencing, pt unable to sequence cane without tactile cues for optimal support during stair negotiation, up to minA for descending safely due to weakness/LLE discomfort from previous hip sx   Wheelchair Mobility    Modified Rankin (Stroke Patients Only) Modified Rankin (Stroke Patients Only) Pre-Morbid Rankin Score: Slight disability Modified Rankin: Moderate disability     Balance  Overall balance assessment:  Modified Independent Sitting-balance support: Feet supported Sitting balance-Leahy Scale: Normal     Standing balance support: Single extremity supported, During functional activity Standing balance-Leahy Scale: Fair Standing balance comment: standing at sink for handwashing unsupported no LOB, reliant on cane for gait progression.                            Cognition Arousal/Alertness: Awake/alert Behavior During Therapy: WFL for tasks assessed/performed Overall Cognitive Status: No family/caregiver present to determine baseline cognitive functioning                                 General Comments: Pt given multi-step instructions for wayfinding task in hallway, ignoring cues for half of tasks and when asked if he needed rest break or to return to room, pt states "no", then after performing stair trial pt reports "I have to get to the bathroom" and rushing to toilet (pt had thought he would be allowed to use visitor toilet but was agreeable to return to pt room prior to toileting).        Exercises Other Exercises Other Exercises: seated BLE AROM: hip flexion, LAQ x10 reps ea Other Exercises: reciprocal STS x 5 (unable to perform with arms crossed, needs 1-2 UE support to stand) from chair in 29.83 seconds Other Exercises: portion of gait billed as TE for BLE strengthening    General Comments General comments (skin integrity, edema, etc.): HR WFL per tele, no dizziness or acute s/sx distress      Pertinent Vitals/Pain Pain Assessment Pain Assessment: Faces Faces Pain Scale: Hurts a little bit Pain Location: mildly antalgic gait pattern, pt denies pain when prompted Pain Intervention(s): Monitored during session     PT Goals (current goals can now be found in the care plan section) Acute Rehab PT Goals Patient Stated Goal: home PT Goal Formulation: With patient Time For Goal Achievement: 06/19/21 Progress towards PT goals: Progressing toward  goals    Frequency    Min 3X/week      PT Plan Discharge plan needs to be updated;Frequency needs to be updated       AM-PAC PT "6 Clicks" Mobility   Outcome Measure  Help needed turning from your back to your side while in a flat bed without using bedrails?: None Help needed moving from lying on your back to sitting on the side of a flat bed without using bedrails?: A Little Help needed moving to and from a bed to a chair (including a wheelchair)?: A Lot (mod cues for safety awareness) Help needed standing up from a chair using your arms (e.g., wheelchair or bedside chair)?: A Little Help needed to walk in hospital room?: A Little Help needed climbing 3-5 steps with a railing? : A Lot (mod cues for safety awareness) 6 Click Score: 17    End of Session Equipment Utilized During Treatment: Gait belt Activity Tolerance: Patient tolerated treatment well Patient left: in chair;with call bell/phone within reach;with chair alarm set Nurse Communication: Mobility status;Other (comment) (pt needs urinal measured/dumped so he can use again, pt had BM during session) PT Visit Diagnosis: Unsteadiness on feet (R26.81);Other abnormalities of gait and mobility (R26.89)     Time: [id number] PT Time Calculation (min) (ACUTE ONLY): 27 min  Charges:  $Gait Training: 8-22 mins $Therapeutic Exercise: 8-22 mins  Houston Siren., PTA Acute Rehabilitation Services Pager: [phone] Office: [place] 06/13/2021, 3:06 PM

## 2021-06-13 NOTE — Progress Notes (Signed)
°  PROGRESS NOTE  Ryan Montgomery  WEX:[id number] DOB: May 18, 1956 DOA: 06/03/2021 PCP: Nelwyn Salisbury, MD   Brief Narrative: Patient is a 66 year old male with history of tobacco abuse, prior stroke with no residual deficits, hepatitis C who presented to the emergency department with complaints of 1 week history of slurred speech, falls.  MRI on presentation showed acute to subacute nonhemorrhagic right pontine infarct, scattered nonhemorrhagic infarcts involving the cortical and subcortical aspect of the left frontoparietal and occipital lobe concerning for watershed distribution.  MRA of the head and neck did not show any significant stenosis.  Suspicion for cardioembolic etiology.  Neurology were following. Underwent TEE/loop recorder placement.  Stroke work-up completed.  PT/OT evaluation done,recommended SNF.  Medically stable for discharge as soon as bed is available.  - Recommend follow up with neurology in 6-8 weeks post stroke admission. Continue statin. Continue DAPT x3 weeks, then aspirin alone indefinitely. 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  - Continue steristrips on ILR site unless signs or symptoms of infection develop.    Assessment & Plan: Principal Problem:   Acute CVA (cerebrovascular accident) (HCC) Active Problems:   AKI (acute kidney injury) (HCC)   Tobacco use   Aortic dilatation (HCC)   Dysphagia  Acute ischemic stroke: Presented with 1 week history of slurred speech, falls, weakness. MRI on presentation showed acute to subacute nonhemorrhagic right pontine infarct, scattered nonhemorrhagic infarcts involving the cortical and subcortical aspect of the left frontoparietal and occipital lobe concerning for watershed distribution. No LVO on MRA head/neck. Negative transcranial doppler study. - s/p TEE/loop recorder implantation 06/10/2021 due to suspicion of embolic etiology. No atrial  fibrillation noted during admission and no CES seen on echo.  - Started atorvastatin 20mg . LDL 63. HbA1c 4.7%.  - Neurology recommends ASA 81mg , plavix 75mg  daily for 3 weeks followed by aspirin alone. - Recommend neurology follow up in 6-8 weeks.   PFO: Seen on echo. LE venous U/S showed no DVT.     AKI: Resolved.   Leukocytosis: Mild.  Most likely reactive. Resolved.   Aortic dilation: 39mm by echocardiogram.  - Recommend outpatient follow-up with cardiology.   Tobacco use:  - Continue nicotine patch.  - Counseled for cessation   History of hepatitis C: s/p treatment   Hypertension: Blood pressure stable.  - Continue amlodipine 5 mg daily   Debility/deconditioning:  - Pursuing SNF discharge  DVT prophylaxis: Lovenox Code Status: Full Family Communication: None at bedside Disposition Plan:  Status is: Inpatient  Remains inpatient appropriate because: Unsafe DC, awaiting SNF 1/20.  Consultants:  None  Procedures:  None  Antimicrobials: None   Subjective: Eating breakfast, remains high fall risk per PT.  Objective: BP 111/80 (BP Location: Left Arm)    Pulse (!) 109    Temp 98.4 F (36.9 C) (Oral)    Resp 20    Ht 5\' 11"  (1.803 m)    Wt 81.8 kg    SpO2 100%    BMI 25.15 kg/m   Gen: 66 y.o. male in no distress Pulm: Nonlabored breathing room air. Clear. CV: Regular, no significant dependent edema. Ext: Warm, no deformities Skin: No new rashes, lesions or ulcers on visualized skin. Neuro: Alert and oriented. Speech difficulties stable, R hand apraxia improved. No new focal neurological deficits.   40m, MD [facility] [email or web address] 06/13/2021, 4:17 PM

## 2021-06-14 NOTE — TOC Transition Note (Addendum)
Transition of Care [facility]) - CM/SW Discharge Note   Patient Details  Name: Ryan Montgomery MRN: [id number] Date of Birth: 24-Oct-1955  Transition of Care [facility]) CM/SW Contact:  Kermit Balo, RN Phone Number: 06/14/2021, 8:13 AM   Clinical Narrative:    [facility] Summary faxed: [phone]. Bedside RN aware of 10 am pickup. Discharge packet is at the desk.   Number for report: [phone]  1500: [facility] never showed [facility] or called. CM has called the community line for the [place] and his SW repeatedly with no return call. [facility] department has agreed to pay for transport to [place]. [place] and his daughter updated. Pt transporting with [employer].    Final next level of care: Skilled Nursing Facility Barriers to Discharge: No Barriers Identified   Patient Goals and CMS Choice   CMS Medicare.gov Compare Post Acute Care list provided to:: Patient Represenative (must comment) Choice offered to / list presented to : Adult Children  Discharge Placement              Patient chooses bed at:  [facility]) Patient to be transferred to [facility] by[facility] Name of family member notified: [name] Patient and family notified of of transfer: 06/14/21  Discharge Plan and Services In-house Referral: Clinical Social Work   Post Acute Care Choice: Skilled Nursing Facility                               Social Determinants of Health (SDOH) Interventions     Readmission Risk Interventions Readmission Risk Prevention Plan 11/29/2019  Post Dischage Appt Complete  Medication Screening Complete  Transportation Screening Complete  Some recent data might be hidden

## 2021-06-14 NOTE — Discharge Summary (Signed)
Physician Discharge Summary  Ryan Montgomery [id number] DOB: 01-Nov-1955 DOA: 06/03/2021  PCP: Laurey Morale, MD  Admit date: 06/03/2021 Discharge date: 06/14/2021  Admitted From: Home Disposition: SNF   Recommendations for Outpatient Follow-up:  - Follow up with PCP in 1-2 weeks - Recommend follow up with neurology in 6-8 weeks post stroke admission. Continue statin. Continue DAPT x3 weeks, then aspirin alone indefinitely. - Recommend surveillance of 58mm aortic root dilatation - Follow up with cardiology/EP as scheduled on 1/26 at 2:00pm.  - Continue steristrips on ILR site unless signs or symptoms of infection develop.   Home Health: N/A Equipment/Devices: Per SNF Discharge Condition: Stable CODE STATUS: Full Diet recommendation: Heart healthy  Brief/Interim Summary: Patient is a 66 year old male with history of tobacco abuse, prior stroke with no residual deficits, hepatitis C who presented to the emergency department with complaints of 1 week history of slurred speech, falls.  MRI on presentation showed acute to subacute nonhemorrhagic right pontine infarct, scattered nonhemorrhagic infarcts involving the cortical and subcortical aspect of the left frontoparietal and occipital lobe concerning for watershed distribution.  MRA of the head and neck did not show any significant stenosis.  Suspicion for cardioembolic etiology.  Neurology were following. Underwent TEE/loop recorder placement.  Stroke work-up completed.  PT/OT evaluation done,recommended SNF.     Discharge Diagnoses:  Principal Problem:   Acute CVA (cerebrovascular accident) ([place]) Active Problems:   AKI (acute kidney injury) ([place])   Tobacco use   Aortic dilatation ([place])   Dysphagia  Acute ischemic stroke: Presented with 1 week history of slurred speech, falls, weakness. MRI on presentation showed acute to subacute nonhemorrhagic right pontine infarct, scattered nonhemorrhagic infarcts involving the cortical and  subcortical aspect of the left frontoparietal and occipital lobe concerning for watershed distribution. No LVO on MRA head/neck. Negative transcranial doppler study. - s/p TEE/loop recorder implantation 06/10/2021 due to suspicion of embolic etiology. No atrial fibrillation noted during admission and no CES seen on echo.  - Started atorvastatin 20mg . LDL 63. HbA1c 4.7%.  - Neurology recommends ASA 81mg , plavix 75mg  daily for 3 weeks followed by aspirin alone. - Recommend neurology follow up in 6-8 weeks.    PFO: Seen on echo. LE venous U/S showed no DVT.      AKI: Resolved.   Leukocytosis: Mild.  Most likely reactive. Resolved.   Aortic dilation: 28mm by echocardiogram.  - Recommend outpatient follow-up with cardiology.   Tobacco use:  - Continue nicotine patch.  - Counseled for cessation   History of hepatitis C: s/p treatment   Hypertension: Blood pressure stable.  - Continue amlodipine 5 mg daily   Debility/deconditioning:  - Pursuing SNF discharge  Discharge Instructions  Allergies as of 06/14/2021       Reactions   Moxifloxacin Hives        Medication List     TAKE these medications    amLODipine 5 MG tablet Commonly known as: NORVASC Take 1 tablet (5 mg total) by mouth daily.   aspirin 81 MG EC tablet Take 1 tablet (81 mg total) by mouth daily. Swallow whole.   atorvastatin 20 MG tablet Commonly known as: LIPITOR Take 1 tablet (20 mg total) by mouth daily.   clopidogrel 75 MG tablet Commonly known as: PLAVIX Take 1 tablet (75 mg total) by mouth daily for 21 days.   diclofenac 75 MG EC tablet Commonly known as: VOLTAREN Take 1 tablet (75 mg total) by mouth 2 (two) times daily.   tamsulosin 0.4 MG Caps  capsule Commonly known as: FLOMAX Take 1 capsule (0.4 mg total) by mouth daily after supper.        Follow-up Information     Laurey Morale, MD Follow up.   Specialty: Family Medicine Contact information: [place]  [address] [phone]         [facility]. Go on 06/20/2021.   Specialty: Cardiology Why: For wound re-check at 2:00pm Contact information: [address] [place] [address] [phone]               Allergies  Allergen Reactions   Moxifloxacin Hives    Consultations: Neurology  Procedures/Studies: DG Chest 2 View  Result Date: 06/03/2021 CLINICAL DATA:  Intermittent slurred speech with frequent falls and confusion EXAM: CHEST - 2 VIEW COMPARISON:  Chest x-ray 12/28/2020, CT 09/09/2020 FINDINGS: The heart size and mediastinal contours are within normal limits. Both lungs are clear. Severe chronic compression fracture at T12. IMPRESSION: No active cardiopulmonary disease. Electronically Signed   By: Donavan Foil M.D.   On: 06/03/2021 15:49   CT Head Wo Contrast  Result Date: 06/03/2021 CLINICAL DATA:  Mental status change. EXAM: CT HEAD WITHOUT CONTRAST TECHNIQUE: Contiguous axial images were obtained from the base of the skull through the vertex without intravenous contrast. COMPARISON:  11/27/2018 FINDINGS: Brain: No acute intracranial hemorrhage. No focal mass lesion. No CT evidence of acute infarction. No midline shift or mass effect. No hydrocephalus. Basilar cisterns are patent. Remote small RIGHT frontal cortical and subcortical infarction. Extensive periventricular and subcortical white matter hypodensities. Generalized cortical atrophy. Vascular: No hyperdense vessel or unexpected calcification. Skull: Normal. Negative for fracture or focal lesion. Sinuses/Orbits: Paranasal sinuses and mastoid air cells are clear. Orbits are clear. Other: None. IMPRESSION: 1. No acute intracranial findings. 2. 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 3. Remote RIGHT frontal infarction. Electronically Signed   By: Suzy Bouchard M.D.   On: 06/03/2021 16:34   MR ANGIO HEAD WO CONTRAST  Result Date:  06/04/2021 CLINICAL DATA:  Initial evaluation for acute stroke. EXAM: MRA NECK WITHOUT AND WITH CONTRAST MRA HEAD WITHOUT CONTRAST TECHNIQUE: Multiplanar and multiecho pulse sequences of the neck were obtained without and with intravenous contrast. Angiographic images of the neck were obtained using MRA technique without and with intravenous contrast; Angiographic images of the Circle of Willis were obtained using MRA technique without intravenous contrast. CONTRAST:  9 cc of Gadavist. COMPARISON:  Previous MRI from earlier the same day. FINDINGS: MRA NECK FINDINGS AORTIC ARCH: Partially visualized aortic arch normal in caliber with normal branch pattern. No stenosis seen about the origin of the great vessels. RIGHT CAROTID SYSTEM: Right common and internal carotid arteries patent without stenosis or dissection. No significant atheromatous narrowing about the right carotid bulb. LEFT CAROTID SYSTEM: Left common and internal carotid arteries patent without stenosis or dissection. No significant atheromatous narrowing about the left carotid wall. VERTEBRAL ARTERIES: Both vertebral arteries arise from the subclavian arteries. Right vertebral artery slightly dominant. No visible proximal subclavian artery stenosis. Vertebral arteries patent without stenosis or evidence for dissection. MRA HEAD FINDINGS ANTERIOR CIRCULATION: Both internal carotid arteries widely patent to the termini without stenosis or other abnormality. A1 segments widely patent. Normal anterior communicating artery complex. Anterior cerebral arteries widely patent without stenosis. No M1 stenosis or occlusion. Normal MCA bifurcations. Distal MCA branches well perfused and symmetric. POSTERIOR CIRCULATION: Visualized V4 segments widely patent bilaterally. Right vertebral artery dominant. Partially  visualized PICA patent bilaterally. Basilar patent to its distal aspect without stenosis. Superior cerebellar are and posterior cerebral arteries widely  patent bilaterally. Small bilateral posterior communicating arteries noted. Anatomic variants: None significant.  No aneurysm. IMPRESSION: Negative MRA of the head and neck. No large vessel occlusion. No hemodynamically significant or correctable stenosis. Electronically Signed   By: Jeannine Boga M.D.   On: 06/04/2021 00:30   MR Angiogram Neck W or Wo Contrast  Result Date: 06/04/2021 CLINICAL DATA:  Initial evaluation for acute stroke. EXAM: MRA NECK WITHOUT AND WITH CONTRAST MRA HEAD WITHOUT CONTRAST TECHNIQUE: Multiplanar and multiecho pulse sequences of the neck were obtained without and with intravenous contrast. Angiographic images of the neck were obtained using MRA technique without and with intravenous contrast; Angiographic images of the Circle of Willis were obtained using MRA technique without intravenous contrast. CONTRAST:  9 cc of Gadavist. COMPARISON:  Previous MRI from earlier the same day. FINDINGS: MRA NECK FINDINGS AORTIC ARCH: Partially visualized aortic arch normal in caliber with normal branch pattern. No stenosis seen about the origin of the great vessels. RIGHT CAROTID SYSTEM: Right common and internal carotid arteries patent without stenosis or dissection. No significant atheromatous narrowing about the right carotid bulb. LEFT CAROTID SYSTEM: Left common and internal carotid arteries patent without stenosis or dissection. No significant atheromatous narrowing about the left carotid wall. VERTEBRAL ARTERIES: Both vertebral arteries arise from the subclavian arteries. Right vertebral artery slightly dominant. No visible proximal subclavian artery stenosis. Vertebral arteries patent without stenosis or evidence for dissection. MRA HEAD FINDINGS ANTERIOR CIRCULATION: Both internal carotid arteries widely patent to the termini without stenosis or other abnormality. A1 segments widely patent. Normal anterior communicating artery complex. Anterior cerebral arteries widely patent  without stenosis. No M1 stenosis or occlusion. Normal MCA bifurcations. Distal MCA branches well perfused and symmetric. POSTERIOR CIRCULATION: Visualized V4 segments widely patent bilaterally. Right vertebral artery dominant. Partially visualized PICA patent bilaterally. Basilar patent to its distal aspect without stenosis. Superior cerebellar are and posterior cerebral arteries widely patent bilaterally. Small bilateral posterior communicating arteries noted. Anatomic variants: None significant.  No aneurysm. IMPRESSION: Negative MRA of the head and neck. No large vessel occlusion. No hemodynamically significant or correctable stenosis. Electronically Signed   By: Jeannine Boga M.D.   On: 06/04/2021 00:30   MR Brain Wo Contrast (neuro protocol)  Result Date: 06/03/2021 CLINICAL DATA:  Initial evaluation for neuro deficit, stroke suspected./ EXAM: MRI HEAD WITHOUT CONTRAST TECHNIQUE: Multiplanar, multiecho pulse sequences of the brain and surrounding structures were obtained without intravenous contrast. COMPARISON:  Head CT from earlier the same day. FINDINGS: Brain: Cerebral volume within normal limits for age. Patchy and confluent T2/FLAIR hyperintensity involving the periventricular deep white matter both cerebral hemispheres most consistent with chronic small vessel ischemic disease, moderate to advanced in nature. Multiple scattered remote lacunar infarcts present about the bilateral basal ganglia/hemispheric cerebral white matter. Chronic right MCA territory infarct noted at the high right frontal lobe. Additional chronic left cerebellar infarcts noted. Additional small areas of encephalomalacia and gliosis involving the anterior frontal poles favored to be related to remote trauma. 12 mm acute ischemic infarcts seen involving the right pons (series 5, image 62). Additional scattered acute ischemic infarcts seen involving the cortical and subcortical aspect of the left frontal, parietal, and  occipital regions, consistent with acute ischemic infarcts as well (series 5, image 84). These are watershed in distribution. No associated hemorrhage or significant mass effect about these areas of infarction. Otherwise, gray-white matter  differentiation maintained. No areas of acute or chronic intracranial hemorrhage. No mass lesion, midline shift or mass effect. No hydrocephalus or extra-axial fluid collection. Pituitary gland suprasellar region normal. Midline structures intact. Vascular: Major intracranial vascular flow voids are maintained. Skull and upper cervical spine: Craniocervical junction within normal limits. Bone marrow signal intensity normal. No scalp soft tissue abnormality. Sinuses/Orbits: Globes and orbital soft tissues demonstrate no acute finding. Mild scattered mucosal thickening noted within the ethmoidal air cells. Paranasal sinuses are otherwise clear. Trace right mastoid effusion noted, of doubtful significance. Other: None. IMPRESSION: 1. 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 2. Additional scattered acute ischemic nonhemorrhagic infarcts involving the cortical and subcortical aspect of the left frontal, parietal, and occipital lobes. These are watershed in distribution. 3. Underlying moderate to advanced chronic microvascular ischemic disease with multiple remote infarcts involving the right frontal lobe, bilateral basal ganglia/hemispheric cerebral white matter, left cerebellum. 4. Additional small areas of encephalomalacia involving the anterior frontal lobes bilaterally, likely related to remote trauma. Electronically Signed   By: Jeannine Boga M.D.   On: 06/03/2021 20:09   EP PPM/ICD IMPLANT  Result Date: 06/10/2021 SURGEON:  Allegra Lai, MD   PREPROCEDURE DIAGNOSIS:  Cryptogenic Stroke   POSTPROCEDURE DIAGNOSIS:  Cryptogenic Stroke    PROCEDURES:  1. Implantable loop recorder implantation   INTRODUCTION:  Ryan Montgomery is a 66 y.o. male with a  history of unexplained stroke who presents today for implantable loop implantation.  The patient has had a cryptogenic stroke.  Despite an extensive workup by neurology, no reversible causes have been identified.  he has worn telemetry during which he did not have arrhythmias.  There is significant concern for possible atrial fibrillation as the cause for the patients stroke.  The patient therefore presents today for implantable loop implantation.   DESCRIPTION OF PROCEDURE:  Informed written consent was obtained, and the patient was brought to the electrophysiology lab in a fasting state.  The patient required no sedation for the procedure today.  Mapping over the patient's chest was performed by the EP lab staff to identify the area where electrograms were most prominent for ILR recording.  This area was found to be the left parasternal region over the 3rd-4th intercostal space. The patients left chest was therefore prepped and draped in the usual sterile fashion by the EP lab staff. The skin overlying the left parasternal region was infiltrated with lidocaine for local analgesia.  A 0.5-cm incision was made over the left parasternal region over the 3rd intercostal space.  A subcutaneous ILR pocket was fashioned using a combination of sharp and blunt dissection.  A Medtronic Reveal Linq model LINQ SN [id number] implantable loop recorder was then placed into the pocket  R waves were very prominent and measured 0.60mV. EBL<1 ml.  Steri- Strips and a sterile dressing were then applied.  There were no early apparent complications.   CONCLUSIONS:  1. Successful implantation of a Medtronic Reveal LINQ implantable loop recorder for cryptogenic stroke  2. No early apparent complications.   VAS [place] TRANSCRANIAL DOPPLER W BUBBLES  Result Date: 06/10/2021  Transcranial Doppler with Bubble Patient Name:  Ryan Montgomery  Date of Exam:   06/08/2021 Medical Rec #: [id number]        Accession #:    [id number] Date of Birth:  1956/01/06         Patient Gender: M Patient Age:   66 years Exam Location:  [facility] Procedure:      VAS  [place] TRANSCRANIAL DOPPLER W BUBBLES Referring Phys: DEVON SHAFER --------------------------------------------------------------------------------  Indications: MRI brain showing 12 mm acute ischemic nonhemorrhagic right pontine infarct. Additional scattered acute ischemic nonhemorrhagic infarcts involving the cortical and subcortical aspect of the left frontal, parietal, and occipital lobes. These are watershed in distribution. History: Slurred speech, falls, and confusion x1 week. Limitations for diagnostic windows: Unable to insonate right transtemporal window. Unable to insonate left transtemporal window. Comparison Study: No prior study Performing Technologist: Sharion Dove RVS  Examination Guidelines: A complete evaluation includes B-mode imaging, spectral Doppler, color Doppler, and power Doppler as needed of all accessible portions of each vessel. Bilateral testing is considered an integral part of a complete examination. Limited examinations for reoccurring indications may be performed as noted.  Summary:  A vascular evaluation was performed. The right Opthalmic was studied. An IV was inserted into the patient's right forearm. Verbal informed consent was obtained.  Minimal hits noted with Valsalva. No significant findings. *See table(s) above for TCD measurements and observations.  Diagnosing physician: Antony Contras MD Electronically signed by Antony Contras MD on 06/10/2021 at 12:09:11 PM.    Final    ECHOCARDIOGRAM COMPLETE  Result Date: 06/04/2021    ECHOCARDIOGRAM REPORT   Patient Name:   Ryan Montgomery Date of Exam: 06/04/2021 Medical Rec #:  [id number]       Height:       71.0 in Accession #:    [id number]      Weight:       180.0 lb Date of Birth:  05-12-1956        BSA:          2.016 m Patient Age:    28 years        BP:           133/68 mmHg Patient Gender: M               HR:            58 bpm. Exam Location:  Inpatient Procedure: 2D Echo, Cardiac Doppler and Color Doppler Indications:    Stroke  History:        Patient has prior history of Echocardiogram examinations.  Sonographer:    Jyl Heinz Referring Phys: [place]  1. Left ventricular ejection fraction, by estimation, is 55 to 60%. The left ventricle has normal function. The left ventricle has no regional wall motion abnormalities. There is mild concentric left ventricular hypertrophy. Left ventricular diastolic parameters were normal.  2. Right ventricular systolic function is normal. The right ventricular size is normal. There is normal pulmonary artery systolic pressure.  3. Left atrial size was mildly dilated.  4. The mitral valve is normal in structure. Trivial mitral valve regurgitation. No evidence of mitral stenosis.  5. The aortic valve is tricuspid. Aortic valve regurgitation is mild. No aortic stenosis is present.  6. Aortic dilatation noted. There is moderate dilatation of the aortic root, measuring 44 mm.  7. The inferior vena cava is normal in size with greater than 50% respiratory variability, suggesting right atrial pressure of 3 mmHg. Comparison(s): No significant change from prior study. Prior images reviewed side by side. FINDINGS  Left Ventricle: Left ventricular ejection fraction, by estimation, is 55 to 60%. The left ventricle has normal function. The left ventricle has no regional wall motion abnormalities. The left ventricular internal cavity size was normal in size. There is  mild concentric left ventricular hypertrophy. Left ventricular diastolic parameters were normal. Normal left ventricular  filling pressure. Right Ventricle: The right ventricular size is normal. No increase in right ventricular wall thickness. Right ventricular systolic function is normal. There is normal pulmonary artery systolic pressure. The tricuspid regurgitant velocity is 2.50 m/s, and  with an assumed right  atrial pressure of 3 mmHg, the estimated right ventricular systolic pressure is [id number] mmHg. Left Atrium: Left atrial size was mildly dilated. Right Atrium: Right atrial size was normal in size. Pericardium: There is no evidence of pericardial effusion. Mitral Valve: The mitral valve is normal in structure. Trivial mitral valve regurgitation. No evidence of mitral valve stenosis. Tricuspid Valve: The tricuspid valve is normal in structure. Tricuspid valve regurgitation is trivial. No evidence of tricuspid stenosis. Aortic Valve: The aortic valve is tricuspid. Aortic valve regurgitation is mild. Aortic regurgitation PHT measures 605 msec. No aortic stenosis is present. Aortic valve peak gradient measures 3.6 mmHg. Pulmonic Valve: The pulmonic valve was grossly normal. Pulmonic valve regurgitation is mild. No evidence of pulmonic stenosis. Aorta: Aortic dilatation noted. There is moderate dilatation of the aortic root, measuring 44 mm. Venous: The inferior vena cava is normal in size with greater than 50% respiratory variability, suggesting right atrial pressure of 3 mmHg. IAS/Shunts: No atrial level shunt detected by color flow Doppler.  LEFT VENTRICLE PLAX 2D LVIDd:         4.50 cm      Diastology LVIDs:         3.20 cm      LV e' medial:    6.74 cm/s LV PW:         1.20 cm      LV E/e' medial:  11.4 LV IVS:        1.20 cm      LV e' lateral:   9.03 cm/s LVOT diam:     2.30 cm      LV E/e' lateral: 8.5 LV SV:         84 LV SV Index:   42 LVOT Area:     4.15 cm  LV Volumes (MOD) LV vol d, MOD A2C: 123.0 ml LV vol d, MOD A4C: 151.0 ml LV vol s, MOD A2C: 53.0 ml LV vol s, MOD A4C: 63.7 ml LV SV MOD A2C:     70.0 ml LV SV MOD A4C:     151.0 ml LV SV MOD BP:      77.8 ml RIGHT VENTRICLE             IVC RV Basal diam:  3.80 cm     IVC diam: 2.00 cm RV Mid diam:    3.50 cm RV S prime:     10.30 cm/s TAPSE (M-mode): 2.1 cm LEFT ATRIUM             Index        RIGHT ATRIUM           Index LA diam:        3.80 cm 1.88 cm/m    RA Area:     12.30 cm LA Vol (A2C):   67.2 ml 33.33 ml/m  RA Volume:   20.60 ml  10.22 ml/m LA Vol (A4C):   64.2 ml 31.84 ml/m LA Biplane Vol: 66.1 ml 32.78 ml/m  AORTIC VALVE AV Area (Vmax): 3.81 cm AV Vmax:        94.70 cm/s AV Peak Grad:   3.6 mmHg LVOT Vmax:      86.80 cm/s LVOT Vmean:     62.100 cm/s LVOT  VTI:       0.202 m AI PHT:         605 msec  AORTA Ao Root diam: 4.40 cm Ao Asc diam:  3.50 cm MITRAL VALVE               TRICUSPID VALVE MV Area (PHT): 5.02 cm    TR Peak grad:   25.0 mmHg MV Decel Time: 151 msec    TR Vmax:        250.00 cm/s MV E velocity: 77.10 cm/s MV A velocity: 56.60 cm/s  SHUNTS MV E/A ratio:  1.36        Systemic VTI:  0.20 m                            Systemic Diam: 2.30 cm Sanda Klein MD Electronically signed by Sanda Klein MD Signature Date/Time: 06/04/2021/12:56:21 PM    Final    ECHO TEE  Result Date: 06/10/2021    TRANSESOPHOGEAL ECHO REPORT   Patient Name:   Ryan Montgomery Date of Exam: 06/10/2021 Medical Rec #:  [id number]       Height:       71.0 in Accession #:    [id number]      Weight:       180.3 lb Date of Birth:  06-20-1955        BSA:          2.018 m Patient Age:    9 years        BP:           112/77 mmHg Patient Gender: M               HR:           80 bpm. Exam Location:  Inpatient Procedure: Transesophageal Echo, Cardiac Doppler, Color Doppler and [place]            Contrast Bubble Study Indications:     Stroke  History:         Patient has prior history of Echocardiogram examinations, most                  recent 06/04/2021. Risk Factors:Current Smoker.  Sonographer:     Clayton Lefort RDCS (AE) Referring Phys:  1993 RHONDA G BARRETT Diagnosing Phys: Eleonore Chiquito MD PROCEDURE: After discussion of the risks and benefits of a TEE, an informed consent was obtained from the patient. TEE procedure time was 16 minutes. The transesophogeal probe was passed without difficulty through the esophogus of the patient. Imaged were obtained with the patient in a left  lateral decubitus position. Local oropharyngeal anesthetic was provided with Cetacaine. Sedation performed by different physician. The patient was monitored while under deep sedation. Anesthestetic sedation was provided intravenously by Anesthesiology: 175mg  of Propofol. Image quality was excellent. The patient's vital signs; including heart rate, blood pressure, and oxygen saturation; remained stable throughout the procedure. The patient developed no complications during the procedure. IMPRESSIONS  1. Aortic dilatation noted. Aneurysm of the aortic root, measuring 45 mm. There is Moderate (Grade III) layered plaque involving the descending aorta and aortic arch.  2. Equivocal bubble study with trivial bubbles crossing the IAS. Findings possibly represent a trivial PFO. Would defer to transcranial dopplers for interatrial shunt. Agitated saline contrast bubble study was positive with shunting observed within 3-6 cardiac cycles suggestive of interatrial shunt. There is a small patent foramen ovale with predominantly right to left shunting  across the atrial septum.  3. No left atrial/left atrial appendage thrombus was detected. The LAA emptying velocity was 75 cm/s.  4. Left ventricular ejection fraction, by estimation, is 60 to 65%. The left ventricle has normal function.  5. Right ventricular systolic function is normal. The right ventricular size is normal.  6. The mitral valve is grossly normal. Trivial mitral valve regurgitation. No evidence of mitral stenosis.  7. The aortic valve is tricuspid. Aortic valve regurgitation is mild. No aortic stenosis is present. FINDINGS  Left Ventricle: Left ventricular ejection fraction, by estimation, is 60 to 65%. The left ventricle has normal function. The left ventricular internal cavity size was normal in size. Right Ventricle: The right ventricular size is normal. No increase in right ventricular wall thickness. Right ventricular systolic function is normal. Left Atrium:  Left atrial size was normal in size. No left atrial/left atrial appendage thrombus was detected. The LAA emptying velocity was 75 cm/s. Right Atrium: Right atrial size was normal in size. Pericardium: Trivial pericardial effusion is present. Mitral Valve: The mitral valve is grossly normal. Trivial mitral valve regurgitation. No evidence of mitral valve stenosis. Tricuspid Valve: The tricuspid valve is grossly normal. Tricuspid valve regurgitation is trivial. No evidence of tricuspid stenosis. Aortic Valve: The aortic valve is tricuspid. Aortic valve regurgitation is mild. No aortic stenosis is present. Pulmonic Valve: The pulmonic valve was grossly normal. Pulmonic valve regurgitation is trivial. No evidence of pulmonic stenosis. Aorta: Aortic dilatation noted. 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 There is moderate (Grade III) layered plaque involving the descending aorta and aortic arch. Venous: The left lower pulmonary vein, left upper pulmonary vein, right upper pulmonary vein and right lower pulmonary vein are normal. IAS/Shunts: There is redundancy of the interatrial septum. The atrial septum is grossly normal. Agitated saline contrast was given intravenously to evaluate for intracardiac shunting. Agitated saline contrast bubble study was positive with shunting observed within 3-6 cardiac cycles suggestive of interatrial shunt. A small patent foramen ovale is detected with predominantly right to left shunting across the atrial septum. Equivocal bubble study with trivial bubbles crossing the IAS. Findings possibly represent a trivial PFO. Would defer to transcranial dopplers for interatrial shunt.   AORTA Ao Root diam: 4.50 cm Ao Asc diam:  3.60 cm Eleonore Chiquito MD Electronically signed by Eleonore Chiquito MD Signature Date/Time: 06/10/2021/10:53:02 AM    Final    VAS [place] LOWER EXTREMITY VENOUS (DVT)  Result Date: 06/06/2021  Lower Venous DVT Study Patient Name:  Ryan Montgomery  Date  of Exam:   06/06/2021 Medical Rec #: [id number]        Accession #:    [id number] Date of Birth: Jun 01, 1955         Patient Gender: M Patient Age:   51 years Exam Location:  [facility] Procedure:      VAS [place] LOWER EXTREMITY VENOUS (DVT) Referring Phys: Cornelius Moras XU --------------------------------------------------------------------------------  Indications: Stroke.  Comparison Study: No prior study Performing Technologist: Sharion Dove RVS  Examination Guidelines: A complete evaluation includes B-mode imaging, spectral Doppler, color Doppler, and power Doppler as needed of all accessible portions of each vessel. Bilateral testing is considered an integral part of a complete examination. Limited examinations for reoccurring indications may be performed as noted. The reflux portion of the exam is performed with the patient in reverse Trendelenburg.  +---------+---------------+---------+-----------+----------+--------------+  RIGHT     Compressibility Phasicity Spontaneity Properties Thrombus Aging  +---------+---------------+---------+-----------+----------+--------------+  CFV  Full            Yes       Yes                                    +---------+---------------+---------+-----------+----------+--------------+  SFJ       Full                                                             +---------+---------------+---------+-----------+----------+--------------+  FV Prox   Full                                                             +---------+---------------+---------+-----------+----------+--------------+  FV Mid    Full                                                             +---------+---------------+---------+-----------+----------+--------------+  FV Distal Full                                                             +---------+---------------+---------+-----------+----------+--------------+  PFV       Full                                                              +---------+---------------+---------+-----------+----------+--------------+  POP       Full            Yes       Yes                                    +---------+---------------+---------+-----------+----------+--------------+  PTV       Full                                                             +---------+---------------+---------+-----------+----------+--------------+  PERO      Full                                                             +---------+---------------+---------+-----------+----------+--------------+   +---------+---------------+---------+-----------+----------+--------------+  LEFT      Compressibility Phasicity Spontaneity Properties Thrombus Aging  +---------+---------------+---------+-----------+----------+--------------+  CFV       Full            Yes       Yes                                    +---------+---------------+---------+-----------+----------+--------------+  SFJ       Full                                                             +---------+---------------+---------+-----------+----------+--------------+  FV Prox   Full                                                             +---------+---------------+---------+-----------+----------+--------------+  FV Mid    Full                                                             +---------+---------------+---------+-----------+----------+--------------+  FV Distal Full                                                             +---------+---------------+---------+-----------+----------+--------------+  PFV       Full                                                             +---------+---------------+---------+-----------+----------+--------------+  POP       Full            Yes       Yes                                    +---------+---------------+---------+-----------+----------+--------------+  PTV       Full                                                              +---------+---------------+---------+-----------+----------+--------------+  PERO      Full                                                             +---------+---------------+---------+-----------+----------+--------------+  Summary: BILATERAL: - No evidence of deep vein thrombosis seen in the lower extremities, bilaterally. -No evidence of popliteal cyst, bilaterally.   *See table(s) above for measurements and observations. Electronically signed by Orlie Pollen on 06/06/2021 at 3:56:46 PM.    Final     Subjective: No new complaints  Discharge Exam: Vitals:   06/13/21 2322 06/14/21 0312  BP: 112/73 117/81  Pulse: 80 74  Resp: 20 16  Temp: 98.4 F (36.9 C) 97.7 F (36.5 C)  SpO2: 100% 100%   General: Pt is alert, awake, not in acute distress Neuro: Stuttering speech stable, mild R hand weakness stable  Labs: BNP (last 3 results) No results for input(s): BNP in the last 8760 hours. Basic Metabolic Panel: Recent Labs  Lab 06/10/21 0153 06/12/21 0148  NA 136 137  K 4.1 3.7  CL 105 104  CO2 23 24  GLUCOSE 118* 149*  BUN 14 13  CREATININE 1.33* 1.17  CALCIUM 8.9 9.0   Liver Function Tests: No results for input(s): AST, ALT, ALKPHOS, BILITOT, PROT, ALBUMIN in the last 168 hours. No results for input(s): LIPASE, AMYLASE in the last 168 hours. No results for input(s): AMMONIA in the last 168 hours. CBC: Recent Labs  Lab 06/10/21 0153 06/12/21 0148  WBC 12.4* 7.9  NEUTROABS 6.4 2.4  HGB 12.7* 12.2*  HCT 38.3* 37.4*  MCV 99.2 99.2  PLT 140* 139*   Cardiac Enzymes: No results for input(s): CKTOTAL, CKMB, CKMBINDEX, TROPONINI in the last 168 hours. BNP: Invalid input(s): POCBNP CBG: No results for input(s): GLUCAP in the last 168 hours. D-Dimer No results for input(s): DDIMER in the last 72 hours. Hgb A1c No results for input(s): HGBA1C in the last 72 hours. Lipid Profile No results for input(s): CHOL, HDL, LDLCALC, TRIG, CHOLHDL, LDLDIRECT in the last 72  hours. Thyroid function studies No results for input(s): TSH, T4TOTAL, T3FREE, THYROIDAB in the last 72 hours.  Invalid input(s): FREET3 Anemia work up No results for input(s): VITAMINB12, FOLATE, FERRITIN, TIBC, IRON, RETICCTPCT in the last 72 hours. Urinalysis    Component Value Date/Time   COLORURINE YELLOW 06/04/2021 0030   APPEARANCEUR HAZY (A) 06/04/2021 0030   LABSPEC 1.029 06/04/2021 0030   PHURINE 5.0 06/04/2021 0030   GLUCOSEU NEGATIVE 06/04/2021 0030   HGBUR NEGATIVE 06/04/2021 0030   HGBUR negative 07/25/2008 0000   BILIRUBINUR NEGATIVE 06/04/2021 0030   BILIRUBINUR neg 07/18/2013 1230   KETONESUR NEGATIVE 06/04/2021 0030   PROTEINUR 30 (A) 06/04/2021 0030   UROBILINOGEN 0.2 07/18/2013 1230   UROBILINOGEN 0.2 07/25/2008 0000   NITRITE NEGATIVE 06/04/2021 0030   LEUKOCYTESUR NEGATIVE 06/04/2021 0030    Microbiology Recent Results (from the past 240 hour(s))  Resp Panel by RT-PCR (Flu A&B, Covid) Nasopharyngeal Swab     Status: None   Collection Time: 06/13/21 10:00 AM   Specimen: Nasopharyngeal Swab; Nasopharyngeal(NP) swabs in vial transport medium  Result Value Ref Range Status   SARS Coronavirus 2 by RT PCR NEGATIVE NEGATIVE Final    Comment: (NOTE) SARS-CoV-2 target nucleic acids are NOT DETECTED.  The SARS-CoV-2 RNA is generally detectable in upper respiratory specimens during the acute phase of infection. The lowest concentration of SARS-CoV-2 viral copies this assay can detect is 138 copies/mL. A negative result does not preclude SARS-Cov-2 infection and should not be used as the sole basis for treatment or other patient management decisions. A negative result may occur with  improper specimen collection/handling, submission of specimen other than nasopharyngeal swab, presence of viral  mutation(s) within the areas targeted by this assay, and inadequate number of viral copies(<138 copies/mL). A negative result must be combined with clinical  observations, patient history, and epidemiological information. The expected result is Negative.  Fact Sheet for Patients:  EntrepreneurPulse.com.au  Fact Sheet for Healthcare Providers:  IncredibleEmployment.be  This test is no t yet approved or cleared by the [place] FDA and  has been authorized for detection and/or diagnosis of SARS-CoV-2 by FDA under an Emergency Use Authorization (EUA). This EUA will remain  in effect (meaning this test can be used) for the duration of the COVID-19 declaration under Section 564(b)(1) of the Act, 21 U.S.C.section 360bbb-3(b)(1), unless the authorization is terminated  or revoked sooner.       Influenza A by PCR NEGATIVE NEGATIVE Final   Influenza B by PCR NEGATIVE NEGATIVE Final    Comment: (NOTE) The Xpert Xpress SARS-CoV-2/FLU/RSV plus assay is intended as an aid in the diagnosis of influenza from Nasopharyngeal swab specimens and should not be used as a sole basis for treatment. Nasal washings and aspirates are unacceptable for Xpert Xpress SARS-CoV-2/FLU/RSV testing.  Fact Sheet for Patients: EntrepreneurPulse.com.au  Fact Sheet for Healthcare Providers: IncredibleEmployment.be  This test is not yet approved or cleared by the [place] FDA and has been authorized for detection and/or diagnosis of SARS-CoV-2 by FDA under an Emergency Use Authorization (EUA). This EUA will remain in effect (meaning this test can be used) for the duration of the COVID-19 declaration under Section 564(b)(1) of the Act, 21 U.S.C. section 360bbb-3(b)(1), unless the authorization is terminated or revoked.  Performed at [facility], [place] [address], [place], [place] [address]     Time coordinating discharge: Approximately 40 minutes  Patrecia Pour, MD  [facility] 06/14/2021, 7:22 AM

## 2021-06-14 NOTE — Progress Notes (Signed)
Occupational Therapy Treatment Patient Details Name: Nakeem Trzcinski MRN: [id number] DOB: 1956/03/31 Today's Date: 06/14/2021   History of present illness Wisdom Chavira is a 66 y.o. male presenting with syncopal episode with jerking motions, urinary incontinence and LOC. THC in UDS. PMH is significant for treated hepatitis C and gout, h/o stroke, h/o alcohol abuse, L hip sx in 08/2019.   OT comments  Patient received sitting on EOB.  Offered to address pill box test but patient stated he did not have his glasses and was unable to read labels. Patient performed mobility in hallway with [facility] and supervision. Patient provided with level 2 theraband and instructed on BUE exercises.  Transportation arrived during visit and session ended.    Recommendations for follow up therapy are one component of a multi-disciplinary discharge planning process, led by the attending physician.  Recommendations may be updated based on patient status, additional functional criteria and insurance authorization.    Follow Up Recommendations  Skilled nursing-short term rehab (<3 hours/day)    Assistance Recommended at Discharge Frequent or constant Supervision/Assistance  Patient can return home with the following  Assist for transportation;Help with stairs or ramp for entrance;Assistance with cooking/housework;Direct supervision/assist for financial management;Direct supervision/assist for medications management   Equipment Recommendations  BSC/3in1    Recommendations for Other Services      Precautions / Restrictions Precautions Precautions: Fall Precaution Comments: Mildly impaired cognition Restrictions Weight Bearing Restrictions: No       Mobility Bed Mobility Overal bed mobility: Modified Independent             General bed mobility comments: sitting on EOB    Transfers Overall transfer level: Needs assistance Equipment used: Straight cane Transfers: Sit to/from Stand Sit to Stand:  Supervision           General transfer comment: performed functional mobility in hallway with [facility] and supervision     Balance Overall balance assessment: Modified Independent Sitting-balance support: Feet supported Sitting balance-Leahy Scale: Normal Sitting balance - Comments: sitting on EOB upon arrival   Standing balance support: Single extremity supported, During functional activity Standing balance-Leahy Scale: Fair Standing balance comment: stood with and without support of cane                           ADL either performed or assessed with clinical judgement   ADL                                              Extremity/Trunk Assessment Upper Extremity Assessment RUE Deficits / Details: pt able to use functionally for packaging and oral hygiene at the sink. Uses SPC in R hand RUE Coordination: decreased fine motor LUE Deficits / Details: grossly 4-/5, mild difficulty with tooth paste tube, however able to complete with slightly increased effort LUE Sensation: WNL LUE Coordination: decreased fine motor            Vision       Perception     Praxis      Cognition Arousal/Alertness: Awake/alert Behavior During Therapy: WFL for tasks assessed/performed Overall Cognitive Status: No family/caregiver present to determine baseline cognitive functioning                                 General Comments: aware  of discharge and location        Exercises Exercises: Other exercises Other Exercises Other Exercises: provided patient with level 2 theraband and instructed on BUE strengthening    Shoulder Instructions       General Comments      Pertinent Vitals/ Pain       Pain Assessment Pain Assessment: Faces Faces Pain Scale: No hurt  Home Living                                          Prior Functioning/Environment              Frequency  Min 2X/week        Progress Toward  Goals  OT Goals(current goals can now be found in the care plan section)  Progress towards OT goals: Progressing toward goals  Acute Rehab OT Goals Patient Stated Goal: go get rehab OT Goal Formulation: With patient Time For Goal Achievement: 06/19/21 Potential to Achieve Goals: Good ADL Goals Pt Will Perform Grooming: Independently;standing Pt Will Transfer to Toilet: with modified independence;ambulating Pt Will Perform Tub/Shower Transfer: with modified independence;ambulating Pt/caregiver will Perform Home Exercise Program: Increased strength;Both right and left upper extremity;With written HEP provided Additional ADL Goal #1: Pt will indep verbalize at least 3 fall prevention strategies to apply in the home setting  Plan Discharge plan remains appropriate;Frequency remains appropriate    Co-evaluation                 AM-PAC OT "6 Clicks" Daily Activity     Outcome Measure   Help from another person eating meals?: None Help from another person taking care of personal grooming?: A Little Help from another person toileting, which includes using toliet, bedpan, or urinal?: A Little Help from another person bathing (including washing, rinsing, drying)?: A Little Help from another person to put on and taking off regular upper body clothing?: None Help from another person to put on and taking off regular lower body clothing?: A Little 6 Click Score: 20    End of Session Equipment Utilized During Treatment: Other (comment) (cane)  OT Visit Diagnosis: Other abnormalities of gait and mobility (R26.89);History of falling (Z91.81);Muscle weakness (generalized) (M62.81)   Activity Tolerance Patient tolerated treatment well   Patient Left  (patient left with transport)   Nurse Communication Mobility status        Time: [id number] OT Time Calculation (min): 14 min  Charges: OT General Charges $OT Visit: 1 Visit OT Treatments $Therapeutic Activity: 8-22 mins  Lodema Hong, OTA Acute Rehabilitation Services  Pager [phone] Office [place] 06/14/2021, 2:51 PM

## 2021-06-14 NOTE — Plan of Care (Signed)
  Problem: Education: Goal: Knowledge of disease or condition will improve Outcome: Progressing Goal: Knowledge of secondary prevention will improve (SELECT ALL) Outcome: Progressing Goal: Knowledge of patient specific risk factors will improve (INDIVIDUALIZE FOR PATIENT) Outcome: Progressing   Problem: Ischemic Stroke/TIA Tissue Perfusion: Goal: Complications of ischemic stroke/TIA will be minimized Outcome: Progressing   

## 2021-06-20 ENCOUNTER — Ambulatory Visit: Payer: No Typology Code available for payment source

## 2021-06-24 ENCOUNTER — Ambulatory Visit (INDEPENDENT_AMBULATORY_CARE_PROVIDER_SITE_OTHER): Payer: Self-pay | Admitting: Podiatry

## 2021-06-24 DIAGNOSIS — Z91199 Patient's noncompliance with other medical treatment and regimen due to unspecified reason: Secondary | ICD-10-CM

## 2021-06-24 NOTE — Progress Notes (Signed)
No show

## 2021-06-25 ENCOUNTER — Ambulatory Visit: Payer: No Typology Code available for payment source | Admitting: Podiatry

## 2021-06-26 ENCOUNTER — Ambulatory Visit (INDEPENDENT_AMBULATORY_CARE_PROVIDER_SITE_OTHER): Payer: No Typology Code available for payment source

## 2021-06-26 ENCOUNTER — Other Ambulatory Visit: Payer: Self-pay

## 2021-06-26 DIAGNOSIS — I639 Cerebral infarction, unspecified: Secondary | ICD-10-CM

## 2021-06-26 LAB — CUP PACEART INCLINIC DEVICE CHECK
Date Time Interrogation Session: 20230201083756
Implantable Pulse Generator Implant Date: 20230117

## 2021-06-26 NOTE — Progress Notes (Signed)
ILR wound check in clinic. Steri strips removed. Wound well healed. Home monitor transmitting nightly. No episodes. Wound care discussed w/ verbal understanding. Questions answered.

## 2021-06-26 NOTE — Patient Instructions (Signed)
Please monitor for bleeding, redness, swelling, drainage, fever or chills. If you have any, call the device clinic [phone].

## 2021-07-15 ENCOUNTER — Ambulatory Visit (INDEPENDENT_AMBULATORY_CARE_PROVIDER_SITE_OTHER): Payer: No Typology Code available for payment source

## 2021-07-15 DIAGNOSIS — I639 Cerebral infarction, unspecified: Secondary | ICD-10-CM | POA: Diagnosis not present

## 2021-07-15 LAB — CUP PACEART REMOTE DEVICE CHECK
Date Time Interrogation Session: 20230219145800
Implantable Pulse Generator Implant Date: 20230117

## 2021-07-18 NOTE — Progress Notes (Signed)
Carelink Summary Report / Loop Recorder 

## 2021-08-19 ENCOUNTER — Ambulatory Visit (INDEPENDENT_AMBULATORY_CARE_PROVIDER_SITE_OTHER): Payer: No Typology Code available for payment source

## 2021-08-19 DIAGNOSIS — I639 Cerebral infarction, unspecified: Secondary | ICD-10-CM

## 2021-08-20 LAB — CUP PACEART REMOTE DEVICE CHECK
Date Time Interrogation Session: 20230326230518
Implantable Pulse Generator Implant Date: 20230117

## 2021-08-28 NOTE — Progress Notes (Signed)
Carelink Summary Report / Loop Recorder 

## 2021-09-21 LAB — CUP PACEART REMOTE DEVICE CHECK
Date Time Interrogation Session: 20230428230121
Implantable Pulse Generator Implant Date: 20230117

## 2021-09-23 ENCOUNTER — Ambulatory Visit (INDEPENDENT_AMBULATORY_CARE_PROVIDER_SITE_OTHER): Payer: No Typology Code available for payment source

## 2021-09-23 DIAGNOSIS — I639 Cerebral infarction, unspecified: Secondary | ICD-10-CM

## 2021-10-07 NOTE — Progress Notes (Signed)
Carelink Summary Report / Loop Recorder 

## 2022-09-15 ENCOUNTER — Ambulatory Visit (INDEPENDENT_AMBULATORY_CARE_PROVIDER_SITE_OTHER): Payer: Medicare Other | Admitting: Family Medicine

## 2022-09-15 ENCOUNTER — Encounter: Payer: Self-pay | Admitting: Family Medicine

## 2022-09-15 VITALS — BP 112/80 | HR 86 | Temp 98.5°F | Wt 204.0 lb

## 2022-09-15 DIAGNOSIS — M25552 Pain in left hip: Secondary | ICD-10-CM

## 2022-09-15 DIAGNOSIS — M79672 Pain in left foot: Secondary | ICD-10-CM

## 2022-09-15 DIAGNOSIS — Z0000 Encounter for general adult medical examination without abnormal findings: Secondary | ICD-10-CM

## 2022-09-15 DIAGNOSIS — G8929 Other chronic pain: Secondary | ICD-10-CM | POA: Diagnosis not present

## 2022-09-15 MED ORDER — CELECOXIB 200 MG PO CAPS: 200.0000 mg | ORAL_CAPSULE | Freq: Two times a day (BID) | ORAL | 5 refills | Status: DC | PRN

## 2022-09-15 NOTE — Progress Notes (Signed)
   Subjective:    Patient ID: Ryan Montgomery, male    DOB: Jul 06, 1955, 68 y.o.   MRN: [id number]  HPI Here for several issues. First he continues to have pain in the left foot, he has not seen podiatry for some time. 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 Nothing OTC helps, and he has had minimal benefit from taking Diclofenac. Lastly he is past due for a colonoscopy, and he asks [place] to arrange this. He has no GI complaints.    Review of Systems  Constitutional: Negative.   Respiratory: Negative.    Cardiovascular: Negative.   Gastrointestinal: Negative.   Musculoskeletal:  Positive for arthralgias.       Objective:   Physical Exam Constitutional:      Comments: Walks with a cane   Cardiovascular:     Rate and Rhythm: Normal rate and regular rhythm.     Pulses: Normal pulses.     Heart sounds: Normal heart sounds.  Pulmonary:     Effort: Pulmonary effort is normal.     Breath sounds: Normal breath sounds.  Neurological:     Mental Status: He is alert.           Assessment & Plan:  For the chronic hip pain, he will try Celebrex 200 mg BID as needed. For the foot issue, we will refer him back to Podiatry. We will also make a referral for the colonoscopy.  Gershon Crane, MD

## 2022-09-21 ENCOUNTER — Other Ambulatory Visit: Payer: Self-pay

## 2022-09-21 ENCOUNTER — Emergency Department (HOSPITAL_COMMUNITY): Payer: Medicare Other

## 2022-09-21 ENCOUNTER — Encounter (HOSPITAL_COMMUNITY): Payer: Self-pay | Admitting: *Deleted

## 2022-09-21 ENCOUNTER — Emergency Department (HOSPITAL_COMMUNITY)
Admission: EM | Admit: 2022-09-21 | Discharge: 2022-09-21 | Disposition: A | Discharge status: Home / Self Care | Payer: Medicare Other | Attending: Emergency Medicine | Admitting: Emergency Medicine

## 2022-09-21 DIAGNOSIS — Z743 Need for continuous supervision: Secondary | ICD-10-CM | POA: Diagnosis not present

## 2022-09-21 DIAGNOSIS — I499 Cardiac arrhythmia, unspecified: Secondary | ICD-10-CM | POA: Diagnosis not present

## 2022-09-21 DIAGNOSIS — R55 Syncope and collapse: Secondary | ICD-10-CM | POA: Diagnosis not present

## 2022-09-21 DIAGNOSIS — R6889 Other general symptoms and signs: Secondary | ICD-10-CM | POA: Diagnosis not present

## 2022-09-21 DIAGNOSIS — R404 Transient alteration of awareness: Secondary | ICD-10-CM | POA: Diagnosis not present

## 2022-09-21 LAB — CBC
HCT: 43.3 % (ref 39.0–52.0)
Hemoglobin: 14.2 g/dL (ref 13.0–17.0)
MCH: 31.7 pg (ref 26.0–34.0)
MCHC: 32.8 g/dL (ref 30.0–36.0)
MCV: 96.7 fL (ref 80.0–100.0)
Platelets: 196 10*3/uL (ref 150–400)
RBC: 4.48 MIL/uL (ref 4.22–5.81)
RDW: 13 % (ref 11.5–15.5)
WBC: 11.8 10*3/uL — ABNORMAL HIGH (ref 4.0–10.5)
nRBC: 0 % (ref 0.0–0.2)

## 2022-09-21 LAB — BASIC METABOLIC PANEL
Anion gap: 13 (ref 5–15)
BUN: 12 mg/dL (ref 8–23)
CO2: 19 mmol/L — ABNORMAL LOW (ref 22–32)
Calcium: 9.2 mg/dL (ref 8.9–10.3)
Chloride: 106 mmol/L (ref 98–111)
Creatinine, Ser: 1.43 mg/dL — ABNORMAL HIGH (ref 0.61–1.24)
GFR, Estimated: 54 mL/min — ABNORMAL LOW (ref >60)
Glucose, Bld: 127 mg/dL — ABNORMAL HIGH (ref 70–99)
Potassium: 3.6 mmol/L (ref 3.5–5.1)
Sodium: 138 mmol/L (ref 135–145)

## 2022-09-21 LAB — CBG MONITORING, ED: Glucose-Capillary: 129 mg/dL — ABNORMAL HIGH (ref 70–99)

## 2022-09-21 LAB — TROPONIN I (HIGH SENSITIVITY): Troponin I (High Sensitivity): 4 ng/L (ref <18)

## 2022-09-21 NOTE — ED Triage Notes (Signed)
Arrived with [facility] for syncope, witnessed by family. ~30second unresponive with possible seizure like activity, urinary incontinence. Pulse 106 Bp 104/48 96% RA CBG 104  After speaking with patient, he was with family at a cookout today, has been in the heat most of the day. Admits to etoh. Reports sitting in a chair talking to family then passed out. Family member put ice around pts groin to help cool him off. Pt alert and oriented, answering questions appropriately

## 2022-09-21 NOTE — ED Notes (Signed)
20 R AC IV removed. Catheter intact, bleeding controlled.

## 2022-09-21 NOTE — ED Notes (Signed)
Pt states he is unable to urinate due to prostate issues and he does not urinate frequently.

## 2022-09-21 NOTE — ED Notes (Signed)
Lab called to add on trop 

## 2022-09-21 NOTE — Discharge Instructions (Signed)
Evaluation for your passing out was overall reassuring.  Labs EKG chest x-ray with all within normal limits.  Recommend you follow-up with your PCP in the next week for reevaluation.  If you pass out again, have new chest pain, palpitations, shortness of breath or any other concerning symptom please return emergency department further evaluation.

## 2022-09-21 NOTE — ED Provider Notes (Signed)
[place] EMERGENCY DEPARTMENT AT [facility] Provider Note   CSN: [id number] Arrival date & time: 09/21/22  6578     History  No chief complaint on file.  HPI Ryan Montgomery is a 67 y.o. male with history of syncope, CVA, seizure disorder presenting for syncope.  States he was at a family barbecue today, took 2 puffs of "a marijuana joint", minutes later he "passed out.  Preceding the incident, states he felt somewhat lightheaded but denies dizziness, chest pain, shortness of breath or headache.  But this is informed him that he returned to consciousness after few seconds.  He was sitting down when this occurred and denies head injury or loss of consciousness.  States that he has passed out before after smoking marijuana on multiple occasions.  HPI     Home Medications Prior to Admission medications   Medication Sig Start Date End Date Taking? Authorizing Provider  amLODipine (NORVASC) 5 MG tablet Take 1 tablet (5 mg total) by mouth daily. 06/14/21   Tyrone Nine, MD  aspirin EC 81 MG EC tablet Take 1 tablet (81 mg total) by mouth daily. Swallow whole. 06/14/21   Tyrone Nine, MD  atorvastatin (LIPITOR) 20 MG tablet Take 1 tablet (20 mg total) by mouth daily. 06/14/21   Tyrone Nine, MD  celecoxib (CELEBREX) 200 MG capsule Take 1 capsule (200 mg total) by mouth 2 (two) times daily as needed for moderate pain. 09/15/22   Nelwyn Salisbury, MD  tamsulosin (FLOMAX) 0.4 MG CAPS capsule Take 1 capsule (0.4 mg total) by mouth daily after supper. 12/28/20   Melene Plan, DO      Allergies    Moxifloxacin    Review of Systems   See HPI for pertinent positives   Physical Exam   Vitals:   09/21/22 2100 09/21/22 2115  BP: 122/88 115/72  Pulse: 93 82  Resp: (!) 23 (!) 23  Temp:    SpO2: 94% 97%    CONSTITUTIONAL:  well-appearing, NAD NEURO: GCS 15. Speech is goal oriented. No deficits appreciated to CN III-XII; symmetric eyebrow raise, no facial drooping, tongue midline.  Patient has equal grip strength bilaterally with 5/5 strength against resistance in all major muscle groups bilaterally. Sensation to light touch intact. Patient moves extremities without ataxia. Normal finger-nose-finger. Patient ambulatory with steady gait. EYES:  eyes equal and reactive ENT/NECK:  Supple, no stridor, moist mucous membranes CARDIO: regular rate and rhythm, appears well-perfused  PULM:  No respiratory distress, CTAB GI/GU:  non-distended, soft MSK/SPINE:  No gross deformities, no edema, moves all extremities  SKIN:  no rash, atraumatic   *Additional and/or pertinent findings included in MDM below    ED Results / Procedures / Treatments   Labs (all labs ordered are listed, but only abnormal results are displayed) Labs Reviewed  BASIC METABOLIC PANEL - Abnormal; Notable for the following components:      Result Value   CO2 19 (*)    Glucose, Bld 127 (*)    Creatinine, Ser 1.43 (*)    GFR, Estimated 54 (*)    All other components within normal limits  CBC - Abnormal; Notable for the following components:   WBC 11.8 (*)    All other components within normal limits  CBG MONITORING, ED - Abnormal; Notable for the following components:   Glucose-Capillary 129 (*)    All other components within normal limits  URINALYSIS, ROUTINE W REFLEX MICROSCOPIC  TROPONIN I (HIGH SENSITIVITY)  TROPONIN I (  HIGH SENSITIVITY)    EKG EKG Interpretation  Date/Time:  Sunday September 21 2022 19:56:39 EDT Ventricular Rate:  91 PR Interval:  193 QRS Duration: 82 QT Interval:  344 QTC Calculation: 424 R Axis:   64 Text Interpretation: Sinus rhythm Confirmed by Alvester Chou [phone]) on 09/21/2022 8:41:02 PM  Radiology DG Chest 1 View  Result Date: 09/21/2022 CLINICAL DATA:  Syncope. EXAM: CHEST  1 VIEW COMPARISON:  June 03, 2021 FINDINGS: Cardiomediastinal silhouette is normal. Mediastinal contours appear intact. There is no evidence of focal airspace consolidation, pleural  effusion or pneumothorax. Osseous structures are without acute abnormality. Soft tissues are grossly normal. IMPRESSION: No active disease. Electronically Signed   By: Ted Mcalpine M.D.   On: 09/21/2022 21:20    Procedures Procedures    Medications Ordered in ED Medications - No data to display  ED Course/ Medical Decision Making/ A&P Clinical Course as of 09/21/22 2213  Sun Sep 21, 2022  174 67 year old male presenting to the ED with an episode of syncope versus near syncope, patient reports that he smoked marijuana today for the first time in a long time, and immediately after taking inhalations became lightheaded and then fell to the ground.  He says the same thing happened to him 3 years ago after he smoked marijuana.  He thinks that his sensitive to this drug.  He since then has awoken and feels completely back to normal.  No evidence of any type of significant head trauma.  He is asymptomatic in the ED.  His initial labs my review are unremarkable, EKG shows no acute ischemic findings or high-grade arrhythmia.  He is pending an initial troponin but anticipate could be discharged home afterwards if there are no emergent findings.  Low suspicion for acute PE, aortic dissection, stroke, or other life-threatening emergency based on his presentation. [MT]    Clinical Course User Index [MT] Trifan, Kermit Balo, MD                             Medical Decision Making Amount and/or Complexity of Data Reviewed Labs: ordered. Radiology: ordered.   Initial Impression and Ddx 67 year old male who is well-appearing presenting for syncope.  Exam is unremarkable.  DDx includes arrhythmia, ACS, metabolic derangement, cardiogenic syncope, vasovagal syncope, dehydration, and seizure Patient PMH that increases complexity of ED encounter:  syncope, CVA, seizure disorder  Interpretation of Diagnostics I independent reviewed and interpreted the labs as followed: Mild leukocytosis, slightly  elevated creatinine  - I independently visualized the following imaging with scope of interpretation limited to determining acute life threatening conditions related to emergency care: Chest x-ray, which revealed no acute findings  I personally reviewed and interpreted EKG which revealed normal sinus rhythm.  Patient Reassessment and Ultimate Disposition/Management Overall patient appears clinically well, syncopal workup was reassuring.  Also mention that he has had syncope with marijuana use in the past.  I have low suspicion that this is of cardiac etiology.  EKG was normal without any evidence of arrhythmia.  Labs did not reveal any acute derangement.  Suspect this is vasovagal versus situational syncope.  Advised patient to follow-up with his PCP.  Discussed pertinent return precautions.  Vital stable at discharge.  Patient management required discussion with the following services or consulting groups:  None  Complexity of Problems Addressed Acute complicated illness or Injury  Additional Data Reviewed and Analyzed Further history obtained from: Past medical history and medications listed in  the EMR and Prior ED visit notes  Patient Encounter Risk Assessment None         Final Clinical Impression(s) / ED Diagnoses Final diagnoses:  Syncope, unspecified syncope type    Rx / DC Orders ED Discharge Orders     None         Gareth Eagle, PA-C 09/21/22 2213    Terald Sleeper, MD 09/21/22 2320

## 2022-09-24 ENCOUNTER — Telehealth: Payer: Self-pay

## 2022-09-24 NOTE — Transitions of Care (Post Inpatient/ED Visit) (Signed)
   09/24/2022  Name: Ryan Montgomery MRN: [id number] DOB: 1956/04/12  Today's TOC FU Call Status: Today's TOC FU Call Status:: Successful TOC FU Call Competed TOC FU Call Complete Date: 09/24/22  Red on EMMI-ED Discharge Alert Date & Reason:09/23/22 "Scheduled follow-up appt? No"  Transition Care Management Follow-up Telephone Call Date of Discharge: 09/21/22 Discharge Facility: [facility]) Type of Discharge: Inpatient Admission Primary Inpatient Discharge Diagnosis:: "syncope,unspecified" How have you been since you were released from the hospital?: Better (Pt reports no further syncope episodes. He states he is doing well.) Any questions or concerns?: No  Items Reviewed: Did you receive and understand the discharge instructions provided?: Yes Medications obtained,verified, and reconciled?: Yes (Medications Reviewed) Any new allergies since your discharge?: No Dietary orders reviewed?: Yes Type of Diet Ordered:: low salt/heart healthy Do you have support at home?: Yes People in Home: spouse Name of Support/Comfort Primary Source: Gunnar Fusi  Medications Reviewed Today: Medications Reviewed Today     Reviewed by Charlyn Minerva, RN (Registered Nurse) on 09/24/22 at 1059  Med List Status: <None>   Medication Order Taking? Sig Documenting Provider Last Dose Status Informant  amLODipine (NORVASC) 5 MG tablet [id number] No Take 1 tablet (5 mg total) by mouth daily. Tyrone Nine, MD Taking Active   aspirin EC 81 MG EC tablet [id number] No Take 1 tablet (81 mg total) by mouth daily. Swallow whole. Tyrone Nine, MD Taking Active   atorvastatin (LIPITOR) 20 MG tablet [id number] No Take 1 tablet (20 mg total) by mouth daily. Tyrone Nine, MD Taking Active   celecoxib (CELEBREX) 200 MG capsule [id number]  Take 1 capsule (200 mg total) by mouth 2 (two) times daily as needed for moderate pain. Nelwyn Salisbury, MD  Active   tamsulosin [facility]) 0.4 MG CAPS capsule [id number] No Take 1  capsule (0.4 mg total) by mouth daily after supper. Melene Plan, DO Taking Active Spouse/Significant Other            Home Care and Equipment/Supplies: Were Home Health Services Ordered?: NA Any new equipment or medical supplies ordered?: NA  Functional Questionnaire: Do you need assistance with bathing/showering or dressing?: No Do you need assistance with meal preparation?: No Do you need assistance with eating?: No Do you have difficulty maintaining continence: No Do you need assistance with getting out of bed/getting out of a chair/moving?: No Do you have difficulty managing or taking your medications?: No  Follow up appointments reviewed: PCP Follow-up appointment confirmed?: NA Specialist Hospital Follow-up appointment confirmed?: NA (Pt states he was not advised to follow up with PCP and just saw MD on a few wks ago) Do you need transportation to your follow-up appointment?: No Do you understand care options if your condition(s) worsen?: Yes-patient verbalized understanding    Interventions Today    Flowsheet Row Most Recent Value  General Interventions   General Interventions Discussed/Reviewed General Interventions Discussed, Doctor Visits  Doctor Visits Discussed/Reviewed PCP, Doctor Visits Discussed  PCP/Specialist Visits Compliance with follow-up visit  Education Interventions   Education Provided Provided Education  Provided Verbal Education On When to see the doctor, Medication  Pharmacy Interventions   Pharmacy Dicussed/Reviewed Pharmacy Topics Discussed, Medications and their functions  Safety Interventions   Safety Discussed/Reviewed Safety Discussed       Alessandra Grout [facility] Care Management Care Management Community Coordinator Direct Phone: [phone] Toll Free: [phone] Fax: [phone]

## 2022-10-07 ENCOUNTER — Telehealth: Payer: Self-pay | Admitting: Family Medicine

## 2022-10-07 NOTE — Telephone Encounter (Signed)
Called patient to schedule Medicare Annual Wellness Visit (AWV). Left message for patient to call back and schedule Medicare Annual Wellness Visit (AWV).  Last date of AWV:  awvi 09/24/22 per [facility]     Please schedule an appointment at any time with [facility]  beverly or hannah kim.  If any questions, please contact me at [phone].  Thank you ,  Rudell Cobb AWV direct phone # [phone]

## 2022-10-27 ENCOUNTER — Ambulatory Visit (INDEPENDENT_AMBULATORY_CARE_PROVIDER_SITE_OTHER): Payer: Medicare Other | Admitting: Podiatry

## 2022-10-27 ENCOUNTER — Encounter: Payer: Self-pay | Admitting: Podiatry

## 2022-10-27 VITALS — Ht 71.0 in

## 2022-10-27 DIAGNOSIS — L309 Dermatitis, unspecified: Secondary | ICD-10-CM | POA: Diagnosis not present

## 2022-10-27 DIAGNOSIS — B351 Tinea unguium: Secondary | ICD-10-CM

## 2022-10-27 DIAGNOSIS — M79675 Pain in left toe(s): Secondary | ICD-10-CM | POA: Diagnosis not present

## 2022-10-27 DIAGNOSIS — M2042 Other hammer toe(s) (acquired), left foot: Secondary | ICD-10-CM

## 2022-10-27 DIAGNOSIS — M2041 Other hammer toe(s) (acquired), right foot: Secondary | ICD-10-CM | POA: Diagnosis not present

## 2022-10-27 DIAGNOSIS — M79674 Pain in right toe(s): Secondary | ICD-10-CM

## 2022-10-27 NOTE — Progress Notes (Signed)
Subjective:   Patient ID: Ryan Montgomery, male   DOB: 67 y.o.   MRN: [id number]   HPI Patient presents with chronic nail disease digital deformities and concerned about history of fungal infection and irritation between his toes.  Patient does smoke a half a pack of cigarettes today did have a stroke seems to be in reasonably good shape now and tries to be active   Review of Systems  All other systems reviewed and are negative.       Objective:  Physical Exam Vitals and nursing note reviewed.  Constitutional:      Appearance: He is well-developed.  Pulmonary:     Effort: Pulmonary effort is normal.  Musculoskeletal:        General: Normal range of motion.  Skin:    General: Skin is warm.  Neurological:     Mental Status: He is alert.     Neurovascular scar from includes mild diminishment of sharp dull vibratory and PT DP pulses with patient found to have thick deformed nailbeds 1-5 both feet and elongated which can become painful irritation between his digits but no active fungal infection identified with moderate digital deformities which may be due to other issues.  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

## 2022-12-13 ENCOUNTER — Encounter (HOSPITAL_COMMUNITY): Payer: Self-pay

## 2022-12-13 ENCOUNTER — Emergency Department (HOSPITAL_COMMUNITY)
Admission: EM | Admit: 2022-12-13 | Discharge: 2022-12-14 | Disposition: A | Discharge status: Home / Self Care | Payer: Medicare Other | Attending: Emergency Medicine | Admitting: Emergency Medicine

## 2022-12-13 ENCOUNTER — Other Ambulatory Visit: Payer: Self-pay

## 2022-12-13 DIAGNOSIS — R4182 Altered mental status, unspecified: Secondary | ICD-10-CM | POA: Insufficient documentation

## 2022-12-13 DIAGNOSIS — Z79899 Other long term (current) drug therapy: Secondary | ICD-10-CM | POA: Insufficient documentation

## 2022-12-13 DIAGNOSIS — R000 Tachycardia, unspecified: Secondary | ICD-10-CM | POA: Diagnosis not present

## 2022-12-13 DIAGNOSIS — I639 Cerebral infarction, unspecified: Secondary | ICD-10-CM | POA: Diagnosis not present

## 2022-12-13 DIAGNOSIS — F10229 Alcohol dependence with intoxication, unspecified: Secondary | ICD-10-CM | POA: Diagnosis present

## 2022-12-13 DIAGNOSIS — Y908 Blood alcohol level of 240 mg/100 ml or more: Secondary | ICD-10-CM | POA: Insufficient documentation

## 2022-12-13 DIAGNOSIS — F10920 Alcohol use, unspecified with intoxication, uncomplicated: Secondary | ICD-10-CM

## 2022-12-13 DIAGNOSIS — W19XXXA Unspecified fall, initial encounter: Secondary | ICD-10-CM | POA: Diagnosis not present

## 2022-12-13 DIAGNOSIS — Z7982 Long term (current) use of aspirin: Secondary | ICD-10-CM | POA: Insufficient documentation

## 2022-12-13 DIAGNOSIS — S0990XA Unspecified injury of head, initial encounter: Secondary | ICD-10-CM | POA: Diagnosis not present

## 2022-12-14 ENCOUNTER — Emergency Department (HOSPITAL_COMMUNITY): Payer: Medicare Other

## 2022-12-14 DIAGNOSIS — I639 Cerebral infarction, unspecified: Secondary | ICD-10-CM | POA: Diagnosis not present

## 2022-12-14 DIAGNOSIS — S0990XA Unspecified injury of head, initial encounter: Secondary | ICD-10-CM | POA: Diagnosis not present

## 2022-12-14 LAB — CBC
HCT: 36 % — ABNORMAL LOW (ref 39.0–52.0)
Hemoglobin: 11.4 g/dL — ABNORMAL LOW (ref 13.0–17.0)
MCH: 31.8 pg (ref 26.0–34.0)
MCHC: 31.7 g/dL (ref 30.0–36.0)
MCV: 100.3 fL — ABNORMAL HIGH (ref 80.0–100.0)
Platelets: 167 10*3/uL (ref 150–400)
RBC: 3.59 MIL/uL — ABNORMAL LOW (ref 4.22–5.81)
RDW: 13.2 % (ref 11.5–15.5)
WBC: 11.6 10*3/uL — ABNORMAL HIGH (ref 4.0–10.5)
nRBC: 0 % (ref 0.0–0.2)

## 2022-12-14 LAB — COMPREHENSIVE METABOLIC PANEL
ALT: 13 U/L (ref 0–44)
AST: 15 U/L (ref 15–41)
Albumin: 3.5 g/dL (ref 3.5–5.0)
Alkaline Phosphatase: 77 U/L (ref 38–126)
Anion gap: 10 (ref 5–15)
BUN: 10 mg/dL (ref 8–23)
CO2: 18 mmol/L — ABNORMAL LOW (ref 22–32)
Calcium: 8.8 mg/dL — ABNORMAL LOW (ref 8.9–10.3)
Chloride: 114 mmol/L — ABNORMAL HIGH (ref 98–111)
Creatinine, Ser: 1.31 mg/dL — ABNORMAL HIGH (ref 0.61–1.24)
GFR, Estimated: >60 mL/min (ref >60)
Glucose, Bld: 116 mg/dL — ABNORMAL HIGH (ref 70–99)
Potassium: 3.4 mmol/L — ABNORMAL LOW (ref 3.5–5.1)
Sodium: 142 mmol/L (ref 135–145)
Total Bilirubin: 0.5 mg/dL (ref 0.3–1.2)
Total Protein: 7.4 g/dL (ref 6.5–8.1)

## 2022-12-14 LAB — ACETAMINOPHEN LEVEL: Acetaminophen (Tylenol), Serum: <10 ug/mL — ABNORMAL LOW (ref 10–30)

## 2022-12-14 LAB — LIPASE, BLOOD: Lipase: 56 U/L — ABNORMAL HIGH (ref 11–51)

## 2022-12-14 LAB — SALICYLATE LEVEL: Salicylate Lvl: <7 mg/dL — ABNORMAL LOW (ref 7.0–30.0)

## 2022-12-14 LAB — ETHANOL: Alcohol, Ethyl (B): 319 mg/dL (ref <10)

## 2022-12-14 MED ORDER — SODIUM CHLORIDE 0.9 % IV BOLUS
1000.0000 mL | Freq: Once | INTRAVENOUS | Status: AC
  Administered 2022-12-14: 1000 mL via INTRAVENOUS

## 2022-12-14 MED ORDER — ONDANSETRON HCL 4 MG/2ML IJ SOLN
4.0000 mg | Freq: Once | INTRAMUSCULAR | Status: AC
  Administered 2022-12-14: 4 mg via INTRAVENOUS
  Filled 2022-12-14: qty 2

## 2022-12-14 NOTE — Discharge Instructions (Addendum)
Return to the ED for any concerning symptoms.  Follow-up with your PCP.

## 2022-12-14 NOTE — ED Notes (Signed)
Pt is awake and he states that he is ready to go home. Communicated this to provider

## 2022-12-14 NOTE — ED Notes (Signed)
Patient transported to CT 

## 2022-12-14 NOTE — ED Notes (Signed)
Patients Oxygen was low at 90%, placed patient on 2L [place] and improved to 95-96%. Notified MD as well.

## 2022-12-14 NOTE — ED Notes (Signed)
Patient is currently sleeping with bed tilted back some, no needs expressed at this time. Oxygen is still in place at this time.

## 2022-12-14 NOTE — ED Provider Notes (Signed)
Signout received on this 67 year old male.  He is awaiting metabolization to freedom. Physical Exam  BP 109/71   Pulse 86   Temp 97.8 F (36.6 C) (Oral)   Resp (!) 21   SpO2 97%     Procedures  Procedures  ED Course / MDM    Medical Decision Making Amount and/or Complexity of Data Reviewed Labs: ordered. Radiology: ordered.  Risk Prescription drug management.   Patient on reevaluation awake, alert and oriented.  Ambulates without any difficulty.  He is appropriate for discharge.  He is concerned that he lost his phone in transport.       Marita Kansas, PA-C 12/14/22 1610    Rexford Maus, DO 12/14/22 1555

## 2022-12-14 NOTE — ED Notes (Signed)
Patient is trying to call someone for a ride.

## 2022-12-14 NOTE — ED Provider Notes (Signed)
[place] EMERGENCY DEPARTMENT AT [facility] Provider Note   CSN: [id number] Arrival date & time: 12/13/22  2326     History  Chief Complaint  Patient presents with   Alcohol Intoxication    Pt bib ems from a family event where bystanders state pt fell to the ground, unknown if pt hit his head. Large amount of ETOH on board with possible marijuana. Pt is alert and responsive.VSS    Ryan Montgomery is a 67 y.o. male.  Patient brought in by EMS due to presumed alcohol intoxication and possible marijuana consumption.  Patient was at a family event where he reportedly fell to the ground.  It was unclear as to whether or not the patient hit his head.  Patient was alert and responsive but argumentative upon arrival at the emergency department.  He voiced no complaints.  Lab work for altered mental status/intoxication ordered.  The patient was noted to be hypotensive and fluids were started.  Past medical history significant for CVA, hepatitis C, seizure disorder  HPI     Home Medications Prior to Admission medications   Medication Sig Start Date End Date Taking? Authorizing Provider  amLODipine (NORVASC) 5 MG tablet Take 1 tablet (5 mg total) by mouth daily. 06/14/21   Tyrone Nine, MD  aspirin EC 81 MG EC tablet Take 1 tablet (81 mg total) by mouth daily. Swallow whole. 06/14/21   Tyrone Nine, MD  atorvastatin (LIPITOR) 20 MG tablet Take 1 tablet (20 mg total) by mouth daily. 06/14/21   Tyrone Nine, MD  celecoxib (CELEBREX) 200 MG capsule Take 1 capsule (200 mg total) by mouth 2 (two) times daily as needed for moderate pain. 09/15/22   Nelwyn Salisbury, MD  tamsulosin (FLOMAX) 0.4 MG CAPS capsule Take 1 capsule (0.4 mg total) by mouth daily after supper. 12/28/20   Melene Plan, DO      Allergies    Moxifloxacin    Review of Systems   Review of Systems  Physical Exam Updated Vital Signs BP 111/71   Pulse 74   Temp (!) 97.5 F (36.4 C) (Axillary)   Resp 14   SpO2 97%   Physical Exam Vitals and nursing note reviewed.  Constitutional:      General: He is not in acute distress.    Appearance: He is well-developed.  HENT:     Head: Normocephalic and atraumatic.  Eyes:     Conjunctiva/sclera: Conjunctivae normal.  Cardiovascular:     Rate and Rhythm: Normal rate and regular rhythm.     Heart sounds: No murmur heard. Pulmonary:     Effort: Pulmonary effort is normal. No respiratory distress.     Breath sounds: Normal breath sounds.  Abdominal:     Palpations: Abdomen is soft.     Tenderness: There is no abdominal tenderness.  Musculoskeletal:        General: No swelling.     Cervical back: Neck supple.  Skin:    General: Skin is warm and dry.     Capillary Refill: Capillary refill takes less than 2 seconds.  Neurological:     Mental Status: He is alert.     Comments: Patient not cooperating with neurologic exam at this time.  Patient's pupils are equal and reactive to light.  Patient actively moving with no apparent unilateral weakness, no facial droop noted.  Psychiatric:        Mood and Affect: Mood normal.     ED Results /  Procedures / Treatments   Labs (all labs ordered are listed, but only abnormal results are displayed) Labs Reviewed  COMPREHENSIVE METABOLIC PANEL - Abnormal; Notable for the following components:      Result Value   Potassium 3.4 (*)    Chloride 114 (*)    CO2 18 (*)    Glucose, Bld 116 (*)    Creatinine, Ser 1.31 (*)    Calcium 8.8 (*)    All other components within normal limits  ETHANOL - Abnormal; Notable for the following components:   Alcohol, Ethyl (B) 319 (*)    All other components within normal limits  LIPASE, BLOOD - Abnormal; Notable for the following components:   Lipase 56 (*)    All other components within normal limits  CBC - Abnormal; Notable for the following components:   WBC 11.6 (*)    RBC 3.59 (*)    Hemoglobin 11.4 (*)    HCT 36.0 (*)    MCV 100.3 (*)    All other components within  normal limits  ACETAMINOPHEN LEVEL - Abnormal; Notable for the following components:   Acetaminophen (Tylenol), Serum <10 (*)    All other components within normal limits  SALICYLATE LEVEL - Abnormal; Notable for the following components:   Salicylate Lvl <7.0 (*)    All other components within normal limits  RAPID URINE DRUG SCREEN, HOSP PERFORMED    EKG EKG Interpretation Date/Time:  Saturday December 13 2022 23:37:12 EDT Ventricular Rate:  101 PR Interval:  197 QRS Duration:  89 QT Interval:  341 QTC Calculation: 442 R Axis:   20  Text Interpretation: Sinus tachycardia Low voltage, precordial leads Borderline T abnormalities, anterior leads When compared with ECG of 09/21/2022, No significant change was found Confirmed by Dione Booze ([id number]) on 12/14/2022 12:13:59 AM  Radiology CT Head Wo Contrast  Result Date: 12/14/2022 CLINICAL DATA:  Head trauma, minor (Age >= 65y) EXAM: CT HEAD WITHOUT CONTRAST TECHNIQUE: Contiguous axial images were obtained from the base of the skull through the vertex without intravenous contrast. RADIATION DOSE REDUCTION: This exam was performed according to the departmental dose-optimization program which includes automated exposure control, adjustment of the mA and/or kV according to patient size and/or use of iterative reconstruction technique. COMPARISON:  06/03/2021 FINDINGS: Brain: There is atrophy and chronic small vessel disease changes. Old right frontal infarct, stable. Chronic appearing posterior left parietal infarct, new since prior study. No hemorrhage or hydrocephalus. Vascular: No hyperdense vessel or unexpected calcification. Skull: No acute calvarial abnormality. Sinuses/Orbits: No acute findings Other: None IMPRESSION: Stable old right frontal infarct. Posterior left parietal infarct is new since prior study but appears chronic. Atrophy, chronic microvascular disease. No acute intracranial abnormality. Electronically Signed   By: Charlett Nose M.D.    On: 12/14/2022 03:02    Procedures Procedures    Medications Ordered in ED Medications  sodium chloride 0.9 % bolus 1,000 mL (0 mLs Intravenous Stopped 12/14/22 0237)  ondansetron (ZOFRAN) injection 4 mg (4 mg Intravenous Given 12/14/22 0147)  sodium chloride 0.9 % bolus 1,000 mL (0 mLs Intravenous Stopped 12/14/22 0501)  sodium chloride 0.9 % bolus 1,000 mL (1,000 mLs Intravenous New Bag/Given 12/14/22 0426)    ED Course/ Medical Decision Making/ A&P                             Medical Decision Making Amount and/or Complexity of Data Reviewed Labs: ordered. Radiology: ordered.  Risk Prescription drug  management.   This patient presents to the ED for concern of altered mental status, this involves an extensive number of treatment options, and is a complaint that carries with it a high risk of complications and morbidity.  The differential diagnosis includes intoxication, substance abuse, CVA, metabolic abnormality, others   Co morbidities that complicate the patient evaluation  Hx CVA, seizure disorder   Additional history obtained:  Additional history obtained from EMS, family   Lab Tests:  I Ordered, and personally interpreted labs.  The pertinent results include: Ethanol 319, lipase mildly elevated at 56, potassium 3.4, creatinine 1.31 appears to be at baseline   Imaging Studies ordered:  I ordered imaging studies including CT head without contrast I independently visualized and interpreted imaging which showed  Stable old right frontal infarct. Posterior left parietal infarct is  new since prior study but appears chronic.    Atrophy, chronic microvascular disease.    No acute intracranial abnormality.   I agree with the radiologist interpretation   Cardiac Monitoring: / EKG:  The patient was maintained on a cardiac monitor.  I personally viewed and interpreted the cardiac monitored which showed an underlying rhythm of: Sinus tachycardia   Problem List /  ED Course / Critical interventions / Medication management   I ordered medication including normal saline bolus for fluid resuscitation, Zofran for nausea Reevaluation of the patient after these medicines showed that the patient improved I have reviewed the patients home medicines and have made adjustments as needed   Social Determinants of Health:  [facility]   Test / Admission - Considered:  Patient appears to be acutely intoxicated due to alcohol consumption. CT scan shows no acute findings. Plan to allow patient to metabolize until he is able to tolerate oral intake and ambulate safely. Patient care being transferred to [facility] PA-C at shift handoff. Disposition pending reassessment.          Final Clinical Impression(s) / ED Diagnoses Final diagnoses:  Acute alcoholic intoxication without complication [facility])    Rx / DC Orders ED Discharge Orders     None         Pamala Duffel 12/14/22 2956    Dione Booze, MD 12/14/22 [phone]

## 2022-12-15 ENCOUNTER — Telehealth: Payer: Self-pay | Admitting: Family Medicine

## 2022-12-15 ENCOUNTER — Telehealth: Payer: Self-pay

## 2022-12-15 NOTE — Telephone Encounter (Signed)
Transition Care Management Unsuccessful Follow-up Telephone Call  Date of discharge and from where:  [facility] 7/[facility]  Attempts:  1st Attempt  Reason for unsuccessful TCM follow-up call:  No answer/busy   Lenard Forth [facility] Guide, [facility] [phone] [address] [place], [place], [place] [address] Phone: [phone] Email: [email or web address]

## 2022-12-15 NOTE — Telephone Encounter (Signed)
Has questions regarding patient

## 2022-12-16 ENCOUNTER — Telehealth: Payer: Self-pay

## 2022-12-16 NOTE — Telephone Encounter (Signed)
Transition Care Management Unsuccessful Follow-up Telephone Call  Date of discharge and from where:  [facility]  Attempts:  2nd Attempt  Reason for unsuccessful TCM follow-up call:  No answer/busy   Lenard Forth [facility] Guide, [facility] [phone] [address] [place], [place], [place] [address] Phone: [phone] Email: [email or web address]

## 2022-12-18 NOTE — Telephone Encounter (Signed)
Returned the call for Ryan Montgomery, left a detailed message advised to call the office back regarding this message

## 2022-12-29 ENCOUNTER — Telehealth: Payer: Self-pay | Admitting: Family Medicine

## 2022-12-29 NOTE — Telephone Encounter (Signed)
Duplicate message. 

## 2022-12-29 NOTE — Telephone Encounter (Signed)
Ryan Montgomery with [place] [employer] has faxed over a request for medical records also Ryan Montgomery would like dr fry to return her call

## 2022-12-29 NOTE — Telephone Encounter (Addendum)
Please return Ryan Montgomery. Marylene Land did fax over medical record release last week

## 2022-12-30 ENCOUNTER — Telehealth (INDEPENDENT_AMBULATORY_CARE_PROVIDER_SITE_OTHER): Payer: Medicare Other | Admitting: Family Medicine

## 2022-12-30 DIAGNOSIS — Z0000 Encounter for general adult medical examination without abnormal findings: Secondary | ICD-10-CM | POA: Diagnosis not present

## 2022-12-30 DIAGNOSIS — Z1211 Encounter for screening for malignant neoplasm of colon: Secondary | ICD-10-CM

## 2022-12-30 NOTE — Patient Instructions (Addendum)
I really enjoyed getting to talk with you today! I am available on Tuesdays and Thursdays for virtual visits if you have any questions or concerns, or if I can be of any further assistance.   CHECKLIST FROM ANNUAL WELLNESS VISIT:  -Follow up (please call to schedule if not scheduled after visit):   -yearly for annual wellness visit with primary care office  Here is a list of your preventive care/health maintenance measures and the plan for each if any are due:  PLAN For any measures below that may be due:  -[facility] -we sent referral for colonosocpy today - call Dr. Claris Che office in 1-2 weeks if you have not been contacted for the appointment -consider the shingles vaccine -updated covid and flu shots are available each year in the fall  Health Maintenance  Topic Date Due   Pneumonia Vaccine 21+ Years old (1 of 2 - PCV) Never done   DTaP/Tdap/Td (1 - Tdap) Never done   Zoster Vaccines- Shingrix (1 of 2) Never done   Colonoscopy  08/29/2018   Lung Cancer Screening  09/09/2021   COVID-19 Vaccine (3 - 2023-24 season) 01/24/2022   INFLUENZA VACCINE  12/25/2022   Medicare Annual Wellness (AWV)  12/30/2023   Hepatitis C Screening  Completed   HPV VACCINES  Aged Out    -See a dentist at least yearly  -Get your eyes checked and then per your eye specialist's recommendations  -Other issues addressed today:  -please quit smoking. If you need help please le tus know! Also you can call 1-800-QUITNOW  -I have included below further information regarding a healthy whole foods based diet, physical activity guidelines for adults, stress management and opportunities for social connections. I hope you find this information useful.    -----------------------------------------------------------------------------------------------------------------------------------------------------------------------------------------------------------------------------------------------------------  NUTRITION: -eat real food: lots of colorful vegetables (half the plate) and fruits -5-7 servings of vegetables and fruits per day (fresh or steamed is best), exp. 2 servings of vegetables with lunch and dinner and 2 servings of fruit per day. Berries and greens such as kale and collards are great choices.  -consume on a regular basis: whole grains (make sure first ingredient on label contains the word "whole"), fresh fruits, fish, nuts, seeds, healthy oils (such as olive oil, avocado oil, grape seed oil) -may eat small amounts of dairy and lean meat on occasion, but avoid processed meats such as ham, bacon, lunch meat, etc. -drink water -try to avoid fast food and pre-packaged foods, processed meat -most experts advise limiting sodium to < 2300mg  per day, should limit further is any chronic conditions such as high blood pressure, heart disease, diabetes, etc. The American Heart Association advised that < 1500mg  is is ideal -try to avoid foods that contain any ingredients with names you do not recognize  -try to avoid sugar/sweets (except for the natural sugar that occurs in fresh fruit) -try to avoid sweet drinks -try to avoid white rice, white bread, pasta (unless whole grain), white or yellow potatoes  EXERCISE GUIDELINES FOR ADULTS: -if you wish to increase your physical activity, do so gradually and with the approval of your doctor -STOP and seek medical care immediately if you have any chest pain, chest discomfort or trouble breathing when starting or increasing exercise  -move and stretch your body, legs, feet and arms when sitting for long periods -Physical activity guidelines for optimal health in adults: -least 150 minutes per week of  aerobic exercise (can talk, but not  sing) once approved by your doctor, 20-30 minutes of sustained activity or two 10 minute episodes of sustained activity every day.  -resistance training at least 2 days per week if approved by your doctor -balance exercises 3+ days per week:   Stand somewhere where you have something sturdy to hold onto if you lose balance.    1) lift up on toes, start with 5x per day and work up to 20x   2) stand and lift on leg straight out to the side so that foot is a few inches of the floor, start with 5x each side and work up to 20x each side   3) stand on one foot, start with 5 seconds each side and work up to 20 seconds on each side  If you need ideas or help with getting more active:  -Silver sneakers https://tools.silversneakers.com  -Walk with a Doc: http://www.duncan-williams.com/  -try to include resistance (weight lifting/strength building) and balance exercises twice per week: or the following link for ideas: http://castillo-powell.com/  BuyDucts.dk  STRESS MANAGEMENT: -can try meditating, or just sitting quietly with deep breathing while intentionally relaxing all parts of your body for 5 minutes daily -if you need further help with stress, anxiety or depression please follow up with your primary doctor or contact the wonderful folks at [facility]: [phone]  SOCIAL CONNECTIONS: -options in Black if you wish to engage in more social and exercise related activities:  -Silver sneakers https://tools.silversneakers.com  -Walk with a Doc: http://www.duncan-williams.com/  -Check out the [facility] Active Adults 50+ section on the [place] [employer] (hiking clubs, book clubs, cards and games, chess, exercise classes, aquatic classes and much more) - see the website for  details: https://www.[place].gov/departments/parks-recreation/active-adults50  -YouTube has lots of exercise videos for different ages and abilities as well  -[facility] (a variety of indoor and outdoor inperson activities for adults). ([phone]. [address]  -Virtual Online Classes (a variety of topics): see seniorplanet.org or call [phone]  -consider volunteering at a school, hospice center, church, senior center or elsewhere

## 2022-12-30 NOTE — Progress Notes (Signed)
PATIENT CHECK-IN and HEALTH RISK ASSESSMENT QUESTIONNAIRE:  -completed by phone/video for upcoming Medicare Preventive Visit  Pre-Visit Check-in: 1)Vitals (height, wt, BP, etc) - record in vitals section for visit on day of visit Request home vitals (wt, BP, etc.) and enter into vitals, THEN update Vital Signs SmartPhrase below at the top of the HPI. See below.  2)Review and Update Medications, Allergies PMH, Surgeries, Social history in Epic 3)Hospitalizations in the last year with date/reason? No   4)Review and Update Care Team (patient's specialists) in Epic 5) Complete PHQ9 in Epic  6) Complete Fall Screening in Epic 7)Review all Health Maintenance Due and order under PCP if not done.  Medicare Wellness Patient Questionnaire:  Answer theses question about your habits: Do you drink alcohol? No  If yes, how many drinks do you have a day? N/a Have you ever smoked? Yes  Quit date if applicable? N/a  How many packs a day do/did you smoke? 4-5 cigarettes daily, in the past ppd for over 20 years Do you use smokeless tobacco? No  Do you use an illicit drugs?no  Do you exercises? Yes IF so, what type and how many days/minutes per week?walking - to mailbox and laundromat - 30 minute per minute, [place] [place] but now he has health problems Are you sexually active? No Number of partners? N/a Typical breakfast: anything he can  fix  Typical lunch: same, sand which  Typical dinner: same, home made chicken Typical snacks: chips and crackers   Beverages:  fruit punch and water   Answer theses question about you: Can you perform most household chores?yes  Do you find it hard to follow a conversation in a noisy room?no  Do you often ask people to speak up or repeat themselves? States he lives at o[place] alone Do you feel that you have a problem with memory? A little - but feels is pretty normal for his age, occ forgets things, writes things down so wont forget, unchanged his whole  life Do you balance your checkbook and or bank acounts?yes  Do you feel safe at home? yes Last dentist visit? In the 90s Do you need assistance with any of the following: Please note if so no, but has no transportation   Driving?  Feeding yourself?  Getting from bed to chair?  Getting to the toilet?  Bathing or showering?  Dressing yourself?  Managing money?  Climbing a flight of stairs  Preparing meals?    Do you have Advanced Directives in place (Living Will, Healthcare Power or Attorney)? Yes    Last eye Exam and location? Last year    Do you currently use prescribed or non-prescribed narcotic or opioid pain medications?no   Do you have a history or close family history of breast, ovarian, tubal or peritoneal cancer or a family member with BRCA (breast cancer susceptibility 1 and 2) gene mutations? With family   Was not able to do vitals due to televisit, pt did not have equipment. Request home vitals (wt, BP, etc.) and enter into vitals, THEN update Vital Signs SmartPhrase below at the top of the HPI. See below.     ----------------------------------------------------------------------------------------------------------------------------------------------------------------------------------------------------------------------  Vital Signs: Unable to obtain new vitals due to this being a telehealth visit.   MEDICARE ANNUAL PREVENTIVE CARE VISIT WITH PROVIDER (Welcome to Medicare, initial annual wellness or annual wellness exam)  Virtual Visit via Phone Note  I connected with Ryan Montgomery on 12/30/22  by phone and verified that  I am speaking with the correct person using two identifiers.  Location patient: home Location provider:work or home office Persons participating in the virtual visit: patient, provider  Concerns and/or follow up today: denies any concerns   See HM section in Epic for other details of completed HM.    ROS: negative for report of fevers,  unintentional weight loss, vision changes, vision loss, hearing loss or change, chest pain, sob, hemoptysis, melena, hematochezia, hematuria, falls, bleeding or bruising, thoughts of suicide or self harm, memory loss  Patient-completed extensive health risk assessment - reviewed and discussed with the patient: See Health Risk Assessment completed with patient prior to the visit either above or in recent phone note. This was reviewed in detailed with the patient today and appropriate recommendations, orders and referrals were placed as needed per Summary below and patient instructions.   Review of Medical History: -PMH, PSH, Family History and current specialty and care providers reviewed and updated and listed below   Patient Care Team: Nelwyn Salisbury, MD as PCP - General   Past Medical History:  Diagnosis Date   Chickenpox    Chickenpox    Heat stroke    Hepatitis C    Stroke [facility])     Past Surgical History:  Procedure Laterality Date   BUBBLE STUDY  06/10/2021   Procedure: BUBBLE STUDY;  Surgeon: Sande Rives, MD;  Location: [facility] ENDOSCOPY;  Service: Cardiovascular;;   colonoscopy  08-28-08   per Dr. Jarold Motto, clear, repeat in 10 yrs    fx rt ankle     pins placed   JOINT REPLACEMENT Left 09/12/2019   LOOP RECORDER INSERTION N/A 06/10/2021   Procedure: LOOP RECORDER INSERTION;  Surgeon: Regan Lemming, MD;  Location: [facility] INVASIVE CV LAB;  Service: Cardiovascular;  Laterality: N/A;   TEE WITHOUT CARDIOVERSION N/A 06/10/2021   Procedure: TRANSESOPHAGEAL ECHOCARDIOGRAM (TEE);  Surgeon: Sande Rives, MD;  Location: [facility] ENDOSCOPY;  Service: Cardiovascular;  Laterality: N/A;   TOTAL HIP ARTHROPLASTY Left 09/12/2019   per Dr. Josefine [facility] at [facility]     Social History   Socioeconomic History   Marital status: Married    Spouse name: Not on file   Number of children: Not on file   Years of education: Not on file   Highest education level: Not on file   Occupational History   Not on file  Tobacco Use   Smoking status: Every Day    Current packs/day: 0.50    Types: Cigarettes   Smokeless tobacco: Never  Vaping Use   Vaping status: Never Used  Substance and Sexual Activity   Alcohol use: Yes   Drug use: No   Sexual activity: Not on file  Other Topics Concern   Not on file  Social History Narrative   Right handed   Lives with wife one story home   Social Determinants of Health   Financial Resource Strain: Not on file  Food Insecurity: Not on file  Transportation Needs: Not on file  Physical Activity: Not on file  Stress: Not on file  Social Connections: Not on file  Intimate Partner Violence: Not on file    Family History  Problem Relation Age of Onset   Hypertension Other    Lung cancer Other     Current Outpatient Medications on File Prior to Visit  Medication Sig Dispense Refill   amLODipine (NORVASC) 5 MG tablet Take 1 tablet (5 mg total) by mouth daily.     aspirin  EC 81 MG EC tablet Take 1 tablet (81 mg total) by mouth daily. Swallow whole.     atorvastatin (LIPITOR) 20 MG tablet Take 1 tablet (20 mg total) by mouth daily.     celecoxib (CELEBREX) 200 MG capsule Take 1 capsule (200 mg total) by mouth 2 (two) times daily as needed for moderate pain. 60 capsule 5   tamsulosin (FLOMAX) 0.4 MG CAPS capsule Take 1 capsule (0.4 mg total) by mouth daily after supper. 30 capsule 0   No current facility-administered medications on file prior to visit.    Allergies  Allergen Reactions   Moxifloxacin Hives       Physical Exam Vitals requested from patient and listed below if patient had equipment and was able to obtain at home for this virtual visit: There were no vitals filed for this visit. Estimated body mass index is 29.29 kg/m as calculated from the following:   Height as of 10/27/22: 5\' 11"  (1.803 m).   Weight as of 09/21/22: 210 lb (95.3 kg).  EKG (optional): deferred due to virtual visit  GENERAL:  alert, oriented, no acute distress detected; full vision exam deferred due to pandemic and/or virtual encounter  PSYCH/NEURO: pleasant and cooperative, no obvious depression or anxiety, speech and thought processing grossly intact, Cognitive function grossly intact  Flowsheet Row Office Visit from 05/14/2021 in [facility] [place]  PHQ-9 Total Score 0           12/30/2022   10:26 AM 05/14/2021   11:58 AM 02/03/2018   11:16 AM  Depression screen PHQ 2/9  Decreased Interest 0 0 0  Down, Depressed, Hopeless 0 0 0  PHQ - 2 Score 0 0 0  Altered sleeping  0   Tired, decreased energy  0   Change in appetite  0   Feeling bad or failure about yourself   0   Trouble concentrating  0   Moving slowly or fidgety/restless  0   Suicidal thoughts  0   PHQ-9 Score  0   Difficult doing work/chores  Not difficult at all        06/12/2021    8:00 PM 06/13/2021    8:00 AM 06/13/2021    8:00 PM 06/14/2021   10:00 AM 12/30/2022   10:26 AM  Fall Risk  Falls in the past year?     0  Was there an injury with Fall?     0  Fall Risk Category Calculator     0  (RETIRED) Patient Fall Risk Level High fall risk High fall risk High fall risk High fall risk   Patient at Risk for Falls Due to     Other (Comment)  Fall risk Follow up     Falls evaluation completed   Tripped on a rug. Reports walks with a cane.   SUMMARY AND PLAN:  Encounter for Medicare annual wellness exam  Colon cancer screening - Plan: Ambulatory referral to Gastroenterology    Discussed applicable health maintenance/preventive health measures and advised and referred or ordered per patient preferences: -he did want to re-order colonoscopy - orders placed, advised to check with PCP in 1-2 weeks if not contacted for the appt -reports gets lung cancer screening at the [place], but over 1 year, he says [facility] will [facility] -reports gets vaccines at the [place] or pharmacy and reports has had the tetanus  boosters and the pneumonia vaccines, reports also gets the updated covid and flu  vaccines -considering shingrix and advised to do at the pharmacy if wises to do Health Maintenance  Topic Date Due   Pneumonia Vaccine 70+ Years old (1 of 2 - PCV) Never done   DTaP/Tdap/Td (1 - Tdap) Never done   Zoster Vaccines- Shingrix (1 of 2) Never done   Colonoscopy  08/29/2018   Lung Cancer Screening  09/09/2021   COVID-19 Vaccine (3 - 2023-24 season) 01/24/2022   INFLUENZA VACCINE  12/25/2022   Medicare Annual Wellness (AWV)  12/30/2023   Hepatitis C Screening  Completed   HPV VACCINES  Aged [employer] and counseling on the following was provided based on the above review of health and a plan/checklist for the patient, along with additional information discussed, was provided for the patient in the patient instructions :  -Provided counseling and plan for increased risk of falling if applicable per above screening. He is using cane and has balance exercises he can do he did in the past. Encouraged to do at least 3 days per week per guidelines.  -reviewed healthy lifestyle -Reviewed patient's current diet. Advised and counseled on a whole foods based healthy diet. A summary of a healthy diet was provided in the Patient Instructions. He is considering trying smoothies and oats with fruit and nuts for breakfast. -reviewed patient's current physical activity level and discussed exercise guidelines for adults. Discussed community resources and ideas for safe exercise at home to assist in meeting exercise guideline recommendations in a safe and healthy way. He is considering adding slowly some home chair exercises and walking in place holding on.  -Advise yearly dental visits at minimum and regular eye exams -Advised and counseled on tobacco use, risks of smoking and offered counseling/help  Follow up: see patient instructions   Patient Instructions  I really enjoyed getting to talk with you today! I  am available on Tuesdays and Thursdays for virtual visits if you have any questions or concerns, or if I can be of any further assistance.   CHECKLIST FROM ANNUAL WELLNESS VISIT:  -Follow up (please call to schedule if not scheduled after visit):   -yearly for annual wellness visit with primary care office  Here is a list of your preventive care/health maintenance measures and the plan for each if any are due:  PLAN For any measures below that may be due:  [facility] -we sent referral for colonosocpy today - call Dr. Claris Che office in 1-2 weeks if you have not been contacted for the appointment -consider the shingles vaccine -updated covid and flu shots are available each year in the fall  Health Maintenance  Topic Date Due   Pneumonia Vaccine 55+ Years old (1 of 2 - PCV) Never done   DTaP/Tdap/Td (1 - Tdap) Never done   Zoster Vaccines- Shingrix (1 of 2) Never done   Colonoscopy  08/29/2018   Lung Cancer Screening  09/09/2021   COVID-19 Vaccine (3 - 2023-24 season) 01/24/2022   INFLUENZA VACCINE  12/25/2022   Medicare Annual Wellness (AWV)  12/30/2023   Hepatitis C Screening  Completed   HPV VACCINES  Aged Out    -See a dentist at least yearly  -Get your eyes checked and then per your eye specialist's recommendations  -Other issues addressed today:  -please quit smoking. If you need help please le tus know! Also you can call 1-800-QUITNOW  -I have included below further information regarding a healthy whole foods based diet, physical activity guidelines  for adults, stress management and opportunities for social connections. I hope you find this information useful.   -----------------------------------------------------------------------------------------------------------------------------------------------------------------------------------------------------------------------------------------------------------  NUTRITION: -eat real  food: lots of colorful vegetables (half the plate) and fruits -5-7 servings of vegetables and fruits per day (fresh or steamed is best), exp. 2 servings of vegetables with lunch and dinner and 2 servings of fruit per day. Berries and greens such as kale and collards are great choices.  -consume on a regular basis: whole grains (make sure first ingredient on label contains the word "whole"), fresh fruits, fish, nuts, seeds, healthy oils (such as olive oil, avocado oil, grape seed oil) -may eat small amounts of dairy and lean meat on occasion, but avoid processed meats such as ham, bacon, lunch meat, etc. -drink water -try to avoid fast food and pre-packaged foods, processed meat -most experts advise limiting sodium to < 2300mg  per day, should limit further is any chronic conditions such as high blood pressure, heart disease, diabetes, etc. The American Heart Association advised that < 1500mg  is is ideal -try to avoid foods that contain any ingredients with names you do not recognize  -try to avoid sugar/sweets (except for the natural sugar that occurs in fresh fruit) -try to avoid sweet drinks -try to avoid white rice, white bread, pasta (unless whole grain), white or yellow potatoes  EXERCISE GUIDELINES FOR ADULTS: -if you wish to increase your physical activity, do so gradually and with the approval of your doctor -STOP and seek medical care immediately if you have any chest pain, chest discomfort or trouble breathing when starting or increasing exercise  -move and stretch your body, legs, feet and arms when sitting for long periods -Physical activity guidelines for optimal health in adults: -least 150 minutes per week of aerobic exercise (can talk, but not sing) once approved by your doctor, 20-30 minutes of sustained activity or two 10 minute episodes of sustained activity every day.  -resistance training at least 2 days per week if approved by your doctor -balance exercises 3+ days per  week:   Stand somewhere where you have something sturdy to hold onto if you lose balance.    1) lift up on toes, start with 5x per day and work up to 20x   2) stand and lift on leg straight out to the side so that foot is a few inches of the floor, start with 5x each side and work up to 20x each side   3) stand on one foot, start with 5 seconds each side and work up to 20 seconds on each side  If you need ideas or help with getting more active:  -Silver sneakers https://tools.silversneakers.com  -Walk with a Doc: http://www.duncan-williams.com/  -try to include resistance (weight lifting/strength building) and balance exercises twice per week: or the following link for ideas: http://castillo-powell.com/  BuyDucts.dk  STRESS MANAGEMENT: -can try meditating, or just sitting quietly with deep breathing while intentionally relaxing all parts of your body for 5 minutes daily -if you need further help with stress, anxiety or depression please follow up with your primary doctor or contact the wonderful folks at [facility]: [phone]  SOCIAL CONNECTIONS: -options in [place] if you wish to engage in more social and exercise related activities:  -Silver sneakers https://tools.silversneakers.com  -Walk with a Doc: http://www.duncan-williams.com/  -Check out the [facility] Active Adults 50+ section on the [place] [employer] (hiking clubs, book clubs, cards and games, chess, exercise classes, aquatic classes and much more) - see the website for  details: https://www.[place].gov/departments/parks-recreation/active-adults50  -YouTube has lots of exercise videos for different ages and abilities as well  -[facility] (a variety of indoor and outdoor inperson activities for adults). [phone]. [address]  -Virtual Online Classes (a variety of topics): see  seniorplanet.org or call [phone]  -consider volunteering at a school, hospice center, church, senior center or elsewhere           Terressa Koyanagi, DO

## 2022-12-31 NOTE — Telephone Encounter (Signed)
Attempted to call Marylene Land back with no success. Will try again later

## 2023-01-01 ENCOUNTER — Telehealth: Payer: Self-pay | Admitting: Family Medicine

## 2023-01-01 NOTE — Telephone Encounter (Signed)
Spoke with Anglea from [employer]

## 2023-01-01 NOTE — Telephone Encounter (Signed)
Calling to obtain patient diagnosis code. Says she sent a fax requesting records but she does not need the records, just patient's diagnosis

## 2023-01-02 NOTE — Telephone Encounter (Signed)
Spoke with Ryan Montgomery with [employer] regarding this message, stated that she spoke with someone at our office and provided diagnoses needed for pt service.

## 2023-03-27 ENCOUNTER — Telehealth: Payer: Self-pay | Admitting: Family Medicine

## 2023-03-27 NOTE — Telephone Encounter (Signed)
Prescription Request  03/27/2023  LOV: 09/15/2022  What is the name of the medication or equipment? celecoxib (CELEBREX) 200 MG capsule   Have you contacted your pharmacy to request a refill? No   Which pharmacy would you like this sent to?   [id number] [place], [place] - [address] AT [facility] OF [facility] [address] [place] [address] Phone: [phone] Fax: [phone]    Patient notified that their request is being sent to the clinical staff for review and that they should receive a response within 2 business days.   Please advise at Mobile [phone] (mobile)

## 2023-03-30 ENCOUNTER — Other Ambulatory Visit: Payer: Self-pay

## 2023-03-30 MED ORDER — CELECOXIB 200 MG PO CAPS: 200.0000 mg | ORAL_CAPSULE | Freq: Two times a day (BID) | ORAL | 5 refills | Status: DC | PRN

## 2023-03-30 NOTE — Telephone Encounter (Signed)
Rx sent 

## 2023-04-24 ENCOUNTER — Other Ambulatory Visit: Payer: Self-pay

## 2023-04-24 ENCOUNTER — Emergency Department (HOSPITAL_COMMUNITY)
Admission: EM | Admit: 2023-04-24 | Discharge: 2023-04-24 | Disposition: A | Discharge status: Home / Self Care | Payer: No Typology Code available for payment source | Attending: Emergency Medicine | Admitting: Emergency Medicine

## 2023-04-24 ENCOUNTER — Emergency Department (HOSPITAL_COMMUNITY): Payer: No Typology Code available for payment source

## 2023-04-24 DIAGNOSIS — Z79899 Other long term (current) drug therapy: Secondary | ICD-10-CM | POA: Diagnosis not present

## 2023-04-24 DIAGNOSIS — I10 Essential (primary) hypertension: Secondary | ICD-10-CM | POA: Diagnosis not present

## 2023-04-24 DIAGNOSIS — R55 Syncope and collapse: Secondary | ICD-10-CM | POA: Diagnosis present

## 2023-04-24 DIAGNOSIS — Z7982 Long term (current) use of aspirin: Secondary | ICD-10-CM | POA: Diagnosis not present

## 2023-04-24 DIAGNOSIS — R531 Weakness: Secondary | ICD-10-CM | POA: Insufficient documentation

## 2023-04-24 LAB — CBC
HCT: 44.4 % (ref 39.0–52.0)
Hemoglobin: 14.4 g/dL (ref 13.0–17.0)
MCH: 31.1 pg (ref 26.0–34.0)
MCHC: 32.4 g/dL (ref 30.0–36.0)
MCV: 95.9 fL (ref 80.0–100.0)
Platelets: 192 10*3/uL (ref 150–400)
RBC: 4.63 MIL/uL (ref 4.22–5.81)
RDW: 12.6 % (ref 11.5–15.5)
WBC: 10.8 10*3/uL — ABNORMAL HIGH (ref 4.0–10.5)
nRBC: 0 % (ref 0.0–0.2)

## 2023-04-24 LAB — BASIC METABOLIC PANEL
Anion gap: 9 (ref 5–15)
BUN: 17 mg/dL (ref 8–23)
CO2: 21 mmol/L — ABNORMAL LOW (ref 22–32)
Calcium: 9.3 mg/dL (ref 8.9–10.3)
Chloride: 108 mmol/L (ref 98–111)
Creatinine, Ser: 1.19 mg/dL (ref 0.61–1.24)
GFR, Estimated: >60 mL/min (ref >60)
Glucose, Bld: 99 mg/dL (ref 70–99)
Potassium: 3.6 mmol/L (ref 3.5–5.1)
Sodium: 138 mmol/L (ref 135–145)

## 2023-04-24 LAB — CBG MONITORING, ED: Glucose-Capillary: 109 mg/dL — ABNORMAL HIGH (ref 70–99)

## 2023-04-24 LAB — URINALYSIS, ROUTINE W REFLEX MICROSCOPIC
Bilirubin Urine: NEGATIVE
Glucose, UA: NEGATIVE mg/dL
Hgb urine dipstick: NEGATIVE
Ketones, ur: 5 mg/dL — AB
Leukocytes,Ua: NEGATIVE
Nitrite: NEGATIVE
Protein, ur: NEGATIVE mg/dL
Specific Gravity, Urine: 1.026 (ref 1.005–1.030)
pH: 5 (ref 5.0–8.0)

## 2023-04-24 LAB — RAPID URINE DRUG SCREEN, HOSP PERFORMED
Amphetamines: NOT DETECTED
Barbiturates: NOT DETECTED
Benzodiazepines: NOT DETECTED
Cocaine: NOT DETECTED
Opiates: NOT DETECTED
Tetrahydrocannabinol: NOT DETECTED

## 2023-04-24 LAB — TROPONIN I (HIGH SENSITIVITY): Troponin I (High Sensitivity): 5 ng/L (ref <18)

## 2023-04-24 LAB — ETHANOL: Alcohol, Ethyl (B): <10 mg/dL (ref <10)

## 2023-04-24 NOTE — ED Provider Notes (Signed)
[place] EMERGENCY DEPARTMENT AT [facility] Provider Note   CSN: [id number] Arrival date & time: 04/24/23  1328     History  Chief Complaint  Patient presents with   Weakness   Loss of Consciousness    Ryan Montgomery is a 67 y.o. male.  Patient with a history of HTN, HLD, hepatitis C, CVA presents after passing out at a family function yesterday. He was told by family that his heart stopped and he was given CPR by family members. He refused to come to the hospital for evaluation at that time but came today at his family's insistence. He is asymptomatic. He reports history of passing out "and they can't ever tell me why". He also states that when it has occurred he has been drinking with family. No history of MI.   The history is provided by the patient. No language interpreter was used.  Weakness Associated symptoms: syncope   Loss of Consciousness Associated symptoms: weakness        Home Medications Prior to Admission medications   Medication Sig Start Date End Date Taking? Authorizing Provider  amLODipine (NORVASC) 5 MG tablet Take 1 tablet (5 mg total) by mouth daily. 06/14/21   Tyrone Nine, MD  aspirin EC 81 MG EC tablet Take 1 tablet (81 mg total) by mouth daily. Swallow whole. 06/14/21   Tyrone Nine, MD  atorvastatin (LIPITOR) 20 MG tablet Take 1 tablet (20 mg total) by mouth daily. 06/14/21   Tyrone Nine, MD  celecoxib (CELEBREX) 200 MG capsule Take 1 capsule (200 mg total) by mouth 2 (two) times daily as needed for moderate pain (pain score 4-6). 03/30/23   Nelwyn Salisbury, MD  tamsulosin (FLOMAX) 0.4 MG CAPS capsule Take 1 capsule (0.4 mg total) by mouth daily after supper. 12/28/20   Melene Plan, DO      Allergies    Moxifloxacin    Review of Systems   Review of Systems  Cardiovascular:  Positive for syncope.  Neurological:  Positive for weakness.    Physical Exam Updated Vital Signs BP 131/89 (BP Location: Right Arm)   Pulse (!) 103   Temp  97.9 F (36.6 C) (Oral)   Resp 18   Ht 5\' 11"  (1.803 m)   Wt 97.5 kg   SpO2 97%   BMI 29.99 kg/m  Physical Exam Vitals and nursing note reviewed.  Constitutional:      Appearance: Normal appearance.  HENT:     Head: Normocephalic.  Eyes:     Pupils: Pupils are equal, round, and reactive to light.  Cardiovascular:     Rate and Rhythm: Normal rate and regular rhythm.     Heart sounds: No murmur heard. Pulmonary:     Effort: Pulmonary effort is normal.     Breath sounds: No wheezing, rhonchi or rales.  Abdominal:     General: There is no distension.     Palpations: Abdomen is soft.  Musculoskeletal:        General: Normal range of motion.     Cervical back: Normal range of motion and neck supple.     Right lower leg: No edema.     Left lower leg: No edema.  Skin:    General: Skin is warm and dry.  Neurological:     Mental Status: He is alert and oriented to person, place, and time.     ED Results / Procedures / Treatments   Labs (all labs ordered are listed,  but only abnormal results are displayed) Labs Reviewed  BASIC METABOLIC PANEL - Abnormal; Notable for the following components:      Result Value   CO2 21 (*)    All other components within normal limits  CBC - Abnormal; Notable for the following components:   WBC 10.8 (*)    All other components within normal limits  CBG MONITORING, ED - Abnormal; Notable for the following components:   Glucose-Capillary 109 (*)    All other components within normal limits  ETHANOL  URINALYSIS, ROUTINE W REFLEX MICROSCOPIC  RAPID URINE DRUG SCREEN, HOSP PERFORMED  TROPONIN I (HIGH SENSITIVITY)   Results for orders placed or performed during the hospital encounter of 04/24/23  Basic metabolic panel  Result Value Ref Range   Sodium 138 135 - 145 mmol/L   Potassium 3.6 3.5 - 5.1 mmol/L   Chloride 108 98 - 111 mmol/L   CO2 21 (L) 22 - 32 mmol/L   Glucose, Bld 99 70 - 99 mg/dL   BUN 17 8 - 23 mg/dL   Creatinine, Ser 1.61  0.61 - 1.24 mg/dL   Calcium 9.3 8.9 - 09.6 mg/dL   GFR, Estimated >04 >54 mL/min   Anion gap 9 5 - 15  CBC  Result Value Ref Range   WBC 10.8 (H) 4.0 - 10.5 K/uL   RBC 4.63 4.22 - 5.81 MIL/uL   Hemoglobin 14.4 13.0 - 17.0 g/dL   HCT 09.8 11.9 - 14.7 %   MCV 95.9 80.0 - 100.0 fL   MCH 31.1 26.0 - 34.0 pg   MCHC 32.4 30.0 - 36.0 g/dL   RDW 82.9 56.2 - 13.0 %   Platelets 192 150 - 400 K/uL   nRBC 0.0 0.0 - 0.2 %  Ethanol  Result Value Ref Range   Alcohol, Ethyl (B) <10 <10 mg/dL  CBG monitoring, ED  Result Value Ref Range   Glucose-Capillary 109 (H) 70 - 99 mg/dL   Results for orders placed or performed during the hospital encounter of 04/24/23  Basic metabolic panel  Result Value Ref Range   Sodium 138 135 - 145 mmol/L   Potassium 3.6 3.5 - 5.1 mmol/L   Chloride 108 98 - 111 mmol/L   CO2 21 (L) 22 - 32 mmol/L   Glucose, Bld 99 70 - 99 mg/dL   BUN 17 8 - 23 mg/dL   Creatinine, Ser 8.65 0.61 - 1.24 mg/dL   Calcium 9.3 8.9 - 78.4 mg/dL   GFR, Estimated >69 >62 mL/min   Anion gap 9 5 - 15  CBC  Result Value Ref Range   WBC 10.8 (H) 4.0 - 10.5 K/uL   RBC 4.63 4.22 - 5.81 MIL/uL   Hemoglobin 14.4 13.0 - 17.0 g/dL   HCT 95.2 84.1 - 32.4 %   MCV 95.9 80.0 - 100.0 fL   MCH 31.1 26.0 - 34.0 pg   MCHC 32.4 30.0 - 36.0 g/dL   RDW 40.1 02.7 - 25.3 %   Platelets 192 150 - 400 K/uL   nRBC 0.0 0.0 - 0.2 %  Ethanol  Result Value Ref Range   Alcohol, Ethyl (B) <10 <10 mg/dL  CBG monitoring, ED  Result Value Ref Range   Glucose-Capillary 109 (H) 70 - 99 mg/dL     EKG EKG Interpretation Date/Time:  Friday April 24 2023 13:45:10 EST Ventricular Rate:  106 PR Interval:  168 QRS Duration:  94 QT Interval:  319 QTC Calculation: 424 R Axis:   32  Text Interpretation: Sinus tachycardia Nonspecific T abnormalities, inferior leads No significant change since last tracing Confirmed by Margarita Grizzle [phone]) on 04/24/2023 3:09:38 PM  Radiology DG Chest Portable 1  View  Result Date: 04/24/2023 CLINICAL DATA:  Several week history of worsening weakness with recent episode of unconsciousness requiring CPR EXAM: PORTABLE CHEST 1 VIEW COMPARISON:  Chest radiograph dated 09/21/2022 FINDINGS: Normal lung volumes. No focal consolidations. No pleural effusion or pneumothorax. The heart size and mediastinal contours are within normal limits. No acute osseous abnormality. Implanted loop recorder projects over the left lower chest. IMPRESSION: No active disease. Electronically Signed   By: Agustin Cree M.D.   On: 04/24/2023 14:55    Procedures Procedures    Medications Ordered in ED Medications - No data to display  ED Course/ Medical Decision Making/ A&P                                 Medical Decision Making This patient presents to the ED for concern of syncope, this involves an extensive number of treatment options, and is a complaint that carries with it a high risk of complications and morbidity.  The differential diagnosis includes arrhythmia, ACS, neurologic event   Co morbidities that complicate the patient evaluation  HTN, HLD,    Additional history obtained:  Additional history and/or information obtained from chart review, notable for Past 2 ED visits reviewed:  4/28 - Syncope 7/28 - ETOH intoxication with hypotension   Lab Tests:  I Ordered, and personally interpreted labs.  The pertinent results include:  Alcohol <10. CBC essentially normal - hgb 14.4. Troponin pending.     Imaging Studies ordered:  I ordered imaging studies including CXR I independently visualized and interpreted imaging which showed No active disease I agree with the radiologist interpretation   Cardiac Monitoring:  The patient was maintained on a cardiac monitor.  I personally viewed and interpreted the cardiac monitored which showed an underlying rhythm of: Sinus tachycardia, rate 103  Test Considered:  CXR, troponin x 1, CBC, Bmet, ETOH, UDS,  EKG   Problem List / ED Course:  Recurrent syncope where patient feels alcohol involved in each event. "Flat lined yesterday per family". EMS not called. Asymptomatic today.    Reevaluation: Remains asymptomatic during ED evaluation.   Social Determinants of Health:  Admits to alcohol use at time of events Poor insight into health are.    Disposition: Labs pending at time of sign out to [employer], PA-C. Anticipate discharge home to PCP follow up.   Amount and/or Complexity of Data Reviewed Labs: ordered. Radiology: ordered.           Final Clinical Impression(s) / ED Diagnoses Final diagnoses:  Syncope and collapse    Rx / DC Orders ED Discharge Orders     None         [name], PA-C 04/24/23 1534    Margarita Grizzle, MD 04/25/23 [phone]

## 2023-04-24 NOTE — ED Triage Notes (Addendum)
Pt arrived via POV. Per wife pt received CPR from son last night when pt when unconscious at dinner table last night. Pt refused transport to the hospital. Pt has had increasing weakness over past few weeks.  No c/o chest pain

## 2023-04-24 NOTE — ED Notes (Signed)
109

## 2023-04-24 NOTE — ED Provider Triage Note (Signed)
Emergency Medicine Provider Triage Evaluation Note  Ryan Montgomery , a 67 y.o. male  was evaluated in triage.  Pt complains of syncope.  Review of Systems  Positive: "Flatlined yesterday",, recurrent syncope Negative: Fever, current CP, SOB  Physical Exam  BP 131/89 (BP Location: Right Arm)   Pulse (!) 103   Temp 97.9 F (36.6 C) (Oral)   Resp 18   Ht 5\' 11"  (1.803 m)   Wt 97.5 kg   SpO2 97%   BMI 29.99 kg/m  Gen:   Awake, no distress   Resp:  Normal effort  MSK:   Moves extremities without difficulty  Other:    Medical Decision Making  Medically screening exam initiated at 2:06 PM.  Appropriate orders placed.  Ryan Montgomery was informed that the remainder of the evaluation will be completed by another provider, this initial triage assessment does not replace that evaluation, and the importance of remaining in the ED until their evaluation is complete.  Patient had a syncopal episode yesterday and family said he "flat lined" and gave him compressions. No EMS was called and he refused to come to hospital. Today he came because of family pressure. States "no one can tell me why I keep passing out"   Ryan Anis, PA-C 04/24/23 1408

## 2023-04-24 NOTE — ED Provider Notes (Signed)
Patient given in sign out by [employer], PA-C.  Please review their note for patient HPI, physical exam, workup.  At this time the plan is dc after trop as previous provider has already spoken with patient and wife and they are okay with following up outpatient with 1 troponin.  Troponin was normal so will discharge as per the original plan.  Patient stable to be discharged.  Patient verbalized understanding acceptance of this.  Encourage patient to follow-up with primary care provider in regards to recent ER visit and symptoms.    Netta Corrigan, PA-C 04/24/23 1700    Laurence Spates, MD 04/24/23 2253

## 2023-04-24 NOTE — Discharge Instructions (Signed)
Your labs and EKG are normal today and do not indicate what happened yesterday when you passed out. There is no evidence of heart event like a heart attack.   Please follow up with Dr. Abran Cantor for recheck and to discuss whether referral to cardiology is appropriate.

## 2023-04-27 ENCOUNTER — Encounter: Payer: Self-pay | Admitting: Family Medicine

## 2023-04-27 ENCOUNTER — Ambulatory Visit (INDEPENDENT_AMBULATORY_CARE_PROVIDER_SITE_OTHER): Payer: Medicare Other | Admitting: Family Medicine

## 2023-04-27 VITALS — BP 110/70 | HR 107 | Temp 98.5°F | Wt 201.0 lb

## 2023-04-27 DIAGNOSIS — R55 Syncope and collapse: Secondary | ICD-10-CM

## 2023-04-27 DIAGNOSIS — I10 Essential (primary) hypertension: Secondary | ICD-10-CM | POA: Insufficient documentation

## 2023-04-27 DIAGNOSIS — Z8673 Personal history of transient ischemic attack (TIA), and cerebral infarction without residual deficits: Secondary | ICD-10-CM | POA: Diagnosis not present

## 2023-04-27 DIAGNOSIS — G40909 Epilepsy, unspecified, not intractable, without status epilepticus: Secondary | ICD-10-CM

## 2023-04-27 MED ORDER — METOPROLOL SUCCINATE ER 25 MG PO TB24: 25.0000 mg | ORAL_TABLET | Freq: Every day | ORAL | 2 refills | Status: DC

## 2023-04-27 NOTE — Progress Notes (Signed)
   Subjective:    Patient ID: Ryan Montgomery, male    DOB: [birth year], 67 y.o.   MRN: [id number]  HPI Here with his wife to follow up on an ED visit on 04-24-23 after a brief syncopal episode. This was witnessed by the family. He says he completely stopped drinking alcohol 3 months ago, but on that day he was attending a family function. He says he drank 2 shots of liquor and 3 beers. After awhile he felt weak and sat down in a recliner. Then he became unresponsive. A family member checked his pulse and couldn't feel any, so he began CPR and they called EMS. After about 30 seconds of this he regained consciousness. He was able to speak and walk. He was taken to the ED where his exam was normal. All labs were normal. A CXR was normal. EKG showed only sinus tachycardia. No real etiology for the episode was found so they sent him back home. He has felt fine since then, and his wife says he has acted normally. Of note, his BP has been running fairly low for several months (this has been managed by the [place] [facility]).    Review of Systems  Constitutional: Negative.   Respiratory: Negative.    Cardiovascular: Negative.   Gastrointestinal: Negative.   Genitourinary: Negative.   Neurological:  Positive for syncope and light-headedness. Negative for dizziness, tremors, facial asymmetry, speech difficulty, weakness, numbness and headaches.       Objective:   Physical Exam Constitutional:      Appearance: Normal appearance.  Cardiovascular:     Rate and Rhythm: Regular rhythm. Tachycardia present.     Pulses: Normal pulses.     Heart sounds: Normal heart sounds.  Pulmonary:     Effort: Pulmonary effort is normal.     Breath sounds: Normal breath sounds.  Musculoskeletal:     Right lower leg: No edema.     Left lower leg: No edema.  Neurological:     Mental Status: He is alert and oriented to person, place, and time. Mental status is at baseline.           Assessment & Plan:  He recently had  a brief syncopal episode that was likely the result of dehydration and hypotension. I advised him to droink plenty of water every day and to avoid alcohol. We will stop the Amlodipine and start him on Metoprolol succinate 25 mg daily. Recheck here in 2 weeks. We spent a total of ( 35  ) minutes reviewing records and discussing these issues.  Gershon Crane, MD

## 2023-05-11 ENCOUNTER — Ambulatory Visit: Payer: Medicare Other | Admitting: Family Medicine

## 2023-05-13 ENCOUNTER — Ambulatory Visit: Payer: Medicare Other | Admitting: Family Medicine

## 2023-05-13 ENCOUNTER — Encounter: Payer: Self-pay | Admitting: Family Medicine

## 2023-05-13 VITALS — BP 110/78 | HR 85 | Temp 98.3°F | Wt 201.0 lb

## 2023-05-13 DIAGNOSIS — I10 Essential (primary) hypertension: Secondary | ICD-10-CM

## 2023-05-13 DIAGNOSIS — R55 Syncope and collapse: Secondary | ICD-10-CM | POA: Diagnosis not present

## 2023-05-13 NOTE — Progress Notes (Signed)
   Subjective:    Patient ID: Ryan Montgomery, male    DOB: 05/16/1956, 67 y.o.   MRN: [id number]  HPI Here to follow up on HTN and on a syncopal episode he had on 04-24-23. We determined that this was likely a hypotensive event. We stopped his Amlodipine and started him on Metoprolol succinate 25 mg daily. Since then he has felt fine with no more lightheadedness. He also says that he has totally stopped drinking alcohol.    Review of Systems  Constitutional: Negative.   Respiratory: Negative.    Cardiovascular: Negative.   Neurological: Negative.        Objective:   Physical Exam Constitutional:      Appearance: Normal appearance.  Cardiovascular:     Rate and Rhythm: Normal rate and regular rhythm.     Pulses: Normal pulses.     Heart sounds: Normal heart sounds.  Pulmonary:     Effort: Pulmonary effort is normal.     Breath sounds: Normal breath sounds.  Neurological:     Mental Status: He is alert and oriented to person, place, and time. Mental status is at baseline.           Assessment & Plan:  His HTN is well controlled without hypotensive episodes. We will stay on the current regime. Follow up in 2 months.  Gershon Crane, MD

## 2023-06-24 ENCOUNTER — Ambulatory Visit (INDEPENDENT_AMBULATORY_CARE_PROVIDER_SITE_OTHER): Payer: Medicare Other | Admitting: Family Medicine

## 2023-06-24 ENCOUNTER — Encounter: Payer: Self-pay | Admitting: Family Medicine

## 2023-06-24 VITALS — BP 124/82 | HR 103 | Temp 98.9°F | Wt 197.0 lb

## 2023-06-24 DIAGNOSIS — R0989 Other specified symptoms and signs involving the circulatory and respiratory systems: Secondary | ICD-10-CM | POA: Diagnosis not present

## 2023-06-24 DIAGNOSIS — J40 Bronchitis, not specified as acute or chronic: Secondary | ICD-10-CM

## 2023-06-24 LAB — POCT INFLUENZA A/B
Influenza A, POC: NEGATIVE
Influenza B, POC: NEGATIVE

## 2023-06-24 LAB — POC COVID19 BINAXNOW: SARS Coronavirus 2 Ag: NEGATIVE

## 2023-06-24 MED ORDER — AMOXICILLIN-POT CLAVULANATE 875-125 MG PO TABS: 1.0000 | ORAL_TABLET | Freq: Two times a day (BID) | ORAL | 0 refills | Status: DC

## 2023-06-24 NOTE — Progress Notes (Signed)
   Subjective:    Patient ID: Ryan Montgomery, male    DOB: 1955/08/16, 68 y.o.   MRN: [id number]  HPI Here for 2 weeks of chest tightness and coughing up yellow sputum. No fever or SOB.    Review of Systems  Constitutional: Negative.   HENT: Negative.    Eyes: Negative.   Respiratory:  Positive for cough. Negative for shortness of breath and wheezing.        Objective:   Physical Exam Constitutional:      Appearance: Normal appearance.  Cardiovascular:     Rate and Rhythm: Normal rate and regular rhythm.     Pulses: Normal pulses.     Heart sounds: Normal heart sounds.  Pulmonary:     Breath sounds: Wheezing and rhonchi present. No rales.  Neurological:     Mental Status: He is alert.           Assessment & Plan:  Bronchitis, treat with 10 days of Augmentin.  Gershon Crane, MD

## 2023-07-21 ENCOUNTER — Encounter: Payer: Medicare Other | Admitting: Family Medicine

## 2023-07-23 ENCOUNTER — Telehealth: Payer: Self-pay | Admitting: *Deleted

## 2023-07-23 NOTE — Telephone Encounter (Signed)
 Copied from CRM [id number]. Topic: Clinical - Medical Advice >> Jul 23, 2023  4:36 PM Tiffany H wrote: Reason for CRM: [place] with Social Services call to inquire if Dr. Clent Ridges has placed orders for assisted living or home healthcare or skilled nursing. She is looking for information regarding patient's treatment plan in terms of how much day to day care he needs.   Phone: [phone]

## 2023-07-24 NOTE — Telephone Encounter (Signed)
 Left detailed message for Charlcie Cradle with Social services, advised to call the office back regarding this

## 2023-07-24 NOTE — Telephone Encounter (Signed)
 I would recommend assisted living for him

## 2023-07-27 NOTE — Telephone Encounter (Signed)
 Spoke with [place] with Social services advised of Dr Clent Ridges recommendation on pt, verbalized understanding

## 2023-08-05 ENCOUNTER — Encounter: Payer: Medicare Other | Admitting: Family Medicine

## 2023-08-12 ENCOUNTER — Encounter: Admitting: Family Medicine

## 2023-08-28 ENCOUNTER — Emergency Department (HOSPITAL_COMMUNITY)

## 2023-08-28 ENCOUNTER — Other Ambulatory Visit: Payer: Self-pay

## 2023-08-28 ENCOUNTER — Encounter (HOSPITAL_COMMUNITY): Payer: Self-pay

## 2023-08-28 ENCOUNTER — Emergency Department (HOSPITAL_COMMUNITY)
Admission: EM | Admit: 2023-08-28 | Discharge: 2023-08-28 | Disposition: A | Discharge status: Home / Self Care | Attending: Emergency Medicine | Admitting: Emergency Medicine

## 2023-08-28 DIAGNOSIS — F17210 Nicotine dependence, cigarettes, uncomplicated: Secondary | ICD-10-CM | POA: Diagnosis not present

## 2023-08-28 DIAGNOSIS — Z8673 Personal history of transient ischemic attack (TIA), and cerebral infarction without residual deficits: Secondary | ICD-10-CM | POA: Insufficient documentation

## 2023-08-28 DIAGNOSIS — R9089 Other abnormal findings on diagnostic imaging of central nervous system: Secondary | ICD-10-CM | POA: Diagnosis not present

## 2023-08-28 DIAGNOSIS — R9431 Abnormal electrocardiogram [ECG] [EKG]: Secondary | ICD-10-CM | POA: Diagnosis not present

## 2023-08-28 DIAGNOSIS — Z96642 Presence of left artificial hip joint: Secondary | ICD-10-CM | POA: Insufficient documentation

## 2023-08-28 DIAGNOSIS — Y906 Blood alcohol level of 120-199 mg/100 ml: Secondary | ICD-10-CM | POA: Diagnosis not present

## 2023-08-28 DIAGNOSIS — R4781 Slurred speech: Secondary | ICD-10-CM | POA: Diagnosis present

## 2023-08-28 DIAGNOSIS — I10 Essential (primary) hypertension: Secondary | ICD-10-CM | POA: Insufficient documentation

## 2023-08-28 DIAGNOSIS — R6889 Other general symptoms and signs: Secondary | ICD-10-CM | POA: Diagnosis not present

## 2023-08-28 DIAGNOSIS — I6621 Occlusion and stenosis of right posterior cerebral artery: Secondary | ICD-10-CM | POA: Diagnosis not present

## 2023-08-28 DIAGNOSIS — I6523 Occlusion and stenosis of bilateral carotid arteries: Secondary | ICD-10-CM | POA: Diagnosis not present

## 2023-08-28 DIAGNOSIS — R791 Abnormal coagulation profile: Secondary | ICD-10-CM | POA: Diagnosis not present

## 2023-08-28 DIAGNOSIS — I6782 Cerebral ischemia: Secondary | ICD-10-CM | POA: Diagnosis not present

## 2023-08-28 DIAGNOSIS — Z7982 Long term (current) use of aspirin: Secondary | ICD-10-CM | POA: Diagnosis not present

## 2023-08-28 DIAGNOSIS — Z79899 Other long term (current) drug therapy: Secondary | ICD-10-CM | POA: Insufficient documentation

## 2023-08-28 DIAGNOSIS — F10920 Alcohol use, unspecified with intoxication, uncomplicated: Secondary | ICD-10-CM | POA: Insufficient documentation

## 2023-08-28 DIAGNOSIS — R262 Difficulty in walking, not elsewhere classified: Secondary | ICD-10-CM | POA: Insufficient documentation

## 2023-08-28 DIAGNOSIS — I639 Cerebral infarction, unspecified: Secondary | ICD-10-CM | POA: Diagnosis not present

## 2023-08-28 DIAGNOSIS — I700 Atherosclerosis of aorta: Secondary | ICD-10-CM | POA: Diagnosis not present

## 2023-08-28 DIAGNOSIS — R404 Transient alteration of awareness: Secondary | ICD-10-CM | POA: Diagnosis not present

## 2023-08-28 LAB — CBC
HCT: 42.6 % (ref 39.0–52.0)
Hemoglobin: 14 g/dL (ref 13.0–17.0)
MCH: 31.3 pg (ref 26.0–34.0)
MCHC: 32.9 g/dL (ref 30.0–36.0)
MCV: 95.1 fL (ref 80.0–100.0)
Platelets: 163 10*3/uL (ref 150–400)
RBC: 4.48 MIL/uL (ref 4.22–5.81)
RDW: 13.2 % (ref 11.5–15.5)
WBC: 15 10*3/uL — ABNORMAL HIGH (ref 4.0–10.5)
nRBC: 0 % (ref 0.0–0.2)

## 2023-08-28 LAB — ETHANOL: Alcohol, Ethyl (B): 167 mg/dL — ABNORMAL HIGH (ref <10)

## 2023-08-28 LAB — PROTIME-INR
INR: 1.2 (ref 0.8–1.2)
Prothrombin Time: 15.3 s — ABNORMAL HIGH (ref 11.4–15.2)

## 2023-08-28 LAB — COMPREHENSIVE METABOLIC PANEL WITH GFR
ALT: 14 U/L (ref 0–44)
AST: 22 U/L (ref 15–41)
Albumin: 3.7 g/dL (ref 3.5–5.0)
Alkaline Phosphatase: 55 U/L (ref 38–126)
Anion gap: 11 (ref 5–15)
BUN: 10 mg/dL (ref 8–23)
CO2: 22 mmol/L (ref 22–32)
Calcium: 9.1 mg/dL (ref 8.9–10.3)
Chloride: 108 mmol/L (ref 98–111)
Creatinine, Ser: 1.36 mg/dL — ABNORMAL HIGH (ref 0.61–1.24)
GFR, Estimated: 57 mL/min — ABNORMAL LOW (ref >60)
Glucose, Bld: 88 mg/dL (ref 70–99)
Potassium: 3.5 mmol/L (ref 3.5–5.1)
Sodium: 141 mmol/L (ref 135–145)
Total Bilirubin: 0.7 mg/dL (ref 0.0–1.2)
Total Protein: 7.7 g/dL (ref 6.5–8.1)

## 2023-08-28 LAB — DIFFERENTIAL
Abs Immature Granulocytes: 0 10*3/uL (ref 0.00–0.07)
Basophils Absolute: 0 10*3/uL (ref 0.0–0.1)
Basophils Relative: 0 %
Eosinophils Absolute: 0.3 10*3/uL (ref 0.0–0.5)
Eosinophils Relative: 2 %
Lymphocytes Relative: 82 %
Lymphs Abs: 12.3 10*3/uL — ABNORMAL HIGH (ref 0.7–4.0)
Monocytes Absolute: 0.5 10*3/uL (ref 0.1–1.0)
Monocytes Relative: 3 %
Neutro Abs: 2 10*3/uL (ref 1.7–7.7)
Neutrophils Relative %: 13 %
nRBC: 0 /100{WBCs}

## 2023-08-28 LAB — I-STAT CHEM 8, ED
BUN: 11 mg/dL (ref 8–23)
Calcium, Ion: 1.11 mmol/L — ABNORMAL LOW (ref 1.15–1.40)
Chloride: 108 mmol/L (ref 98–111)
Creatinine, Ser: 1.6 mg/dL — ABNORMAL HIGH (ref 0.61–1.24)
Glucose, Bld: 88 mg/dL (ref 70–99)
HCT: 44 % (ref 39.0–52.0)
Hemoglobin: 15 g/dL (ref 13.0–17.0)
Potassium: 3.5 mmol/L (ref 3.5–5.1)
Sodium: 144 mmol/L (ref 135–145)
TCO2: 22 mmol/L (ref 22–32)

## 2023-08-28 LAB — URINALYSIS, ROUTINE W REFLEX MICROSCOPIC
Bilirubin Urine: NEGATIVE
Glucose, UA: NEGATIVE mg/dL
Hgb urine dipstick: NEGATIVE
Ketones, ur: NEGATIVE mg/dL
Leukocytes,Ua: NEGATIVE
Nitrite: NEGATIVE
Protein, ur: NEGATIVE mg/dL
Specific Gravity, Urine: 1.002 — ABNORMAL LOW (ref 1.005–1.030)
pH: 6 (ref 5.0–8.0)

## 2023-08-28 LAB — APTT: aPTT: 30 s (ref 24–36)

## 2023-08-28 LAB — RAPID URINE DRUG SCREEN, HOSP PERFORMED
Amphetamines: NOT DETECTED
Barbiturates: NOT DETECTED
Benzodiazepines: NOT DETECTED
Cocaine: NOT DETECTED
Opiates: NOT DETECTED
Tetrahydrocannabinol: NOT DETECTED

## 2023-08-28 MED ORDER — IOHEXOL 350 MG/ML SOLN
60.0000 mL | Freq: Once | INTRAVENOUS | Status: AC | PRN
Start: 2023-08-28 — End: 2023-08-28
  Administered 2023-08-28: 60 mL via INTRAVENOUS

## 2023-08-28 MED ORDER — NICOTINE 21 MG/24HR TD PT24
21.0000 mg | MEDICATED_PATCH | Freq: Once | TRANSDERMAL | Status: DC
  Administered 2023-08-28: 21 mg via TRANSDERMAL
  Filled 2023-08-28: qty 1

## 2023-08-28 NOTE — Plan of Care (Signed)
 On-call neurology note  Patient brought in for abnormal gait and difficulty with speech.  Alcohol level elevated. 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 Current symptomatology likely related to alcohol intoxication I would give him time to metabolize his alcohol and discharged home with outpatient follow-up with neurology.  Continue aspirin. Plan was discussed with Dr.Scheving   -- Milon Dikes, MD Neurologist [facility]

## 2023-08-28 NOTE — ED Notes (Signed)
 Pt continually refuses to stay in the bed connected to monitor. When asked, pt cursing at staff. Pt ripped IV out of arm. Pacing around room repeatedly.

## 2023-08-28 NOTE — ED Notes (Signed)
 Patient attempted to walk out the EMS door. Patient cussing staff. Doc and RN notified of the event

## 2023-08-28 NOTE — ED Notes (Signed)
 Patient transported to CT

## 2023-08-28 NOTE — ED Triage Notes (Signed)
 Pt presents to ED from home via EMS with reports of expressive aphasia, abnormal gait dragging both feet. Oriented to person and place. No hx of seizures. Home health called EMS that he was unable to answer her questions and walk normally. Hx of TIA with no significant deficits. Unknown LNW, caregiver noticed differences this afternoon when coming to visit today.   BG WNL

## 2023-08-28 NOTE — ED Notes (Signed)
 Patient is currently refusing vital signs.

## 2023-08-28 NOTE — ED Notes (Addendum)
 Pt attempted to leave, stating he has waited too long and wants a cigarette. Security called, MD spoke to him in the hallway. Pt still wanting to leave, despite being told of stroke concerns. Pt convinced to stay as long as he is free to go after MRI

## 2023-08-28 NOTE — ED Notes (Signed)
 Back from MRI.

## 2023-08-28 NOTE — ED Provider Notes (Signed)
 [place] EMERGENCY DEPARTMENT AT [facility] Provider Note  CSN: [id number] Arrival date & time: 08/28/23 1626  Chief Complaint(s) Aphasia and Abnormal Gait  HPI Ryan Montgomery is a 68 y.o. male history of prior stroke presenting to the emergency department with trouble walking.  Patient home health nurse came today, felt patient was having some abnormal speech, noticed that the patient was having trouble walking.  Patient denies any knowledge of either of these complaints.  He reports he feels well.  Denies any pain or other symptoms.  No chest pain, shortness of breath, abdominal pain.  Denies any visual disturbances.  Denies numbness or tingling or weakness.  He reports his gait is normal.  Paramedics report his gait was not normal.  Unknown last known well given the patient reports that he feels normal.  His caregiver had not been to see him since March 25.   Past Medical History Past Medical History:  Diagnosis Date   Chickenpox    Chickenpox    Heat stroke    Hepatitis C    Stroke [facility])    Patient Active Problem List   Diagnosis Date Noted   HTN (hypertension) 04/27/2023   Aortic dilatation (HCC) 06/04/2021   Dysphagia 06/04/2021   Acute CVA (cerebrovascular accident) (HCC) 06/03/2021   AKI (acute kidney injury) (HCC) 06/03/2021   Tobacco use 06/03/2021   Chronic pain in left foot 05/14/2021   Rib fractures 09/09/2020   Chronic left hip pain 12/09/2019   Seizure disorder (HCC)    Syncope 11/27/2019   History of CVA (cerebrovascular accident) 2018   Hepatitis C 07/18/2013   Home Medication(s) Prior to Admission medications   Medication Sig Start Date End Date Taking? Authorizing Provider  amoxicillin-clavulanate (AUGMENTIN) 875-125 MG tablet Take 1 tablet by mouth 2 (two) times daily. 06/24/23   Nelwyn Salisbury, MD  aspirin EC 81 MG EC tablet Take 1 tablet (81 mg total) by mouth daily. Swallow whole. 06/14/21   Tyrone Nine, MD  atorvastatin (LIPITOR) 20 MG  tablet Take 1 tablet (20 mg total) by mouth daily. 06/14/21   Tyrone Nine, MD  celecoxib (CELEBREX) 200 MG capsule Take 1 capsule (200 mg total) by mouth 2 (two) times daily as needed for moderate pain (pain score 4-6). 03/30/23   Nelwyn Salisbury, MD  metoprolol succinate (TOPROL-XL) 25 MG 24 hr tablet Take 1 tablet (25 mg total) by mouth daily. 04/27/23   Nelwyn Salisbury, MD  tamsulosin (FLOMAX) 0.4 MG CAPS capsule Take 1 capsule (0.4 mg total) by mouth daily after supper. 12/28/20   Melene Plan, DO                                                                                                                                    Past Surgical History Past Surgical History:  Procedure Laterality Date   BUBBLE STUDY  06/10/2021   Procedure: BUBBLE STUDY;  Surgeon: Sande Rives, MD;  Location: [facility] ENDOSCOPY;  Service: Cardiovascular;;   colonoscopy  08-28-08   per Dr. Jarold Motto, clear, repeat in 10 yrs    fx rt ankle     pins placed   JOINT REPLACEMENT Left 09/12/2019   LOOP RECORDER INSERTION N/A 06/10/2021   Procedure: LOOP RECORDER INSERTION;  Surgeon: Regan Lemming, MD;  Location: [facility] INVASIVE CV LAB;  Service: Cardiovascular;  Laterality: N/A;   TEE WITHOUT CARDIOVERSION N/A 06/10/2021   Procedure: TRANSESOPHAGEAL ECHOCARDIOGRAM (TEE);  Surgeon: Sande Rives, MD;  Location: [facility] ENDOSCOPY;  Service: Cardiovascular;  Laterality: N/A;   TOTAL HIP ARTHROPLASTY Left 09/12/2019   per Dr. Josefine Class at [facility]    Family History Family History  Problem Relation Age of Onset   Hypertension Other    Lung cancer Other     Social History Social History   Tobacco Use   Smoking status: Every Day    Current packs/day: 0.50    Types: Cigarettes   Smokeless tobacco: Never  Vaping Use   Vaping status: Never Used  Substance Use Topics   Alcohol use: Yes   Drug use: No   Allergies Moxifloxacin  Review of Systems Review of Systems  All other systems reviewed and are  negative.   Physical Exam Vital Signs  I have reviewed the triage vital signs BP (!) 168/87 (BP Location: Left Arm)   Pulse 61   Temp (!) 97.5 F (36.4 C) (Oral)   Resp 18   Ht 5\' 11"  (1.803 m)   Wt 90.7 kg   SpO2 98%   BMI 27.89 kg/m  Physical Exam Vitals and nursing note reviewed.  Constitutional:      General: He is not in acute distress.    Appearance: Normal appearance.  HENT:     Mouth/Throat:     Mouth: Mucous membranes are moist.  Eyes:     Conjunctiva/sclera: Conjunctivae normal.  Cardiovascular:     Rate and Rhythm: Normal rate and regular rhythm.  Pulmonary:     Effort: Pulmonary effort is normal. No respiratory distress.     Breath sounds: Normal breath sounds.  Abdominal:     General: Abdomen is flat.     Palpations: Abdomen is soft.     Tenderness: There is no abdominal tenderness.  Musculoskeletal:     Right lower leg: No edema.     Left lower leg: No edema.  Skin:    General: Skin is warm and dry.     Capillary Refill: Capillary refill takes less than 2 seconds.  Neurological:     Mental Status: He is alert. Mental status is at baseline.     Comments: Oriented to self, place, unsure of year.  Occasionally gives somewhat bizarre responses to questions.  Cranial nerves II through XII intact, strength 5 out of 5 in the bilateral upper and lower extremities.  No sensory deficit.  Questionable left sided dysmetria on finger-nose-finger testing.  Able to count to 10.  No obvious aphasia or abnormal speech.  Gait testing deferred  Psychiatric:        Mood and Affect: Mood normal.        Behavior: Behavior normal.     ED Results and Treatments Labs (all labs ordered are listed, but only abnormal results are displayed) Labs Reviewed  ETHANOL - Abnormal; Notable for the following components:      Result Value   Alcohol, Ethyl (B) 167 (*)    All other  components within normal limits  PROTIME-INR - Abnormal; Notable for the following components:    Prothrombin Time 15.3 (*)    All other components within normal limits  CBC - Abnormal; Notable for the following components:   WBC 15.0 (*)    All other components within normal limits  DIFFERENTIAL - Abnormal; Notable for the following components:   Lymphs Abs 12.3 (*)    All other components within normal limits  COMPREHENSIVE METABOLIC PANEL WITH GFR - Abnormal; Notable for the following components:   Creatinine, Ser 1.36 (*)    GFR, Estimated 57 (*)    All other components within normal limits  URINALYSIS, ROUTINE W REFLEX MICROSCOPIC - Abnormal; Notable for the following components:   Color, Urine STRAW (*)    Specific Gravity, Urine 1.002 (*)    All other components within normal limits  I-STAT CHEM 8, ED - Abnormal; Notable for the following components:   Creatinine, Ser 1.60 (*)    Calcium, Ion 1.11 (*)    All other components within normal limits  APTT  RAPID URINE DRUG SCREEN, HOSP PERFORMED  PATHOLOGIST SMEAR REVIEW  I-STAT CHEM 8, ED                                                                                                                          Radiology No results found.   Pertinent labs & imaging results that were available during my care of the patient were reviewed by me and considered in my medical decision making (see MDM for details).  Medications Ordered in ED Medications  iohexol (OMNIPAQUE) 350 MG/ML injection 60 mL (60 mLs Intravenous Contrast Given 08/28/23 1800)                                                                                                                                     Procedures Procedures  (including critical care time)  Medical Decision Making / ED Course   MDM:  68 year old presenting to the emergency department with abnormal gait.  Patient reports that he does not really feel like he has any new symptoms, but he does seem mildly confused and gives some strange answers to questions.  Per paramedics his gait  was kind of abnormal.  His last known well time is unknown  Will obtain testing including CTA head and neck, CT noncontrast head, labs, ethanol level.  Given his gait  abnormality may need to pursue further testing such as MRI.  Will reassess.  Clinical Course as of 08/31/23 0714  Fri Aug 28, 2023  1814 Alcohol, Ethyl (B)(!): 167 [WS]  2256 MRI shows possible punctate lentiform nucleus infarct.  Discussed with Dr. Jerrell Belfast with neurology.  He reviewed the MRI.  He feels patient's symptoms are much more likely to be explained by alcohol intoxication.  Gait abnormality would not be associated with this finding.  He is not convinced that this represents any dangerous process.  He recommends discharge with outpatient neurology follow-up.  Patient currently at baseline , does endorse drinking alcohol today, reports that it was the first time he had drank alcohol in a while, and he was intoxicated.  He has no neurologic deficits at this time.  Discussed results, he feels comfortable going home and following up with neurology as an outpatient.  He is clinically sober and stable for discharge. Will discharge patient to home. All questions answered. Patient comfortable with plan of discharge. Return precautions discussed with patient and specified on the after visit summary.  [WS]    Clinical Course User Index [WS] Lonell Grandchild, MD     Additional history obtained: -Additional history obtained from ems -External records from outside source obtained and reviewed including: Chart review including previous notes, labs, imaging, consultation notes including prior notes    Lab Tests: -I ordered, reviewed, and interpreted labs.   The pertinent results include:   Labs Reviewed  ETHANOL - Abnormal; Notable for the following components:      Result Value   Alcohol, Ethyl (B) 167 (*)    All other components within normal limits  PROTIME-INR - Abnormal; Notable for the following components:   Prothrombin  Time 15.3 (*)    All other components within normal limits  CBC - Abnormal; Notable for the following components:   WBC 15.0 (*)    All other components within normal limits  DIFFERENTIAL - Abnormal; Notable for the following components:   Lymphs Abs 12.3 (*)    All other components within normal limits  COMPREHENSIVE METABOLIC PANEL WITH GFR - Abnormal; Notable for the following components:   Creatinine, Ser 1.36 (*)    GFR, Estimated 57 (*)    All other components within normal limits  URINALYSIS, ROUTINE W REFLEX MICROSCOPIC - Abnormal; Notable for the following components:   Color, Urine STRAW (*)    Specific Gravity, Urine 1.002 (*)    All other components within normal limits  I-STAT CHEM 8, ED - Abnormal; Notable for the following components:   Creatinine, Ser 1.60 (*)    Calcium, Ion 1.11 (*)    All other components within normal limits  APTT  RAPID URINE DRUG SCREEN, HOSP PERFORMED  PATHOLOGIST SMEAR REVIEW  I-STAT CHEM 8, ED    Notable for positive alcohol level   EKG   EKG Interpretation Date/Time:  Friday August 28 2023 16:47:28 EDT Ventricular Rate:  79 PR Interval:  207 QRS Duration:  89 QT Interval:  369 QTC Calculation: 423 R Axis:   21  Text Interpretation: Sinus rhythm Abnormal R-wave progression, early transition Confirmed by Alvino Blood ([id number]) on 08/28/2023 4:54:16 PM         Imaging Studies ordered: I ordered imaging studies including MRI brain On my interpretation imaging demonstrates possible punctate infarct  I independently visualized and interpreted imaging. I agree with the radiologist interpretation   Medicines ordered and prescription drug management: Meds ordered this encounter  Medications  iohexol (OMNIPAQUE) 350 MG/ML injection 60 mL   DISCONTD: nicotine (NICODERM CQ - dosed in mg/24 hours) patch 21 mg    -I have reviewed the patients home medicines and have made adjustments as needed   Consultations Obtained: I  requested consultation with the neurologist Dr. Wilford Corner,  and discussed lab and imaging findings as well as pertinent plan - they recommend: symptoms not consistent with MRI finding, does not recommend patient admitted for further workup     Social Determinants of Health:  Diagnosis or treatment significantly limited by social determinants of health: alcohol use   Reevaluation: After the interventions noted above, I reevaluated the patient and found that their symptoms have resolved  Co morbidities that complicate the patient evaluation  Past Medical History:  Diagnosis Date   Chickenpox    Chickenpox    Heat stroke    Hepatitis C    Stroke [facility])       Dispostion: Disposition decision including need for hospitalization was considered, and patient discharged from emergency department.    Final Clinical Impression(s) / ED Diagnoses Final diagnoses:  Alcoholic intoxication without complication (HCC)  History of stroke     This chart was dictated using voice recognition software.  Despite best efforts to proofread,  errors can occur which can change the documentation meaning.    Lonell Grandchild, MD 08/31/23 [phone]

## 2023-08-28 NOTE — ED Notes (Signed)
 Another alerted me that this patient stated he was leaving. Patient attempted to walk out the EMS bay. Asked patient to wait to be seen by the DR as he had just ordered a MRI. Patient then walked toward the waiting room where security met him. Wife was also contacted to make her aware of the situation. Patient was unwilling to speak to his wife. MD and security convinced the patient to  go back to his room to have the MRI done. MRI department was called to see if his imaging could be expedited.

## 2023-08-28 NOTE — ED Notes (Signed)
 Called patients spouse and children. No answer from his children. Wife is aware the patient is going home. Patient states he will call a cab for transport.

## 2023-08-28 NOTE — Discharge Instructions (Addendum)
 We evaluated you for your trouble walking.  We believe your symptoms were due to alcohol intoxication.  We obtained an MRI of your brain, there was a tiny spot that could represent a stroke, but the neurologist does not think that this is related to your symptoms.  He recommends that you follow-up with neurology outside the hospital.  We have placed a neurology referral.  Please return to the emergency department if you develop any new or worsening symptoms such as facial droop, trouble walking, numbness or tingling, trouble swallowing, vision changes, weakness on one side of the other, or any other new symptoms.  Please do not drink alcohol as it can be dangerous to your health.

## 2023-08-28 NOTE — ED Notes (Signed)
 Patient transported to MRI

## 2023-08-31 LAB — PATHOLOGIST SMEAR REVIEW

## 2023-09-01 ENCOUNTER — Telehealth: Payer: Self-pay

## 2023-09-01 NOTE — Transitions of Care (Post Inpatient/ED Visit) (Signed)
   09/01/2023  Name: Ryan Montgomery MRN: [id number] DOB: December 19, 1955  Today's TOC FU Call Status: Today's TOC FU Call Status:: Successful TOC FU Call Completed TOC FU Call Complete Date: 09/01/23 Patient's Name and Date of Birth confirmed.  Transition Care Management Follow-up Telephone Call Date of Discharge: 08/31/23 Discharge Facility: [facility]) Type of Discharge: Emergency Department Reason for ED Visit: Other: (alcohol use) How have you been since you were released from the hospital?: Better Any questions or concerns?: No  Items Reviewed: Did you receive and understand the discharge instructions provided?: Yes Medications obtained,verified, and reconciled?: Yes (Medications Reviewed) Any new allergies since your discharge?: No Dietary orders reviewed?: Yes Do you have support at home?: No  Medications Reviewed Today: Medications Reviewed Today     Reviewed by Karena Addison, LPN (Licensed Practical Nurse) on 09/01/23 at 1501  Med List Status: <None>   Medication Order Taking? Sig Documenting Provider Last Dose Status Informant  amoxicillin-clavulanate (AUGMENTIN) 875-125 MG tablet [id number]  Take 1 tablet by mouth 2 (two) times daily. Nelwyn Salisbury, MD  Active   aspirin EC 81 MG EC tablet [id number] No Take 1 tablet (81 mg total) by mouth daily. Swallow whole. Tyrone Nine, MD Taking Active   atorvastatin (LIPITOR) 20 MG tablet [id number] No Take 1 tablet (20 mg total) by mouth daily. Tyrone Nine, MD Taking Active   celecoxib (CELEBREX) 200 MG capsule [id number] No Take 1 capsule (200 mg total) by mouth 2 (two) times daily as needed for moderate pain (pain score 4-6). Nelwyn Salisbury, MD Taking Active   metoprolol succinate (TOPROL-XL) 25 MG 24 hr tablet [id number] No Take 1 tablet (25 mg total) by mouth daily. Nelwyn Salisbury, MD Taking Active   tamsulosin [facility]) 0.4 MG CAPS capsule [id number] No Take 1 capsule (0.4 mg total) by mouth daily after supper. Melene Plan, DO  Taking Active Spouse/Significant Other            Home Care and Equipment/Supplies: Were Home Health Services Ordered?: NA Any new equipment or medical supplies ordered?: NA  Functional Questionnaire: Do you need assistance with bathing/showering or dressing?: No Do you need assistance with meal preparation?: No Do you need assistance with eating?: No Do you have difficulty maintaining continence: No Do you need assistance with getting out of bed/getting out of a chair/moving?: No Do you have difficulty managing or taking your medications?: No  Follow up appointments reviewed: PCP Follow-up appointment confirmed?: No (declined) MD Provider Line [phone] Given: No Specialist Hospital Follow-up appointment confirmed?: NA Do you need transportation to your follow-up appointment?: No Do you understand care options if your condition(s) worsen?: Yes-patient verbalized understanding    SIGNATURE Karena Addison, LPN [facility] Nurse Health Advisor Direct Dial [phone]

## 2023-09-26 ENCOUNTER — Inpatient Hospital Stay (HOSPITAL_COMMUNITY)

## 2023-09-26 ENCOUNTER — Encounter (HOSPITAL_COMMUNITY): Payer: Self-pay

## 2023-09-26 ENCOUNTER — Emergency Department (HOSPITAL_COMMUNITY)

## 2023-09-26 ENCOUNTER — Inpatient Hospital Stay (HOSPITAL_COMMUNITY)
Admission: EM | Admit: 2023-09-26 | Discharge: 2023-10-01 | DRG: 064 | Disposition: A | Discharge status: Skilled Nursing Facility | Attending: Internal Medicine | Admitting: Internal Medicine

## 2023-09-26 ENCOUNTER — Other Ambulatory Visit: Payer: Self-pay

## 2023-09-26 DIAGNOSIS — R4781 Slurred speech: Secondary | ICD-10-CM | POA: Diagnosis not present

## 2023-09-26 DIAGNOSIS — J440 Chronic obstructive pulmonary disease with acute lower respiratory infection: Secondary | ICD-10-CM | POA: Diagnosis not present

## 2023-09-26 DIAGNOSIS — I6389 Other cerebral infarction: Secondary | ICD-10-CM | POA: Diagnosis not present

## 2023-09-26 DIAGNOSIS — I69320 Aphasia following cerebral infarction: Secondary | ICD-10-CM | POA: Diagnosis not present

## 2023-09-26 DIAGNOSIS — Z91128 Patient's intentional underdosing of medication regimen for other reason: Secondary | ICD-10-CM

## 2023-09-26 DIAGNOSIS — J40 Bronchitis, not specified as acute or chronic: Secondary | ICD-10-CM | POA: Diagnosis not present

## 2023-09-26 DIAGNOSIS — F10139 Alcohol abuse with withdrawal, unspecified: Secondary | ICD-10-CM | POA: Diagnosis not present

## 2023-09-26 DIAGNOSIS — Z91148 Patient's other noncompliance with medication regimen for other reason: Secondary | ICD-10-CM

## 2023-09-26 DIAGNOSIS — H53462 Homonymous bilateral field defects, left side: Secondary | ICD-10-CM | POA: Diagnosis present

## 2023-09-26 DIAGNOSIS — R29810 Facial weakness: Secondary | ICD-10-CM | POA: Diagnosis not present

## 2023-09-26 DIAGNOSIS — J209 Acute bronchitis, unspecified: Secondary | ICD-10-CM | POA: Diagnosis present

## 2023-09-26 DIAGNOSIS — E785 Hyperlipidemia, unspecified: Secondary | ICD-10-CM | POA: Diagnosis present

## 2023-09-26 DIAGNOSIS — T466X6A Underdosing of antihyperlipidemic and antiarteriosclerotic drugs, initial encounter: Secondary | ICD-10-CM | POA: Diagnosis present

## 2023-09-26 DIAGNOSIS — F17210 Nicotine dependence, cigarettes, uncomplicated: Secondary | ICD-10-CM | POA: Diagnosis not present

## 2023-09-26 DIAGNOSIS — J441 Chronic obstructive pulmonary disease with (acute) exacerbation: Secondary | ICD-10-CM | POA: Diagnosis present

## 2023-09-26 DIAGNOSIS — Z743 Need for continuous supervision: Secondary | ICD-10-CM | POA: Diagnosis not present

## 2023-09-26 DIAGNOSIS — G40909 Epilepsy, unspecified, not intractable, without status epilepticus: Secondary | ICD-10-CM | POA: Diagnosis not present

## 2023-09-26 DIAGNOSIS — T39016A Underdosing of aspirin, initial encounter: Secondary | ICD-10-CM | POA: Diagnosis not present

## 2023-09-26 DIAGNOSIS — Y906 Blood alcohol level of 120-199 mg/100 ml: Secondary | ICD-10-CM | POA: Diagnosis present

## 2023-09-26 DIAGNOSIS — I10 Essential (primary) hypertension: Secondary | ICD-10-CM | POA: Diagnosis present

## 2023-09-26 DIAGNOSIS — N179 Acute kidney failure, unspecified: Secondary | ICD-10-CM | POA: Diagnosis not present

## 2023-09-26 DIAGNOSIS — R262 Difficulty in walking, not elsewhere classified: Secondary | ICD-10-CM | POA: Diagnosis not present

## 2023-09-26 DIAGNOSIS — M109 Gout, unspecified: Secondary | ICD-10-CM | POA: Diagnosis not present

## 2023-09-26 DIAGNOSIS — I471 Supraventricular tachycardia, unspecified: Secondary | ICD-10-CM | POA: Diagnosis not present

## 2023-09-26 DIAGNOSIS — G9341 Metabolic encephalopathy: Secondary | ICD-10-CM | POA: Diagnosis not present

## 2023-09-26 DIAGNOSIS — I77810 Thoracic aortic ectasia: Secondary | ICD-10-CM | POA: Diagnosis present

## 2023-09-26 DIAGNOSIS — I63511 Cerebral infarction due to unspecified occlusion or stenosis of right middle cerebral artery: Secondary | ICD-10-CM | POA: Diagnosis not present

## 2023-09-26 DIAGNOSIS — R404 Transient alteration of awareness: Secondary | ICD-10-CM | POA: Diagnosis not present

## 2023-09-26 DIAGNOSIS — Z7902 Long term (current) use of antithrombotics/antiplatelets: Secondary | ICD-10-CM

## 2023-09-26 DIAGNOSIS — F1010 Alcohol abuse, uncomplicated: Secondary | ICD-10-CM | POA: Diagnosis present

## 2023-09-26 DIAGNOSIS — Z6830 Body mass index (BMI) 30.0-30.9, adult: Secondary | ICD-10-CM | POA: Diagnosis not present

## 2023-09-26 DIAGNOSIS — I679 Cerebrovascular disease, unspecified: Secondary | ICD-10-CM | POA: Diagnosis not present

## 2023-09-26 DIAGNOSIS — I639 Cerebral infarction, unspecified: Secondary | ICD-10-CM | POA: Diagnosis not present

## 2023-09-26 DIAGNOSIS — Z720 Tobacco use: Secondary | ICD-10-CM | POA: Diagnosis not present

## 2023-09-26 DIAGNOSIS — R6889 Other general symptoms and signs: Secondary | ICD-10-CM | POA: Diagnosis not present

## 2023-09-26 DIAGNOSIS — Z79899 Other long term (current) drug therapy: Secondary | ICD-10-CM

## 2023-09-26 DIAGNOSIS — G936 Cerebral edema: Secondary | ICD-10-CM | POA: Diagnosis not present

## 2023-09-26 DIAGNOSIS — E66811 Obesity, class 1: Secondary | ICD-10-CM | POA: Diagnosis present

## 2023-09-26 DIAGNOSIS — Z7982 Long term (current) use of aspirin: Secondary | ICD-10-CM

## 2023-09-26 DIAGNOSIS — R55 Syncope and collapse: Secondary | ICD-10-CM | POA: Diagnosis not present

## 2023-09-26 DIAGNOSIS — F17200 Nicotine dependence, unspecified, uncomplicated: Secondary | ICD-10-CM | POA: Diagnosis not present

## 2023-09-26 DIAGNOSIS — M1000 Idiopathic gout, unspecified site: Secondary | ICD-10-CM | POA: Diagnosis not present

## 2023-09-26 DIAGNOSIS — R29704 NIHSS score 4: Secondary | ICD-10-CM | POA: Diagnosis present

## 2023-09-26 DIAGNOSIS — I77819 Aortic ectasia, unspecified site: Secondary | ICD-10-CM | POA: Diagnosis not present

## 2023-09-26 DIAGNOSIS — R278 Other lack of coordination: Secondary | ICD-10-CM | POA: Diagnosis not present

## 2023-09-26 DIAGNOSIS — J449 Chronic obstructive pulmonary disease, unspecified: Secondary | ICD-10-CM | POA: Diagnosis not present

## 2023-09-26 DIAGNOSIS — R296 Repeated falls: Secondary | ICD-10-CM | POA: Diagnosis not present

## 2023-09-26 DIAGNOSIS — M6281 Muscle weakness (generalized): Secondary | ICD-10-CM | POA: Diagnosis not present

## 2023-09-26 LAB — BASIC METABOLIC PANEL WITH GFR
Anion gap: 8 (ref 5–15)
BUN: 11 mg/dL (ref 8–23)
CO2: 21 mmol/L — ABNORMAL LOW (ref 22–32)
Calcium: 9 mg/dL (ref 8.9–10.3)
Chloride: 109 mmol/L (ref 98–111)
Creatinine, Ser: 1.22 mg/dL (ref 0.61–1.24)
GFR, Estimated: >60 mL/min (ref >60)
Glucose, Bld: 89 mg/dL (ref 70–99)
Potassium: 4 mmol/L (ref 3.5–5.1)
Sodium: 138 mmol/L (ref 135–145)

## 2023-09-26 LAB — CBC WITH DIFFERENTIAL/PLATELET
Abs Immature Granulocytes: 0.02 10*3/uL (ref 0.00–0.07)
Basophils Absolute: 0 10*3/uL (ref 0.0–0.1)
Basophils Relative: 0 %
Eosinophils Absolute: 0.1 10*3/uL (ref 0.0–0.5)
Eosinophils Relative: 1 %
HCT: 41.5 % (ref 39.0–52.0)
Hemoglobin: 13.5 g/dL (ref 13.0–17.0)
Immature Granulocytes: 0 %
Lymphocytes Relative: 58 %
Lymphs Abs: 5.7 10*3/uL — ABNORMAL HIGH (ref 0.7–4.0)
MCH: 31.1 pg (ref 26.0–34.0)
MCHC: 32.5 g/dL (ref 30.0–36.0)
MCV: 95.6 fL (ref 80.0–100.0)
Monocytes Absolute: 0.7 10*3/uL (ref 0.1–1.0)
Monocytes Relative: 7 %
Neutro Abs: 3.4 10*3/uL (ref 1.7–7.7)
Neutrophils Relative %: 34 %
Platelets: 179 10*3/uL (ref 150–400)
RBC: 4.34 MIL/uL (ref 4.22–5.81)
RDW: 13 % (ref 11.5–15.5)
WBC Morphology: ABNORMAL
WBC: 9.9 10*3/uL (ref 4.0–10.5)
nRBC: 0 % (ref 0.0–0.2)

## 2023-09-26 LAB — GLUCOSE, CAPILLARY: Glucose-Capillary: 79 mg/dL (ref 70–99)

## 2023-09-26 LAB — HEMOGLOBIN A1C
Hgb A1c MFr Bld: 4.4 % — ABNORMAL LOW (ref 4.8–5.6)
Mean Plasma Glucose: 79.58 mg/dL

## 2023-09-26 LAB — CBG MONITORING, ED: Glucose-Capillary: 93 mg/dL (ref 70–99)

## 2023-09-26 MED ORDER — ACETAMINOPHEN 325 MG PO TABS
650.0000 mg | ORAL_TABLET | Freq: Four times a day (QID) | ORAL | Status: DC | PRN
  Administered 2023-09-27 – 2023-09-28 (×4): 650 mg via ORAL
  Filled 2023-09-26 (×4): qty 2

## 2023-09-26 MED ORDER — ONDANSETRON HCL 4 MG PO TABS: 4.0000 mg | ORAL_TABLET | Freq: Four times a day (QID) | ORAL | Status: DC | PRN

## 2023-09-26 MED ORDER — ONDANSETRON HCL 4 MG/2ML IJ SOLN
4.0000 mg | Freq: Four times a day (QID) | INTRAMUSCULAR | Status: DC | PRN
Start: 2023-09-26 — End: 2023-10-01

## 2023-09-26 MED ORDER — STROKE: EARLY STAGES OF RECOVERY BOOK
Freq: Once | Status: AC
  Filled 2023-09-26: qty 1

## 2023-09-26 MED ORDER — ACETAMINOPHEN 650 MG RE SUPP: 650.0000 mg | Freq: Four times a day (QID) | RECTAL | Status: DC | PRN

## 2023-09-26 MED ORDER — IOHEXOL 350 MG/ML SOLN
75.0000 mL | Freq: Once | INTRAVENOUS | Status: AC | PRN
  Administered 2023-09-26: 75 mL via INTRAVENOUS

## 2023-09-26 MED ORDER — CLOPIDOGREL BISULFATE 75 MG PO TABS
75.0000 mg | ORAL_TABLET | Freq: Every day | ORAL | Status: DC
  Administered 2023-09-27 – 2023-10-01 (×6): 75 mg via ORAL
  Filled 2023-09-26 (×6): qty 1

## 2023-09-26 MED ORDER — ENOXAPARIN SODIUM 40 MG/0.4ML IJ SOSY
40.0000 mg | PREFILLED_SYRINGE | INTRAMUSCULAR | Status: DC
  Administered 2023-09-27 – 2023-09-30 (×5): 40 mg via SUBCUTANEOUS
  Filled 2023-09-26 (×5): qty 0.4

## 2023-09-26 MED ORDER — ASPIRIN 81 MG PO TBEC
81.0000 mg | DELAYED_RELEASE_TABLET | Freq: Every day | ORAL | Status: DC
  Administered 2023-09-27 – 2023-10-01 (×6): 81 mg via ORAL
  Filled 2023-09-26 (×6): qty 1

## 2023-09-26 MED ORDER — MORPHINE SULFATE (PF) 4 MG/ML IV SOLN
4.0000 mg | Freq: Once | INTRAVENOUS | Status: AC
  Administered 2023-09-26: 4 mg via INTRAVENOUS
  Filled 2023-09-26: qty 1

## 2023-09-26 NOTE — ED Notes (Signed)
 Urine sample sent to lab if ordered.

## 2023-09-26 NOTE — ED Triage Notes (Signed)
 Pt brought in by daughter for delayed/slowed speech that started this AM. LKN is 1500 yesterday, hx of CVA. Pt daughter reports speech seems normal in triage. Left sided limb ataxia and left sided gaze issue. No blood thinners. C/o headache when he coughs or sneezes.

## 2023-09-26 NOTE — ED Provider Notes (Signed)
 [place] EMERGENCY DEPARTMENT AT [facility] Provider Note   CSN: [id number] Arrival date & time: 09/26/23  1143     History  Chief Complaint  Patient presents with   Aphasia    Ryan Montgomery is a 68 y.o. male.  68 year old male presents here with concern for change in his speech pattern.  Patient is have a history of CVA in the past and was left with partial aphasia because of it.  According to daughter who is at bedside she spoke to the patient on phone today when she noted his speech sounded slow.  Relatives were also at home who confirmed this.  Patient denying any new weakness in his arms or legs.  No severe headaches or emesis with this.  No new medications.  Symptoms have since resolved.  Patient stated that he was not confused during the event and daughter agrees.  She denies any dysarthria but notes that his voice just sounded slow.  No treatment use for this prior to arrival       Home Medications Prior to Admission medications   Medication Sig Start Date End Date Taking? Authorizing Provider  amoxicillin -clavulanate (AUGMENTIN ) 875-125 MG tablet Take 1 tablet by mouth 2 (two) times daily. 06/24/23   Donley Furth, MD  aspirin  EC 81 MG EC tablet Take 1 tablet (81 mg total) by mouth daily. Swallow whole. 06/14/21   Wynetta Heckle, MD  atorvastatin  (LIPITOR) 20 MG tablet Take 1 tablet (20 mg total) by mouth daily. 06/14/21   Wynetta Heckle, MD  celecoxib  (CELEBREX ) 200 MG capsule Take 1 capsule (200 mg total) by mouth 2 (two) times daily as needed for moderate pain (pain score 4-6). 03/30/23   Donley Furth, MD  metoprolol  succinate (TOPROL -XL) 25 MG 24 hr tablet Take 1 tablet (25 mg total) by mouth daily. 04/27/23   Donley Furth, MD  tamsulosin  (FLOMAX ) 0.4 MG CAPS capsule Take 1 capsule (0.4 mg total) by mouth daily after supper. 12/28/20   Albertus Hughs, DO      Allergies    Moxifloxacin    Review of Systems   Review of Systems  All other systems reviewed and  are negative.   Physical Exam Updated Vital Signs BP (!) 177/116 (BP Location: Left Arm)   Pulse 92   Temp 98.4 F (36.9 C) (Oral)   Resp 17   Ht 1.803 m (5\' 11" )   Wt 99.8 kg   SpO2 100%   BMI 30.68 kg/m  Physical Exam Vitals and nursing note reviewed.  Constitutional:      General: He is not in acute distress.    Appearance: Normal appearance. He is well-developed. He is not toxic-appearing.  HENT:     Head: Normocephalic and atraumatic.  Eyes:     General: Lids are normal.     Conjunctiva/sclera: Conjunctivae normal.     Pupils: Pupils are equal, round, and reactive to light.  Neck:     Thyroid: No thyroid mass.     Trachea: No tracheal deviation.  Cardiovascular:     Rate and Rhythm: Normal rate and regular rhythm.     Heart sounds: Normal heart sounds. No murmur heard.    No gallop.  Pulmonary:     Effort: Pulmonary effort is normal. No respiratory distress.     Breath sounds: Normal breath sounds. No stridor. No decreased breath sounds, wheezing, rhonchi or rales.  Abdominal:     General: There is no distension.  Palpations: Abdomen is soft.     Tenderness: There is no abdominal tenderness. There is no rebound.  Musculoskeletal:        General: No tenderness. Normal range of motion.     Cervical back: Normal range of motion and neck supple.  Skin:    General: Skin is warm and dry.     Findings: No abrasion or rash.  Neurological:     General: No focal deficit present.     Mental Status: He is alert and oriented to person, place, and time. Mental status is at baseline.     GCS: GCS eye subscore is 4. GCS verbal subscore is 5. GCS motor subscore is 6.     Cranial Nerves: No cranial nerve deficit.     Sensory: No sensory deficit.     Motor: Motor function is intact.     Comments: Speech is normal.  Psychiatric:        Attention and Perception: Attention normal.        Speech: Speech normal.        Behavior: Behavior normal.     ED Results /  Procedures / Treatments   Labs (all labs ordered are listed, but only abnormal results are displayed) Labs Reviewed  CBC WITH DIFFERENTIAL/PLATELET  BASIC METABOLIC PANEL WITH GFR  CBG MONITORING, ED    EKG EKG Interpretation Date/Time:  Saturday Sep 26 2023 12:46:05 EDT Ventricular Rate:  81 PR Interval:  198 QRS Duration:  113 QT Interval:  367 QTC Calculation: 426 R Axis:   29  Text Interpretation: Sinus rhythm Borderline intraventricular conduction delay Confirmed by Lind Repine ([id number]) on 09/26/2023 1:55:21 PM  Radiology No results found.  Procedures Procedures    Medications Ordered in ED Medications - No data to display  ED Course/ Medical Decision Making/ A&P                                 Medical Decision Making Amount and/or Complexity of Data Reviewed Labs: ordered. Radiology: ordered. ECG/medicine tests: ordered.   EKG shows normal sinus rhythm.  Electrolytes and CBC without significant abnormality.  CT of the head shows acute to subacute infarct.  Discussed with Dr. Lindzen from neurology who request patient be admitted to [facility].  Patient is back to baseline neurologically at this time.  Therefore does not qualify for thrombolytics.  Will consult hospitalist       Final Clinical Impression(s) / ED Diagnoses Final diagnoses:  None    Rx / DC Orders ED Discharge Orders     None         Lind Repine, MD 09/26/23 1425

## 2023-09-26 NOTE — ED Notes (Signed)
 Please update daughter Ryan Montgomery with any changes. She is stepping out for lunch.

## 2023-09-26 NOTE — ED Notes (Signed)
 [facility] called.

## 2023-09-26 NOTE — Consult Note (Addendum)
 NEUROLOGY CONSULT NOTE   Date of service: Sep 26, 2023 Patient Name: Ryan Montgomery MRN:  [id number] DOB:  [birth year] Chief Complaint: "Stroke on MRI-presented with slow/slurred speech" Requesting Provider: Danice Dural, MD  History of Present Illness  Bejan Jacobs is a 68 y.o. 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  He said his last known well was somewhere around 2 or 3 PM yesterday on 09/25/2023.  When he spoke with his daughters over the phone, they noted the speech change and brought him to the hospital for further evaluation.  CT head done at [facility] ER where he presented showed a wedge-shaped subacute infarct in the right hemisphere. He was transferred over for further evaluation to [facility].  LKW: 3 PM on 09/25/2023 Modified rankin score: 2-Slight disability-UNABLE to perform all activities but does not need assistance IV Thrombolysis: Outside the window EVT: Stroke likely subacute and outside the window  NIHSS components Score: Comment  1a Level of Conscious 0[x]  1[]  2[]  3[]      1b LOC Questions 0[x]  1[]  2[]       1c LOC Commands 0[x]  1[]  2[]       2 Best Gaze 0[x]  1[]  2[]       3 Visual 0[]  1[]  2[x]  3[]      4 Facial Palsy 0[x]  1[]  2[]  3[]      5a Motor Arm - left 0[]  1[x]  2[]  3[]  4[]  UN[]    5b Motor Arm - Right 0[x]  1[]  2[]  3[]  4[]  UN[]    6a Motor Leg - Left 0[x]  1[]  2[]  3[]  4[]  UN[]    6b Motor Leg - Right 0[x]  1[]  2[]  3[]  4[]  UN[]    7 Limb Ataxia 0[x]  1[]  2[]  UN[]      8 Sensory 0[x]  1[]  2[]  UN[]      9 Best Language 0[x]  1[]  2[]  3[]      10 Dysarthria 0[]  1[x]  2[]  UN[]      11 Extinct. and Inattention 0[x]  1[]  2[]       TOTAL: 4      ROS  Comprehensive ROS performed and pertinent positives documented in HPI   Past History   Past Medical History:  Diagnosis Date   Chickenpox    Chickenpox    Heat  stroke    Hepatitis C    Stroke [facility])     Past Surgical History:  Procedure Laterality Date   BUBBLE STUDY  06/10/2021   Procedure: BUBBLE STUDY;  Surgeon: Harrold Lincoln, MD;  Location: [facility] ENDOSCOPY;  Service: Cardiovascular;;   colonoscopy  08-28-08   per Dr. Adan Holms, clear, repeat in 10 yrs    fx rt ankle     pins placed   JOINT REPLACEMENT Left 09/12/2019   LOOP RECORDER INSERTION N/A 06/10/2021   Procedure: LOOP RECORDER INSERTION;  Surgeon: Lei Pump, MD;  Location: [facility] INVASIVE CV LAB;  Service: Cardiovascular;  Laterality: N/A;   TEE WITHOUT CARDIOVERSION N/A 06/10/2021   Procedure: TRANSESOPHAGEAL ECHOCARDIOGRAM (TEE);  Surgeon: Harrold Lincoln, MD;  Location: [facility] ENDOSCOPY;  Service: Cardiovascular;  Laterality: N/A;   TOTAL HIP ARTHROPLASTY Left 09/12/2019   per Dr. Awanda Bogaert at [facility]     Family History: Family History  Problem Relation Age of Onset   Hypertension Other    Lung cancer Other     Social History  reports that he has been smoking cigarettes. He has never used smokeless  tobacco. He reports current alcohol use. He reports that he does not use drugs.  Allergies  Allergen Reactions   Moxifloxacin Hives    Medications   Current Facility-Administered Medications:    [START ON 09/27/2023]  stroke: early stages of recovery book, , Does not apply, Once, Danice Dural, MD   acetaminophen  (TYLENOL ) tablet 650 mg, 650 mg, Oral, Q6H PRN **OR** acetaminophen  (TYLENOL ) suppository 650 mg, 650 mg, Rectal, Q6H PRN, Danice Dural, MD   enoxaparin  (LOVENOX ) injection 40 mg, 40 mg, Subcutaneous, Q24H, Danice Dural, MD   ondansetron  (ZOFRAN ) tablet 4 mg, 4 mg, Oral, Q6H PRN **OR** ondansetron  (ZOFRAN ) injection 4 mg, 4 mg, Intravenous, Q6H PRN, Danice Dural, MD  Vitals   Vitals:   16-Oct-2023 1408 2023-10-16 1515 2023-10-16 1600 10-16-23 1817  BP:  (!) 181/99 (!) 191/95 (!) 172/95  Pulse:  77 80 78  Resp:  19 16 18    Temp: 98.4 F (36.9 C)   (!) 97.4 F (36.3 C)  TempSrc: Oral   Oral  SpO2:  97% 96% 96%  Weight:      Height:        Body mass index is 30.68 kg/m.  Physical Exam   General: Comfortably sleeping in bed, awakens to voice, participates with exam HEENT: Normocephalic atraumatic Lungs: Clear Cardiovascular: Regular rate rhythm Abdomen nondistended nontender Neurological exam Awake alert oriented once woken from sleep.  Able to tell me where he is, current month, his correct age. Speech is mildly dysarthric on longer sentences but he says that that is his baseline. No evidence of aphasia Diminished attention concentration Cranial nerves II to XII: Pupils equal round react light, extraocular movements intact, visual field examination shows a left homonymous hemianopsia, face appears grossly symmetric, tongue and palate midline. Motor examination reveals mild drift in the left upper extremity but he reports that he has a left rotator cuff injury that prevents him from lifting his arm up due to pain.  Otherwise no drift in the upper extremities Sensation intact to light touch Coordination examination reveals no gross dysmetria  Labs/Imaging/Neurodiagnostic studies   CBC:  Recent Labs  Lab 2023/10/16 1250  WBC 9.9  NEUTROABS 3.4  HGB 13.5  HCT 41.5  MCV 95.6  PLT 179   Basic Metabolic Panel:  Lab Results  Component Value Date   NA 138 2023/10/16   K 4.0 2023/10/16   CO2 21 (L) Oct 16, 2023   GLUCOSE 89 Oct 16, 2023   BUN 11 10/16/23   CREATININE 1.22 10-16-23   CALCIUM  9.0 10-16-23   GFRNONAA >60 2023/10/16   GFRAA >60 11/29/2019   Lipid Panel:  Lab Results  Component Value Date   LDLCALC 63 06/05/2021   HgbA1c:  Lab Results  Component Value Date   HGBA1C 4.7 (L) 06/05/2021   Urine Drug Screen:     Component Value Date/Time   LABOPIA NONE DETECTED 08/28/2023 1647   COCAINSCRNUR NONE DETECTED 08/28/2023 1647   LABBENZ NONE DETECTED 08/28/2023 1647    AMPHETMU NONE DETECTED 08/28/2023 1647   THCU NONE DETECTED 08/28/2023 1647   LABBARB NONE DETECTED 08/28/2023 1647    Alcohol Level     Component Value Date/Time   ETH 167 (H) 08/28/2023 1647   INR  Lab Results  Component Value Date   INR 1.2 08/28/2023   APTT  Lab Results  Component Value Date   APTT 30 08/28/2023    CT Head without contrast(Personally reviewed): Wedge-shaped area of peripheral edema with loss of gray-white  differentiation in the posterior right parieto-occipital region consistent with acute to subacute infarct.  No evidence of hemorrhagic transformation.  Stable appearance of old right frontal, bilateral cerebellar and bilateral basal ganglia lacunar infarctions as well as chronic microvascular disease  ASSESSMENT   Khadim Hayen is a 68 y.o. male with above past medical history including that of cryptogenic strokes status post loop recorder insertion, presents with speech changes and noted to have a right parieto-occipital region wedge-shaped infarct. The appearance of the stroke is suspicious for a embolic etiology. Workup as below  Impression: Right hemispheric acute to subacute ischemic infarct  RECOMMENDATIONS  Admit to hospitalist Frequent neurochecks Telemetry Aspirin  81+ Plavix  75 High intensity statin Loop recorder interrogation CT angiography head and neck MRI brain without contrast 2D echo A1c Lipid panel PT OT Speech therapy RN reports that he has passed follow screen-okay to have heart healthy diet Permissive hypertension for the next 48 to 72 hours.  Treat only if systolic blood pressures greater than 220 on a as needed basis.  After that, start normalizing blood pressures for an eventual goal of blood pressure to be normotension.  Avoid hypotension Stroke team to follow Plan was relayed to admitting Dr. Bonita Bussing by the daytime neurologist Dr. Renaee Caro.  Dr. Bonita Bussing is off shift at this time-secure chat not  sent. ______________________________________________________________________    Signed, Tona Francis, MD [facility]

## 2023-09-26 NOTE — ED Notes (Signed)
 Providers notified of pt symptoms and provider requested for MSE

## 2023-09-26 NOTE — Plan of Care (Signed)
 Problem: Education: Goal: Knowledge of disease or condition will improve 09/26/2023 2139 by Channie Comings, RN Outcome: Progressing 09/26/2023 2139 by Channie Comings, RN Outcome: Progressing Goal: Knowledge of secondary prevention will improve (MUST DOCUMENT ALL) 09/26/2023 2139 by Channie Comings, RN Outcome: Progressing 09/26/2023 2139 by Channie Comings, RN Outcome: Progressing Goal: Knowledge of patient specific risk factors will improve (DELETE if not current risk factor) 09/26/2023 2139 by Channie Comings, RN Outcome: Progressing 09/26/2023 2139 by Channie Comings, RN Outcome: Progressing   Problem: Ischemic Stroke/TIA Tissue Perfusion: Goal: Complications of ischemic stroke/TIA will be minimized 09/26/2023 2139 by Channie Comings, RN Outcome: Progressing 09/26/2023 2139 by Channie Comings, RN Outcome: Progressing   Problem: Coping: Goal: Will verbalize positive feelings about self 09/26/2023 2139 by Channie Comings, RN Outcome: Progressing 09/26/2023 2139 by Channie Comings, RN Outcome: Progressing Goal: Will identify appropriate support needs 09/26/2023 2139 by Channie Comings, RN Outcome: Progressing 09/26/2023 2139 by Channie Comings, RN Outcome: Progressing   Problem: Health Behavior/Discharge Planning: Goal: Ability to manage health-related needs will improve 09/26/2023 2139 by Channie Comings, RN Outcome: Progressing 09/26/2023 2139 by Channie Comings, RN Outcome: Progressing Goal: Goals will be collaboratively established with patient/family 09/26/2023 2139 by Channie Comings, RN Outcome: Progressing 09/26/2023 2139 by Channie Comings, RN Outcome: Progressing   Problem: Self-Care: Goal: Ability to participate in self-care as condition permits will improve 09/26/2023 2139 by Channie Comings, RN Outcome: Progressing 09/26/2023 2139 by Channie Comings, RN Outcome: Progressing Goal: Verbalization of feelings and concerns over  difficulty with self-care will improve 09/26/2023 2139 by Channie Comings, RN Outcome: Progressing 09/26/2023 2139 by Channie Comings, RN Outcome: Progressing Goal: Ability to communicate needs accurately will improve 09/26/2023 2139 by Channie Comings, RN Outcome: Progressing 09/26/2023 2139 by Channie Comings, RN Outcome: Progressing   Problem: Nutrition: Goal: Risk of aspiration will decrease 09/26/2023 2139 by Channie Comings, RN Outcome: Progressing 09/26/2023 2139 by Channie Comings, RN Outcome: Progressing Goal: Dietary intake will improve 09/26/2023 2139 by Channie Comings, RN Outcome: Progressing 09/26/2023 2139 by Channie Comings, RN Outcome: Progressing   Problem: Education: Goal: Knowledge of General Education information will improve Description: Including pain rating scale, medication(s)/side effects and non-pharmacologic comfort measures 09/26/2023 2139 by Channie Comings, RN Outcome: Progressing 09/26/2023 2139 by Channie Comings, RN Outcome: Progressing   Problem: Health Behavior/Discharge Planning: Goal: Ability to manage health-related needs will improve 09/26/2023 2139 by Channie Comings, RN Outcome: Progressing 09/26/2023 2139 by Channie Comings, RN Outcome: Progressing   Problem: Clinical Measurements: Goal: Ability to maintain clinical measurements within normal limits will improve 09/26/2023 2139 by Channie Comings, RN Outcome: Progressing 09/26/2023 2139 by Channie Comings, RN Outcome: Progressing Goal: Will remain free from infection 09/26/2023 2139 by Channie Comings, RN Outcome: Progressing 09/26/2023 2139 by Channie Comings, RN Outcome: Progressing Goal: Diagnostic test results will improve 09/26/2023 2139 by Channie Comings, RN Outcome: Progressing 09/26/2023 2139 by Channie Comings, RN Outcome: Progressing Goal: Respiratory complications will improve 09/26/2023 2139 by Channie Comings, RN Outcome:  Progressing 09/26/2023 2139 by Channie Comings, RN Outcome: Progressing Goal: Cardiovascular complication will be avoided 09/26/2023 2139 by Channie Comings, RN Outcome: Progressing 09/26/2023 2139 by Channie Comings, RN Outcome: Progressing   Problem: Activity: Goal: Risk for activity intolerance will decrease 09/26/2023 2139 by Channie Comings, RN Outcome: Progressing 09/26/2023 2139 by Channie Comings, RN Outcome: Progressing   Problem: Nutrition: Goal: Adequate nutrition will be maintained 09/26/2023 2139 by Channie Comings, RN Outcome: Progressing 09/26/2023 2139 by Channie Comings, RN Outcome: Progressing   Problem: Coping: Goal: Level of anxiety will decrease  09/26/2023 2139 by Channie Comings, RN Outcome: Progressing 09/26/2023 2139 by Channie Comings, RN Outcome: Progressing   Problem: Elimination: Goal: Will not experience complications related to bowel motility 09/26/2023 2139 by Channie Comings, RN Outcome: Progressing 09/26/2023 2139 by Channie Comings, RN Outcome: Progressing Goal: Will not experience complications related to urinary retention 09/26/2023 2139 by Channie Comings, RN Outcome: Progressing 09/26/2023 2139 by Channie Comings, RN Outcome: Progressing   Problem: Pain Managment: Goal: General experience of comfort will improve and/or be controlled 09/26/2023 2139 by Channie Comings, RN Outcome: Progressing 09/26/2023 2139 by Channie Comings, RN Outcome: Progressing   Problem: Safety: Goal: Ability to remain free from injury will improve 09/26/2023 2139 by Channie Comings, RN Outcome: Progressing 09/26/2023 2139 by Channie Comings, RN Outcome: Progressing   Problem: Skin Integrity: Goal: Risk for impaired skin integrity will decrease 09/26/2023 2139 by Channie Comings, RN Outcome: Progressing 09/26/2023 2139 by Channie Comings, RN Outcome: Progressing

## 2023-09-26 NOTE — H&P (Signed)
 History and Physical    Patient: Ryan Montgomery QMV:[id number] DOB: [birth year] DOA: 09/26/2023 DOS: the patient was seen and examined on 09/26/2023 PCP: Donley Furth, MD  Patient coming from: Home  Chief Complaint:  Chief Complaint  Patient presents with   Aphasia   HPI: Ryan Montgomery is a 68 y.o. male with medical history significant of herpes zoster, heatstroke, hepatitis C, history of CVA, hypertension, rib fractures, aortic dilatation, history of AKI, chronic left hip pain, chronic left foot pain, tobacco abuse, alcohol abuse, gout, history of syncopal episode, history of seizure who presented to the emergency department with his daughter due to delay/slow speech that started earlier in the morning.  LKN was around 1500 yesterday.  Per daughter, his speech has returned to normal.  He denied fever, chills, rhinorrhea, sore throat, wheezing or hemoptysis.  No chest pain, palpitations, diaphoresis, PND, orthopnea or pitting edema of the lower extremities.  No abdominal pain, nausea, emesis, diarrhea, constipation, melena or hematochezia.  No flank pain, dysuria, frequency or hematuria.  No polyuria, polydipsia, polyphagia or blurred vision.  He has not been taking aspirin  or atorvastatin .  Imaging: CBC showed white count 9.9, hemoglobin 13.5 g/dL and platelets 284.  BMP was normal with exception of a CO2 of 21 mmol/L.  Imaging: CT head without contrast showing a wedge-shaped area of peripheral edema with loss of gray-white differentiation in the posterior right.  The suprasellar region is consistent with acute to subacute infarct.  No evidence for hemorrhagic transformation.  No midline shift.  Stable appearance of old right frontal infarct, bilateral cerebellar infarcts, basal ganglia lacunar infarcts and chronic microvascular ischemic demyelination.  ED course: Initial vital signs were temperature 98.1 F, pulse 92, respirations 17, BP 177/116 mmHg O2 sat 100% on room air.  The patient  received morphine  4 mg IVP x 1.   Review of Systems: As mentioned in the history of present illness. All other systems reviewed and are negative. Past Medical History:  Diagnosis Date   Chickenpox    Chickenpox    Heat stroke    Hepatitis C    Stroke [facility])    Past Surgical History:  Procedure Laterality Date   BUBBLE STUDY  06/10/2021   Procedure: BUBBLE STUDY;  Surgeon: Harrold Lincoln, MD;  Location: [facility] ENDOSCOPY;  Service: Cardiovascular;;   colonoscopy  08-28-08   per Dr. Adan Holms, clear, repeat in 10 yrs    fx rt ankle     pins placed   JOINT REPLACEMENT Left 09/12/2019   LOOP RECORDER INSERTION N/A 06/10/2021   Procedure: LOOP RECORDER INSERTION;  Surgeon: Lei Pump, MD;  Location: [facility] INVASIVE CV LAB;  Service: Cardiovascular;  Laterality: N/A;   TEE WITHOUT CARDIOVERSION N/A 06/10/2021   Procedure: TRANSESOPHAGEAL ECHOCARDIOGRAM (TEE);  Surgeon: Harrold Lincoln, MD;  Location: [facility] ENDOSCOPY;  Service: Cardiovascular;  Laterality: N/A;   TOTAL HIP ARTHROPLASTY Left 09/12/2019   per Dr. Awanda Bogaert at [facility]    Social History:  reports that he has been smoking cigarettes. He has never used smokeless tobacco. He reports current alcohol use. He reports that he does not use drugs.  Allergies  Allergen Reactions   Moxifloxacin Hives    Family History  Problem Relation Age of Onset   Hypertension Other    Lung cancer Other     Prior to Admission medications   Medication Sig Start Date End Date Taking? Authorizing Provider  ADVIL  200 MG tablet Take 200-400 mg by mouth every 6 (six)  hours as needed (for sinus headaches or headaches).   Yes [provider]  celecoxib  (CELEBREX ) 200 MG capsule Take 1 capsule (200 mg total) by mouth 2 (two) times daily as needed for moderate pain (pain score 4-6). 03/30/23  Yes Donley Furth, MD  Dextran 70-Hypromellose (ARTIFICIAL TEARS PF OP) Place 1 drop into both eyes 3 (three) times daily as needed (for  dryness).   Yes [provider]  loratadine (CLARITIN) 10 MG tablet Take 10 mg by mouth daily as needed for allergies or rhinitis.   Yes [provider]  metoprolol  succinate (TOPROL -XL) 25 MG 24 hr tablet Take 1 tablet (25 mg total) by mouth daily. 04/27/23  Yes Donley Furth, MD  TYLENOL  500 MG tablet Take 500-1,000 mg by mouth every 6 (six) hours as needed (for pain).   Yes [provider]  amoxicillin -clavulanate (AUGMENTIN ) 875-125 MG tablet Take 1 tablet by mouth 2 (two) times daily. Patient not taking: Reported on 09/26/2023 06/24/23   Donley Furth, MD  aspirin  EC 81 MG EC tablet Take 1 tablet (81 mg total) by mouth daily. Swallow whole. Patient not taking: Reported on 09/26/2023 06/14/21   Wynetta Heckle, MD  atorvastatin  (LIPITOR) 20 MG tablet Take 1 tablet (20 mg total) by mouth daily. Patient not taking: Reported on 09/26/2023 06/14/21   Wynetta Heckle, MD  tamsulosin  (FLOMAX ) 0.4 MG CAPS capsule Take 1 capsule (0.4 mg total) by mouth daily after supper. Patient not taking: Reported on 09/26/2023 12/28/20   Albertus Hughs, DO    Physical Exam: Vitals:   09/26/23 1230 09/26/23 1245 09/26/23 1405 09/26/23 1408  BP: (!) 166/89 (!) 172/91 (!) 158/107   Pulse: 88 82 77   Resp: (!) 22 16 20    Temp:    98.4 F (36.9 C)  TempSrc:    Oral  SpO2: 98% 98% 97%   Weight:      Height:       Physical Exam Vitals and nursing note reviewed.  Constitutional:      General: He is awake. He is not in acute distress.    Appearance: Normal appearance. He is ill-appearing.  HENT:     Head: Normocephalic.     Nose: No rhinorrhea.     Mouth/Throat:     Mouth: Mucous membranes are moist.  Eyes:     General: No scleral icterus.    Pupils: Pupils are equal, round, and reactive to light.  Cardiovascular:     Rate and Rhythm: Normal rate and regular rhythm.  Pulmonary:     Effort: Pulmonary effort is normal.     Breath sounds: Normal breath sounds. No wheezing, rhonchi or rales.   Abdominal:     General: Bowel sounds are normal. There is no distension.     Palpations: Abdomen is soft.     Tenderness: There is no abdominal tenderness. There is no guarding.  Musculoskeletal:     Cervical back: Neck supple.     Right lower leg: No edema.     Left lower leg: No edema.  Skin:    General: Skin is warm and dry.  Neurological:     General: No focal deficit present.     Mental Status: He is alert and oriented to person, place, and time.  Psychiatric:        Mood and Affect: Mood normal.        Behavior: Behavior normal. Behavior is cooperative.     Data Reviewed:  Results  are pending, will review when available.  EKG: Vent. rate 81 BPM PR interval 198 ms QRS duration 113 ms QT/QTcB 367/426 ms P-R-T axes 52 29 8 Sinus rhythm Borderline intraventricular conduction delay  Assessment and Plan: Principal Problem:   Acute CVA (cerebrovascular accident) (HCC) Inpatient/telemetry. Frequent neurochecks. Consult PT and OT. Check fasting lipids. Check hemoglobin A1c. Check carotid Doppler. Check echocardiogram. Check MRI of brain. Risk factors modifications. Stroke team will [facility] at [facility].  Active Problems:   HTN (hypertension) Allow permissive hypertension.    Alcohol abuse Not drinking daily. Monitor for signs of withdrawal.    Tobacco use Tobacco cessation advised. Nicotine  replacement therapy ordered as needed.    Advance Care Planning:   Code Status: Full Code   Consults: Neurohospitalist team.  Family Communication:   Severity of Illness: The appropriate patient status for this patient is INPATIENT. Inpatient status is judged to be reasonable and necessary in order to provide the required intensity of service to ensure the patient's safety. The patient's presenting symptoms, physical exam findings, and initial radiographic and laboratory data in the context of their chronic comorbidities is felt to place them at high risk for  further clinical deterioration. Furthermore, it is not anticipated that the patient will be medically stable for discharge from the hospital within 2 midnights of admission.   * I certify that at the point of admission it is my clinical judgment that the patient will require inpatient hospital care spanning beyond 2 midnights from the point of admission due to high intensity of service, high risk for further deterioration and high frequency of surveillance required.*  Author: Danice Dural, MD 09/26/2023 3:34 PM  For on call review [email or web address].   This document was prepared using Dragon voice recognition software and may contain some unintended transcription errors.

## 2023-09-26 NOTE — Plan of Care (Signed)

## 2023-09-26 NOTE — ED Notes (Signed)
 Family members took hat, shirt, wallet and phone.

## 2023-09-26 NOTE — ED Notes (Signed)
 Report given to RN [id number] Carlin.

## 2023-09-27 ENCOUNTER — Inpatient Hospital Stay (HOSPITAL_COMMUNITY)

## 2023-09-27 ENCOUNTER — Encounter (HOSPITAL_COMMUNITY): Payer: Self-pay | Admitting: Cardiology

## 2023-09-27 DIAGNOSIS — I6389 Other cerebral infarction: Secondary | ICD-10-CM

## 2023-09-27 DIAGNOSIS — I63511 Cerebral infarction due to unspecified occlusion or stenosis of right middle cerebral artery: Secondary | ICD-10-CM | POA: Diagnosis not present

## 2023-09-27 DIAGNOSIS — E785 Hyperlipidemia, unspecified: Secondary | ICD-10-CM | POA: Diagnosis not present

## 2023-09-27 DIAGNOSIS — R55 Syncope and collapse: Secondary | ICD-10-CM

## 2023-09-27 DIAGNOSIS — I10 Essential (primary) hypertension: Secondary | ICD-10-CM

## 2023-09-27 DIAGNOSIS — I639 Cerebral infarction, unspecified: Secondary | ICD-10-CM | POA: Diagnosis not present

## 2023-09-27 DIAGNOSIS — R29704 NIHSS score 4: Secondary | ICD-10-CM | POA: Diagnosis not present

## 2023-09-27 DIAGNOSIS — F17210 Nicotine dependence, cigarettes, uncomplicated: Secondary | ICD-10-CM

## 2023-09-27 LAB — ECHOCARDIOGRAM COMPLETE BUBBLE STUDY
AR max vel: 3.58 cm2
AV Area VTI: 3.36 cm2
AV Area mean vel: 3.47 cm2
AV Mean grad: 2.8 mmHg
AV Peak grad: 4.7 mmHg
Ao pk vel: 1.09 m/s
Area-P 1/2: 2.39 cm2
P 1/2 time: 416 ms
S' Lateral: 3.1 cm
Single Plane A2C EF: 45.4 %

## 2023-09-27 LAB — CBC
HCT: 43.8 % (ref 39.0–52.0)
Hemoglobin: 14.5 g/dL (ref 13.0–17.0)
MCH: 31 pg (ref 26.0–34.0)
MCHC: 33.1 g/dL (ref 30.0–36.0)
MCV: 93.8 fL (ref 80.0–100.0)
Platelets: 175 10*3/uL (ref 150–400)
RBC: 4.67 MIL/uL (ref 4.22–5.81)
RDW: 12.8 % (ref 11.5–15.5)
WBC: 12 10*3/uL — ABNORMAL HIGH (ref 4.0–10.5)
nRBC: 0 % (ref 0.0–0.2)

## 2023-09-27 LAB — GLUCOSE, CAPILLARY
Glucose-Capillary: 100 mg/dL — ABNORMAL HIGH (ref 70–99)
Glucose-Capillary: 117 mg/dL — ABNORMAL HIGH (ref 70–99)
Glucose-Capillary: 163 mg/dL — ABNORMAL HIGH (ref 70–99)
Glucose-Capillary: 92 mg/dL (ref 70–99)
Glucose-Capillary: 94 mg/dL (ref 70–99)
Glucose-Capillary: 99 mg/dL (ref 70–99)

## 2023-09-27 LAB — COMPREHENSIVE METABOLIC PANEL WITH GFR
ALT: 11 U/L (ref 0–44)
AST: 17 U/L (ref 15–41)
Albumin: 3.6 g/dL (ref 3.5–5.0)
Alkaline Phosphatase: 71 U/L (ref 38–126)
Anion gap: 9 (ref 5–15)
BUN: 6 mg/dL — ABNORMAL LOW (ref 8–23)
CO2: 25 mmol/L (ref 22–32)
Calcium: 9.1 mg/dL (ref 8.9–10.3)
Chloride: 104 mmol/L (ref 98–111)
Creatinine, Ser: 1.34 mg/dL — ABNORMAL HIGH (ref 0.61–1.24)
GFR, Estimated: 58 mL/min — ABNORMAL LOW (ref >60)
Glucose, Bld: 97 mg/dL (ref 70–99)
Potassium: 3.8 mmol/L (ref 3.5–5.1)
Sodium: 138 mmol/L (ref 135–145)
Total Bilirubin: 0.9 mg/dL (ref 0.0–1.2)
Total Protein: 8 g/dL (ref 6.5–8.1)

## 2023-09-27 LAB — LIPID PANEL
Cholesterol: 164 mg/dL (ref 0–200)
HDL: 27 mg/dL — ABNORMAL LOW (ref >40)
LDL Cholesterol: 104 mg/dL — ABNORMAL HIGH (ref 0–99)
Total CHOL/HDL Ratio: 6.1 ratio
Triglycerides: 163 mg/dL — ABNORMAL HIGH (ref <150)
VLDL: 33 mg/dL (ref 0–40)

## 2023-09-27 LAB — HIV ANTIBODY (ROUTINE TESTING W REFLEX): HIV Screen 4th Generation wRfx: NONREACTIVE

## 2023-09-27 MED ORDER — AMOXICILLIN-POT CLAVULANATE 875-125 MG PO TABS
1.0000 | ORAL_TABLET | Freq: Two times a day (BID) | ORAL | Status: DC
Start: 2023-09-27 — End: 2023-10-02
  Administered 2023-09-27 – 2023-10-01 (×8): 1 via ORAL
  Filled 2023-09-27 (×8): qty 1

## 2023-09-27 MED ORDER — IOHEXOL 350 MG/ML SOLN
75.0000 mL | Freq: Once | INTRAVENOUS | Status: AC | PRN
  Administered 2023-09-27: 75 mL via INTRAVENOUS

## 2023-09-27 MED ORDER — FLUTICASONE FUROATE-VILANTEROL 100-25 MCG/ACT IN AEPB
1.0000 | INHALATION_SPRAY | Freq: Every day | RESPIRATORY_TRACT | Status: DC
  Administered 2023-09-28 – 2023-10-01 (×4): 1 via RESPIRATORY_TRACT
  Filled 2023-09-27: qty 28

## 2023-09-27 MED ORDER — IPRATROPIUM-ALBUTEROL 0.5-2.5 (3) MG/3ML IN SOLN: 3.0000 mL | RESPIRATORY_TRACT | Status: DC | PRN

## 2023-09-27 MED ORDER — AMLODIPINE BESYLATE 2.5 MG PO TABS
2.5000 mg | ORAL_TABLET | Freq: Every day | ORAL | Status: DC
  Administered 2023-09-27 – 2023-10-01 (×5): 2.5 mg via ORAL
  Filled 2023-09-27 (×5): qty 1

## 2023-09-27 MED ORDER — FLUTICASONE PROPIONATE 50 MCG/ACT NA SUSP
2.0000 | Freq: Every day | NASAL | Status: DC
  Administered 2023-09-27 – 2023-10-01 (×5): 2 via NASAL
  Filled 2023-09-27: qty 16

## 2023-09-27 NOTE — Progress Notes (Addendum)
 STROKE TEAM PROGRESS NOTE   INTERIM HISTORY/SUBJECTIVE Patient denies any complaint but family noticed slurred speech and facial droop brought in for evaluation and MRI scan shows patchy subacute right MCA branch infarct.  Needs loop recorder interrogated- request sent. Non compliant with ASA. Will recommend DAPT and then ASA daily.  OBJECTIVE  CBC    Component Value Date/Time   WBC 12.0 (H) 09/27/2023 0708   RBC 4.67 09/27/2023 0708   HGB 14.5 09/27/2023 0708   HCT 43.8 09/27/2023 0708   PLT 175 09/27/2023 0708   MCV 93.8 09/27/2023 0708   MCH 31.0 09/27/2023 0708   MCHC 33.1 09/27/2023 0708   RDW 12.8 09/27/2023 0708   LYMPHSABS 5.7 (H) 09/26/2023 1250   MONOABS 0.7 09/26/2023 1250   EOSABS 0.1 09/26/2023 1250   BASOSABS 0.0 09/26/2023 1250    BMET    Component Value Date/Time   NA 138 09/26/2023 1250   K 4.0 09/26/2023 1250   CL 109 09/26/2023 1250   CO2 21 (L) 09/26/2023 1250   GLUCOSE 89 09/26/2023 1250   BUN 11 09/26/2023 1250   CREATININE 1.22 09/26/2023 1250   CALCIUM  9.0 09/26/2023 1250   GFRNONAA >60 09/26/2023 1250    IMAGING past 24 hours MR BRAIN WO CONTRAST Result Date: 09/27/2023 CLINICAL DATA:  Neuro deficit, acute, stroke suspected EXAM: MRI HEAD WITHOUT CONTRAST TECHNIQUE: Multiplanar, multiecho pulse sequences of the brain and surrounding structures were obtained without intravenous contrast. COMPARISON:  CT head from earlier today.  MRI head April 4, 25. FINDINGS: Brain: Acute infarcts in the right MCA territory including in the right basal ganglia, right frontal, parietal and temporal lobes. Infarcts are most confluent/extensive in the posterior right MCA territory including the parietal lobe. Edema without significant mass effect. No midline shift. No evidence of acute hemorrhage, mass lesion or hydrocephalus. Remote pontine and left cerebellar infarcts. Remote left parietal and bilateral frontal cortical infarcts. Remote bilateral corona radiata  infarcts and chronic microvascular ischemic change. Vascular: Major arterial flow voids are maintained at the skull base. Skull and upper cervical spine: Normal marrow signal. Sinuses/Orbits: Bilateral maxillary sinus mucosal thickening and air-fluid levels. No acute orbital findings. IMPRESSION: 1. Acute, predominantly posterior right MCA territory infarcts. Edema without significant mass effect. 2. Many remote infarcts and chronic microvascular ischemic change. Electronically Signed   By: Stevenson Elbe M.D.   On: 09/27/2023 00:12   CT ANGIO HEAD NECK W WO CM Result Date: 09/26/2023 CLINICAL DATA:  Neuro deficit, acute, stroke suspected EXAM: CT ANGIOGRAPHY HEAD AND NECK WITH AND WITHOUT CONTRAST TECHNIQUE: Multidetector CT imaging of the head and neck was performed using the standard protocol during bolus administration of intravenous contrast. Multiplanar CT image reconstructions and MIPs were obtained to evaluate the vascular anatomy. Carotid stenosis measurements (when applicable) are obtained utilizing NASCET criteria, using the distal internal carotid diameter as the denominator. RADIATION DOSE REDUCTION: This exam was performed according to the departmental dose-optimization program which includes automated exposure control, adjustment of the mA and/or kV according to patient size and/or use of iterative reconstruction technique. CONTRAST:  75mL OMNIPAQUE  IOHEXOL  350 MG/ML SOLN COMPARISON:  Same day CT head. FINDINGS: CTA NECK FINDINGS Aortic arch: Aortic atherosclerosis. Great vessel origins are patent. Right carotid system: No evidence of dissection, stenosis (50% or greater), or occlusion. Left carotid system: No evidence of dissection, stenosis (50% or greater), or occlusion. Vertebral arteries: Right dominant. No evidence of dissection, stenosis (50% or greater), or occlusion. Skeleton: No evidence of acute abnormality on  limited assessment. Other neck: No evidence of acute abnormality on limited  assessment. Upper chest: Visualized lung apices are clear. Review of the MIP images confirms the above findings CTA HEAD FINDINGS Anterior circulation: The intracranial ICAs are patent. Moderate right paraclinoid ICA stenosis. Occlusion of the proximal right M1 MCA with poor/irregular more distal M1 MCA reconstitution. Asymmetric opacification of the more distal right MCA vessels versus the left. The left MCA and bilateral ACAs are patent. Moderate distal left M1 MCA stenosis. Posterior circulation: Both vertebral arteries, basilar artery and bilateral posterior short are patent without proximal hemodynamically significant stenosis. Venous sinuses: As permitted by contrast timing, patent. Review of the MIP images confirms the above findings IMPRESSION: 1. Occlusion of the proximal right M1 MCA with diminished more distal M1 MCA reconstitution. Asymmetrically poor opacification of the more distal right MCA vessels. 2. Moderate right paraclinoid ICA and distal left M1 MCA stenosis. Findings discussed with Dr. Del Favia via telephone at 11:43 p.m. Electronically Signed   By: Stevenson Elbe M.D.   On: 09/26/2023 23:44   CT Head Wo Contrast Result Date: 09/26/2023 CLINICAL DATA:  Delayed slowed speech. EXAM: CT HEAD WITHOUT CONTRAST TECHNIQUE: Contiguous axial images were obtained from the base of the skull through the vertex without intravenous contrast. RADIATION DOSE REDUCTION: This exam was performed according to the departmental dose-optimization program which includes automated exposure control, adjustment of the mA and/or kV according to patient size and/or use of iterative reconstruction technique. COMPARISON:  Head CT and brain MRI 08/28/2023 FINDINGS: Brain: Wedge-shaped area of peripheral edema with loss of gray-white differentiation in the posterior right parieto-occipital region is consistent with acute to subacute infarct. No evidence for hemorrhagic transformation. Old right frontal infarct again noted.  Lacunar infarcts are seen in the basal ganglia, as before. Patchy low attenuation in the deep hemispheric and periventricular white matter is nonspecific, but likely reflects chronic microvascular ischemic demyelination. Bilateral cerebellar infarcts are stable as is a chronic lacunar infarct in the right pons. No hydrocephalus or midline shift. No abnormal extra-axial fluid collection. Vascular: No hyperdense vessel or unexpected calcification. Skull: No evidence for fracture. No worrisome lytic or sclerotic lesion. Sinuses/Orbits: Trace fluid level noted left sphenoid sinus. The remaining visualized paranasal sinuses and mastoid air cells are clear. Visualized portions of the globes and intraorbital fat are unremarkable. Other: None. IMPRESSION: 1. Wedge-shaped area of peripheral edema with loss of gray-white differentiation in the posterior right parietooccipital region is consistent with acute to subacute infarct. No evidence for hemorrhagic transformation. No midline shift. 2. Stable appearance of old right frontal infarct, bilateral cerebellar infarcts, basal ganglia lacunar infarcts and chronic microvascular ischemic demyelination. Critical Value/emergent results were called by telephone at the time of interpretation on 09/26/2023 at 1:22 pm to provider Lind Repine , who verbally acknowledged these results. Electronically Signed   By: Donnal Fusi M.D.   On: 09/26/2023 13:22    Vitals:   09/26/23 1955 09/27/23 0027 09/27/23 0354 09/27/23 0729  BP: (!) 176/84 (!) 166/74 (!) 167/96 (!) 156/96  Pulse: 60 68 79 85  Resp:  16 16 19   Temp: 98.9 F (37.2 C) 98 F (36.7 C) 98.4 F (36.9 C) 97.9 F (36.6 C)  TempSrc: Oral Oral Oral Oral  SpO2: 92% 97% 98% 95%  Weight:      Height:         PHYSICAL EXAM General:  Alert, well-nourished, well-developed patient in no acute distress Psych:  Mood and affect appropriate for situation CV: Regular rate  and rhythm on monitor Respiratory:  Regular,  unlabored respirations on room air GI: Abdomen soft and nontender   NEURO:  Mental Status: AA&Ox3, patient is able to give clear and coherent history Speech/Language: speech is without dysarthria or aphasia.  Naming, repetition, fluency, and comprehension intact.  Cranial Nerves:  II: PERRL. Visual fields full.  III, IV, VI: EOMI. Eyelids elevate symmetrically.  V: Sensation is intact to light touch and symmetrical to face.  VII: left facial droop VIII: hearing intact to voice. IX, X: Palate elevates symmetrically. Phonation is normal.  WU:[id number] shrug 5/5. XII: tongue is midline without fasciculations. Motor: RUE and RLE full strength Mild left lower extremity weankess, cramping in left leg Tone: is slightly increased in right hand Sensation- Intact to light touch bilaterally. Extinction absent to light touch to DSS.   Coordination:orbits. Slow RAM on the left.  Gait- deferred  ASSESSMENT/PLAN  Mr. Ryan Montgomery is a 68 y.o. 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  NIH on Admission 4  Acute Ischemic Infarct:  right posterior MCA territory infarcts  Etiology: Recurrent cryptogenic  Code Stroke CT head - Wedge-shaped area of peripheral edema with loss of gray-white differentiation in the posterior right parietooccipital region is consistent with acute to subacute infarct. No evidence for hemorrhagic transformation. No midline shift. Stable appearance of old right frontal infarct, bilateral cerebellar infarcts, basal ganglia lacunar infarcts and chronic microvascular ischemic demyelination. CTA head & neck Occlusion of the proximal right M1 MCA with diminished more distal M1 MCA reconstitution. Asymmetrically poor opacification of the more distal right MCA vessels. Moderate right paraclinoid ICA and distal left M1 MCA stenosis. MRI   Acute, predominantly posterior right MCA territory infarcts. Edema without significant mass effect. CT CAP-  No evidence of malignancy or metastatic disease in the chest, abdomen, or pelvis. 2D Echo Pending LDL 63 HgbA1c 4.4 VTE prophylaxis - lovenox  aspirin  81 mg daily (non compliant) prior to admission, now on aspirin  81 mg daily and clopidogrel  75 mg daily for 3 weeks and then ASA alone. Therapy recommendations:  Pending Disposition:  Pending   Hx of Stroke/TIA 2023- right pontine and left watershed scattered infarcts, embolic pattern secondary to unclear source  TEE tiny PFO.  No clot  Hypercoagulable work-up Homocystein 18.0, Lupus anticoagulant negative, ANA wnl, HIV negative, TSH wnl, B12 wnl, RPR negative Loop recorder placed- no afib for 3 months loop was transmitting, needs to be re interrogated  Hypertension Home meds: Metoprolol  Stable Blood Pressure Goal: BP less than 220/110   Hyperlipidemia Home meds: Atorvastatin  20 mg, resumed in hospital LDL 63, goal < 70 Continue statin at discharge  Tobacco Abuse Patient smokes        Ready to quit? Yes Nicotine  replacement therapy provided  Other Stroke Risk Factors ETOH use, alcohol level 167, advised to drink no more than 2 drink(s) a day Obesity, Body mass index is 30.68 kg/m., BMI >/= 30 associated with increased stroke risk, recommend weight loss, diet and exercise as appropriate   Hospital day # 1  Patient seen and examined by NP/APP with MD. MD to update note as needed.   Imogene Mana, DNP, FNP-BC [facility] Pager: [phone] I have personally obtained history,examined this patient, reviewed notes, independently viewed imaging studies, participated in medical decision making and plan of care.ROS completed by me personally and pertinent positives fully documented  I  have made any additions or clarifications directly to the above note. Agree with note above.  Patient with prior history of  cryptogenic strokes in 2023 with negative extensive evaluation including loop recorder.  He presents with recurrent right MCA branch patchy infarct.  Recommend dual antiplatelet therapy aspirin  Plavix  3 weeks followed by aspirin  alone as he was noncompliant with it.  Check CT scan chest abdomen pelvis for occult malignancy.  Interrogate loop recorder for paroxysmal A-fib.  Mobilize out of bed.  Therapy consults.  Long discussion with patient about evaluation and treatment and answered questions.  Discussed with Dr. Lydia Sams. I have spent a total of  50 minutes with the patient reviewing hospital notes,  test results, labs and examining the patient as well as establishing an assessment and plan that was discussed personally with the patient.  > 50% of time was spent in direct patient care.      Ardella Beaver, MD Medical Director [facility] Pager: [phone] 09/27/2023 2:03 PM   To contact Stroke Continuity provider, please refer to [email or web address]. After hours, contact General Neurology

## 2023-09-27 NOTE — Progress Notes (Signed)
  Echocardiogram 2D Echocardiogram has been performed.  Ryan Montgomery 09/27/2023, 4:56 PM

## 2023-09-27 NOTE — Progress Notes (Signed)
 [facility] Progress Note Patient: Ryan Montgomery RUE:[id number] DOB: June 14, 1955 DOA: 09/26/2023  DOS: the patient was seen and examined on 09/27/2023  Brief Hospital Course: Patient with PMH of cryptogenic stroke, hep C, HTN, alcohol abuse, active smoker, seizures, gout, recurrent syncope presented to the hospital with complaints of slurred speech. Patient was out of the duration for tPA Neurology was consulted.  Further workup initiated. Incidentally was found to have episodes of SVT as well as prolonged sinus pauses associated with his recurrent syncopal events in the past on ILR interrogation.  Cardiology consulted. Assessment and Plan: Recurrent acute ischemic infarct. Right posterior MCA territory.  CT code stroke wedge-shaped area of edema in the right.  Occipital region. MRI brain confirms acute right MCA territory infarct with cerebral edema without mass effect. Neurology consulted. Hypercoagulable workup initiated which so far is negative. CT chest Abdo pelvis to look for occult malignancy is also negative. Echocardiogram currently pending. LDL 63.  Hemoglobin A1c 4.4. Patient was on 81 mg aspirin  although noncompliant. Current recommendation is 81 mg aspirin  plus Plavix  for 3 weeks followed by 8 aspirin  alone. Actively smokes, recommended to quit smoking.  Suspected COPD with exacerbation. Acute sinusitis and bronchitis Patient reports sinus headache.  Bilateral expiratory wheezing on exam.  CT shows evidence of bronchitis. Will initiate treatment with oral Augmentin  as well as nebulization therapy and inhalers and monitor response.  HTN. On metoprolol  at home.  Blood pressure stable.  Continue.  HLD. Goal less than 70, currently LDL is 63. Continue statin atorvastatin  20 mg.  Active smoker. Smokes 3 to 4 cigarettes a day. Patient currently willing to quit.  Nicotine  therapy on discharge.  Alcohol abuse. Alcohol level is 167.  Recommended to stop abusing alcohol in  the setting of SVTs.  Obesity Class 1 Body mass index is 30.68 kg/m.  Placing the pt at higher risk of poor outcomes. Recommended weight loss to reduce the risk of the stroke.  SVT Sinus pause. Patient has a loop recorder placed for cryptogenic stroke. In April 2024 patient presented with a syncopal event.  This was consistent with SVT seen on the ILR. Patient also had an ED visit in November 2024 for reported 'flat lining' episode requiring "CPR" at home.  This is concurrent with his episode of sinus pause seen on ILR. Cardiology consulted.  EP will be evaluating the patient.  Appreciate assistance.    Subjective: No nausea, no fever no chills.  Reports cough as well as sinus headache.  No dizziness and lightheadedness.  Physical Exam: General: in Mild distress, No Rash Cardiovascular: S1 and S2 Present, No Murmur Respiratory: Good respiratory effort, Bilateral Air entry present. No Crackles, bilateral expiratory wheezes Abdomen: Bowel Sound present, No tenderness Extremities: No edema Neuro: Alert and oriented x3, no new focal deficit  Data Reviewed: I have Reviewed nursing notes, Vitals, and Lab results. Since last encounter, pertinent lab results CBC and BMP   . I have ordered test including CBC and BMP  . I have discussed pt's care plan and test results with cardiology and neurology  .   Disposition: Status is: Inpatient Remains inpatient appropriate because: Further cardiac workup.  enoxaparin  (LOVENOX ) injection 40 mg Start: 09/26/23 2200   Family Communication: Discussed with wife on the phone. Level of care: Telemetry Medical continue indefinitely Vitals:   09/27/23 0354 09/27/23 0729 09/27/23 1504 09/27/23 1652  BP: (!) 167/96 (!) 156/96 (!) 159/93 (!) 172/93  Pulse: 79 85 80 74  Resp: 16 19  18  Temp: 98.4 F (36.9 C) 97.9 F (36.6 C) 98.6 F (37 C) 98.9 F (37.2 C)  TempSrc: Oral Oral Oral Oral  SpO2: 98% 95% 98% 97%  Weight:      Height:          Author: Charlean Congress, MD 09/27/2023 6:46 PM  Please look on [email or web address] to find out who is on call.

## 2023-09-27 NOTE — Evaluation (Signed)
 Speech Language Pathology Evaluation Patient Details Name: Ryan Montgomery MRN: [id number] DOB: 11-13-1955 Today's Date: 09/27/2023 Time: 3244-0102 SLP Time Calculation (min) (ACUTE ONLY): 20 min  Problem List:  Patient Active Problem List   Diagnosis Date Noted   HTN (hypertension) 04/27/2023   Aortic dilatation (HCC) 06/04/2021   Dysphagia 06/04/2021   Acute CVA (cerebrovascular accident) (HCC) 06/03/2021   AKI (acute kidney injury) (HCC) 06/03/2021   Tobacco use 06/03/2021   Chronic pain in left foot 05/14/2021   Rib fractures 09/09/2020   Chronic left hip pain 12/09/2019   Seizure disorder (HCC)    Syncope 11/27/2019   Alcohol abuse 08/29/2019   Gout 08/29/2019   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   Unspecified fracture of left acetabulum, initial encounter for closed fracture (HCC) 04/03/2019   History of CVA (cerebrovascular accident) 2018   Hepatitis C 07/18/2013   Past Medical History:  Past Medical History:  Diagnosis Date   Chickenpox    Chickenpox    Heat stroke    Hepatitis C    Stroke [facility])    Past Surgical History:  Past Surgical History:  Procedure Laterality Date   BUBBLE STUDY  06/10/2021   Procedure: BUBBLE STUDY;  Surgeon: Harrold Lincoln, MD;  Location: [facility] ENDOSCOPY;  Service: Cardiovascular;;   colonoscopy  08-28-08   per Dr. Adan Holms, clear, repeat in 10 yrs    fx rt ankle     pins placed   JOINT REPLACEMENT Left 09/12/2019   LOOP RECORDER INSERTION N/A 06/10/2021   Procedure: LOOP RECORDER INSERTION;  Surgeon: Lei Pump, MD;  Location: [facility] INVASIVE CV LAB;  Service: Cardiovascular;  Laterality: N/A;   TEE WITHOUT CARDIOVERSION N/A 06/10/2021   Procedure: TRANSESOPHAGEAL ECHOCARDIOGRAM (TEE);  Surgeon: Harrold Lincoln, MD;  Location: [facility] ENDOSCOPY;  Service: Cardiovascular;  Laterality: N/A;   TOTAL HIP ARTHROPLASTY Left 09/12/2019   per Dr. Awanda Bogaert at [facility]    HPI:  Patient is a 68 y.o. male with PMH:  herpes zoster, heatstroke, hep C, CVA, HTN, AKI, chronic left hip and left foot pain, ETOH abuse, tobacco abuse, gout, syncopal episode, seizure. He presented to the hospital on 09/26/23 with delay/slow speech that started in AM and was witnessed by his daughter. CT head without contrast showed a wedge shaped area of peripheral edema with loss of gray-white differentiation of posterior right. MRI brain showed posterior right MCA territory infarcts.   Assessment / Plan / Recommendation Clinical Impression  Patient is presenting with impairments in cognitive-linguistic functioning but difficult to determine how far off he is from his baseline deficits from prior CVA's. He was oriented to month, year, day of week but stated date as "23rd". He is aware that he is at [facility] and that he had a stroke but had difficulty providing specific information. Initially, he was very monotone and gave stilted responses, however he started to become more interactive when talking about politics. Responses to open-ended questions were delayed and he required cues to elaborate. Overall eye contact was was poor, even when he was more interactive. He denied any current deficits in his speech or cognition at this time. Instances of phonemic paraphasias heard a few times during evaluation. SLP recommending ongoing skilled intervention at acute care level followed by SLP at next venue of care.    SLP Assessment  SLP Recommendation/Assessment: Patient needs continued Speech Lanaguage Pathology Services SLP Visit Diagnosis: Cognitive communication deficit (R41.841)    Recommendations for follow up therapy are one  component of a multi-disciplinary discharge planning process, led by the attending physician.  Recommendations may be updated based on patient status, additional functional criteria and insurance authorization.    Follow Up Recommendations  Acute inpatient rehab (3hours/day)    Assistance Recommended at Discharge   Intermittent Supervision/Assistance  Functional Status Assessment Patient has had a recent decline in their functional status and demonstrates the ability to make significant improvements in function in a reasonable and predictable amount of time.  Frequency and Duration min 2x/week  1 week      SLP Evaluation Cognition  Overall Cognitive Status: No family/caregiver present to determine baseline cognitive functioning Arousal/Alertness: Awake/alert Orientation Level: Oriented to person;Oriented to place;Oriented to situation;Disoriented to time Year: 2025 Month: May Day of Week: Correct Attention: Sustained Sustained Attention: Impaired Sustained Attention Impairment: Verbal complex Memory: Impaired Memory Impairment: Retrieval deficit Awareness: Impaired Awareness Impairment: Emergent impairment       Comprehension  Auditory Comprehension Overall Auditory Comprehension: Other (comment) (baseline impairment at higher level) Conversation: Simple EffectiveTechniques: Extra processing time Visual Recognition/Discrimination Discrimination: Not tested Reading Comprehension Reading Status: Not tested    Expression Expression Primary Mode of Expression: Verbal Verbal Expression Overall Verbal Expression: Impaired Initiation: Impaired Level of Generative/Spontaneous Verbalization: Conversation;Sentence;Phrase Pragmatics: Impairment Impairments: Abnormal affect;Monotone;Eye contact Interfering Components: Premorbid deficit Effective Techniques: Open ended questions Non-Verbal Means of Communication: Not applicable   Oral / Motor  Oral Motor/Sensory Function Overall Oral Motor/Sensory Function: Mild impairment Facial ROM: Reduced left Facial Symmetry: Abnormal symmetry left Facial Strength: Within Functional Limits Facial Sensation: Within Functional Limits Lingual ROM: Within Functional Limits Lingual Symmetry: Abnormal symmetry left Lingual Strength: Within Functional  Limits Lingual Sensation: Within Functional Limits Velum: Within Functional Limits Mandible: Within Functional Limits Motor Speech Overall Motor Speech: Impaired at baseline Phonation: Normal Resonance: Within functional limits Articulation: Impaired Level of Impairment: Conversation Intelligibility: Intelligible Motor Planning: Not tested Interfering Components: Premorbid status            Jacqualine Mater, MA, CCC-SLP Speech Therapy

## 2023-09-27 NOTE — Evaluation (Signed)
 Physical Therapy Evaluation Patient Details Name: Ryan Montgomery MRN: [id number] DOB: May 28, 1955 Today's Date: 09/27/2023  History of Present Illness  Pt is 68 yo presenting to [facility] ED initially on 5/3; transferred to [facility] due to family concerns for speech being slow and slurred. CT at [facility] ER with findings of wedge shaped subacute infarct in R hemisphere per neurologist note. PMH: CVA with loop recorder in place, Hep C and tobacco abuse.  Clinical Impression  Pt is presenting below baseline level of functioning. Pt reports he was using a SPC but then no AD for gait and works out at [employer]. Currently with RW pt required significant amount of assist with gait and was running RW into objects requiring frequent cueing. Pt has impaired balance. Pt is presenting with possible cognitive deficits or L sided neglect; further testing would be appreciated by OT/Speech. Currently recommending 24/7 supervision. Due to pt current functional status, home set up and available assistance at home recommending skilled physical therapy services 3x/week in order to address strength, balance and functional mobility to decrease risk for falls, injury and re-hospitalization.           If plan is discharge home, recommend the following: Supervision due to cognitive status;Assistance with cooking/housework;A little help with walking and/or transfers;Help with stairs or ramp for entrance     Equipment Recommendations Cane  Recommendations for Other Services  Speech consult;Other (comment) (cognitive speech consult)    Functional Status Assessment Patient has had a recent decline in their functional status and demonstrates the ability to make significant improvements in function in a reasonable and predictable amount of time.     Precautions / Restrictions Precautions Precautions: Fall Recall of Precautions/Restrictions: Intact Restrictions Weight Bearing Restrictions Per Provider Order: No      Mobility  Bed  Mobility Overal bed mobility: Needs Assistance Bed Mobility: Supine to Sit     Supine to sit: Supervision     General bed mobility comments: sitting in recliner at end of session    Transfers Overall transfer level: Needs assistance Equipment used: Rolling walker (2 wheels), None Transfers: Sit to/from Stand, Bed to chair/wheelchair/BSC Sit to Stand: Contact guard assist   Step pivot transfers: Contact guard assist       General transfer comment: CGA with verbal cues for safe hand placement with RW. No verbal cues for sit to stand without AD.    Ambulation/Gait Ambulation/Gait assistance: Contact guard assist, Min assist Gait Distance (Feet): 15 Feet Assistive device: Rolling walker (2 wheels), None Gait Pattern/deviations: Step-through pattern, Staggering right, Staggering left Gait velocity: decreased Gait velocity interpretation: <1.31 ft/sec, indicative of household ambulator   General Gait Details: Pt ambulating poorly with RW running into objects on the L poor use of AD frequent reminders and MIn A for balance and navigation of AD. Improved with use of bed rails and furniture walking. Pt states he uses a SPC at home.   Modified Rankin (Stroke Patients Only) Modified Rankin (Stroke Patients Only) Pre-Morbid Rankin Score: Slight disability Modified Rankin: Moderate disability     Balance Overall balance assessment: Needs assistance Sitting-balance support: Single extremity supported Sitting balance-Leahy Scale: Good     Standing balance support: Single extremity supported, During functional activity, No upper extremity supported Standing balance-Leahy Scale: Poor Standing balance comment: requires UE support but not necessarily on AD.           Pertinent Vitals/Pain Pain Assessment Pain Assessment: Faces Pain Score: 6  Pain Location: headache Pain Descriptors / Indicators: Aching,  Headache Pain Intervention(s): Monitored during session, Limited  activity within patient's tolerance, Patient requesting pain meds-RN notified    Home Living Family/patient expects to be discharged to:: Private residence Living Arrangements: Alone Available Help at Discharge: Family;Available PRN/intermittently Type of Home: Apartment Home Access: Stairs to enter Entrance Stairs-Rails: Right;Left Entrance Stairs-Number of Steps: 3   Home Layout: One level Home Equipment: Agricultural consultant (2 wheels);Cane - single point;Shower seat      Prior Function Prior Level of Function : Independent/Modified Independent;Driving             Mobility Comments: Pt uses uses SPC for mobility. ADLs Comments: Pt has a care taker 4 hours Tues/Wed/Fri helps with cleaning, groceries, They can help with meals/transportation. Pt does own medication and finances.     Extremity/Trunk Assessment   Upper Extremity Assessment Upper Extremity Assessment: Defer to OT evaluation    Lower Extremity Assessment Lower Extremity Assessment: Overall WFL for tasks assessed;Generalized weakness    Cervical / Trunk Assessment Cervical / Trunk Assessment: Normal  Communication   Communication Communication: Impaired Factors Affecting Communication: Hearing impaired    Cognition Arousal: Alert Behavior During Therapy: WFL for tasks assessed/performed   PT - Cognitive impairments: No apparent impairments       Following commands: Intact       Cueing Cueing Techniques: Verbal cues     General Comments General comments (skin integrity, edema, etc.): No noted skin issues. No signs/symptoms of cardiac/respiratory distress. Pt would benefit from higher level cognitive assessment        Assessment/Plan    PT Assessment Patient needs continued PT services  PT Problem List Decreased balance;Decreased mobility;Decreased safety awareness       PT Treatment Interventions DME instruction;Balance training;Gait training;Neuromuscular re-education;Functional mobility  training;Therapeutic activities;Therapeutic exercise;Stair training;Patient/family education    PT Goals (Current goals can be found in the Care Plan section)  Acute Rehab PT Goals Patient Stated Goal: to return home per patient PT Goal Formulation: With patient Time For Goal Achievement: 10/11/23 Potential to Achieve Goals: Fair    Frequency Min 3X/week        AM-PAC PT "6 Clicks" Mobility  Outcome Measure Help needed turning from your back to your side while in a flat bed without using bedrails?: A Little Help needed moving from lying on your back to sitting on the side of a flat bed without using bedrails?: A Little Help needed moving to and from a bed to a chair (including a wheelchair)?: A Little Help needed standing up from a chair using your arms (e.g., wheelchair or bedside chair)?: A Little Help needed to walk in hospital room?: A Little Help needed climbing 3-5 steps with a railing? : A Little 6 Click Score: 18    End of Session Equipment Utilized During Treatment: Gait belt Activity Tolerance: Patient tolerated treatment well Patient left: with chair alarm set;in chair;with call bell/phone within reach Nurse Communication: Mobility status PT Visit Diagnosis: Unsteadiness on feet (R26.81);Other abnormalities of gait and mobility (R26.89)    Time: 0865-7846 PT Time Calculation (min) (ACUTE ONLY): 27 min   Charges:   PT Evaluation $PT Eval Low Complexity: 1 Low PT Treatments $Therapeutic Activity: 8-22 mins PT General Charges $$ ACUTE PT VISIT: 1 Visit        Sloan Duncans, DPT, CLT  Acute Rehabilitation Services Office: [phone] (Secure chat preferred)   Jenice Mitts 09/27/2023, 4:04 PM

## 2023-09-27 NOTE — Plan of Care (Signed)
  Problem: Coping: Goal: Will verbalize positive feelings about self Outcome: Progressing   Problem: Self-Care: Goal: Ability to participate in self-care as condition permits will improve Outcome: Progressing   

## 2023-09-27 NOTE — Consult Note (Signed)
 CARDIOLOGY CONSULT NOTE  Patient ID: Ryan Montgomery MRN: [id number] DOB/AGE: [birth year] 68 y.o.  Admit date: 09/26/2023 Primary Physician Donley Furth, MD Primary Cardiologist None summary Chief Complaint: Slurred speech Requesting:  Abnormal loop recording.   HPI:   He was admitted with slow and slurred speech.  He had facial droop.  MRI demonstrated patchy subacute right MCA infarct.  He been noncompliant with his aspirin  and has been treated since admission with DAPT.   He was last seen by us  in 2023.  He has a history of cryptogenic stroke.  He had a loop monitor placed for this.  He had right pontine and left watershed infarcts and embolic pattern.  Echocardiogram carotid Dopplers were unremarkable.  TEE in January 2023 demonstrated well-preserved ejection fraction.  Did have aortic root dilatation 44 mm.  We are called because of abnormalities on his loop interrogation.  He has a history of tachycardia with evidence of supraventricular tachycardia sporadically.  There is also bradycardia noted with longest being a 7-second pause back in November.  He has been in the emergency room a couple times this year for syncope and I looked through these records.  It was not thought to be a cardiac etiology at that time.  Etiology was unspecified.  First episode that I am looking at was September 21, 2022.  He was brought in in July with acute alcohol intoxication and marijuana use and had an episode where he went to the ground.  Neither of these occasions were there any arrhythmias noted that I can find.  In November he had another passing out spell and left family was there and said his heart stopped.  He was given CPR by the family but when he got to the emergency room there were no acute findings.  Again alcohol was suggested.  The patient cannot really give me any history of these events.  He seems slightly confused but does not say that he is passing out.  He said that he stopped drinking.  He  does remember the events that brought him to the hospital today other than say that he thinks he was fine.  He does not report any chest pressure, neck or arm discomfort.  He had no weight gain or edema.   Past Medical History:  Diagnosis Date   Chickenpox    Heat stroke    Hepatitis C    Stroke [facility])     Past Surgical History:  Procedure Laterality Date   BUBBLE STUDY  06/10/2021   Procedure: BUBBLE STUDY;  Surgeon: Harrold Lincoln, MD;  Location: [facility] ENDOSCOPY;  Service: Cardiovascular;;   colonoscopy  08-28-08   per Dr. Adan Holms, clear, repeat in 10 yrs    fx rt ankle     pins placed   JOINT REPLACEMENT Left 09/12/2019   LOOP RECORDER INSERTION N/A 06/10/2021   Procedure: LOOP RECORDER INSERTION;  Surgeon: Lei Pump, MD;  Location: [facility] INVASIVE CV LAB;  Service: Cardiovascular;  Laterality: N/A;   TEE WITHOUT CARDIOVERSION N/A 06/10/2021   Procedure: TRANSESOPHAGEAL ECHOCARDIOGRAM (TEE);  Surgeon: Harrold Lincoln, MD;  Location: [facility] END[facility]COPY;  Service: Cardiovascular;  Laterality: N/A;   TOTAL HIP ARTHROPLASTY Left 09/12/2019   per Dr. Awanda Bogaert at [facility]     Allergies  Allergen Reactions   Moxifloxacin Hives   Medications Prior to Admission  Medication Sig Dispense Refill Last Dose/Taking   ADVIL  200 MG tablet Take 200-400 mg by mouth every 6 (six) hours  as needed (for sinus headaches or headaches).   Past Week   celecoxib  (CELEBREX ) 200 MG capsule Take 1 capsule (200 mg total) by mouth 2 (two) times daily as needed for moderate pain (pain score 4-6). 60 capsule 5 Past Week   Dextran 70-Hypromellose (ARTIFICIAL TEARS PF OP) Place 1 drop into both eyes 3 (three) times daily as needed (for dryness).   Unknown   loratadine (CLARITIN) 10 MG tablet Take 10 mg by mouth daily as needed for allergies or rhinitis.   Unknown   metoprolol  succinate (TOPROL -XL) 25 MG 24 hr tablet Take 1 tablet (25 mg total) by mouth daily. 30 tablet 2 09/23/2023   TYLENOL  500 MG  tablet Take 500-1,000 mg by mouth every 6 (six) hours as needed (for pain).   Past Week   amoxicillin -clavulanate (AUGMENTIN ) 875-125 MG tablet Take 1 tablet by mouth 2 (two) times daily. (Patient not taking: Reported on 09/26/2023) 20 tablet 0 Not Taking   aspirin  EC 81 MG EC tablet Take 1 tablet (81 mg total) by mouth daily. Swallow whole. (Patient not taking: Reported on 09/26/2023)   Not Taking   atorvastatin  (LIPITOR) 20 MG tablet Take 1 tablet (20 mg total) by mouth daily. (Patient not taking: Reported on 09/26/2023)   Not Taking   tamsulosin  (FLOMAX ) 0.4 MG CAPS capsule Take 1 capsule (0.4 mg total) by mouth daily after supper. (Patient not taking: Reported on 09/26/2023) 30 capsule 0 Not Taking   Family History  Problem Relation Age of Onset   Hypertension Other    Lung cancer Other     Social History   Socioeconomic History   Marital status: Married    Spouse name: Not on file   Number of children: Not on file   Years of education: Not on file   Highest education level: Not on file  Occupational History   Not on file  Tobacco Use   Smoking status: Every Day    Current packs/day: 0.50    Types: Cigarettes   Smokeless tobacco: Never  Vaping Use   Vaping status: Never Used  Substance and Sexual Activity   Alcohol use: Yes   Drug use: No   Sexual activity: Not on file  Other Topics Concern   Not on file  Social History Narrative   Right handed   Lives with wife one story home   Social Drivers of Health   Financial Resource Strain: Not on file  Food Insecurity: No Food Insecurity (09/27/2023)   Hunger Vital Sign    Worried About Running Out of Food in the Last Year: Never true    Ran Out of Food in the Last Year: Never true  Transportation Needs: No Transportation Needs (09/27/2023)   PRAPARE - Administrator, Civil Service (Medical): No    Lack of Transportation (Non-Medical): No  Physical Activity: Not on file  Stress: Not on file  Social Connections: Unknown  (09/27/2023)   Social Connection and Isolation Panel [NHANES]    Frequency of Communication with Friends and Family: More than three times a week    Frequency of Social Gatherings with Friends and Family: More than three times a week    Attends Religious Services: Not on file    Active Member of Clubs or Organizations: Not on file    Attends Banker Meetings: Not on file    Marital Status: Not on file  Intimate Partner Violence: Unknown (09/27/2023)   Humiliation, Afraid, Rape, and Kick questionnaire  Fear of Current or Ex-Partner: No    Emotionally Abused: No    Physically Abused: Not on file    Sexually Abused: No     ROS:    As stated in the HPI and negative for all other systems.  Physical Exam: Blood pressure (!) 172/93, pulse 74, temperature 98.9 F (37.2 C), temperature source Oral, resp. rate 18, height 5\' 11"  (1.803 m), weight 99.8 kg, SpO2 97%.  GENERAL:  Well appearing HEENT:  Pupils equal round and reactive, fundi not visualized, oral mucosa unremarkable NECK:  No jugular venous distention, waveform within normal limits, carotid upstroke brisk and symmetric, no bruits, no thyromegaly LYMPHATICS:  No cervical, inguinal adenopathy LUNGS:  Clear to auscultation bilaterally BACK:  No CVA tenderness CHEST:  Unremarkable HEART:  PMI not displaced or sustained,S1 and S2 within normal limits, no S3, no S4, no clicks, no rubs, no murmurs ABD:  Flat, positive bowel sounds normal in frequency in pitch, no bruits, no rebound, no guarding, no midline pulsatile mass, no hepatomegaly, no splenomegaly EXT:  2 plus pulses throughout, no edema, no cyanosis no clubbing SKIN:  No rashes no nodules NEURO:  Cranial nerves II through XII grossly intact, motor grossly intact throughout PSYCH:  Cognitively intact, oriented to person place and time   Labs: Lab Results  Component Value Date   BUN 6 (L) 09/27/2023   Lab Results  Component Value Date   CREATININE 1.34 (H)  09/27/2023   Lab Results  Component Value Date   NA 138 09/27/2023   K 3.8 09/27/2023   CL 104 09/27/2023   CO2 25 09/27/2023   No results found for: "TROPONINI" Lab Results  Component Value Date   WBC 12.0 (H) 09/27/2023   HGB 14.5 09/27/2023   HCT 43.8 09/27/2023   MCV 93.8 09/27/2023   PLT 175 09/27/2023   Lab Results  Component Value Date   CHOL 164 09/27/2023   HDL 27 (L) 09/27/2023   LDLCALC 104 (H) 09/27/2023   LDLDIRECT 23.7 07/08/2011   TRIG 163 (H) 09/27/2023   CHOLHDL 6.1 09/27/2023   Lab Results  Component Value Date   ALT 11 09/27/2023   AST 17 09/27/2023   ALKPHOS 71 09/27/2023   BILITOT 0.9 09/27/2023     Radiology:   CXR: No active disease  EKG: 09/26/2023 sinus rhythm, 81, axis within normal limits, intervals within normal limits, no acute ST-T wave changes.   ASSESSMENT AND PLAN:   Aortic root dilatation: This was 44 mm on a TEE in 2023.   This can be followed with CTs.  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  He has not had any atrial fibrillation documented.  He has had some SVT but is regular.  He does report feeling this.  Hypertension: Blood pressure is elevated.  I would avoid beta-blockers.  Creatinine is mildly elevated.  For now I would avoid ARB, ARNI or spironolactone.  I would suggest a low-dose of amlodipine .  Syncope: He certainly does have bradycardic episodes.  He has recurrent syncope although some of this was clearly acute alcohol intoxication.  I think will be very difficult to sort out the symptoms that happened in November as it was bradycardia at that time.  This was not during the sleeping hours.  I will ask EP to see him in the morning.  Tobacco abuse:  He has been educated and is on nicotine  replacement  Signed: Eilleen Grates 09/27/2023, 5:09 PM

## 2023-09-28 ENCOUNTER — Inpatient Hospital Stay (HOSPITAL_COMMUNITY)

## 2023-09-28 DIAGNOSIS — E785 Hyperlipidemia, unspecified: Secondary | ICD-10-CM | POA: Diagnosis not present

## 2023-09-28 DIAGNOSIS — I63511 Cerebral infarction due to unspecified occlusion or stenosis of right middle cerebral artery: Secondary | ICD-10-CM | POA: Diagnosis not present

## 2023-09-28 DIAGNOSIS — I639 Cerebral infarction, unspecified: Secondary | ICD-10-CM | POA: Diagnosis not present

## 2023-09-28 DIAGNOSIS — I10 Essential (primary) hypertension: Secondary | ICD-10-CM | POA: Diagnosis not present

## 2023-09-28 DIAGNOSIS — R29704 NIHSS score 4: Secondary | ICD-10-CM | POA: Diagnosis not present

## 2023-09-28 DIAGNOSIS — I471 Supraventricular tachycardia, unspecified: Secondary | ICD-10-CM

## 2023-09-28 LAB — CBC
HCT: 44.3 % (ref 39.0–52.0)
Hemoglobin: 14.9 g/dL (ref 13.0–17.0)
MCH: 31 pg (ref 26.0–34.0)
MCHC: 33.6 g/dL (ref 30.0–36.0)
MCV: 92.3 fL (ref 80.0–100.0)
Platelets: 173 10*3/uL (ref 150–400)
RBC: 4.8 MIL/uL (ref 4.22–5.81)
RDW: 12.8 % (ref 11.5–15.5)
WBC: 13 10*3/uL — ABNORMAL HIGH (ref 4.0–10.5)
nRBC: 0 % (ref 0.0–0.2)

## 2023-09-28 LAB — BASIC METABOLIC PANEL WITH GFR
Anion gap: 12 (ref 5–15)
BUN: 9 mg/dL (ref 8–23)
CO2: 21 mmol/L — ABNORMAL LOW (ref 22–32)
Calcium: 9.2 mg/dL (ref 8.9–10.3)
Chloride: 100 mmol/L (ref 98–111)
Creatinine, Ser: 1.49 mg/dL — ABNORMAL HIGH (ref 0.61–1.24)
GFR, Estimated: 51 mL/min — ABNORMAL LOW (ref >60)
Glucose, Bld: 95 mg/dL (ref 70–99)
Potassium: 3.8 mmol/L (ref 3.5–5.1)
Sodium: 133 mmol/L — ABNORMAL LOW (ref 135–145)

## 2023-09-28 LAB — MAGNESIUM: Magnesium: 1.8 mg/dL (ref 1.7–2.4)

## 2023-09-28 LAB — GLUCOSE, CAPILLARY
Glucose-Capillary: 100 mg/dL — ABNORMAL HIGH (ref 70–99)
Glucose-Capillary: 101 mg/dL — ABNORMAL HIGH (ref 70–99)
Glucose-Capillary: 102 mg/dL — ABNORMAL HIGH (ref 70–99)
Glucose-Capillary: 112 mg/dL — ABNORMAL HIGH (ref 70–99)
Glucose-Capillary: 117 mg/dL — ABNORMAL HIGH (ref 70–99)

## 2023-09-28 LAB — PATHOLOGIST SMEAR REVIEW

## 2023-09-28 MED ORDER — SODIUM CHLORIDE 0.9 % IV BOLUS
500.0000 mL | Freq: Once | INTRAVENOUS | Status: AC
  Administered 2023-09-28: 500 mL via INTRAVENOUS

## 2023-09-28 MED ORDER — SODIUM CHLORIDE 0.9 % IV SOLN: INTRAVENOUS | Status: AC

## 2023-09-28 NOTE — Progress Notes (Addendum)
 Patient is back to the unit.

## 2023-09-28 NOTE — Hospital Course (Signed)
 Patient with PMH of cryptogenic stroke, hep C, HTN, alcohol abuse, active smoker, seizures, gout, recurrent syncope presented to the hospital with complaints of slurred speech. Patient was out of the duration for tPA Neurology was consulted.  Further workup initiated. Incidentally was found to have episodes of SVT as well as prolonged sinus pauses associated with his recurrent syncopal events in the past on ILR interrogation.  Cardiology consulted.  Assessment and Plan: Recurrent acute ischemic infarct. Right posterior MCA territory.  CT code stroke wedge-shaped area of edema in the right.  Occipital region. MRI brain confirms acute right MCA territory infarct with cerebral edema without mass effect. Neurology consulted. Hypercoagulable workup initiated which so far is negative. CT chest Abdo pelvis to look for occult malignancy is also negative. Echocardiogram EF normal, grade 1 diastolic dysfunction, no significant valvular abnormality. LDL 63.  Hemoglobin A1c 4.4. Patient was on 81 mg aspirin  although noncompliant. Current recommendation is 81 mg aspirin  plus Plavix  for 3 weeks followed by 8 aspirin  alone. Actively smokes, recommended to quit smoking. Due to ongoing complaints of confusion a CT head was performed which is unremarkable for a new stroke. PT was able to see finally patient on 5/6.  Recommending SNF.  TOC consulted.   Suspected COPD with exacerbation. Acute sinusitis and bronchitis Patient reports sinus headache.  Bilateral expiratory wheezing on exam.  CT shows evidence of bronchitis. Continue Augmentin .  Continue nebulizer.     HTN. On metoprolol  at home.  Blood pressure stable.  Continue.   HLD. Goal less than 70, currently LDL is 63. Continue statin atorvastatin  20 mg.   Active smoker. Smokes 3 to 4 cigarettes a day. Patient currently willing to quit.  Nicotine  therapy on discharge.   Alcohol abuse. Acute encephalopathy Alcohol level is 167.  Recommended to  stop abusing alcohol in the setting of SVTs. Somewhat confused on 5/6 and 5/5.  Although no significant withdrawal, delirium cannot be ruled out.  No tremors. Initiating CIWA protocol with Librium.   Obesity Class 1 Body mass index is 30.68 kg/m.  Placing the pt at higher risk of poor outcomes. Recommended weight loss to reduce the risk of the stroke.   SVT Sinus pause. Patient has a loop recorder placed for cryptogenic stroke. In April 2024 patient presented with a syncopal event.  This was consistent with SVT seen on the ILR. Patient also had an ED visit in November 2024 for reported 'flat lining' episode requiring "CPR" at home.  This is concurrent with his episode of sinus pause seen on ILR. Cardiology consulted.  EP consulted.  Recommend continuous observation but no intervention. Appreciate assistance.

## 2023-09-28 NOTE — TOC Initial Note (Signed)
 Transition of Care [facility]) - Initial/Assessment Note    Patient Details  Name: Ryan Montgomery MRN: [id number] Date of Birth: 10/10/1955  Transition of Care [facility]) CM/SW Contact:    Jonathan Neighbor, RN Phone Number: 09/28/2023, 4:07 PM  Clinical Narrative:                  CM met with the patient. He did have some slow responses. Pt lives alone but has a caregiver Tues-Friday for 4 hours each day. States his brother and daughter do check on him. Pt manages his own medications.  Pt states his caregiver provides needed transportation. Awaiting therapy recommendations. TOC following.  Expected Discharge Plan: IP Rehab Facility Barriers to Discharge: Continued Medical Work up   Patient Goals and CMS Choice   CMS Medicare.gov Compare Post Acute Care list provided to:: Patient Choice offered to / list presented to : Patient, Adult Children      Expected Discharge Plan and Services   Discharge Planning Services: CM Consult   Living arrangements for the past 2 months: Apartment                                      Prior Living Arrangements/Services Living arrangements for the past 2 months: Apartment Lives with:: Self Patient language and need for interpreter reviewed:: Yes Do you feel safe going back to the place where you live?: Yes          Current home services: DME, Homehealth aide (cane/ walker/ shower seat) Criminal Activity/Legal Involvement Pertinent to Current Situation/Hospitalization: No - Comment as needed  Activities of Daily Living      Permission Sought/Granted                  Emotional Assessment Appearance:: Appears stated age Attitude/Demeanor/Rapport: Engaged Affect (typically observed): Accepting Orientation: : Oriented to Self, Oriented to Place, Oriented to Situation   Psych Involvement: No (comment)  Admission diagnosis:  Acute CVA (cerebrovascular accident) (HCC) [I63.9] Cerebral infarction, unspecified mechanism (HCC)  [I63.9] Patient Active Problem List   Diagnosis Date Noted   HTN (hypertension) 04/27/2023   Aortic dilatation (HCC) 06/04/2021   Dysphagia 06/04/2021   Acute CVA (cerebrovascular accident) (HCC) 06/03/2021   AKI (acute kidney injury) (HCC) 06/03/2021   Tobacco use 06/03/2021   Chronic pain in left foot 05/14/2021   Rib fractures 09/09/2020   Chronic left hip pain 12/09/2019   Seizure disorder (HCC)    Syncope 11/27/2019   Alcohol abuse 08/29/2019   Gout 08/29/2019   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   Unspecified fracture of left acetabulum, initial encounter for closed fracture (HCC) 04/03/2019   History of CVA (cerebrovascular accident) 2018   Hepatitis C 07/18/2013   PCP:  Donley Furth, MD Pharmacy:   [id number] [place], [place] - [address] AT [facility] [address] [place] [address] Phone: [phone] Fax: [phone]     Social Drivers of Health (SDOH) Social History: SDOH Screenings   Food Insecurity: No Food Insecurity (09/27/2023)  Housing: Unknown (09/27/2023)  Transportation Needs: No Transportation Needs (09/27/2023)  Utilities: Not At Risk (09/27/2023)  Depression (PHQ2-9): Low Risk  (12/30/2022)  Social Connections: Unknown (09/27/2023)  Tobacco Use: High Risk (09/26/2023)   SDOH Interventions:     Readmission Risk Interventions     No data to display

## 2023-09-28 NOTE — Progress Notes (Signed)
 OT Cancellation Note  Patient Details Name: Kyung Woodman MRN: [id number] DOB: 03/16/1956   Cancelled Treatment:    Reason Eval/Treat Not Completed: Patient at procedure or test/ unavailable (CT) OT will continue to follow acutely for eval  Quincy Buba 09/28/2023, 1:31 PM  [place] OTR/L Acute Rehabilitation Services Office: [phone]

## 2023-09-28 NOTE — Progress Notes (Signed)
 [facility] Progress Note Patient: Ryan Montgomery ZOX:[id number] DOB: Oct 06, 1955 DOA: 09/26/2023  DOS: the patient was seen and examined on 09/28/2023  Brief Hospital Course: Patient with PMH of cryptogenic stroke, hep C, HTN, alcohol abuse, active smoker, seizures, gout, recurrent syncope presented to the hospital with complaints of slurred speech. Patient was out of the duration for tPA Neurology was consulted.  Further workup initiated. Incidentally was found to have episodes of SVT as well as prolonged sinus pauses associated with his recurrent syncopal events in the past on ILR interrogation.  Cardiology consulted.  Assessment and Plan: Recurrent acute ischemic infarct. Right posterior MCA territory.  CT code stroke wedge-shaped area of edema in the right.  Occipital region. MRI brain confirms acute right MCA territory infarct with cerebral edema without mass effect. Neurology consulted. Hypercoagulable workup initiated which so far is negative. CT chest Abdo pelvis to look for occult malignancy is also negative. Echocardiogram EF normal, grade 1 diastolic dysfunction, no significant valvular abnormality. LDL 63.  Hemoglobin A1c 4.4. Patient was on 81 mg aspirin  although noncompliant. Current recommendation is 81 mg aspirin  plus Plavix  for 3 weeks followed by 8 aspirin  alone. Actively smokes, recommended to quit smoking. Due to ongoing complaints of confusion a CT head was performed which is unremarkable for a new stroke.   Suspected COPD with exacerbation. Acute sinusitis and bronchitis Patient reports sinus headache.  Bilateral expiratory wheezing on exam.  CT shows evidence of bronchitis. Continue Augmentin .  Continue nebulizer.     HTN. On metoprolol  at home.  Blood pressure stable.  Continue.   HLD. Goal less than 70, currently LDL is 63. Continue statin atorvastatin  20 mg.   Active smoker. Smokes 3 to 4 cigarettes a day. Patient currently willing to quit.  Nicotine   therapy on discharge.   Alcohol abuse. Alcohol level is 167.  Recommended to stop abusing alcohol in the setting of SVTs.   Obesity Class 1 Body mass index is 30.68 kg/m.  Placing the pt at higher risk of poor outcomes. Recommended weight loss to reduce the risk of the stroke.   SVT Sinus pause. Patient has a loop recorder placed for cryptogenic stroke. In April 2024 patient presented with a syncopal event.  This was consistent with SVT seen on the ILR. Patient also had an ED visit in November 2024 for reported 'flat lining' episode requiring "CPR" at home.  This is concurrent with his episode of sinus pause seen on ILR. Cardiology consulted.  EP consulted.  Recommend continuous observation but no intervention. Appreciate assistance.    AKI. Baseline serum creatinine appears to be normal 1.1. On admission serum creatinine was 1.2. Currently serum creatinine is 1.49. Will treat with IV fluid and monitor for  Subjective: No nausea no vomiting no fever no chills, no chest pain.  Physical Exam: General: in Mild distress, No Rash Cardiovascular: S1 and S2 Present, No Murmur Respiratory: Good respiratory effort, Bilateral Air entry present. No Crackles, No wheezes Abdomen: Bowel Sound present, No tenderness Extremities: No edema Neuro: Alert and oriented x3, no new focal deficit, having difficulty following commands.  Left homonymous hemianopia unchanged from admission.  Data Reviewed: I have Reviewed nursing notes, Vitals, and Lab results. Since last encounter, pertinent lab results CBC and BMP   . I have ordered test including CBC and BMP  .  Ordered CT head.  Unresulted Labs (From admission, onward)     Start     Ordered   09/28/23 0500  CBC  Daily,  R     Question:  Specimen collection method  Answer:  Lab=Lab collect   09/27/23 1927   09/28/23 0500  Basic metabolic panel with GFR  Daily,   R     Question:  Specimen collection method  Answer:  Lab=Lab collect   09/27/23  1927   09/28/23 0500  Magnesium   Daily,   R     Question:  Specimen collection method  Answer:  Lab=Lab collect   09/27/23 1927            Disposition: Status is: Inpatient Remains inpatient appropriate because: Getting IV fluid.  enoxaparin  (LOVENOX ) injection 40 mg Start: 09/26/23 2200   Family Communication: Family at bedside Level of care: Telemetry Medical   Vitals:   09/28/23 0835 09/28/23 1410 09/28/23 1500 09/28/23 2000  BP:  (!) 151/83 (!) 140/86 126/70  Pulse: (!) 115 93 92 86  Resp: 20 18 20 18   Temp:  99.2 F (37.3 C) 99.3 F (37.4 C) 98 F (36.7 C)  TempSrc:  Oral Oral   SpO2: 98% 97% 97% 97%  Weight:      Height:         Author: Charlean Congress, MD 09/28/2023 8:16 PM  Please look on [email or web address] to find out who is on call.

## 2023-09-28 NOTE — Progress Notes (Signed)
 STROKE TEAM PROGRESS NOTE   INTERIM HISTORY/SUBJECTIVE Patient lying in bed comfortably.  Cardiologist is at the bedside.  Continues to have neurological deficits which is unchanged.  Loop recorder interrogation has not shown any A-fib.  CT scan chest abdomen pelvis was negative for occult malignancy.  Echocardiogram showed ejection fraction 60 to 65% and left atrial size was normal. OBJECTIVE  CBC    Component Value Date/Time   WBC 13.0 (H) 09/28/2023 0611   RBC 4.80 09/28/2023 0611   HGB 14.9 09/28/2023 0611   HCT 44.3 09/28/2023 0611   PLT 173 09/28/2023 0611   MCV 92.3 09/28/2023 0611   MCH 31.0 09/28/2023 0611   MCHC 33.6 09/28/2023 0611   RDW 12.8 09/28/2023 0611   LYMPHSABS 5.7 (H) 09/26/2023 1250   MONOABS 0.7 09/26/2023 1250   EOSABS 0.1 09/26/2023 1250   BASOSABS 0.0 09/26/2023 1250    BMET    Component Value Date/Time   NA 133 (L) 09/28/2023 0611   K 3.8 09/28/2023 0611   CL 100 09/28/2023 0611   CO2 21 (L) 09/28/2023 0611   GLUCOSE 95 09/28/2023 0611   BUN 9 09/28/2023 0611   CREATININE 1.49 (H) 09/28/2023 0611   CALCIUM  9.2 09/28/2023 0611   GFRNONAA 51 (L) 09/28/2023 0611    IMAGING past 24 hours ECHOCARDIOGRAM COMPLETE BUBBLE STUDY Result Date: 09/27/2023    ECHOCARDIOGRAM REPORT   Patient Name:   Ryan Montgomery Date of Exam: 09/27/2023 Medical Rec #:  [id number]       Height:       71.0 in Accession #:    [id number]      Weight:       220.0 lb Date of Birth:  [birth year]        BSA:          2.196 m Patient Age:    67 years        BP:           159/93 mmHg Patient Gender: M               HR:           71 bpm. Exam Location:  Inpatient Procedure: 2D Echo (Both Spectral and Color Flow Doppler were utilized during            procedure). Indications:    stroke  History:        Patient has prior history of Echocardiogram examinations, most                 recent 06/10/2021. Risk Factors:Current Smoker, Hypertension and                 Alcohol abuse.  Sonographer:     Dione Franks RDCS Referring Phys: [id number] DAVID MANUEL ORTIZ IMPRESSIONS  1. Left ventricular ejection fraction, by estimation, is 60 to 65%. The left ventricle has normal function. The left ventricle has no regional wall motion abnormalities. There is mild left ventricular hypertrophy. Left ventricular diastolic parameters are consistent with Grade I diastolic dysfunction (impaired relaxation).  2. Right ventricular systolic function is normal. The right ventricular size is normal.  3. The mitral valve is normal in structure. No evidence of mitral valve regurgitation. No evidence of mitral stenosis.  4. The aortic valve is tricuspid. Aortic valve regurgitation is mild. No aortic stenosis is present.  5. Aortic dilatation noted. There is moderate dilatation of the aortic root, measuring 45 mm. There is mild dilatation of the ascending aorta, measuring  38 mm.  6. The inferior vena cava is normal in size with greater than 50% respiratory variability, suggesting right atrial pressure of 3 mmHg.  7. Agitated saline contrast bubble study was negative, with no evidence of any interatrial shunt. FINDINGS  Left Ventricle: Left ventricular ejection fraction, by estimation, is 60 to 65%. The left ventricle has normal function. The left ventricle has no regional wall motion abnormalities. The left ventricular internal cavity size was normal in size. There is  mild left ventricular hypertrophy. Left ventricular diastolic parameters are consistent with Grade I diastolic dysfunction (impaired relaxation). Normal left ventricular filling pressure. Right Ventricle: The right ventricular size is normal. Right vetricular wall thickness was not well visualized. Right ventricular systolic function is normal. Left Atrium: Left atrial size was normal in size. Right Atrium: Right atrial size was normal in size. Pericardium: There is no evidence of pericardial effusion. Mitral Valve: The mitral valve is normal in structure. No  evidence of mitral valve regurgitation. No evidence of mitral valve stenosis. Tricuspid Valve: The tricuspid valve is normal in structure. Tricuspid valve regurgitation is not demonstrated. No evidence of tricuspid stenosis. Aortic Valve: The aortic valve is tricuspid. Aortic valve regurgitation is mild. Aortic regurgitation PHT measures 416 msec. No aortic stenosis is present. Aortic valve mean gradient measures 2.8 mmHg. Aortic valve peak gradient measures 4.7 mmHg. Aortic  valve area, by VTI measures 3.36 cm. Pulmonic Valve: The pulmonic valve was not well visualized. Pulmonic valve regurgitation is not visualized. No evidence of pulmonic stenosis. Aorta: Aortic dilatation noted. There is moderate dilatation of the aortic root, measuring 45 mm. There is mild dilatation of the ascending aorta, measuring 38 mm. Venous: The inferior vena cava is normal in size with greater than 50% respiratory variability, suggesting right atrial pressure of 3 mmHg. IAS/Shunts: No atrial level shunt detected by color flow Doppler. Agitated saline contrast was given intravenously to evaluate for intracardiac shunting. Agitated saline contrast bubble study was negative, with no evidence of any interatrial shunt.  LEFT VENTRICLE PLAX 2D LVIDd:         3.90 cm     Diastology LVIDs:         3.10 cm     LV e' medial:    4.13 cm/s LV PW:         1.20 cm     LV E/e' medial:  8.8 LV IVS:        1.20 cm     LV e' lateral:   7.07 cm/s LVOT diam:     2.40 cm     LV E/e' lateral: 5.2 LV SV:         78 LV SV Index:   36 LVOT Area:     4.52 cm  LV Volumes (MOD) LV vol d, MOD A2C: 90.6 ml LV vol s, MOD A2C: 49.5 ml LV SV MOD A2C:     41.1 ml RIGHT VENTRICLE             IVC RV Basal diam:  2.40 cm     IVC diam: 1.70 cm RV S prime:     10.20 cm/s TAPSE (M-mode): 1.7 cm LEFT ATRIUM             Index        RIGHT ATRIUM           Index LA diam:        3.80 cm 1.73 cm/m   RA Area:     12.60 cm  LA Vol (A2C):   43.3 ml 19.72 ml/m  RA Volume:   26.10  ml  11.89 ml/m LA Vol (A4C):   46.1 ml 21.00 ml/m LA Biplane Vol: 47.0 ml 21.41 ml/m  AORTIC VALVE AV Area (Vmax):    3.58 cm AV Area (Vmean):   3.47 cm AV Area (VTI):     3.36 cm AV Vmax:           108.59 cm/s AV Vmean:          75.935 cm/s AV VTI:            0.233 m AV Peak Grad:      4.7 mmHg AV Mean Grad:      2.8 mmHg LVOT Vmax:         85.90 cm/s LVOT Vmean:        58.200 cm/s LVOT VTI:          0.173 m LVOT/AV VTI ratio: 0.74 AI PHT:            416 msec  AORTA Ao Root diam: 4.50 cm Ao Asc diam:  3.80 cm MITRAL VALVE MV Area (PHT): 2.39 cm    SHUNTS MV Decel Time: 317 msec    Systemic VTI:  0.17 m MV E velocity: 36.50 cm/s  Systemic Diam: 2.40 cm MV A velocity: 70.40 cm/s MV E/A ratio:  0.52 Armida Lander MD Electronically signed by Armida Lander MD Signature Date/Time: 09/27/2023/5:32:26 PM    Final     Vitals:   09/27/23 2329 09/28/23 0400 09/28/23 0814 09/28/23 0835  BP: 139/82 (!) 146/90 (!) 134/93   Pulse: 79 91 (!) 115 (!) 115  Resp: 18 18 19 20   Temp: (!) 97.5 F (36.4 C) 98.9 F (37.2 C) 99.6 F (37.6 C)   TempSrc: Oral Oral Oral   SpO2: 93% 97% 98% 98%  Weight:      Height:         PHYSICAL EXAM General:  Alert, well-nourished, well-developed patient in no acute distress Psych:  Mood and affect appropriate for situation CV: Regular rate and rhythm on monitor Respiratory:  Regular, unlabored respirations on room air GI: Abdomen soft and nontender   NEURO:  Mental Status: AA&Ox3, patient is able to give clear and coherent history Speech/Language: speech is without dysarthria or aphasia.  Naming, repetition, fluency, and comprehension intact.  Cranial Nerves:  II: PERRL. Visual fields full.  III, IV, VI: EOMI. Eyelids elevate symmetrically.  V: Sensation is intact to light touch and symmetrical to face.  VII: left facial droop VIII: hearing intact to voice. IX, X: Palate elevates symmetrically. Phonation is normal.  ZO:[id number] shrug 5/5. XII: tongue is  midline without fasciculations. Motor: RUE and RLE full strength Mild left lower extremity weankess,   Tone: is slightly increased in right hand Sensation- Intact to light touch bilaterally. Extinction absent to light touch to DSS.   Coordination:orbits. Slow RAM on the left.  Gait- deferred  ASSESSMENT/PLAN  Mr. Ryan Montgomery is a 68 y.o. 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  NIH on Admission 4  Acute Ischemic Infarct:  right posterior MCA territory infarcts  Etiology: Recurrent cryptogenic  Code Stroke CT head - Wedge-shaped area of peripheral edema with loss of gray-white differentiation in the posterior right parietooccipital region is consistent with acute to subacute infarct. No evidence for hemorrhagic transformation. No  midline shift. Stable appearance of old right frontal infarct, bilateral cerebellar infarcts, basal ganglia lacunar infarcts and chronic microvascular ischemic demyelination. CTA head & neck Occlusion of the proximal right M1 MCA with diminished more distal M1 MCA reconstitution. Asymmetrically poor opacification of the more distal right MCA vessels. Moderate right paraclinoid ICA and distal left M1 MCA stenosis. MRI  Acute, predominantly posterior right MCA territory infarcts. Edema without significant mass effect. CT CAP-  No evidence of malignancy or metastatic disease in the chest, abdomen, or pelvis. 2D Echo ejection fraction 60 to 65%.  Left atrial size normal. LDL 63 HgbA1c 4.4 VTE prophylaxis - lovenox  aspirin  81 mg daily (non compliant) prior to admission, now on aspirin  81 mg daily and clopidogrel  75 mg daily for 3 weeks and then ASA alone. Therapy recommendations:  Pending Disposition:  Pending   Hx of Stroke/TIA 2023- right pontine and left watershed scattered infarcts, embolic pattern secondary to  unclear source  TEE tiny PFO.  No clot  Hypercoagulable work-up Homocystein 18.0, Lupus anticoagulant negative, ANA wnl, HIV negative, TSH wnl, B12 wnl, RPR negative Loop recorder placed- no afib for 3 months loop was transmitting, needs to be re interrogated  Hypertension Home meds: Metoprolol  Stable Blood Pressure Goal: BP less than 220/110   Hyperlipidemia Home meds: Atorvastatin  20 mg, resumed in hospital LDL 63, goal < 70 Continue statin at discharge  Tobacco Abuse Patient smokes        Ready to quit? Yes Nicotine  replacement therapy provided  Other Stroke Risk Factors ETOH use, alcohol level 167, advised to drink no more than 2 drink(s) a day Obesity, Body mass index is 30.68 kg/m., BMI >/= 30 associated with increased stroke risk, recommend weight loss, diet and exercise as appropriate   Hospital day # 2    Patient with prior history of cryptogenic strokes in 2023 with negative extensive evaluation including loop recorder.  He presents with recurrent right MCA branch patchy infarct.  Recommend dual antiplatelet therapy aspirin  Plavix  3 weeks followed by aspirin  alone as he was noncompliant with it.   Mobilize out of bed.  Therapy consults.  Long discussion with patient about evaluation and treatment and answered questions.  Discussed with Dr. Lavonne Prairie and Dr. Lydia Sams. I have spent a total of  with the patient reviewing hospital notes,  test results, labs and examining the patient as well as establishing an assessment and plan that was discussed personally with the patient.  > 50% of time was spent in direct patient care.  Stroke team will sign off.  Follow-up as an outpatient stroke clinic in 2 months      Ardella Beaver, MD Medical Director [facility] Pager: [phone] 09/28/2023 2:04 PM   To contact Stroke Continuity provider, please refer to [email or web address]. After hours, contact General Neurology

## 2023-09-28 NOTE — Consult Note (Addendum)
 ELECTROPHYSIOLOGY CONSULT NOTE    Patient ID: Ryan Montgomery MRN: [id number], DOB/AGE: 1955/11/01 68 y.o.  Admit date: 09/26/2023 Date of Consult: 09/28/2023  Primary Physician: Donley Furth, MD Primary Cardiologist: None  Electrophysiologist: Dr. Lawana Pray   Referring Provider: Dr. Lavonne Prairie  Patient Profile: Ryan Montgomery is a 68 y.o. male with a history of cryptogenic CVA, HTN hepatitis C+ & ETOH abuse who is being seen today for the evaluation of AF / Pauses at the request of Dr. Lavonne Prairie.  HPI:  Ryan Montgomery is a 68 y.o. male who was admitted on 09/26/23 with slurred speech & facial droop. CT code stroke wedge-shaped area of edema in the right occipital region. MRI brain showed patchy subacute right posterior MCA infarct. Hypercoagulable work up negative thus far.  CT chest/abd/pelvis negative for occult malignancy.  LDL 63, HgbA1c 4.4.  He reports he has not drank alcohol since December.  However, his alcohol level was 167 on admission.  He reports he passed out at his cousins funeral in November but none since.  He has difficulty with memory / recalling events on interview.    Cardiology was consulted after loop recorder was interrogated and showed possible pauses and SVT.  See ILR interrogation below.   He denies chest pain, palpitations, dyspnea, PND, orthopnea, nausea, vomiting, dizziness, syncope, edema, weight gain, or early satiety.   Labs Potassium3.8 (05/05 4098) Magnesium   1.8 (05/05 1191) Creatinine, ser  1.49* (05/05 0611) PLT  173 (05/05 0611) HGB  14.9 (05/05 0611) WBC 13.0* (05/05 4782)  .    Past Medical History:  Diagnosis Date   Chickenpox    Heat stroke    Hepatitis C    Stroke [facility])      Surgical History:  Past Surgical History:  Procedure Laterality Date   BUBBLE STUDY  06/10/2021   Procedure: BUBBLE STUDY;  Surgeon: Harrold Lincoln, MD;  Location: [facility] ENDOSCOPY;  Service: Cardiovascular;;   colonoscopy  08-28-08   per Dr. Adan Holms,  clear, repeat in 10 yrs    fx rt ankle     pins placed   JOINT REPLACEMENT Left 09/12/2019   LOOP RECORDER INSERTION N/A 06/10/2021   Procedure: LOOP RECORDER INSERTION;  Surgeon: Lei Pump, MD;  Location: [facility] INVASIVE CV LAB;  Service: Cardiovascular;  Laterality: N/A;   TEE WITHOUT CARDIOVERSION N/A 06/10/2021   Procedure: TRANSESOPHAGEAL ECHOCARDIOGRAM (TEE);  Surgeon: Harrold Lincoln, MD;  Location: [facility] ENDOSCOPY;  Service: Cardiovascular;  Laterality: N/A;   TOTAL HIP ARTHROPLASTY Left 09/12/2019   per Dr. Awanda Bogaert at [facility]      Medications Prior to Admission  Medication Sig Dispense Refill Last Dose/Taking   ADVIL  200 MG tablet Take 200-400 mg by mouth every 6 (six) hours as needed (for sinus headaches or headaches).   Past Week   celecoxib  (CELEBREX ) 200 MG capsule Take 1 capsule (200 mg total) by mouth 2 (two) times daily as needed for moderate pain (pain score 4-6). 60 capsule 5 Past Week   Dextran 70-Hypromellose (ARTIFICIAL TEARS PF OP) Place 1 drop into both eyes 3 (three) times daily as needed (for dryness).   Unknown   loratadine (CLARITIN) 10 MG tablet Take 10 mg by mouth daily as needed for allergies or rhinitis.   Unknown   metoprolol  succinate (TOPROL -XL) 25 MG 24 hr tablet Take 1 tablet (25 mg total) by mouth daily. 30 tablet 2 09/23/2023   TYLENOL  500 MG tablet Take 500-1,000 mg by mouth every 6 (six) hours  as needed (for pain).   Past Week   amoxicillin -clavulanate (AUGMENTIN ) 875-125 MG tablet Take 1 tablet by mouth 2 (two) times daily. (Patient not taking: Reported on 09/26/2023) 20 tablet 0 Not Taking   aspirin  EC 81 MG EC tablet Take 1 tablet (81 mg total) by mouth daily. Swallow whole. (Patient not taking: Reported on 09/26/2023)   Not Taking   atorvastatin  (LIPITOR) 20 MG tablet Take 1 tablet (20 mg total) by mouth daily. (Patient not taking: Reported on 09/26/2023)   Not Taking   tamsulosin  (FLOMAX ) 0.4 MG CAPS capsule Take 1 capsule (0.4 mg total)  by mouth daily after supper. (Patient not taking: Reported on 09/26/2023) 30 capsule 0 Not Taking    Inpatient Medications:   amLODipine   2.5 mg Oral Daily   amoxicillin -clavulanate  1 tablet Oral Q12H   aspirin  EC  81 mg Oral Daily   clopidogrel   75 mg Oral Daily   enoxaparin  (LOVENOX ) injection  40 mg Subcutaneous Q24H   fluticasone  2 spray Each Nare Daily   fluticasone furoate-vilanterol  1 puff Inhalation Daily    Allergies:  Allergies  Allergen Reactions   Moxifloxacin Hives    Family History  Problem Relation Age of Onset   Hypertension Other    Lung cancer Other      Physical Exam: Vitals:   09/27/23 2329 09/28/23 0400 09/28/23 0814 09/28/23 0835  BP: 139/82 (!) 146/90 (!) 134/93   Pulse: 79 91 (!) 115 (!) 115  Resp: 18 18 19 20   Temp: (!) 97.5 F (36.4 C) 98.9 F (37.2 C) 99.6 F (37.6 C)   TempSrc: Oral Oral Oral   SpO2: 93% 97% 98% 98%  Weight:      Height:        GEN- chronically ill appearing adult male lying in bed in NAD, awake/alert, speech clear, memory deficits / recall of event not clear, normal affect HEENT: Normocephalic, atraumatic Lungs- CTAB, Normal effort.  Heart- Regular rate and rhythm, No M/G/R.  GI- Soft, NT, ND.  Extremities- No clubbing, cyanosis, or edema   Radiology/Studies: ECHOCARDIOGRAM COMPLETE BUBBLE STUDY Result Date: 09/27/2023    ECHOCARDIOGRAM REPORT   Patient Name:   Ryan Montgomery Date of Exam: 09/27/2023 Medical Rec #:  [id number]       Height:       71.0 in Accession #:    [id number]      Weight:       220.0 lb Date of Birth:  10-04-1955        BSA:          2.196 m Patient Age:    67 years        BP:           159/93 mmHg Patient Gender: M               HR:           71 bpm. Exam Location:  Inpatient Procedure: 2D Echo (Both Spectral and Color Flow Doppler were utilized during            procedure). Indications:    stroke  History:        Patient has prior history of Echocardiogram examinations, most                 recent  06/10/2021. Risk Factors:Current Smoker, Hypertension and                 Alcohol abuse.  Sonographer:  Dione Franks RDCS Referring Phys: [id number] DAVID MANUEL ORTIZ IMPRESSIONS  1. Left ventricular ejection fraction, by estimation, is 60 to 65%. The left ventricle has normal function. The left ventricle has no regional wall motion abnormalities. There is mild left ventricular hypertrophy. Left ventricular diastolic parameters are consistent with Grade I diastolic dysfunction (impaired relaxation).  2. Right ventricular systolic function is normal. The right ventricular size is normal.  3. The mitral valve is normal in structure. No evidence of mitral valve regurgitation. No evidence of mitral stenosis.  4. The aortic valve is tricuspid. Aortic valve regurgitation is mild. No aortic stenosis is present.  5. Aortic dilatation noted. There is moderate dilatation of the aortic root, measuring 45 mm. There is mild dilatation of the ascending aorta, measuring 38 mm.  6. The inferior vena cava is normal in size with greater than 50% respiratory variability, suggesting right atrial pressure of 3 mmHg.  7. Agitated saline contrast bubble study was negative, with no evidence of any interatrial shunt. FINDINGS  Left Ventricle: Left ventricular ejection fraction, by estimation, is 60 to 65%. The left ventricle has normal function. The left ventricle has no regional wall motion abnormalities. The left ventricular internal cavity size was normal in size. There is  mild left ventricular hypertrophy. Left ventricular diastolic parameters are consistent with Grade I diastolic dysfunction (impaired relaxation). Normal left ventricular filling pressure. Right Ventricle: The right ventricular size is normal. Right vetricular wall thickness was not well visualized. Right ventricular systolic function is normal. Left Atrium: Left atrial size was normal in size. Right Atrium: Right atrial size was normal in size. Pericardium: There  is no evidence of pericardial effusion. Mitral Valve: The mitral valve is normal in structure. No evidence of mitral valve regurgitation. No evidence of mitral valve stenosis. Tricuspid Valve: The tricuspid valve is normal in structure. Tricuspid valve regurgitation is not demonstrated. No evidence of tricuspid stenosis. Aortic Valve: The aortic valve is tricuspid. Aortic valve regurgitation is mild. Aortic regurgitation PHT measures 416 msec. No aortic stenosis is present. Aortic valve mean gradient measures 2.8 mmHg. Aortic valve peak gradient measures 4.7 mmHg. Aortic  valve area, by VTI measures 3.36 cm. Pulmonic Valve: The pulmonic valve was not well visualized. Pulmonic valve regurgitation is not visualized. No evidence of pulmonic stenosis. Aorta: Aortic dilatation noted. There is moderate dilatation of the aortic root, measuring 45 mm. There is mild dilatation of the ascending aorta, measuring 38 mm. Venous: The inferior vena cava is normal in size with greater than 50% respiratory variability, suggesting right atrial pressure of 3 mmHg. IAS/Shunts: No atrial level shunt detected by color flow Doppler. Agitated saline contrast was given intravenously to evaluate for intracardiac shunting. Agitated saline contrast bubble study was negative, with no evidence of any interatrial shunt.  LEFT VENTRICLE PLAX 2D LVIDd:         3.90 cm     Diastology LVIDs:         3.10 cm     LV e' medial:    4.13 cm/s LV PW:         1.20 cm     LV E/e' medial:  8.8 LV IVS:        1.20 cm     LV e' lateral:   7.07 cm/s LVOT diam:     2.40 cm     LV E/e' lateral: 5.2 LV SV:         78 LV SV Index:   36 LVOT Area:  4.52 cm  LV Volumes (MOD) LV vol d, MOD A2C: 90.6 ml LV vol s, MOD A2C: 49.5 ml LV SV MOD A2C:     41.1 ml RIGHT VENTRICLE             IVC RV Basal diam:  2.40 cm     IVC diam: 1.70 cm RV S prime:     10.20 cm/s TAPSE (M-mode): 1.7 cm LEFT ATRIUM             Index        RIGHT ATRIUM           Index LA diam:         3.80 cm 1.73 cm/m   RA Area:     12.60 cm LA Vol (A2C):   43.3 ml 19.72 ml/m  RA Volume:   26.10 ml  11.89 ml/m LA Vol (A4C):   46.1 ml 21.00 ml/m LA Biplane Vol: 47.0 ml 21.41 ml/m  AORTIC VALVE AV Area (Vmax):    3.58 cm AV Area (Vmean):   3.47 cm AV Area (VTI):     3.36 cm AV Vmax:           108.59 cm/s AV Vmean:          75.935 cm/s AV VTI:            0.233 m AV Peak Grad:      4.7 mmHg AV Mean Grad:      2.8 mmHg LVOT Vmax:         85.90 cm/s LVOT Vmean:        58.200 cm/s LVOT VTI:          0.173 m LVOT/AV VTI ratio: 0.74 AI PHT:            416 msec  AORTA Ao Root diam: 4.50 cm Ao Asc diam:  3.80 cm MITRAL VALVE MV Area (PHT): 2.39 cm    SHUNTS MV Decel Time: 317 msec    Systemic VTI:  0.17 m MV E velocity: 36.50 cm/s  Systemic Diam: 2.40 cm MV A velocity: 70.40 cm/s MV E/A ratio:  0.52 Armida Lander MD Electronically signed by Armida Lander MD Signature Date/Time: 09/27/2023/5:32:26 PM    Final    CT CHEST ABDOMEN PELVIS W CONTRAST Result Date: 09/27/2023 CLINICAL DATA:  Malignancy screening * Tracking Code: BO * EXAM: CT CHEST, ABDOMEN, AND PELVIS WITH CONTRAST TECHNIQUE: Multidetector CT imaging of the chest, abdomen and pelvis was performed following the standard protocol during bolus administration of intravenous contrast. RADIATION DOSE REDUCTION: This exam was performed according to the departmental dose-optimization program which includes automated exposure control, adjustment of the mA and/or kV according to patient size and/or use of iterative reconstruction technique. CONTRAST:  75mL OMNIPAQUE  IOHEXOL  350 MG/ML SOLN COMPARISON:  09/09/2020 FINDINGS: CT CHEST FINDINGS Cardiovascular: Aortic atherosclerosis. Normal heart size. Left and right coronary artery calcifications. No pericardial effusion. Mediastinum/Nodes: No enlarged mediastinal, hilar, or axillary lymph nodes. Thyroid gland, trachea, and esophagus demonstrate no significant findings. Lungs/Pleura: Diffuse bilateral bronchial  wall thickening. Mild bibasilar scarring or atelectasis. No pleural effusion or pneumothorax. Musculoskeletal: No chest wall abnormality. No acute osseous findings. CT ABDOMEN PELVIS FINDINGS Hepatobiliary: No solid liver abnormality is seen. Hepatic steatosis. Tiny gallstones. No gallbladder wall thickening, or biliary dilatation. Pancreas: Unremarkable. No pancreatic ductal dilatation or surrounding inflammatory changes. Spleen: Normal in size without significant abnormality. Adrenals/Urinary Tract: Adrenal glands are unremarkable. Kidneys are normal, without renal calculi, solid lesion, or hydronephrosis. Bladder  is unremarkable. Stomach/Bowel: Stomach is within normal limits. Appendix not clearly visualized. No evidence of bowel wall thickening, distention, or inflammatory changes. Vascular/Lymphatic: Aortic atherosclerosis. No enlarged abdominal or pelvic lymph nodes. Reproductive: No mass or other abnormality. Other: No abdominal wall hernia or abnormality. No ascites. Musculoskeletal: No acute osseous findings. Left hip total arthroplasty. IMPRESSION: 1. No evidence of malignancy or metastatic disease in the chest, abdomen, or pelvis. 2. Diffuse bilateral bronchial wall thickening, consistent with nonspecific infectious or inflammatory bronchitis. 3. Hepatic steatosis. 4. Cholelithiasis. 5. Coronary artery disease. Aortic Atherosclerosis (ICD10-I70.0). Electronically Signed   By: Fredricka Jenny M.D.   On: 09/27/2023 13:00   MR BRAIN WO CONTRAST Result Date: 09/27/2023 CLINICAL DATA:  Neuro deficit, acute, stroke suspected EXAM: MRI HEAD WITHOUT CONTRAST TECHNIQUE: Multiplanar, multiecho pulse sequences of the brain and surrounding structures were obtained without intravenous contrast. COMPARISON:  CT head from earlier today.  MRI head April 4, 25. FINDINGS: Brain: Acute infarcts in the right MCA territory including in the right basal ganglia, right frontal, parietal and temporal lobes. Infarcts are most  confluent/extensive in the posterior right MCA territory including the parietal lobe. Edema without significant mass effect. No midline shift. No evidence of acute hemorrhage, mass lesion or hydrocephalus. Remote pontine and left cerebellar infarcts. Remote left parietal and bilateral frontal cortical infarcts. Remote bilateral corona radiata infarcts and chronic microvascular ischemic change. Vascular: Major arterial flow voids are maintained at the skull base. Skull and upper cervical spine: Normal marrow signal. Sinuses/Orbits: Bilateral maxillary sinus mucosal thickening and air-fluid levels. No acute orbital findings. IMPRESSION: 1. Acute, predominantly posterior right MCA territory infarcts. Edema without significant mass effect. 2. Many remote infarcts and chronic microvascular ischemic change. Electronically Signed   By: Stevenson Elbe M.D.   On: 09/27/2023 00:12   CT ANGIO HEAD NECK W WO CM Result Date: 09/26/2023 CLINICAL DATA:  Neuro deficit, acute, stroke suspected EXAM: CT ANGIOGRAPHY HEAD AND NECK WITH AND WITHOUT CONTRAST TECHNIQUE: Multidetector CT imaging of the head and neck was performed using the standard protocol during bolus administration of intravenous contrast. Multiplanar CT image reconstructions and MIPs were obtained to evaluate the vascular anatomy. Carotid stenosis measurements (when applicable) are obtained utilizing NASCET criteria, using the distal internal carotid diameter as the denominator. RADIATION DOSE REDUCTION: This exam was performed according to the departmental dose-optimization program which includes automated exposure control, adjustment of the mA and/or kV according to patient size and/or use of iterative reconstruction technique. CONTRAST:  75mL OMNIPAQUE  IOHEXOL  350 MG/ML SOLN COMPARISON:  Same day CT head. FINDINGS: CTA NECK FINDINGS Aortic arch: Aortic atherosclerosis. Great vessel origins are patent. Right carotid system: No evidence of dissection, stenosis  (50% or greater), or occlusion. Left carotid system: No evidence of dissection, stenosis (50% or greater), or occlusion. Vertebral arteries: Right dominant. No evidence of dissection, stenosis (50% or greater), or occlusion. Skeleton: No evidence of acute abnormality on limited assessment. Other neck: No evidence of acute abnormality on limited assessment. Upper chest: Visualized lung apices are clear. Review of the MIP images confirms the above findings CTA HEAD FINDINGS Anterior circulation: The intracranial ICAs are patent. Moderate right paraclinoid ICA stenosis. Occlusion of the proximal right M1 MCA with poor/irregular more distal M1 MCA reconstitution. Asymmetric opacification of the more distal right MCA vessels versus the left. The left MCA and bilateral ACAs are patent. Moderate distal left M1 MCA stenosis. Posterior circulation: Both vertebral arteries, basilar artery and bilateral posterior short are patent without proximal hemodynamically significant stenosis.  Venous sinuses: As permitted by contrast timing, patent. Review of the MIP images confirms the above findings IMPRESSION: 1. Occlusion of the proximal right M1 MCA with diminished more distal M1 MCA reconstitution. Asymmetrically poor opacification of the more distal right MCA vessels. 2. Moderate right paraclinoid ICA and distal left M1 MCA stenosis. Findings discussed with Dr. Del Favia via telephone at 11:43 p.m. Electronically Signed   By: Stevenson Elbe M.D.   On: 09/26/2023 23:44   CT Head Wo Contrast Result Date: 09/26/2023 CLINICAL DATA:  Delayed slowed speech. EXAM: CT HEAD WITHOUT CONTRAST TECHNIQUE: Contiguous axial images were obtained from the base of the skull through the vertex without intravenous contrast. RADIATION DOSE REDUCTION: This exam was performed according to the departmental dose-optimization program which includes automated exposure control, adjustment of the mA and/or kV according to patient size and/or use of iterative  reconstruction technique. COMPARISON:  Head CT and brain MRI 08/28/2023 FINDINGS: Brain: Wedge-shaped area of peripheral edema with loss of gray-white differentiation in the posterior right parieto-occipital region is consistent with acute to subacute infarct. No evidence for hemorrhagic transformation. Old right frontal infarct again noted. Lacunar infarcts are seen in the basal ganglia, as before. Patchy low attenuation in the deep hemispheric and periventricular white matter is nonspecific, but likely reflects chronic microvascular ischemic demyelination. Bilateral cerebellar infarcts are stable as is a chronic lacunar infarct in the right pons. No hydrocephalus or midline shift. No abnormal extra-axial fluid collection. Vascular: No hyperdense vessel or unexpected calcification. Skull: No evidence for fracture. No worrisome lytic or sclerotic lesion. Sinuses/Orbits: Trace fluid level noted left sphenoid sinus. The remaining visualized paranasal sinuses and mastoid air cells are clear. Visualized portions of the globes and intraorbital fat are unremarkable. Other: None. IMPRESSION: 1. Wedge-shaped area of peripheral edema with loss of gray-white differentiation in the posterior right parietooccipital region is consistent with acute to subacute infarct. No evidence for hemorrhagic transformation. No midline shift. 2. Stable appearance of old right frontal infarct, bilateral cerebellar infarcts, basal ganglia lacunar infarcts and chronic microvascular ischemic demyelination. Critical Value/emergent results were called by telephone at the time of interpretation on 09/26/2023 at 1:22 pm to provider Lind Repine , who verbally acknowledged these results. Electronically Signed   By: Donnal Fusi M.D.   On: 09/26/2023 13:22    EKG: 09/26/23 NSR 81 bpm  (personally reviewed)  TELEMETRY: SR 80- ST 130 bpm, no AF on tele during ER or inpatient admit (personally reviewed)  DEVICE HISTORY:  MDT ILR, implanted 06/10/2021  > pt is not monitored monthly due to cost  ILR interrogation 09/28/23 > personal review showsno AF for the lifetime of the device, <1% PVC's, presenting rhythm NSR at 94 bpm, only 6 episodes total > most recent pause episode on 04/23/23 lasting 7 seconds with vasovagal features to slowing (reviewed with Dr. Daneil Dunker) - two other pause episodes lasting 5 sec / 6 sec's., episode of SVT on 02/16/23 longest lasting 12 min 31 seconds with max rate of 240 bpm. No recent arrhythmias.     SVT / Episode #5   Pause / Episode #6      Assessment/Plan:  Cryptogenic Stroke  Vasovagal Pauses  SVT ETOH Abuse  No AF on EKG's in system, no documented AF on telemetry.  No recent arrhythmias to account for CVA. ETOH was positive on admit (167).  -continue tele monitoring  -pt states he was never "connected" to the device properly but there have been cost issues and he is not monitored monthly -  plan for outpatient visit for device interrogation at discharge  -pauses may be related to hx of drinking / blacking out > none since 03/2023, episodes reviewed showed vasovagal slowing. No role currently for PPM at this time.     For questions or updates, please contact [facility] Please consult [email or web address] for contact info under Cardiology/STEMI.  Signed, Creighton Doffing, NP-C, AGACNP-BC [facility] - Electrophysiology  09/28/2023, 11:30 AM  I have seen, examined the patient, and reviewed the above assessment and plan.     Assessment:  Mr. Chevon Unthank is a 68 year old male with past medical history notable for HTN, hepatitis C+, ETOH abuse, syncope, SVT, vagally mediated pauses who is status post loop recorder.  He is currently admitted with a cryptogenic stroke after presenting on 09/26/23 with slurred speech & facial droop. CT code stroke wedge-shaped area of edema in the right occipital region. MRI brain showed patchy subacute right posterior MCA infarct. Unfortunately, he never establish care  with his loop recorder and it has not been monitored.  EP was consulted to interrogate his loop recorder.  Loop recorder was interrogated at bedside and shows no episodes of AF, SVT, pauses since November 2024.  Cryptogenic Stroke  Vasovagal Pauses: Last episode November 2024. SVT: Last episode September 2024. ETOH Abuse   Plan:  - We will refer patient to our device clinic to establish routine remote monitoring of his device.  Ardeen Kohler, MD 09/28/2023 11:03 PM

## 2023-09-28 NOTE — Progress Notes (Signed)
Patient off the unit for CT.

## 2023-09-28 NOTE — Plan of Care (Signed)
  Problem: Education: Goal: Knowledge of disease or condition will improve Outcome: Progressing   Problem: Ischemic Stroke/TIA Tissue Perfusion: Goal: Complications of ischemic stroke/TIA will be minimized Outcome: Progressing   Problem: Coping: Goal: Will verbalize positive feelings about self Outcome: Progressing Goal: Will identify appropriate support needs Outcome: Progressing   Problem: Health Behavior/Discharge Planning: Goal: Goals will be collaboratively established with patient/family Outcome: Progressing   Problem: Self-Care: Goal: Ability to communicate needs accurately will improve Outcome: Progressing   Problem: Nutrition: Goal: Risk of aspiration will decrease Outcome: Progressing Goal: Dietary intake will improve Outcome: Progressing   Problem: Clinical Measurements: Goal: Respiratory complications will improve Outcome: Progressing Goal: Cardiovascular complication will be avoided Outcome: Progressing   Problem: Elimination: Goal: Will not experience complications related to urinary retention Outcome: Progressing   Problem: Skin Integrity: Goal: Risk for impaired skin integrity will decrease Outcome: Progressing

## 2023-09-28 NOTE — Progress Notes (Signed)
 Rounding Note    Patient Name: Niels Jaime Date of Encounter: 09/28/2023  [facility] Cardiologist: None   Subjective   Denies pain or SOB.    Inpatient Medications    Scheduled Meds:  amLODipine   2.5 mg Oral Daily   amoxicillin -clavulanate  1 tablet Oral Q12H   aspirin  EC  81 mg Oral Daily   clopidogrel   75 mg Oral Daily   enoxaparin  (LOVENOX ) injection  40 mg Subcutaneous Q24H   fluticasone  2 spray Each Nare Daily   fluticasone furoate-vilanterol  1 puff Inhalation Daily   Continuous Infusions:  PRN Meds: acetaminophen  **OR** acetaminophen , ipratropium-albuterol, ondansetron  **OR** ondansetron  (ZOFRAN ) IV   Vital Signs    Vitals:   09/27/23 2329 09/28/23 0400 09/28/23 0814 09/28/23 0835  BP: 139/82 (!) 146/90 (!) 134/93   Pulse: 79 91 (!) 115 (!) 115  Resp: 18 18 19 20   Temp: (!) 97.5 F (36.4 C) 98.9 F (37.2 C) 99.6 F (37.6 C)   TempSrc: Oral Oral Oral   SpO2: 93% 97% 98% 98%  Weight:      Height:       No intake or output data in the 24 hours ending 09/28/23 0932    09/26/2023   11:55 AM 08/28/2023    4:31 PM 06/24/2023    1:31 PM  Last 3 Weights  Weight (lbs) 220 lb 200 lb 197 lb  Weight (kg) 99.791 kg 90.719 kg 89.359 kg      Telemetry    NSR - Personally Reviewed  ECG    NA - Personally Reviewed  Physical Exam   GEN: No acute distress.   Neck: No JVD Cardiac: RRR, no murmurs, rubs, or gallops.  Respiratory: Clear to auscultation bilaterally. GI: Soft, nontender, non-distended  MS: No edema; No deformity. Neuro:  Nonfocal  Psych: Normal affect   Labs    High Sensitivity Troponin:  No results for input(s): "TROPONINIHS" in the last 720 hours.   Chemistry Recent Labs  Lab 09/26/23 1250 09/27/23 0708 09/28/23 0611  NA 138 138 133*  K 4.0 3.8 3.8  CL 109 104 100  CO2 21* 25 21*  GLUCOSE 89 97 95  BUN 11 6* 9  CREATININE 1.22 1.34* 1.49*  CALCIUM  9.0 9.1 9.2  MG  --   --  1.8  PROT  --  8.0  --   ALBUMIN  --   3.6  --   AST  --  17  --   ALT  --  11  --   ALKPHOS  --  71  --   BILITOT  --  0.9  --   GFRNONAA >60 58* 51*  ANIONGAP 8 9 12     Lipids  Recent Labs  Lab 09/27/23 0708  CHOL 164  TRIG 163*  HDL 27*  LDLCALC 104*  CHOLHDL 6.1    Hematology Recent Labs  Lab 09/26/23 1250 09/27/23 0708 09/28/23 0611  WBC 9.9 12.0* 13.0*  RBC 4.34 4.67 4.80  HGB 13.5 14.5 14.9  HCT 41.5 43.8 44.3  MCV 95.6 93.8 92.3  MCH 31.1 31.0 31.0  MCHC 32.5 33.1 33.6  RDW 13.0 12.8 12.8  PLT 179 175 173   Thyroid No results for input(s): "TSH", "FREET4" in the last 168 hours.  BNPNo results for input(s): "BNP", "PROBNP" in the last 168 hours.  DDimer No results for input(s): "DDIMER" in the last 168 hours.   Radiology    ECHOCARDIOGRAM COMPLETE BUBBLE STUDY Result Date: 09/27/2023  ECHOCARDIOGRAM REPORT   Patient Name:   JEIDEN HOESING Date of Exam: 09/27/2023 Medical Rec #:  [id number]       Height:       71.0 in Accession #:    [id number]      Weight:       220.0 lb Date of Birth:  December 31, 1955        BSA:          2.196 m Patient Age:    68 years        BP:           159/93 mmHg Patient Gender: M               HR:           71 bpm. Exam Location:  Inpatient Procedure: 2D Echo (Both Spectral and Color Flow Doppler were utilized during            procedure). Indications:    stroke  History:        Patient has prior history of Echocardiogram examinations, most                 recent 06/10/2021. Risk Factors:Current Smoker, Hypertension and                 Alcohol abuse.  Sonographer:    Dione Franks RDCS Referring Phys: [id number] DAVID MANUEL ORTIZ IMPRESSIONS  1. Left ventricular ejection fraction, by estimation, is 60 to 65%. The left ventricle has normal function. The left ventricle has no regional wall motion abnormalities. There is mild left ventricular hypertrophy. Left ventricular diastolic parameters are consistent with Grade I diastolic dysfunction (impaired relaxation).  2. Right ventricular  systolic function is normal. The right ventricular size is normal.  3. The mitral valve is normal in structure. No evidence of mitral valve regurgitation. No evidence of mitral stenosis.  4. The aortic valve is tricuspid. Aortic valve regurgitation is mild. No aortic stenosis is present.  5. Aortic dilatation noted. There is moderate dilatation of the aortic root, measuring 45 mm. There is mild dilatation of the ascending aorta, measuring 38 mm.  6. The inferior vena cava is normal in size with greater than 50% respiratory variability, suggesting right atrial pressure of 3 mmHg.  7. Agitated saline contrast bubble study was negative, with no evidence of any interatrial shunt. FINDINGS  Left Ventricle: Left ventricular ejection fraction, by estimation, is 60 to 65%. The left ventricle has normal function. The left ventricle has no regional wall motion abnormalities. The left ventricular internal cavity size was normal in size. There is  mild left ventricular hypertrophy. Left ventricular diastolic parameters are consistent with Grade I diastolic dysfunction (impaired relaxation). Normal left ventricular filling pressure. Right Ventricle: The right ventricular size is normal. Right vetricular wall thickness was not well visualized. Right ventricular systolic function is normal. Left Atrium: Left atrial size was normal in size. Right Atrium: Right atrial size was normal in size. Pericardium: There is no evidence of pericardial effusion. Mitral Valve: The mitral valve is normal in structure. No evidence of mitral valve regurgitation. No evidence of mitral valve stenosis. Tricuspid Valve: The tricuspid valve is normal in structure. Tricuspid valve regurgitation is not demonstrated. No evidence of tricuspid stenosis. Aortic Valve: The aortic valve is tricuspid. Aortic valve regurgitation is mild. Aortic regurgitation PHT measures 416 msec. No aortic stenosis is present. Aortic valve mean gradient measures 2.8 mmHg. Aortic  valve peak gradient measures 4.7 mmHg. Aortic  valve area,  by VTI measures 3.36 cm. Pulmonic Valve: The pulmonic valve was not well visualized. Pulmonic valve regurgitation is not visualized. No evidence of pulmonic stenosis. Aorta: Aortic dilatation noted. There is moderate dilatation of the aortic root, measuring 45 mm. There is mild dilatation of the ascending aorta, measuring 38 mm. Venous: The inferior vena cava is normal in size with greater than 50% respiratory variability, suggesting right atrial pressure of 3 mmHg. IAS/Shunts: No atrial level shunt detected by color flow Doppler. Agitated saline contrast was given intravenously to evaluate for intracardiac shunting. Agitated saline contrast bubble study was negative, with no evidence of any interatrial shunt.  LEFT VENTRICLE PLAX 2D LVIDd:         3.90 cm     Diastology LVIDs:         3.10 cm     LV e' medial:    4.13 cm/s LV PW:         1.20 cm     LV E/e' medial:  8.8 LV IVS:        1.20 cm     LV e' lateral:   7.07 cm/s LVOT diam:     2.40 cm     LV E/e' lateral: 5.2 LV SV:         78 LV SV Index:   36 LVOT Area:     4.52 cm  LV Volumes (MOD) LV vol d, MOD A2C: 90.6 ml LV vol s, MOD A2C: 49.5 ml LV SV MOD A2C:     41.1 ml RIGHT VENTRICLE             IVC RV Basal diam:  2.40 cm     IVC diam: 1.70 cm RV S prime:     10.20 cm/s TAPSE (M-mode): 1.7 cm LEFT ATRIUM             Index        RIGHT ATRIUM           Index LA diam:        3.80 cm 1.73 cm/m   RA Area:     12.60 cm LA Vol (A2C):   43.3 ml 19.72 ml/m  RA Volume:   26.10 ml  11.89 ml/m LA Vol (A4C):   46.1 ml 21.00 ml/m LA Biplane Vol: 47.0 ml 21.41 ml/m  AORTIC VALVE AV Area (Vmax):    3.58 cm AV Area (Vmean):   3.47 cm AV Area (VTI):     3.36 cm AV Vmax:           108.59 cm/s AV Vmean:          75.935 cm/s AV VTI:            0.233 m AV Peak Grad:      4.7 mmHg AV Mean Grad:      2.8 mmHg LVOT Vmax:         85.90 cm/s LVOT Vmean:        58.200 cm/s LVOT VTI:          0.173 m LVOT/AV VTI  ratio: 0.74 AI PHT:            416 msec  AORTA Ao Root diam: 4.50 cm Ao Asc diam:  3.80 cm MITRAL VALVE MV Area (PHT): 2.39 cm    SHUNTS MV Decel Time: 317 msec    Systemic VTI:  0.17 m MV E velocity: 36.50 cm/s  Systemic Diam: 2.40 cm MV A velocity: 70.40 cm/s MV E/A ratio:  0.52 Armida Lander MD Electronically signed by Armida Lander MD Signature Date/Time: 09/27/2023/5:32:26 PM    Final    CT CHEST ABDOMEN PELVIS W CONTRAST Result Date: 09/27/2023 CLINICAL DATA:  Malignancy screening * Tracking Code: BO * EXAM: CT CHEST, ABDOMEN, AND PELVIS WITH CONTRAST TECHNIQUE: Multidetector CT imaging of the chest, abdomen and pelvis was performed following the standard protocol during bolus administration of intravenous contrast. RADIATION DOSE REDUCTION: This exam was performed according to the departmental dose-optimization program which includes automated exposure control, adjustment of the mA and/or kV according to patient size and/or use of iterative reconstruction technique. CONTRAST:  75mL OMNIPAQUE  IOHEXOL  350 MG/ML SOLN COMPARISON:  09/09/2020 FINDINGS: CT CHEST FINDINGS Cardiovascular: Aortic atherosclerosis. Normal heart size. Left and right coronary artery calcifications. No pericardial effusion. Mediastinum/Nodes: No enlarged mediastinal, hilar, or axillary lymph nodes. Thyroid gland, trachea, and esophagus demonstrate no significant findings. Lungs/Pleura: Diffuse bilateral bronchial wall thickening. Mild bibasilar scarring or atelectasis. No pleural effusion or pneumothorax. Musculoskeletal: No chest wall abnormality. No acute osseous findings. CT ABDOMEN PELVIS FINDINGS Hepatobiliary: No solid liver abnormality is seen. Hepatic steatosis. Tiny gallstones. No gallbladder wall thickening, or biliary dilatation. Pancreas: Unremarkable. No pancreatic ductal dilatation or surrounding inflammatory changes. Spleen: Normal in size without significant abnormality. Adrenals/Urinary Tract: Adrenal glands are  unremarkable. Kidneys are normal, without renal calculi, solid lesion, or hydronephrosis. Bladder is unremarkable. Stomach/Bowel: Stomach is within normal limits. Appendix not clearly visualized. No evidence of bowel wall thickening, distention, or inflammatory changes. Vascular/Lymphatic: Aortic atherosclerosis. No enlarged abdominal or pelvic lymph nodes. Reproductive: No mass or other abnormality. Other: No abdominal wall hernia or abnormality. No ascites. Musculoskeletal: No acute osseous findings. Left hip total arthroplasty. IMPRESSION: 1. No evidence of malignancy or metastatic disease in the chest, abdomen, or pelvis. 2. Diffuse bilateral bronchial wall thickening, consistent with nonspecific infectious or inflammatory bronchitis. 3. Hepatic steatosis. 4. Cholelithiasis. 5. Coronary artery disease. Aortic Atherosclerosis (ICD10-I70.0). Electronically Signed   By: Fredricka Jenny M.D.   On: 09/27/2023 13:00   MR BRAIN WO CONTRAST Result Date: 09/27/2023 CLINICAL DATA:  Neuro deficit, acute, stroke suspected EXAM: MRI HEAD WITHOUT CONTRAST TECHNIQUE: Multiplanar, multiecho pulse sequences of the brain and surrounding structures were obtained without intravenous contrast. COMPARISON:  CT head from earlier today.  MRI head April 4, 25. FINDINGS: Brain: Acute infarcts in the right MCA territory including in the right basal ganglia, right frontal, parietal and temporal lobes. Infarcts are most confluent/extensive in the posterior right MCA territory including the parietal lobe. Edema without significant mass effect. No midline shift. No evidence of acute hemorrhage, mass lesion or hydrocephalus. Remote pontine and left cerebellar infarcts. Remote left parietal and bilateral frontal cortical infarcts. Remote bilateral corona radiata infarcts and chronic microvascular ischemic change. Vascular: Major arterial flow voids are maintained at the skull base. Skull and upper cervical spine: Normal marrow signal.  Sinuses/Orbits: Bilateral maxillary sinus mucosal thickening and air-fluid levels. No acute orbital findings. IMPRESSION: 1. Acute, predominantly posterior right MCA territory infarcts. Edema without significant mass effect. 2. Many remote infarcts and chronic microvascular ischemic change. Electronically Signed   By: Stevenson Elbe M.D.   On: 09/27/2023 00:12   CT ANGIO HEAD NECK W WO CM Result Date: 09/26/2023 CLINICAL DATA:  Neuro deficit, acute, stroke suspected EXAM: CT ANGIOGRAPHY HEAD AND NECK WITH AND WITHOUT CONTRAST TECHNIQUE: Multidetector CT imaging of the head and neck was performed using the standard protocol during bolus administration of intravenous contrast. Multiplanar CT image reconstructions and MIPs were obtained  to evaluate the vascular anatomy. Carotid stenosis measurements (when applicable) are obtained utilizing NASCET criteria, using the distal internal carotid diameter as the denominator. RADIATION DOSE REDUCTION: This exam was performed according to the departmental dose-optimization program which includes automated exposure control, adjustment of the mA and/or kV according to patient size and/or use of iterative reconstruction technique. CONTRAST:  75mL OMNIPAQUE  IOHEXOL  350 MG/ML SOLN COMPARISON:  Same day CT head. FINDINGS: CTA NECK FINDINGS Aortic arch: Aortic atherosclerosis. Great vessel origins are patent. Right carotid system: No evidence of dissection, stenosis (50% or greater), or occlusion. Left carotid system: No evidence of dissection, stenosis (50% or greater), or occlusion. Vertebral arteries: Right dominant. No evidence of dissection, stenosis (50% or greater), or occlusion. Skeleton: No evidence of acute abnormality on limited assessment. Other neck: No evidence of acute abnormality on limited assessment. Upper chest: Visualized lung apices are clear. Review of the MIP images confirms the above findings CTA HEAD FINDINGS Anterior circulation: The intracranial ICAs  are patent. Moderate right paraclinoid ICA stenosis. Occlusion of the proximal right M1 MCA with poor/irregular more distal M1 MCA reconstitution. Asymmetric opacification of the more distal right MCA vessels versus the left. The left MCA and bilateral ACAs are patent. Moderate distal left M1 MCA stenosis. Posterior circulation: Both vertebral arteries, basilar artery and bilateral posterior short are patent without proximal hemodynamically significant stenosis. Venous sinuses: As permitted by contrast timing, patent. Review of the MIP images confirms the above findings IMPRESSION: 1. Occlusion of the proximal right M1 MCA with diminished more distal M1 MCA reconstitution. Asymmetrically poor opacification of the more distal right MCA vessels. 2. Moderate right paraclinoid ICA and distal left M1 MCA stenosis. Findings discussed with Dr. Del Favia via telephone at 11:43 p.m. Electronically Signed   By: Stevenson Elbe M.D.   On: 09/26/2023 23:44   CT Head Wo Contrast Result Date: 09/26/2023 CLINICAL DATA:  Delayed slowed speech. EXAM: CT HEAD WITHOUT CONTRAST TECHNIQUE: Contiguous axial images were obtained from the base of the skull through the vertex without intravenous contrast. RADIATION DOSE REDUCTION: This exam was performed according to the departmental dose-optimization program which includes automated exposure control, adjustment of the mA and/or kV according to patient size and/or use of iterative reconstruction technique. COMPARISON:  Head CT and brain MRI 08/28/2023 FINDINGS: Brain: Wedge-shaped area of peripheral edema with loss of gray-white differentiation in the posterior right parieto-occipital region is consistent with acute to subacute infarct. No evidence for hemorrhagic transformation. Old right frontal infarct again noted. Lacunar infarcts are seen in the basal ganglia, as before. Patchy low attenuation in the deep hemispheric and periventricular white matter is nonspecific, but likely reflects  chronic microvascular ischemic demyelination. Bilateral cerebellar infarcts are stable as is a chronic lacunar infarct in the right pons. No hydrocephalus or midline shift. No abnormal extra-axial fluid collection. Vascular: No hyperdense vessel or unexpected calcification. Skull: No evidence for fracture. No worrisome lytic or sclerotic lesion. Sinuses/Orbits: Trace fluid level noted left sphenoid sinus. The remaining visualized paranasal sinuses and mastoid air cells are clear. Visualized portions of the globes and intraorbital fat are unremarkable. Other: None. IMPRESSION: 1. Wedge-shaped area of peripheral edema with loss of gray-white differentiation in the posterior right parietooccipital region is consistent with acute to subacute infarct. No evidence for hemorrhagic transformation. No midline shift. 2. Stable appearance of old right frontal infarct, bilateral cerebellar infarcts, basal ganglia lacunar infarcts and chronic microvascular ischemic demyelination. Critical Value/emergent results were called by telephone at the time of interpretation on 09/26/2023  at 1:22 pm to provider Lind Repine , who verbally acknowledged these results. Electronically Signed   By: Donnal Fusi M.D.   On: 09/26/2023 13:22    Cardiac Studies   Echo see above.   Patient Profile     68 y.o. male was admitted with slow and slurred speech. He had facial droop. MRI demonstrated patchy subacute right MCA infarct.   We are called because of recurrent episodes of AMS and bradycardia on ILR .   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    We can follow this as an out patient with CTs in the future.    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  He has not had any atrial fibrillation documented.  He has had some SVT but is regular.  He needs to be set up for routine ILR interrogations.     Hypertension: Blood pressure is elevated. I started low dose Norvasc  yesterday  and we can follow and titrate.    Syncope: See yesterday's consult note.  I have contacted EP to see.      For questions or updates, please contact [facility] Please consult [email or web address] for contact info under        Signed, Eilleen Grates, MD  09/28/2023, 9:32 AM

## 2023-09-29 DIAGNOSIS — I639 Cerebral infarction, unspecified: Secondary | ICD-10-CM | POA: Diagnosis not present

## 2023-09-29 LAB — CBC
HCT: 40.2 % (ref 39.0–52.0)
Hemoglobin: 13.8 g/dL (ref 13.0–17.0)
MCH: 31.4 pg (ref 26.0–34.0)
MCHC: 34.3 g/dL (ref 30.0–36.0)
MCV: 91.4 fL (ref 80.0–100.0)
Platelets: 156 10*3/uL (ref 150–400)
RBC: 4.4 MIL/uL (ref 4.22–5.81)
RDW: 12.7 % (ref 11.5–15.5)
WBC: 11.3 10*3/uL — ABNORMAL HIGH (ref 4.0–10.5)
nRBC: 0 % (ref 0.0–0.2)

## 2023-09-29 LAB — BASIC METABOLIC PANEL WITH GFR
Anion gap: 12 (ref 5–15)
BUN: 10 mg/dL (ref 8–23)
CO2: 19 mmol/L — ABNORMAL LOW (ref 22–32)
Calcium: 8.8 mg/dL — ABNORMAL LOW (ref 8.9–10.3)
Chloride: 108 mmol/L (ref 98–111)
Creatinine, Ser: 1.28 mg/dL — ABNORMAL HIGH (ref 0.61–1.24)
GFR, Estimated: >60 mL/min (ref >60)
Glucose, Bld: 101 mg/dL — ABNORMAL HIGH (ref 70–99)
Potassium: 3.6 mmol/L (ref 3.5–5.1)
Sodium: 139 mmol/L (ref 135–145)

## 2023-09-29 LAB — MAGNESIUM: Magnesium: 1.7 mg/dL (ref 1.7–2.4)

## 2023-09-29 LAB — GLUCOSE, CAPILLARY
Glucose-Capillary: 128 mg/dL — ABNORMAL HIGH (ref 70–99)
Glucose-Capillary: 104 mg/dL — ABNORMAL HIGH (ref 70–99)
Glucose-Capillary: 108 mg/dL — ABNORMAL HIGH (ref 70–99)
Glucose-Capillary: 104 mg/dL — ABNORMAL HIGH (ref 70–99)

## 2023-09-29 MED ORDER — CHLORDIAZEPOXIDE HCL 25 MG PO CAPS: 25.0000 mg | ORAL_CAPSULE | Freq: Every day | ORAL | Status: DC

## 2023-09-29 MED ORDER — QUETIAPINE FUMARATE 25 MG PO TABS: 25.0000 mg | ORAL_TABLET | Freq: Every day | ORAL | Status: DC

## 2023-09-29 MED ORDER — HYDROXYZINE HCL 25 MG PO TABS: 25.0000 mg | ORAL_TABLET | Freq: Four times a day (QID) | ORAL | Status: DC | PRN

## 2023-09-29 MED ORDER — LOPERAMIDE HCL 2 MG PO CAPS: 2.0000 mg | ORAL_CAPSULE | ORAL | Status: DC | PRN

## 2023-09-29 MED ORDER — CHLORDIAZEPOXIDE HCL 25 MG PO CAPS
25.0000 mg | ORAL_CAPSULE | Freq: Four times a day (QID) | ORAL | Status: AC
Start: 2023-09-29 — End: 2023-10-01
  Administered 2023-09-29 – 2023-09-30 (×6): 25 mg via ORAL
  Filled 2023-09-29 (×6): qty 1

## 2023-09-29 MED ORDER — CHLORDIAZEPOXIDE HCL 25 MG PO CAPS
25.0000 mg | ORAL_CAPSULE | Freq: Once | ORAL | Status: AC
  Administered 2023-09-29: 25 mg via ORAL
  Filled 2023-09-29: qty 1

## 2023-09-29 MED ORDER — THIAMINE HCL 100 MG/ML IJ SOLN
100.0000 mg | Freq: Once | INTRAMUSCULAR | Status: AC
  Administered 2023-09-29: 100 mg via INTRAMUSCULAR
  Filled 2023-09-29: qty 2

## 2023-09-29 MED ORDER — ADULT MULTIVITAMIN W/MINERALS CH
1.0000 | ORAL_TABLET | Freq: Every day | ORAL | Status: DC
  Administered 2023-09-29 – 2023-10-01 (×3): 1 via ORAL
  Filled 2023-09-29 (×3): qty 1

## 2023-09-29 MED ORDER — CHLORDIAZEPOXIDE HCL 25 MG PO CAPS: 25.0000 mg | ORAL_CAPSULE | ORAL | Status: DC

## 2023-09-29 MED ORDER — CHLORDIAZEPOXIDE HCL 25 MG PO CAPS: 25.0000 mg | ORAL_CAPSULE | Freq: Four times a day (QID) | ORAL | Status: DC | PRN

## 2023-09-29 MED ORDER — CHLORDIAZEPOXIDE HCL 25 MG PO CAPS
25.0000 mg | ORAL_CAPSULE | Freq: Three times a day (TID) | ORAL | Status: DC
  Administered 2023-10-01: 25 mg via ORAL
  Filled 2023-09-29: qty 1

## 2023-09-29 NOTE — Progress Notes (Signed)
 Rounding Note    Patient Name: Ryan Montgomery Date of Encounter: 09/29/2023  [facility] Cardiologist: None   Subjective   He is very alert.  Does not seem to have any confusion.  Denies pain or SOB.   Inpatient Medications    Scheduled Meds:  amLODipine   2.5 mg Oral Daily   amoxicillin -clavulanate  1 tablet Oral Q12H   aspirin  EC  81 mg Oral Daily   clopidogrel   75 mg Oral Daily   enoxaparin  (LOVENOX ) injection  40 mg Subcutaneous Q24H   fluticasone  2 spray Each Nare Daily   fluticasone furoate-vilanterol  1 puff Inhalation Daily   Continuous Infusions:  PRN Meds: acetaminophen  **OR** acetaminophen , ipratropium-albuterol, ondansetron  **OR** ondansetron  (ZOFRAN ) IV   Vital Signs    Vitals:   09/28/23 2000 09/28/23 2330 09/29/23 0359 09/29/23 0723  BP: 126/70 (!) 146/87 (!) 138/97 139/81  Pulse: 86 78 94 79  Resp: 18 18 18 18   Temp: 98 F (36.7 C) 98.5 F (36.9 C) 98.5 F (36.9 C) 98.9 F (37.2 C)  TempSrc:  Oral  Oral  SpO2: 97% 95% 99% 100%  Weight:      Height:        Intake/Output Summary (Last 24 hours) at 09/29/2023 0934 Last data filed at 09/29/2023 0428 Gross per 24 hour  Intake 571.55 ml  Output 1750 ml  Net -1178.45 ml      09/26/2023   11:55 AM 08/28/2023    4:31 PM 06/24/2023    1:31 PM  Last 3 Weights  Weight (lbs) 220 lb 200 lb 197 lb  Weight (kg) 99.791 kg 90.719 kg 89.359 kg      Telemetry    NSR - Personally Reviewed  ECG    NA - Personally Reviewed  Physical Exam   GEN: No  acute distress.   Neck: No  JVD Cardiac: RRR, no murmurs, rubs, or gallops.  Respiratory: Clear   to auscultation bilaterally. GI: Soft, nontender, non-distended, normal bowel sounds  MS:  No edema; No deformity. Neuro:   Nonfocal  Psych: Oriented and appropriate   Labs    High Sensitivity Troponin:  No results for input(s): "TROPONINIHS" in the last 720 hours.   Chemistry Recent Labs  Lab 09/27/23 0708 09/28/23 0611 09/29/23 0549  NA  138 133* 139  K 3.8 3.8 3.6  CL 104 100 108  CO2 25 21* 19*  GLUCOSE 97 95 101*  BUN 6* 9 10  CREATININE 1.34* 1.49* 1.28*  CALCIUM  9.1 9.2 8.8*  MG  --  1.8 1.7  PROT 8.0  --   --   ALBUMIN 3.6  --   --   AST 17  --   --   ALT 11  --   --   ALKPHOS 71  --   --   BILITOT 0.9  --   --   GFRNONAA 58* 51* >60  ANIONGAP 9 12 12     Lipids  Recent Labs  Lab 09/27/23 0708  CHOL 164  TRIG 163*  HDL 27*  LDLCALC 104*  CHOLHDL 6.1    Hematology Recent Labs  Lab 09/27/23 0708 09/28/23 0611 09/29/23 0549  WBC 12.0* 13.0* 11.3*  RBC 4.67 4.80 4.40  HGB 14.5 14.9 13.8  HCT 43.8 44.3 40.2  MCV 93.8 92.3 91.4  MCH 31.0 31.0 31.4  MCHC 33.1 33.6 34.3  RDW 12.8 12.8 12.7  PLT 175 173 156   Thyroid No results for input(s): "TSH", "FREET4" in  the last 168 hours.  BNPNo results for input(s): "BNP", "PROBNP" in the last 168 hours.  DDimer No results for input(s): "DDIMER" in the last 168 hours.   Radiology    CT HEAD WO CONTRAST ( ) Result Date: 09/28/2023 CLINICAL DATA:  Mental status change, unknown cause. EXAM: CT HEAD WITHOUT CONTRAST TECHNIQUE: Contiguous axial images were obtained from the base of the skull through the vertex without intravenous contrast. RADIATION DOSE REDUCTION: This exam was performed according to the departmental dose-optimization program which includes automated exposure control, adjustment of the mA and/or kV according to patient size and/or use of iterative reconstruction technique. COMPARISON:  MRI head May 3, 25. FINDINGS: Brain: When comparing across modalities, no substantial change in acute of all vein right MCA territory infarcts. Similar edema without significant mass effect. Additional patchy white matter hypodensities most likely represent chronic microvascular ischemic disease. Multiple remote infarcts including within the pons, cerebellum, left parietal lobe and bilateral frontal lobes. Vascular: No hyperdense vessel.  Calcific atherosclerosis.  Skull: No acute fracture. Sinuses/Orbits: Paranasal sinus mucosal thickening with air-fluid levels in the maxillary sinuses. No acute orbital findings. IMPRESSION: Redemonstrated evolving acute right MCA territory infarct. No progressive mass effect or interval hemorrhage. Electronically Signed   By: Stevenson Elbe M.D.   On: 09/28/2023 19:48   ECHOCARDIOGRAM COMPLETE BUBBLE STUDY Result Date: 09/27/2023    ECHOCARDIOGRAM REPORT   Patient Name:   Ryan Montgomery Date of Exam: 09/27/2023 Medical Rec #:  [id number]       Height:       71.0 in Accession #:    [id number]      Weight:       220.0 lb Date of Birth:  05/25/1956        BSA:          2.196 m Patient Age:    68 years        BP:           159/93 mmHg Patient Gender: M               HR:           71 bpm. Exam Location:  Inpatient Procedure: 2D Echo (Both Spectral and Color Flow Doppler were utilized during            procedure). Indications:    stroke  History:        Patient has prior history of Echocardiogram examinations, most                 recent 06/10/2021. Risk Factors:Current Smoker, Hypertension and                 Alcohol abuse.  Sonographer:    Dione Franks RDCS Referring Phys: [id number] DAVID MANUEL ORTIZ IMPRESSIONS  1. Left ventricular ejection fraction, by estimation, is 60 to 65%. The left ventricle has normal function. The left ventricle has no regional wall motion abnormalities. There is mild left ventricular hypertrophy. Left ventricular diastolic parameters are consistent with Grade I diastolic dysfunction (impaired relaxation).  2. Right ventricular systolic function is normal. The right ventricular size is normal.  3. The mitral valve is normal in structure. No evidence of mitral valve regurgitation. No evidence of mitral stenosis.  4. The aortic valve is tricuspid. Aortic valve regurgitation is mild. No aortic stenosis is present.  5. Aortic dilatation noted. There is moderate dilatation of the aortic root, measuring 45 mm. There is  mild dilatation of the ascending aorta, measuring 38 mm.  6. The inferior vena cava is normal in size with greater than 50% respiratory variability, suggesting right atrial pressure of 3 mmHg.  7. Agitated saline contrast bubble study was negative, with no evidence of any interatrial shunt. FINDINGS  Left Ventricle: Left ventricular ejection fraction, by estimation, is 60 to 65%. The left ventricle has normal function. The left ventricle has no regional wall motion abnormalities. The left ventricular internal cavity size was normal in size. There is  mild left ventricular hypertrophy. Left ventricular diastolic parameters are consistent with Grade I diastolic dysfunction (impaired relaxation). Normal left ventricular filling pressure. Right Ventricle: The right ventricular size is normal. Right vetricular wall thickness was not well visualized. Right ventricular systolic function is normal. Left Atrium: Left atrial size was normal in size. Right Atrium: Right atrial size was normal in size. Pericardium: There is no evidence of pericardial effusion. Mitral Valve: The mitral valve is normal in structure. No evidence of mitral valve regurgitation. No evidence of mitral valve stenosis. Tricuspid Valve: The tricuspid valve is normal in structure. Tricuspid valve regurgitation is not demonstrated. No evidence of tricuspid stenosis. Aortic Valve: The aortic valve is tricuspid. Aortic valve regurgitation is mild. Aortic regurgitation PHT measures 416 msec. No aortic stenosis is present. Aortic valve mean gradient measures 2.8 mmHg. Aortic valve peak gradient measures 4.7 mmHg. Aortic  valve area, by VTI measures 3.36 cm. Pulmonic Valve: The pulmonic valve was not well visualized. Pulmonic valve regurgitation is not visualized. No evidence of pulmonic stenosis. Aorta: Aortic dilatation noted. There is moderate dilatation of the aortic root, measuring 45 mm. There is mild dilatation of the ascending aorta, measuring 38 mm.  Venous: The inferior vena cava is normal in size with greater than 50% respiratory variability, suggesting right atrial pressure of 3 mmHg. IAS/Shunts: No atrial level shunt detected by color flow Doppler. Agitated saline contrast was given intravenously to evaluate for intracardiac shunting. Agitated saline contrast bubble study was negative, with no evidence of any interatrial shunt.  LEFT VENTRICLE PLAX 2D LVIDd:         3.90 cm     Diastology LVIDs:         3.10 cm     LV e' medial:    4.13 cm/s LV PW:         1.20 cm     LV E/e' medial:  8.8 LV IVS:        1.20 cm     LV e' lateral:   7.07 cm/s LVOT diam:     2.40 cm     LV E/e' lateral: 5.2 LV SV:         78 LV SV Index:   36 LVOT Area:     4.52 cm  LV Volumes (MOD) LV vol d, MOD A2C: 90.6 ml LV vol s, MOD A2C: 49.5 ml LV SV MOD A2C:     41.1 ml RIGHT VENTRICLE             IVC RV Basal diam:  2.40 cm     IVC diam: 1.70 cm RV S prime:     10.20 cm/s TAPSE (M-mode): 1.7 cm LEFT ATRIUM             Index        RIGHT ATRIUM           Index LA diam:        3.80 cm 1.73 cm/m   RA Area:     12.60 cm LA Vol (A2C):  43.3 ml 19.72 ml/m  RA Volume:   26.10 ml  11.89 ml/m LA Vol (A4C):   46.1 ml 21.00 ml/m LA Biplane Vol: 47.0 ml 21.41 ml/m  AORTIC VALVE AV Area (Vmax):    3.58 cm AV Area (Vmean):   3.47 cm AV Area (VTI):     3.36 cm AV Vmax:           108.59 cm/s AV Vmean:          75.935 cm/s AV VTI:            0.233 m AV Peak Grad:      4.7 mmHg AV Mean Grad:      2.8 mmHg LVOT Vmax:         85.90 cm/s LVOT Vmean:        58.200 cm/s LVOT VTI:          0.173 m LVOT/AV VTI ratio: 0.74 AI PHT:            416 msec  AORTA Ao Root diam: 4.50 cm Ao Asc diam:  3.80 cm MITRAL VALVE MV Area (PHT): 2.39 cm    SHUNTS MV Decel Time: 317 msec    Systemic VTI:  0.17 m MV E velocity: 36.50 cm/s  Systemic Diam: 2.40 cm MV A velocity: 70.40 cm/s MV E/A ratio:  0.52 Armida Lander MD Electronically signed by Armida Lander MD Signature Date/Time: 09/27/2023/5:32:26 PM     Final    CT CHEST ABDOMEN PELVIS W CONTRAST Result Date: 09/27/2023 CLINICAL DATA:  Malignancy screening * Tracking Code: BO * EXAM: CT CHEST, ABDOMEN, AND PELVIS WITH CONTRAST TECHNIQUE: Multidetector CT imaging of the chest, abdomen and pelvis was performed following the standard protocol during bolus administration of intravenous contrast. RADIATION DOSE REDUCTION: This exam was performed according to the departmental dose-optimization program which includes automated exposure control, adjustment of the mA and/or kV according to patient size and/or use of iterative reconstruction technique. CONTRAST:  75mL OMNIPAQUE  IOHEXOL  350 MG/ML SOLN COMPARISON:  09/09/2020 FINDINGS: CT CHEST FINDINGS Cardiovascular: Aortic atherosclerosis. Normal heart size. Left and right coronary artery calcifications. No pericardial effusion. Mediastinum/Nodes: No enlarged mediastinal, hilar, or axillary lymph nodes. Thyroid gland, trachea, and esophagus demonstrate no significant findings. Lungs/Pleura: Diffuse bilateral bronchial wall thickening. Mild bibasilar scarring or atelectasis. No pleural effusion or pneumothorax. Musculoskeletal: No chest wall abnormality. No acute osseous findings. CT ABDOMEN PELVIS FINDINGS Hepatobiliary: No solid liver abnormality is seen. Hepatic steatosis. Tiny gallstones. No gallbladder wall thickening, or biliary dilatation. Pancreas: Unremarkable. No pancreatic ductal dilatation or surrounding inflammatory changes. Spleen: Normal in size without significant abnormality. Adrenals/Urinary Tract: Adrenal glands are unremarkable. Kidneys are normal, without renal calculi, solid lesion, or hydronephrosis. Bladder is unremarkable. Stomach/Bowel: Stomach is within normal limits. Appendix not clearly visualized. No evidence of bowel wall thickening, distention, or inflammatory changes. Vascular/Lymphatic: Aortic atherosclerosis. No enlarged abdominal or pelvic lymph nodes. Reproductive: No mass or other  abnormality. Other: No abdominal wall hernia or abnormality. No ascites. Musculoskeletal: No acute osseous findings. Left hip total arthroplasty. IMPRESSION: 1. No evidence of malignancy or metastatic disease in the chest, abdomen, or pelvis. 2. Diffuse bilateral bronchial wall thickening, consistent with nonspecific infectious or inflammatory bronchitis. 3. Hepatic steatosis. 4. Cholelithiasis. 5. Coronary artery disease. Aortic Atherosclerosis (ICD10-I70.0). Electronically Signed   By: Fredricka Jenny M.D.   On: 09/27/2023 13:00    Cardiac Studies   Echo see above.   Patient Profile     68 y.o. male was admitted with slow  and slurred speech. He had facial droop. MRI demonstrated patchy subacute right MCA infarct.   We are called because of recurrent episodes of AMS and bradycardia on ILR .   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    We can follow up in one year with a CT.  We will arrange follow up for gen cards (likely me)   Cryptogenic stroke:   We will arrange for routine in office device interrogations.     Hypertension: Blood pressure is OK.  Continue Norvasc .    Syncope: Reviewed with EP.  Bradycardia months ago was likely vagal.  No recent syncope.  AMS has clearly been associated with ETOH which he reports he is no longer doing.  No indication for pacing and we will follow his device.         For questions or updates, please contact [facility] Please consult [email or web address] for contact info under        Signed, Eilleen Grates, MD  09/29/2023, 9:34 AM

## 2023-09-29 NOTE — Plan of Care (Signed)
 Comfortably resting, cooperative when RN doing bedside care, no significant changes happen as of this time. Problem: Education: Goal: Knowledge of disease or condition will improve Outcome: Progressing   Problem: Ischemic Stroke/TIA Tissue Perfusion: Goal: Complications of ischemic stroke/TIA will be minimized Outcome: Progressing   Problem: Coping: Goal: Will verbalize positive feelings about self Outcome: Progressing   Problem: Health Behavior/Discharge Planning: Goal: Ability to manage health-related needs will improve Outcome: Progressing   Problem: Self-Care: Goal: Ability to participate in self-care as condition permits will improve Outcome: Progressing   Problem: Nutrition: Goal: Risk of aspiration will decrease Outcome: Progressing   Problem: Nutrition: Goal: Dietary intake will improve Outcome: Progressing   Problem: Clinical Measurements: Goal: Will remain free from infection Outcome: Progressing   Problem: Activity: Goal: Risk for activity intolerance will decrease Outcome: Progressing   Problem: Safety: Goal: Ability to remain free from injury will improve Outcome: Progressing   Problem: Skin Integrity: Goal: Risk for impaired skin integrity will decrease Outcome: Progressing

## 2023-09-29 NOTE — Progress Notes (Signed)
 Physical Therapy Treatment Patient Details Name: Ryan Montgomery MRN: [id number] DOB: 1955/12/19 Today's Date: 09/29/2023   History of Present Illness Pt is 68 yo presenting to [facility] ED initially on 5/3; transferred to [facility] due to family concerns for speech being slow and slurred. CT at [facility] ER with findings of wedge shaped subacute infarct in R hemisphere per neurologist note. PMH: CVA with loop recorder in place, Hep C and tobacco abuse.    PT Comments  Pt tolerates treatment well but continues to demonstrate deficits in awareness and balance which place him at a high risk for falls. Pt with an instance of knee buckling during ambulation with SPC, pt reports this happens intermittently at baseline from a prior knee injury. Pt continues to demonstrate L inattention and is unable to successfully don shoes when sitting at the edge of the bed. Pt appears to have poor awareness of his new deficits s/p CVA. PT updates recommendations to short term inpatient PT placement due to lack of consistent caregiver support and deficits in balance and awareness.    If plan is discharge home, recommend the following: A little help with walking and/or transfers;A lot of help with bathing/dressing/bathroom;Assistance with cooking/housework;Assist for transportation;Help with stairs or ramp for entrance   Can travel by private vehicle     Yes  Equipment Recommendations  Rolling walker (2 wheels)    Recommendations for Other Services       Precautions / Restrictions Precautions Precautions: Fall Recall of Precautions/Restrictions: Impaired Restrictions Weight Bearing Restrictions Per Provider Order: No     Mobility  Bed Mobility Overal bed mobility: Needs Assistance Bed Mobility: Supine to Sit, Sit to Supine     Supine to sit: Supervision Sit to supine: Supervision        Transfers Overall transfer level: Needs assistance Equipment used: None Transfers: Sit to/from Stand Sit to Stand: Contact guard  assist                Ambulation/Gait Ambulation/Gait assistance: Min assist Gait Distance (Feet): 120 Feet Assistive device: Straight cane Gait Pattern/deviations: Step-through pattern, Knees buckling Gait velocity: reduced Gait velocity interpretation: <1.8 ft/sec, indicate of risk for recurrent falls   General Gait Details: pt with an instance of L knee buckling during stance phase, increased lateral drift   Stairs             Wheelchair Mobility     Tilt Bed    Modified Rankin (Stroke Patients Only) Modified Rankin (Stroke Patients Only) Pre-Morbid Rankin Score: Slight disability Modified Rankin: Moderately severe disability     Balance Overall balance assessment: Needs assistance Sitting-balance support: No upper extremity supported, Feet supported Sitting balance-Leahy Scale: Good     Standing balance support: Single extremity supported, Reliant on assistive device for balance Standing balance-Leahy Scale: Poor                              Communication Communication Communication: Impaired Factors Affecting Communication: Hearing impaired  Cognition Arousal: Alert Behavior During Therapy: Impulsive   PT - Cognitive impairments: Awareness, Safety/Judgement                         Following commands: Intact      Cueing Cueing Techniques: Verbal cues  Exercises      General Comments General comments (skin integrity, edema, etc.): VSS on RA, pt continues to demonstrate mild L inattention  Pertinent Vitals/Pain Pain Assessment Pain Assessment: No/denies pain    Home Living                          Prior Function            PT Goals (current goals can now be found in the care plan section) Acute Rehab PT Goals Patient Stated Goal: to return to prior level of function Progress towards PT goals: Progressing toward goals    Frequency    Min 3X/week      PT Plan      Co-evaluation               AM-PAC PT "6 Clicks" Mobility   Outcome Measure  Help needed turning from your back to your side while in a flat bed without using bedrails?: A Little Help needed moving from lying on your back to sitting on the side of a flat bed without using bedrails?: A Little Help needed moving to and from a bed to a chair (including a wheelchair)?: A Little Help needed standing up from a chair using your arms (e.g., wheelchair or bedside chair)?: A Little Help needed to walk in hospital room?: A Little Help needed climbing 3-5 steps with a railing? : Total 6 Click Score: 16    End of Session Equipment Utilized During Treatment: Gait belt Activity Tolerance: Patient tolerated treatment well Patient left: in bed;with call bell/phone within reach;with bed alarm set Nurse Communication: Mobility status PT Visit Diagnosis: Unsteadiness on feet (R26.81);Other abnormalities of gait and mobility (R26.89)     Time: 1710-1719 PT Time Calculation (min) (ACUTE ONLY): 9 min  Charges:    $Gait Training: 8-22 mins PT General Charges $$ ACUTE PT VISIT: 1 Visit                     Rexie Catena, PT, DPT Acute Rehabilitation Office [phone]    Rexie Catena 09/29/2023, 5:29 PM

## 2023-09-29 NOTE — Evaluation (Signed)
 Occupational Therapy Evaluation Patient Details Name: Ryan Montgomery MRN: [id number] DOB: Jun 28, 1955 Today's Date: 09/29/2023   History of Present Illness   Pt is 69 yo presenting to [facility] ED initially on 5/3; transferred to [facility] due to family concerns for speech being slow and slurred. CT at [facility] ER with findings of wedge shaped subacute infarct in R hemisphere per neurologist note. PMH: CVA with loop recorder in place, Hep C and tobacco abuse.     Clinical Impressions Pt typically gets assist from PCA 3 days/wk for 4 hours- they help with meal prep and take him to MD appointments etc. He reports that he does his own medicine and financial management. Today He is very pleasant, cooperative - but demonstrates deficits in cognition (awareness, problem solving, attention.Aaron Aas) L inattention during functional tasks, vision, and generalized strength/activity tolerance. He has decreased insight into deficits and will benefit from skilled OT in the acute setting as well as afterwards at the post-acute rehab of <3 hour daily to maximize safety and independence in ADL and functional transfers/use of DME.     If plan is discharge home, recommend the following:   A little help with walking and/or transfers;A lot of help with bathing/dressing/bathroom;Assistance with cooking/housework;Direct supervision/assist for medications management;Direct supervision/assist for financial management;Assist for transportation;Help with stairs or ramp for entrance;Supervision due to cognitive status     Functional Status Assessment   Patient has had a recent decline in their functional status and demonstrates the ability to make significant improvements in function in a reasonable and predictable amount of time.     Equipment Recommendations   Tub/shower seat     Recommendations for Other Services   PT consult;Speech consult     Precautions/Restrictions   Precautions Precautions: Fall Recall of  Precautions/Restrictions: Intact Restrictions Weight Bearing Restrictions Per Provider Order: No     Mobility Bed Mobility Overal bed mobility: Needs Assistance Bed Mobility: Supine to Sit, Sit to Supine     Supine to sit: Supervision Sit to supine: Supervision   General bed mobility comments: no physical assist needed    Transfers Overall transfer level: Needs assistance Equipment used: Rolling walker (2 wheels), None Transfers: Sit to/from Stand, Bed to chair/wheelchair/BSC Sit to Stand: Contact guard assist     Step pivot transfers: Contact guard assist     General transfer comment: CGA with verbal cues for safe hand placement with RW and RW positioning      Balance Overall balance assessment: Needs assistance Sitting-balance support: Single extremity supported Sitting balance-Leahy Scale: Good     Standing balance support: Single extremity supported, During functional activity, No upper extremity supported Standing balance-Leahy Scale: Poor Standing balance comment: requires UE support but not necessarily on AD.                           ADL either performed or assessed with clinical judgement   ADL Overall ADL's : Needs assistance/impaired                                             Vision Ability to See in Adequate Light: 1 Impaired Patient Visual Report: Blurring of vision (decreased vision on L) Vision Assessment?: Yes;Vision impaired- to be further tested in functional context Eye Alignment: Within Functional Limits Ocular Range of Motion: Within Functional Limits (with tracking/cues) Alignment/Gaze Preference: Within Defined  Limits Tracking/Visual Pursuits: Decreased smoothness of horizontal tracking;Requires cues, head turns, or add eye shifts to track;Impaired - to be further tested in functional context Saccades: Additional head turns occurred during testing;Decreased speed of saccadic movement;Impaired - to be further  tested in functional context Visual Fields: Left visual field deficit;Impaired-to be further tested in functional context (especially during functional tasks, Pt requires cues) Additional Comments: L inattention vs visual deficit. Pt reports that when he looks at words he only sees the 2nd half of them     Perception Perception: Impaired Preception Impairment Details: Inattention/Neglect Perception-Other Comments: L inattention functionally during tasks   Praxis         Pertinent Vitals/Pain Pain Assessment Pain Assessment: Faces Faces Pain Scale: No hurt Pain Intervention(s): Monitored during session, Repositioned     Extremity/Trunk Assessment Upper Extremity Assessment Upper Extremity Assessment: Generalized weakness (able to complete BUE tasks at the sink)   Lower Extremity Assessment Lower Extremity Assessment: Defer to PT evaluation   Cervical / Trunk Assessment Cervical / Trunk Assessment: Normal   Communication Communication Communication: Impaired Factors Affecting Communication: Hearing impaired   Cognition Arousal: Alert Behavior During Therapy: WFL for tasks assessed/performed Cognition: Cognition impaired     Awareness: Intellectual awareness impaired, Online awareness impaired Memory impairment (select all impairments): Short-term memory, Working memory Attention impairment (select first level of impairment): Sustained attention Executive functioning impairment (select all impairments): Organization, Problem solving, Sequencing, Reasoning OT - Cognition Comments: decreased safety awareness, forgetful. Could not remember what we were supposed to do next, or recall what we did at the end of the session. L inattention without awarness, very pleasant, requires multimodal cues could not figure out how to get TP out of the holder post-toileting                 Following commands: Intact (benefits from multimodal cues and extra time)        Cueing  General Comments   Cueing Techniques: Verbal cues      Exercises     Shoulder Instructions      Home Living Family/patient expects to be discharged to:: Private residence Living Arrangements: Alone Available Help at Discharge: Family;Available PRN/intermittently;Personal care attendant (T/W/F 4 hours) Type of Home: Apartment Home Access: Stairs to enter Entrance Stairs-Number of Steps: 3 Entrance Stairs-Rails: Right;Left Home Layout: One level     Bathroom Shower/Tub: Chief Strategy Officer: Handicapped height Bathroom Accessibility: Yes How Accessible: Accessible via walker Home Equipment: Rolling Walker (2 wheels);Cane - single point;Shower seat          Prior Functioning/Environment Prior Level of Function : Independent/Modified Independent;Driving             Mobility Comments: Pt uses uses SPC for mobility. ADLs Comments: Pt has a care taker 4 hours Tues/Wed/Fri helps with cleaning, groceries, They can help with meals/transportation. Pt does own medication and finances.    OT Problem List: Decreased strength;Decreased activity tolerance;Impaired balance (sitting and/or standing);Impaired vision/perception;Decreased cognition;Decreased safety awareness;Decreased knowledge of use of DME or AE   OT Treatment/Interventions: Self-care/ADL training;Therapeutic exercise;Neuromuscular education;DME and/or AE instruction;Therapeutic activities;Cognitive remediation/compensation;Visual/perceptual remediation/compensation;Patient/family education;Balance training      OT Goals(Current goals can be found in the care plan section)   Acute Rehab OT Goals Patient Stated Goal: get to the bathroom and wash up OT Goal Formulation: With patient Time For Goal Achievement: 10/13/23 Potential to Achieve Goals: Good ADL Goals Pt Will Perform Eating: with set-up;sitting Pt Will Perform Grooming: with supervision;standing Pt Will  Perform Upper Body  Dressing: with modified independence;sitting Pt Will Perform Lower Body Dressing: with contact guard assist;sit to/from stand Pt Will Transfer to Toilet: with supervision;ambulating Pt Will Perform Toileting - Clothing Manipulation and hygiene: with supervision;sit to/from stand Additional ADL Goal #1: Pt will locate and use grooming items on the left for ADL task without cues   OT Frequency:  Min 2X/week    Co-evaluation              AM-PAC OT "6 Clicks" Daily Activity     Outcome Measure Help from another person eating meals?: A Little Help from another person taking care of personal grooming?: A Little Help from another person toileting, which includes using toliet, bedpan, or urinal?: A Little Help from another person bathing (including washing, rinsing, drying)?: A Lot Help from another person to put on and taking off regular upper body clothing?: A Little Help from another person to put on and taking off regular lower body clothing?: A Lot 6 Click Score: 16   End of Session Equipment Utilized During Treatment: Gait belt;Rolling walker (2 wheels) Nurse Communication: Mobility status  Activity Tolerance: Patient tolerated treatment well Patient left: in bed;with call bell/phone within reach;with bed alarm set;with nursing/sitter in room  OT Visit Diagnosis: Unsteadiness on feet (R26.81);Muscle weakness (generalized) (M62.81);Other abnormalities of gait and mobility (R26.89);Low vision, both eyes (H54.2);Other symptoms and signs involving the nervous system (R29.898);Other symptoms and signs involving cognitive function                Time: 1610-9604 OT Time Calculation (min): 40 min Charges:  OT General Charges $OT Visit: 1 Visit OT Evaluation $OT Eval Moderate Complexity: 1 Mod OT Treatments $Self Care/Home Management : 8-22 mins $Therapeutic Activity: 8-22 mins  Chales [place] OTR/L Acute Rehabilitation Services Office: [phone]  Ebony Goldstein [facility] 09/29/2023, 11:47  AM

## 2023-09-29 NOTE — Progress Notes (Signed)
 [facility] Progress Note Patient: Ryan Montgomery ZOX:[id number] DOB: 03/21/1956 DOA: 09/26/2023  DOS: the patient was seen and examined on 09/29/2023  Brief Hospital Course: Patient with PMH of cryptogenic stroke, hep C, HTN, alcohol abuse, active smoker, seizures, gout, recurrent syncope presented to the hospital with complaints of slurred speech. Patient was out of the duration for tPA Neurology was consulted.  Further workup initiated. Incidentally was found to have episodes of SVT as well as prolonged sinus pauses associated with his recurrent syncopal events in the past on ILR interrogation.  Cardiology consulted.  Assessment and Plan: Recurrent acute ischemic infarct. Right posterior MCA territory.  CT code stroke wedge-shaped area of edema in the right.  Occipital region. MRI brain confirms acute right MCA territory infarct with cerebral edema without mass effect. Neurology consulted. Hypercoagulable workup initiated which so far is negative. CT chest Abdo pelvis to look for occult malignancy is also negative. Echocardiogram EF normal, grade 1 diastolic dysfunction, no significant valvular abnormality. LDL 63.  Hemoglobin A1c 4.4. Patient was on 81 mg aspirin  although noncompliant. Current recommendation is 81 mg aspirin  plus Plavix  for 3 weeks followed by 8 aspirin  alone. Actively smokes, recommended to quit smoking. Due to ongoing complaints of confusion a CT head was performed which is unremarkable for a new stroke. PT was able to see finally patient on 5/6.  Recommending SNF.  TOC consulted.   Suspected COPD with exacerbation. Acute sinusitis and bronchitis Patient reports sinus headache.  Bilateral expiratory wheezing on exam.  CT shows evidence of bronchitis. Continue Augmentin .  Continue nebulizer.     HTN. On metoprolol  at home.  Blood pressure stable.  Continue.   HLD. Goal less than 70, currently LDL is 63. Continue statin atorvastatin  20 mg.   Active  smoker. Smokes 3 to 4 cigarettes a day. Patient currently willing to quit.  Nicotine  therapy on discharge.   Alcohol abuse. Acute encephalopathy Alcohol level is 167.  Recommended to stop abusing alcohol in the setting of SVTs. Somewhat confused on 5/6 and 5/5.  Although no significant withdrawal, delirium cannot be ruled out.  No tremors. Initiating CIWA protocol with Librium.   Obesity Class 1 Body mass index is 30.68 kg/m.  Placing the pt at higher risk of poor outcomes. Recommended weight loss to reduce the risk of the stroke.   SVT Sinus pause. Patient has a loop recorder placed for cryptogenic stroke. In April 2024 patient presented with a syncopal event.  This was consistent with SVT seen on the ILR. Patient also had an ED visit in November 2024 for reported 'flat lining' episode requiring "CPR" at home.  This is concurrent with his episode of sinus pause seen on ILR. Cardiology consulted.  EP consulted.  Recommend continuous observation but no intervention. Appreciate assistance.     Subjective: No nausea vomiting no fever no chills.  Physical Exam: General: in Mild distress, No Rash Cardiovascular: S1 and S2 Present, No Murmur Respiratory: Good respiratory effort, Bilateral Air entry present. No Crackles, No wheezes Abdomen: Bowel Sound present, No tenderness Extremities: No edema Neuro: Alert and oriented x3, no new focal deficit left homonymous hemianopia.  Data Reviewed: I have Reviewed nursing notes, Vitals, and Lab results. Since last encounter, pertinent lab results CBC and BMP   . I have ordered test including CBC and BMP  .   Disposition: Status is: Inpatient Remains inpatient appropriate because: Awaiting placement.  enoxaparin  (LOVENOX ) injection 40 mg Start: 09/26/23 2200   Family Communication: Family at bedside Level  of care: Telemetry Medical   Vitals:   09/29/23 1205 09/29/23 1628 09/29/23 1800 09/29/23 1939  BP: (!) 141/90 139/89 (!) 142/87  121/82  Pulse: 79 79 76 96  Resp: 18 18  18   Temp: 98.2 F (36.8 C) 98.3 F (36.8 C)  98.5 F (36.9 C)  TempSrc: Oral Oral  Oral  SpO2: 98% 96%  96%  Weight:      Height:         Author: Charlean Congress, MD 09/29/2023 7:44 PM  Please look on [email or web address] to find out who is on call.

## 2023-09-30 DIAGNOSIS — I639 Cerebral infarction, unspecified: Secondary | ICD-10-CM | POA: Diagnosis not present

## 2023-09-30 LAB — BASIC METABOLIC PANEL WITH GFR
Anion gap: 9 (ref 5–15)
BUN: 9 mg/dL (ref 8–23)
CO2: 20 mmol/L — ABNORMAL LOW (ref 22–32)
Calcium: 9.1 mg/dL (ref 8.9–10.3)
Chloride: 109 mmol/L (ref 98–111)
Creatinine, Ser: 1.58 mg/dL — ABNORMAL HIGH (ref 0.61–1.24)
GFR, Estimated: 48 mL/min — ABNORMAL LOW (ref >60)
Glucose, Bld: 111 mg/dL — ABNORMAL HIGH (ref 70–99)
Potassium: 3.5 mmol/L (ref 3.5–5.1)
Sodium: 138 mmol/L (ref 135–145)

## 2023-09-30 LAB — CBC
HCT: 41.6 % (ref 39.0–52.0)
Hemoglobin: 14 g/dL (ref 13.0–17.0)
MCH: 31 pg (ref 26.0–34.0)
MCHC: 33.7 g/dL (ref 30.0–36.0)
MCV: 92.2 fL (ref 80.0–100.0)
Platelets: 152 10*3/uL (ref 150–400)
RBC: 4.51 MIL/uL (ref 4.22–5.81)
RDW: 12.8 % (ref 11.5–15.5)
WBC: 8.1 10*3/uL (ref 4.0–10.5)
nRBC: 0 % (ref 0.0–0.2)

## 2023-09-30 LAB — MAGNESIUM: Magnesium: 1.8 mg/dL (ref 1.7–2.4)

## 2023-09-30 NOTE — Progress Notes (Signed)
 Ryan Montgomery  VZD:[id number] DOB: 09-04-1955 DOA: 09/26/2023 PCP: Donley Furth, MD    Brief Narrative:  68 year old with a history of hepatitis C, HTN, alcohol abuse, tobacco abuse, seizure disorder, gout, cryptogenic stroke, and recurrent syncope who presented to the hospital 09/26/2023 with slurred speech.  During his hospitalization he was incidentally noted to experience episodes of SVT as well as prolonged sinus pauses.  Goals of Care:   Code Status: Full Code   DVT prophylaxis: enoxaparin  (LOVENOX ) injection 40 mg Start: 09/26/23 2200   Interim Hx: Afebrile.  Vital signs stable.  The patient is sitting up in a bedside chair.  He is alert and conversant.  At times he is very mildly confused, but he is easily able to be redirected.  He can tell me where he is and why he is here.  He recognizes his ex-wife at the bedside and is able to tell me about his 2 daughters and what they are doing with their lives at present.  Assessment & Plan:  Recurrent acute ischemic CVA - right posterior MCA territory CT code stroke noted wedge-shaped area of edema in the R occipital region MRI brain confirmed acute right MCA territory infarct with cerebral edema without mass effect Hypercoagulable workup initiated which so far is negative CT chest/abd/pelvis to look for occult malignancy negative TTE noted EF normal, grade 1 diastolic dysfunction, no significant valvular abnormality LDL 63 A1c 4.4 Patient was on 81 mg aspirin  although noncompliant - current recommendation is 81 mg aspirin  plus Plavix  for 3 weeks followed by 81mg  aspirin  alone Actively smokes, recommended to quit smoking Due to ongoing complaints of confusion a repeat CT head was performed which was unremarkable for a new stroke PT is recommending SNF  Suspected COPD w/ acute exacerbation - tobacco abuse - acute sinusitis w/ bronchitis Being treated with inhaled medications and Augmentin  -clinically resolved at this time  HTN Blood  pressure presently stable  HLD LDL currently at goal at 63 -continue atorvastatin   Alcohol abuse Has been counseled on need to avoid any further use of alcohol  Acute metabolic encephalopathy Felt to possibly represent low-grade alcohol withdrawal - treated with CIWA protocol and Librium -mental status greatly improved at this time  SVT with sinus pauses Patient has an indwelling loop recorder as part of her previous workup for cryptogenic stroke and intermittent syncopal spells -syncope appears concurrent with sinus pauses on his loop recorder -cardiology evaluated during this hospital stay and recommended continued observation with no acute intervention  Aortic root dilatation Follow-up in 1 year with a CT is suggested by cardiology and will be arranged by cardiology  Obesity class I - Body mass index is 30.68 kg/m.    Family Communication: Spoke with patient and his ex-wife at bedside at length Disposition: Awaiting SNF placement   Objective: Blood pressure 101/70, pulse 77, temperature (!) 97.5 F (36.4 C), temperature source Oral, resp. rate 16, height 5\' 11"  (1.803 m), weight 99.8 kg, SpO2 96%.  Intake/Output Summary (Last 24 hours) at 09/30/2023 1100 Last data filed at 09/30/2023 2951 Gross per 24 hour  Intake 1020 ml  Output 650 ml  Net 370 ml   Filed Weights   09/26/23 1155  Weight: 99.8 kg    Examination: General: No acute respiratory distress Lungs: Clear to auscultation bilaterally without wheezes or crackles Cardiovascular: Regular rate and rhythm without murmur gallop or rub normal S1 and S2 Abdomen: Nontender, nondistended, soft, bowel sounds positive, no rebound, no ascites, no appreciable mass  Extremities: No significant cyanosis, clubbing, or edema bilateral lower extremities  CBC: Recent Labs  Lab 09/26/23 1250 09/27/23 0708 09/28/23 0611 09/29/23 0549 09/30/23 0524  WBC 9.9   < > 13.0* 11.3* 8.1  NEUTROABS 3.4  --   --   --   --   HGB 13.5    < > 14.9 13.8 14.0  HCT 41.5   < > 44.3 40.2 41.6  MCV 95.6   < > 92.3 91.4 92.2  PLT 179   < > 173 156 152   < > = values in this interval not displayed.   Basic Metabolic Panel: Recent Labs  Lab 09/28/23 0611 09/29/23 0549 09/30/23 0524  NA 133* 139 138  K 3.8 3.6 3.5  CL 100 108 109  CO2 21* 19* 20*  GLUCOSE 95 101* 111*  BUN 9 10 9   CREATININE 1.49* 1.28* 1.58*  CALCIUM  9.2 8.8* 9.1  MG 1.8 1.7 1.8   GFR: Estimated Creatinine Clearance: 55.4 mL/min (A) (by C-G formula based on SCr of 1.58 mg/dL (H)).   Scheduled Meds:  amLODipine   2.5 mg Oral Daily   amoxicillin -clavulanate  1 tablet Oral Q12H   aspirin  EC  81 mg Oral Daily   chlordiazePOXIDE  25 mg Oral QID   Followed by   Cecily Cohen ON 10/01/2023] chlordiazePOXIDE  25 mg Oral TID   Followed by   Cecily Cohen ON 10/02/2023] chlordiazePOXIDE  25 mg Oral BH-qamhs   Followed by   Cecily Cohen ON 10/03/2023] chlordiazePOXIDE  25 mg Oral Daily   clopidogrel   75 mg Oral Daily   enoxaparin  (LOVENOX ) injection  40 mg Subcutaneous Q24H   fluticasone  2 spray Each Nare Daily   fluticasone furoate-vilanterol  1 puff Inhalation Daily   multivitamin with minerals  1 tablet Oral Daily   Continuous Infusions:   LOS: 4 days   Abbe Abate, MD [facility] Office  [phone] Pager - Text Page per Amion  If 7PM-7AM, please contact night-coverage per Amion 09/30/2023, 11:00 AM

## 2023-09-30 NOTE — Plan of Care (Signed)
  Problem: Education: Goal: Knowledge of disease or condition will improve 09/30/2023 0544 by Bart Born, RN Outcome: Progressing 09/30/2023 0503 by Bart Born, RN Outcome: Progressing   Problem: Ischemic Stroke/TIA Tissue Perfusion: Goal: Complications of ischemic stroke/TIA will be minimized 09/30/2023 0544 by Bart Born, RN Outcome: Progressing 09/30/2023 0503 by Bart Born, RN Outcome: Progressing   Problem: Coping: Goal: Will verbalize positive feelings about self 09/30/2023 0544 by Bart Born, RN Outcome: Progressing 09/30/2023 0503 by Bart Born, RN Outcome: Progressing   Problem: Self-Care: Goal: Ability to participate in self-care as condition permits will improve 09/30/2023 0544 by Bart Born, RN Outcome: Progressing 09/30/2023 0503 by Bart Born, RN Outcome: Progressing   Problem: Clinical Measurements: Goal: Cardiovascular complication will be avoided 09/30/2023 0544 by Bart Born, RN Outcome: Progressing 09/30/2023 0503 by Bart Born, RN Outcome: Progressing   Problem: Activity: Goal: Risk for activity intolerance will decrease 09/30/2023 0544 by Bart Born, RN Outcome: Progressing 09/30/2023 0503 by Bart Born, RN Outcome: Progressing   Problem: Nutrition: Goal: Adequate nutrition will be maintained 09/30/2023 0544 by Bart Born, RN Outcome: Progressing 09/30/2023 0503 by Bart Born, RN Outcome: Progressing   Problem: Safety: Goal: Ability to remain free from injury will improve 09/30/2023 0544 by Bart Born, RN Outcome: Progressing 09/30/2023 0503 by Bart Born, RN Outcome: Progressing

## 2023-09-30 NOTE — TOC Progression Note (Signed)
 Transition of Care [facility]) - Progression Note    Patient Details  Name: Ryan Montgomery MRN: [id number] Date of Birth: 04-Sep-1955  Transition of Care [facility] CM/SW Contact  Tandy Fam, [place] Phone Number: 09/30/2023, 7:35 PM  Clinical Narrative:   CSW spoke with patient's ex-wife, [name], this morning and later met with [name] and patient to discuss SNF recommendation. Patient and ex-wife in agreement, preference for [facility]. CSW explained that SNF would need to be covered under patient's [facility] Medicare prior to [place] covering anything, and patient and spouse indicated understanding. CSW sent referral to [facility], [facility] can accept. CSW requested CMA to initiate insurance authorization request. Patient also interested in completing HCPOA documentation, CSW requested RN to place order for spiritual care. CSW to follow.    Expected Discharge Plan: Skilled Nursing Facility Barriers to Discharge: Continued Medical Work up, English as a second language teacher  Expected Discharge Plan and Services   Discharge Planning Services: CM Consult   Living arrangements for the past 2 months: Apartment                                       Social Determinants of Health (SDOH) Interventions SDOH Screenings   Food Insecurity: No Food Insecurity (09/27/2023)  Housing: Unknown (09/27/2023)  Transportation Needs: No Transportation Needs (09/27/2023)  Utilities: Not At Risk (09/27/2023)  Depression (PHQ2-9): Low Risk  (12/30/2022)  Social Connections: Unknown (09/27/2023)  Tobacco Use: High Risk (09/26/2023)    Readmission Risk Interventions     No data to display

## 2023-09-30 NOTE — Progress Notes (Signed)
    [facility] will sign off.   Medication Recommendations:  As on MAR Other recommendations (labs, testing, etc):  None Follow up as an outpatient:  Scheduled for general cardiology and EP follow up.

## 2023-09-30 NOTE — Plan of Care (Signed)
   Problem: Ischemic Stroke/TIA Tissue Perfusion: Goal: Complications of ischemic stroke/TIA will be minimized Outcome: Progressing

## 2023-09-30 NOTE — Plan of Care (Signed)
  Problem: Nutrition: Goal: Risk of aspiration will decrease Outcome: Progressing   Problem: Education: Goal: Knowledge of General Education information will improve Description: Including pain rating scale, medication(s)/side effects and non-pharmacologic comfort measures Outcome: Progressing   Problem: Health Behavior/Discharge Planning: Goal: Ability to manage health-related needs will improve Outcome: Progressing   Problem: Clinical Measurements: Goal: Ability to maintain clinical measurements within normal limits will improve Outcome: Progressing Goal: Will remain free from infection Outcome: Progressing Goal: Diagnostic test results will improve Outcome: Progressing Goal: Cardiovascular complication will be avoided Outcome: Progressing   Problem: Activity: Goal: Risk for activity intolerance will decrease Outcome: Progressing   Problem: Nutrition: Goal: Adequate nutrition will be maintained Outcome: Progressing   Problem: Elimination: Goal: Will not experience complications related to bowel motility Outcome: Progressing   Problem: Pain Managment: Goal: General experience of comfort will improve and/or be controlled Outcome: Progressing   Problem: Safety: Goal: Ability to remain free from injury will improve Outcome: Progressing   Problem: Skin Integrity: Goal: Risk for impaired skin integrity will decrease Outcome: Progressing

## 2023-09-30 NOTE — Plan of Care (Signed)

## 2023-09-30 NOTE — [place] FL2 (Signed)
   MEDICAID FL2 LEVEL OF CARE FORM     IDENTIFICATION  Patient Name: Ryan Montgomery Birthdate: 31-Oct-1955 Sex: male Admission Date (Current Location): 09/26/2023  [facility] and [place] Number:  Producer, television/film/video and Address:  [facility] [place], [address] [place], [place] [address]      Provider Number: [id number]  Attending Physician Name and Address:  Abbe Abate, MD  Relative Name and Phone Number:       Current Level of Care: Hospital Recommended Level of Care: Skilled Nursing Facility Prior Approval Number:    Date Approved/Denied:   PASRR Number: [id number]  Discharge Plan: SNF    Current Diagnoses: Patient Active Problem List   Diagnosis Date Noted   HTN (hypertension) 04/27/2023   Aortic dilatation (HCC) 06/04/2021   Dysphagia 06/04/2021   Acute CVA (cerebrovascular accident) (HCC) 06/03/2021   AKI (acute kidney injury) (HCC) 06/03/2021   Tobacco use 06/03/2021   Chronic pain in left foot 05/14/2021   Rib fractures 09/09/2020   Chronic left hip pain 12/09/2019   Seizure disorder (HCC)    Syncope 11/27/2019   Alcohol abuse 08/29/2019   Gout 08/29/2019   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   Unspecified fracture of left acetabulum, initial encounter for closed fracture (HCC) 04/03/2019   History of CVA (cerebrovascular accident) 2018   Hepatitis C 07/18/2013    Orientation RESPIRATION BLADDER Height & Weight     Self, Situation, Place  Normal Incontinent Weight: 220 lb (99.8 kg) Height:  5\' 11"  (180.3 cm)  BEHAVIORAL SYMPTOMS/MOOD NEUROLOGICAL BOWEL NUTRITION STATUS      Continent Diet (heart healthy)  AMBULATORY STATUS COMMUNICATION OF NEEDS Skin   Limited Assist Verbally Normal                       Personal Care Assistance Level of Assistance  Bathing, Feeding, Dressing Bathing Assistance: Maximum assistance Feeding assistance: Limited assistance Dressing Assistance: Limited assistance      Functional Limitations Info  Hearing   Hearing Info: Impaired (hard of hearing)      SPECIAL CARE FACTORS FREQUENCY  PT (By licensed PT), OT (By licensed OT)     PT Frequency: 5x/wk OT Frequency: 5x/wk            Contractures Contractures Info: Not present    Additional Factors Info  Code Status, Allergies Code Status Info: Full Allergies Info: Moxifloxacin           Current Medications (09/30/2023):  This is the current hospital active medication list Current Facility-Administered Medications  Medication Dose Route Frequency Provider Last Rate Last Admin   acetaminophen  (TYLENOL ) tablet 650 mg  650 mg Oral Q6H PRN Danice Dural, MD   650 mg at 09/28/23 1556   Or   acetaminophen  (TYLENOL ) suppository 650 mg  650 mg Rectal Q6H PRN Danice Dural, MD       amLODipine  (NORVASC ) tablet 2.5 mg  2.5 mg Oral Daily Eilleen Grates, MD   2.5 mg at 09/29/23 2536   amoxicillin -clavulanate (AUGMENTIN ) 875-125 MG per tablet 1 tablet  1 tablet Oral Q12H Patel, Pranav M, MD   1 tablet at 09/29/23 2149   aspirin  EC tablet 81 mg  81 mg Oral Daily Arora, Ashish, MD   81 mg at 09/29/23 6440   chlordiazePOXIDE (LIBRIUM) capsule 25 mg  25 mg Oral Q6H PRN Patel, Pranav M, MD       chlordiazePOXIDE (LIBRIUM) capsule 25 mg  25 mg Oral QID Patel, Pranav M, MD   25 mg at 09/29/23 2149   Followed by   Cecily Cohen ON 10/01/2023] chlordiazePOXIDE (LIBRIUM) capsule 25 mg  25 mg Oral TID Patel, Pranav M, MD       Followed by   Cecily Cohen ON 10/02/2023] chlordiazePOXIDE (LIBRIUM) capsule 25 mg  25 mg Oral BH-qamhs Patel, Pranav M, MD       Followed by   Cecily Cohen ON 10/03/2023] chlordiazePOXIDE (LIBRIUM) capsule 25 mg  25 mg Oral Daily Patel, Pranav M, MD       clopidogrel  (PLAVIX ) tablet 75 mg  75 mg Oral Daily Arora, Ashish, MD   75 mg at 09/29/23 1610   enoxaparin  (LOVENOX ) injection 40 mg  40 mg Subcutaneous Q24H Danice Dural, MD   40 mg at 09/29/23 2149   fluticasone (FLONASE) 50 MCG/ACT nasal  spray 2 spray  2 spray Each Nare Daily Kraig Peru, MD   2 spray at 09/29/23 0839   fluticasone furoate-vilanterol (BREO ELLIPTA) 100-25 MCG/ACT 1 puff  1 puff Inhalation Daily Patel, Pranav M, MD   1 puff at 09/30/23 0703   hydrOXYzine (ATARAX) tablet 25 mg  25 mg Oral Q6H PRN Patel, Pranav M, MD       ipratropium-albuterol (DUONEB) 0.5-2.5 (3) MG/3ML nebulizer solution 3 mL  3 mL Nebulization Q4H PRN Patel, Pranav M, MD       loperamide (IMODIUM) capsule 2-4 mg  2-4 mg Oral PRN Patel, Pranav M, MD       multivitamin with minerals tablet 1 tablet  1 tablet Oral Daily Patel, Pranav M, MD   1 tablet at 09/29/23 1744   ondansetron  (ZOFRAN ) tablet 4 mg  4 mg Oral Q6H PRN Danice Dural, MD       Or   ondansetron  (ZOFRAN ) injection 4 mg  4 mg Intravenous Q6H PRN Danice Dural, MD         Discharge Medications: Please see discharge summary for a list of discharge medications.  Relevant Imaging Results:  Relevant Lab Results:   Additional Information SS#: [id number]  Tandy Fam, LCSW

## 2023-09-30 NOTE — Progress Notes (Signed)
 Physical Therapy Treatment Patient Details Name: Ryan Montgomery MRN: [id number] DOB: [birth year] Today's Date: 09/30/2023   History of Present Illness Pt is 68 yo presenting to [facility] ED initially on 5/3; transferred to [facility] due to family concerns for speech being slow and slurred. CT at [facility] ER with findings of wedge shaped subacute infarct in R hemisphere per neurologist note. PMH: CVA with loop recorder in place, Hep C and tobacco abuse.    PT Comments  Focus on obstacle navigation with RW this session. Pt has difficulty drifting tot he L and running into objects on the L requiring Min A for AD navigation in order to decrease running into objects on the L. Worked on scanning ahead with pt accuracy in avoiding objects less than 20% of the time. Due to pt current functional status, home set up and available assistance at home recommending skilled physical therapy services < 3 hours/day in order to address strength, balance and functional mobility to decrease risk for falls, injury, immobility, skin break down and re-hospitalization.     If plan is discharge home, recommend the following: A little help with walking and/or transfers;Assistance with cooking/housework;Assist for transportation;Help with stairs or ramp for entrance;Supervision due to cognitive status   Can travel by private vehicle     Yes  Equipment Recommendations  Rolling walker (2 wheels)       Precautions / Restrictions Precautions Precautions: Fall Recall of Precautions/Restrictions: Impaired Restrictions Weight Bearing Restrictions Per Provider Order: No     Mobility  Bed Mobility Overal bed mobility: Needs Assistance Bed Mobility: Supine to Montgomery     Supine to Montgomery: Supervision     General bed mobility comments: no physical assist needed    Transfers Overall transfer level: Needs assistance Equipment used: Rolling walker (2 wheels) Transfers: Montgomery to/from Stand, Bed to chair/wheelchair/BSC Montgomery to Stand: Contact  guard assist   Step pivot transfers: Contact guard assist       General transfer comment: CGA with verbal cues for safe hand placement with RW and RW positioning    Ambulation/Gait Ambulation/Gait assistance: Min assist Gait Distance (Feet): 40 Feet Assistive device: Rolling walker (2 wheels) Gait Pattern/deviations: Step-through pattern Gait velocity: reduced Gait velocity interpretation: <1.8 ft/sec, indicate of risk for recurrent falls   General Gait Details: no knee buckling this session. Worked on obstacle navigation in close quarters with scanning ahead to the L with less than 20% accuracy    Modified Rankin (Stroke Patients Only) Modified Rankin (Stroke Patients Only) Pre-Morbid Rankin Score: Slight disability Modified Rankin: Moderately severe disability     Balance Overall balance assessment: Needs assistance Sitting-balance support: No upper extremity supported, Feet supported Sitting balance-Leahy Scale: Good     Standing balance support: Single extremity supported, Reliant on assistive device for balance Standing balance-Leahy Scale: Poor Standing balance comment: requires UE support but not necessarily on AD.      Communication Communication Communication: Impaired Factors Affecting Communication: Hearing impaired  Cognition Arousal: Alert Behavior During Therapy: Impulsive   PT - Cognitive impairments: Awareness, Safety/Judgement       Following commands: Intact      Cueing Cueing Techniques: Verbal cues     General Comments General comments (skin integrity, edema, etc.): Pt continues to demonstrate mild L inattention with difficutly scanning to the L.      Pertinent Vitals/Pain Pain Assessment Pain Assessment: No/denies pain Faces Pain Scale: No hurt Pain Intervention(s): Monitored during session     PT Goals (current goals can now be  found in the care plan section) Acute Rehab PT Goals Patient Stated Goal: to return to prior level of  function PT Goal Formulation: With patient Time For Goal Achievement: 10/11/23 Potential to Achieve Goals: Fair Progress towards PT goals: Progressing toward goals    Frequency    Min 3X/week      PT Plan  Continue with current POC        AM-PAC PT "6 Clicks" Mobility   Outcome Measure  Help needed turning from your back to your side while in a flat bed without using bedrails?: A Little Help needed moving from lying on your back to sitting on the side of a flat bed without using bedrails?: A Little Help needed moving to and from a bed to a chair (including a wheelchair)?: A Little Help needed standing up from a chair using your arms (e.g., wheelchair or bedside chair)?: A Little Help needed to walk in hospital room?: A Little Help needed climbing 3-5 steps with a railing? : Total 6 Click Score: 16    End of Session Equipment Utilized During Treatment: Gait belt Activity Tolerance: Patient tolerated treatment well Patient left: in chair;with chair alarm set;with call bell/phone within reach Nurse Communication: Mobility status PT Visit Diagnosis: Unsteadiness on feet (R26.81);Other abnormalities of gait and mobility (R26.89)     Time: 1610-9604 PT Time Calculation (min) (ACUTE ONLY): 10 min  Charges:    $Gait Training: 8-22 mins PT General Charges $$ ACUTE PT VISIT: 1 Visit                    Sloan Duncans, DPT, CLT  Acute Rehabilitation Services Office: [phone] (Secure chat preferred)    Ryan Montgomery 09/30/2023, 12:01 PM

## 2023-10-01 DIAGNOSIS — R404 Transient alteration of awareness: Secondary | ICD-10-CM | POA: Diagnosis not present

## 2023-10-01 DIAGNOSIS — M1000 Idiopathic gout, unspecified site: Secondary | ICD-10-CM | POA: Diagnosis not present

## 2023-10-01 DIAGNOSIS — I639 Cerebral infarction, unspecified: Secondary | ICD-10-CM | POA: Diagnosis not present

## 2023-10-01 DIAGNOSIS — I77819 Aortic ectasia, unspecified site: Secondary | ICD-10-CM | POA: Diagnosis not present

## 2023-10-01 DIAGNOSIS — F418 Other specified anxiety disorders: Secondary | ICD-10-CM | POA: Diagnosis not present

## 2023-10-01 DIAGNOSIS — M79641 Pain in right hand: Secondary | ICD-10-CM | POA: Diagnosis not present

## 2023-10-01 DIAGNOSIS — E785 Hyperlipidemia, unspecified: Secondary | ICD-10-CM | POA: Diagnosis not present

## 2023-10-01 DIAGNOSIS — R262 Difficulty in walking, not elsewhere classified: Secondary | ICD-10-CM | POA: Diagnosis not present

## 2023-10-01 DIAGNOSIS — M25531 Pain in right wrist: Secondary | ICD-10-CM | POA: Diagnosis not present

## 2023-10-01 DIAGNOSIS — Z720 Tobacco use: Secondary | ICD-10-CM | POA: Diagnosis not present

## 2023-10-01 DIAGNOSIS — N179 Acute kidney failure, unspecified: Secondary | ICD-10-CM | POA: Diagnosis not present

## 2023-10-01 DIAGNOSIS — F17200 Nicotine dependence, unspecified, uncomplicated: Secondary | ICD-10-CM | POA: Diagnosis not present

## 2023-10-01 DIAGNOSIS — Z8673 Personal history of transient ischemic attack (TIA), and cerebral infarction without residual deficits: Secondary | ICD-10-CM | POA: Diagnosis not present

## 2023-10-01 DIAGNOSIS — I77810 Thoracic aortic ectasia: Secondary | ICD-10-CM | POA: Diagnosis not present

## 2023-10-01 DIAGNOSIS — I959 Hypotension, unspecified: Secondary | ICD-10-CM | POA: Diagnosis not present

## 2023-10-01 DIAGNOSIS — S0990XA Unspecified injury of head, initial encounter: Secondary | ICD-10-CM | POA: Diagnosis not present

## 2023-10-01 DIAGNOSIS — S0011XA Contusion of right eyelid and periocular area, initial encounter: Secondary | ICD-10-CM | POA: Diagnosis not present

## 2023-10-01 DIAGNOSIS — R6889 Other general symptoms and signs: Secondary | ICD-10-CM | POA: Diagnosis not present

## 2023-10-01 DIAGNOSIS — W19XXXA Unspecified fall, initial encounter: Secondary | ICD-10-CM | POA: Diagnosis not present

## 2023-10-01 DIAGNOSIS — F1010 Alcohol abuse, uncomplicated: Secondary | ICD-10-CM | POA: Diagnosis not present

## 2023-10-01 DIAGNOSIS — G9341 Metabolic encephalopathy: Secondary | ICD-10-CM | POA: Diagnosis not present

## 2023-10-01 DIAGNOSIS — I679 Cerebrovascular disease, unspecified: Secondary | ICD-10-CM | POA: Diagnosis not present

## 2023-10-01 DIAGNOSIS — G936 Cerebral edema: Secondary | ICD-10-CM | POA: Diagnosis not present

## 2023-10-01 DIAGNOSIS — R278 Other lack of coordination: Secondary | ICD-10-CM | POA: Diagnosis not present

## 2023-10-01 DIAGNOSIS — G40909 Epilepsy, unspecified, not intractable, without status epilepticus: Secondary | ICD-10-CM | POA: Diagnosis not present

## 2023-10-01 DIAGNOSIS — M6281 Muscle weakness (generalized): Secondary | ICD-10-CM | POA: Diagnosis not present

## 2023-10-01 DIAGNOSIS — J449 Chronic obstructive pulmonary disease, unspecified: Secondary | ICD-10-CM | POA: Diagnosis not present

## 2023-10-01 DIAGNOSIS — M109 Gout, unspecified: Secondary | ICD-10-CM | POA: Diagnosis not present

## 2023-10-01 DIAGNOSIS — R296 Repeated falls: Secondary | ICD-10-CM | POA: Diagnosis not present

## 2023-10-01 DIAGNOSIS — I471 Supraventricular tachycardia, unspecified: Secondary | ICD-10-CM | POA: Diagnosis not present

## 2023-10-01 DIAGNOSIS — R55 Syncope and collapse: Secondary | ICD-10-CM | POA: Diagnosis not present

## 2023-10-01 DIAGNOSIS — Z743 Need for continuous supervision: Secondary | ICD-10-CM | POA: Diagnosis not present

## 2023-10-01 DIAGNOSIS — R519 Headache, unspecified: Secondary | ICD-10-CM | POA: Diagnosis present

## 2023-10-01 DIAGNOSIS — W06XXXA Fall from bed, initial encounter: Secondary | ICD-10-CM | POA: Diagnosis not present

## 2023-10-01 DIAGNOSIS — Z7902 Long term (current) use of antithrombotics/antiplatelets: Secondary | ICD-10-CM | POA: Diagnosis not present

## 2023-10-01 DIAGNOSIS — I10 Essential (primary) hypertension: Secondary | ICD-10-CM | POA: Diagnosis not present

## 2023-10-01 DIAGNOSIS — J40 Bronchitis, not specified as acute or chronic: Secondary | ICD-10-CM | POA: Diagnosis not present

## 2023-10-01 DIAGNOSIS — J441 Chronic obstructive pulmonary disease with (acute) exacerbation: Secondary | ICD-10-CM | POA: Diagnosis not present

## 2023-10-01 LAB — BASIC METABOLIC PANEL WITH GFR
Anion gap: 11 (ref 5–15)
BUN: 12 mg/dL (ref 8–23)
CO2: 21 mmol/L — ABNORMAL LOW (ref 22–32)
Calcium: 9.2 mg/dL (ref 8.9–10.3)
Chloride: 107 mmol/L (ref 98–111)
Creatinine, Ser: 1.49 mg/dL — ABNORMAL HIGH (ref 0.61–1.24)
GFR, Estimated: 51 mL/min — ABNORMAL LOW (ref >60)
Glucose, Bld: 105 mg/dL — ABNORMAL HIGH (ref 70–99)
Potassium: 3.5 mmol/L (ref 3.5–5.1)
Sodium: 139 mmol/L (ref 135–145)

## 2023-10-01 MED ORDER — ADULT MULTIVITAMIN W/MINERALS CH: 1.0000 | ORAL_TABLET | Freq: Every day | ORAL | Status: AC

## 2023-10-01 MED ORDER — FLUTICASONE FUROATE-VILANTEROL 100-25 MCG/ACT IN AEPB: 1.0000 | INHALATION_SPRAY | Freq: Every day | RESPIRATORY_TRACT | Status: AC

## 2023-10-01 MED ORDER — CHLORDIAZEPOXIDE HCL 25 MG PO CAPS: ORAL_CAPSULE | ORAL | 0 refills | Status: AC

## 2023-10-01 MED ORDER — AMLODIPINE BESYLATE 2.5 MG PO TABS: 2.5000 mg | ORAL_TABLET | Freq: Every day | ORAL | Status: AC

## 2023-10-01 MED ORDER — CLOPIDOGREL BISULFATE 75 MG PO TABS: 75.0000 mg | ORAL_TABLET | Freq: Every day | ORAL | 0 refills | Status: AC

## 2023-10-01 NOTE — Progress Notes (Signed)
 Raeanne Bull from [facility] called and got report on the patient. She stated understanding.

## 2023-10-01 NOTE — Progress Notes (Signed)
 RN called [facility] to give report and secretary took my number and stated she would have the Nurse give me a call back to get report on the patient.

## 2023-10-01 NOTE — TOC Transition Note (Signed)
 Transition of Care [facility]) - Discharge Note   Patient Details  Name: Ryan Montgomery MRN: [id number] Date of Birth: 03-22-1956  Transition of [facility]) CM/SW Contact:  Tandy Fam, LCSW Phone Number: 10/01/2023, 10:39 AM   Clinical Narrative:  Patient received insurance authorization for SNF and [facility] can accept today. CSW updated MD, sent discharge information to [facility]. CSW updated ex-wife [name], she will collect patient's clothing and take it to [facility]. Transport arranged with [facility] for next available.  Nurse to call report to [phone], Room 218.     Final next level of care: Skilled Nursing Facility Barriers to Discharge: Barriers Resolved   Patient Goals and CMS Choice   CMS Medicare.gov Compare Post Acute Care list provided to:: Patient Choice offered to / list presented to : Patient, Adult Children      Discharge Placement              Patient [facility] bed at: [facility] Patient to be transferred to facility by: [facility] Name of family member notified: [name] Patient and family notified of of transfer: 10/01/23  Discharge Plan and Services Additional resources added to the After Visit Summary for     Discharge Planning Services: CM Consult                                 Social Drivers of Health (SDOH) Interventions SDOH Screenings   Food Insecurity: No Food Insecurity (09/27/2023)  Housing: Unknown (09/27/2023)  Transportation Needs: No Transportation Needs (09/27/2023)  Utilities: Not At Risk (09/27/2023)  Depression (PHQ2-9): Low Risk  (12/30/2022)  Social Connections: Unknown (09/27/2023)  Tobacco Use: High Risk (09/26/2023)     Readmission Risk Interventions     No data to display

## 2023-10-01 NOTE — Progress Notes (Signed)
 10/01/23 1000  Spiritual Encounters  Type of Visit Initial  Care provided to: Patient  Reason for visit Advance directives   Chaplain responded to a consult request for Advance Directive education.  Chaplain provided the Advance Directive packet as well as education on Advance Directives-documents an individual completes to communicate their health care directions in advance of a time when they may need them. Chaplain informed pt the documents which may be completed here in the hospital are the Living Will and Health Care Power of [place].  Chaplain informed that the Health Care Power of [place] is a legal document in which an individual names another person, their Health Care Agent, to make health care decisions when the individual is not able to make them for themselves. The Health Care Agent's function can be temporary or permanent depending on the pt's ability to make and communicate those decisions independently. Chaplain informed pt in the absence of a Health Care Power of Attorney, the state of [place]  directs health care providers to look to the following individuals in the order listed: legal guardian; an attorney?in?fact under a general power of attorney (POA) if that POA includes the right to make health care decisions; a husband or wife; a majority of parents and adult children; a majority of adult brothers and sisters; or an individual who has an established relationship with you, who is acting in good faith and who can convey your wishes.  If none of these person are available or willing to make medical decisions on a patient's behalf, the law allows the patient's doctor to make decisions for them as long as another doctor agrees with those decisions.  Chaplain also informed the patient that the Health Care agent has no decision-making authority over any affairs other than those related to his or her medical care.  The chaplain further educated the pt that a Living Will is a  legal document that allows an individual to state his or her desire not to receive life-prolonging measures in the event that they have a condition that is incurable and will result in their death in a short period of time; they are unconscious, and doctors are confident that they will not regain consciousness; and/or they have advanced dementia or other substantial and irreversible loss of mental function. The chaplain informed pt that life-prolonging measures are medical treatments that would only serve to postpone death, including breathing machines, kidney dialysis, antibiotics, artificial nutrition and hydration (tube feeding), and similar forms of treatment and that if an individual is able to express their wishes, they may also make them known without the use of a Living Will, but in the event that an individual is not able to express their wishes themselves, a Living Will allows medical providers and the pt's family and friends ensure that they are not making decisions on the pt's behalf, but rather serving as the pt's voice to convey decisions the pt has already made.  The patient is aware that the decision to create an advance directive is theirs alone and they may chose not to complete the documents or may chose to complete one portion or both.  The patient was informed that they can revoke the documents at any time by striking through them and writing void or by completing new documents, but that it is also advisable that the individual verbally notify interested parties that their wishes have changed.  They are also aware that the document must be signed in the presence of a notary  public and two witnesses and that this can be done while the patient is still admitted to the hospital or after discharge in the community. If they decide to complete Advance Directives after being discharged from the hospital, they have been advised to notify all interested parties and to provide those documents to their  physicians and loved ones in addition to bringing them to the hospital in the event of another hospitalization.  The chaplain informed the pt that if they desire to proceed with completing Advance Directive Documentation while they are still admitted, notary services are typically available at [facility] between the hours of 1:00 and 3:30 Monday-Thursday.    When the patient is ready to have these documents completed, the patient should request that their nurse place a spiritual care consult and indicate that the patient is ready to have their advance directives notarized so that arrangements for witnesses and notary public can be made.  Please page spiritual care if the patient desires further education or has questions.   M.Kubra Welby Hale Resident [phone]

## 2023-10-01 NOTE — Discharge Summary (Signed)
 DISCHARGE SUMMARY  Ryan Montgomery  MR#: [id number]  DOB:1956-02-16  Date of Admission: 09/26/2023 Date of Discharge: 10/01/2023  Attending Physician:Missey Hasley Constantine Delude, MD  Patient's PCP:Fry, Lenis Quin, MD  Disposition: D/C to SNF for rehab stay   Follow-up Appts:  Follow-up Information     Donley Furth, MD Follow up.   Specialty: Family Medicine Why: Arrange a follow-up visit 1 week after your discharge from the rehab facility. Contact information: 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  Initial presentation: 68 year old with a history of hepatitis C, HTN, alcohol abuse, tobacco abuse, seizure disorder, gout, cryptogenic stroke, and recurrent syncope who presented to the hospital 09/26/2023 with slurred speech. He was admitted for evaluation of suspected CVA. During his hospitalization he was incidentally noted to experience episodes of SVT as well as prolonged sinus pauses.   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 CT code stroke noted wedge-shaped area of edema in the R occipital region MRI brain confirmed acute right MCA territory infarct with cerebral edema without mass effect CT chest/abd/pelvis to look for occult malignancy negative TTE noted EF normal, grade 1 diastolic dysfunction, no significant valvular abnormality LDL 63 A1c 4.4 Patient was on 81 mg aspirin  prior to admit although noncompliant - current recommendation is 81 mg aspirin  plus Plavix  for 3 weeks followed by 81mg  aspirin  alone Actively smokes, recommended to quit smoking - discussed link to CVA and  CAD Due to ongoing complaints of confusion a repeat CT head was performed which was unremarkable for a new stroke PT recommended SNF   Suspected COPD w/ acute exacerbation - tobacco abuse - acute sinusitis w/ bronchitis treated with inhaled medications and Augmentin  - clinically resolved at time of d/c - consider formal PFT testing as outpatient to establish diagnosis of COPD    HTN Blood pressure presently stable   HLD LDL currently at goal at 63 - continue atorvastatin    Alcohol abuse Has been counseled on need to avoid any further use of alcohol   Acute metabolic encephalopathy - low grade alcohol withdrawal Felt to represent low-grade alcohol withdrawal - treated with Librium taper with good results- mental status at baseline at time of d/c with no agitation    SVT with sinus pauses Patient has an indwelling loop recorder as part of a previous workup for cryptogenic stroke and intermittent syncopal spells -syncope appears concurrent with sinus pauses on his loop recorder - Cardiology evaluated during this hospital stay and recommended continued observation with no acute intervention    Aortic root dilatation Follow-up in 1 year with a CT is suggested by cardiology and will be arranged by cardiology   Obesity class I - Body mass index is 30.68 kg/m.    Allergies as of 10/01/2023       Reactions   Moxifloxacin Hives        Medication List     STOP taking these medications    Advil  200 MG tablet Generic drug: ibuprofen    metoprolol  succinate 25 MG 24 hr tablet Commonly known as: TOPROL -XL       TAKE these  medications    amLODipine  2.5 MG tablet Commonly known as: NORVASC  Take 1 tablet (2.5 mg total) by mouth daily. Start taking on: Oct 02, 2023   ARTIFICIAL TEARS PF OP Place 1 drop into both eyes 3 (three) times daily as needed (for dryness).   aspirin  EC 81 MG tablet Take 1 tablet (81 mg total) by mouth daily. Swallow whole.   atorvastatin  20 MG  tablet Commonly known as: LIPITOR Take 1 tablet (20 mg total) by mouth daily.   chlordiazePOXIDE 25 MG capsule Commonly known as: LIBRIUM Take 1 capsule (25 mg total) by mouth 3 (three) times daily for 1 day, THEN 1 capsule (25 mg total) 2 (two) times daily in the am and at bedtime. for 1 day, THEN 1 capsule (25 mg total) daily for 1 day. Start taking on: Oct 01, 2023   clopidogrel  75 MG tablet Commonly known as: PLAVIX  Take 1 tablet (75 mg total) by mouth daily. Start taking on: Oct 02, 2023   fluticasone furoate-vilanterol 100-25 MCG/ACT Aepb Commonly known as: BREO ELLIPTA Inhale 1 puff into the lungs daily. Start taking on: Oct 02, 2023   loratadine 10 MG tablet Commonly known as: CLARITIN Take 10 mg by mouth daily as needed for allergies or rhinitis.   multivitamin with minerals Tabs tablet Take 1 tablet by mouth daily. Start taking on: Oct 02, 2023   TYLENOL  500 MG tablet Generic drug: acetaminophen  Take 500-1,000 mg by mouth every 6 (six) hours as needed (for pain).        Day of Discharge BP (!) 108/57 (BP Location: Right Arm)   Pulse 71   Temp 97.6 F (36.4 C) (Oral)   Resp 18   Ht 5\' 11"  (1.803 m)   Wt 99.8 kg   SpO2 97%   BMI 30.68 kg/m   Physical Exam: General: No acute respiratory distress Lungs: Clear to auscultation bilaterally without wheezes or crackles Cardiovascular: Regular rate and rhythm without murmur gallop or rub normal S1 and S2 Abdomen: Nontender, nondistended, soft, bowel sounds positive, no rebound, no ascites, no appreciable mass Extremities: No significant cyanosis, clubbing, or edema bilateral lower extremities  Basic Metabolic Panel: Recent Labs  Lab 09/26/23 1250 09/27/23 0708 09/28/23 0611 09/29/23 0549 09/30/23 0524  NA 138 138 133* 139 138  K 4.0 3.8 3.8 3.6 3.5  CL 109 104 100 108 109  CO2 21* 25 21* 19* 20*  GLUCOSE 89 97 95 101* 111*  BUN 11 6* 9 10 9   CREATININE 1.22 1.34* 1.49* 1.28* 1.58*  CALCIUM  9.0 9.1 9.2  8.8* 9.1  MG  --   --  1.8 1.7 1.8    CBC: Recent Labs  Lab 09/26/23 1250 09/27/23 0708 09/28/23 0611 09/29/23 0549 09/30/23 0524  WBC 9.9 12.0* 13.0* 11.3* 8.1  NEUTROABS 3.4  --   --   --   --   HGB 13.5 14.5 14.9 13.8 14.0  HCT 41.5 43.8 44.3 40.2 41.6  MCV 95.6 93.8 92.3 91.4 92.2  PLT 179 175 173 156 152    Time spent in discharge (includes decision making & examination of pt): 35 minutes  10/01/2023, 10:04 AM   Abbe Abate, MD [facility] Office  [phone]

## 2023-10-02 DIAGNOSIS — Z720 Tobacco use: Secondary | ICD-10-CM | POA: Diagnosis not present

## 2023-10-02 DIAGNOSIS — I77819 Aortic ectasia, unspecified site: Secondary | ICD-10-CM | POA: Diagnosis not present

## 2023-10-02 DIAGNOSIS — I679 Cerebrovascular disease, unspecified: Secondary | ICD-10-CM | POA: Diagnosis not present

## 2023-10-02 DIAGNOSIS — E785 Hyperlipidemia, unspecified: Secondary | ICD-10-CM | POA: Diagnosis not present

## 2023-10-02 DIAGNOSIS — J449 Chronic obstructive pulmonary disease, unspecified: Secondary | ICD-10-CM | POA: Diagnosis not present

## 2023-10-02 DIAGNOSIS — I471 Supraventricular tachycardia, unspecified: Secondary | ICD-10-CM | POA: Diagnosis not present

## 2023-10-02 DIAGNOSIS — I10 Essential (primary) hypertension: Secondary | ICD-10-CM | POA: Diagnosis not present

## 2023-10-03 DIAGNOSIS — S0990XA Unspecified injury of head, initial encounter: Secondary | ICD-10-CM | POA: Diagnosis not present

## 2023-10-04 ENCOUNTER — Emergency Department (HOSPITAL_COMMUNITY)

## 2023-10-04 ENCOUNTER — Encounter (HOSPITAL_COMMUNITY): Payer: Self-pay

## 2023-10-04 ENCOUNTER — Other Ambulatory Visit: Payer: Self-pay

## 2023-10-04 ENCOUNTER — Emergency Department (HOSPITAL_COMMUNITY)
Admission: EM | Admit: 2023-10-04 | Discharge: 2023-10-04 | Disposition: A | Discharge status: Home / Self Care | Attending: Emergency Medicine | Admitting: Emergency Medicine

## 2023-10-04 DIAGNOSIS — W06XXXA Fall from bed, initial encounter: Secondary | ICD-10-CM | POA: Diagnosis not present

## 2023-10-04 DIAGNOSIS — W19XXXA Unspecified fall, initial encounter: Secondary | ICD-10-CM

## 2023-10-04 DIAGNOSIS — Z7902 Long term (current) use of antithrombotics/antiplatelets: Secondary | ICD-10-CM | POA: Insufficient documentation

## 2023-10-04 DIAGNOSIS — S0011XA Contusion of right eyelid and periocular area, initial encounter: Secondary | ICD-10-CM | POA: Diagnosis not present

## 2023-10-04 DIAGNOSIS — Z8673 Personal history of transient ischemic attack (TIA), and cerebral infarction without residual deficits: Secondary | ICD-10-CM | POA: Insufficient documentation

## 2023-10-04 DIAGNOSIS — S0990XA Unspecified injury of head, initial encounter: Secondary | ICD-10-CM

## 2023-10-04 DIAGNOSIS — R519 Headache, unspecified: Secondary | ICD-10-CM | POA: Diagnosis present

## 2023-10-04 NOTE — ED Triage Notes (Signed)
 Pt BIB [facility] from [facility] - pt had unwitnessed fall from bed on Plavix  - has swelling above right eye.

## 2023-10-04 NOTE — ED Provider Notes (Signed)
 Emergency Department Provider Note   I have reviewed the triage vital signs and the nursing notes.   HISTORY  Chief Complaint No chief complaint on file.   HPI Ryan Montgomery is a 68 y.o. male past history reviewed below including prior stroke on Plavix  presents emergency department after fall from bed.  He is a resident at [employer].  He states that his pillow fell and he was trying to reach on the ground when he rolled out of bed striking a bedside chair in the ground.  He has some bruising over the right eye without vision change.  No reported confusion by staff.  Patient denies pain in any location other than mild headache.   Past Medical History:  Diagnosis Date   Chickenpox    Heat stroke    Hepatitis C    Stroke (HCC)     Review of Systems  Constitutional: No fever/chills Eyes: No visual changes. Cardiovascular: Denies chest pain. Respiratory: Denies shortness of breath. Gastrointestinal: No abdominal pain.   Musculoskeletal: Negative for back pain. Neurological: Positive mild HA.  ____________________________________________   PHYSICAL EXAM:  VITAL SIGNS: Vitals:   10/04/23 0022 10/04/23 0028  BP: 110/60 110/60  Pulse: 80 80  Resp: 17 17  Temp: 98 F (36.7 C) 98 F (36.7 C)  SpO2:  97%    Constitutional: Alert and oriented x 3. Well appearing and in no acute distress. Eyes: Conjunctivae are normal. PERRL (3 mm B/L). Mild supraorbital swelling with ecchymosis.   Head: Atraumatic. Nose: No congestion/rhinnorhea. Mouth/Throat: Mucous membranes are moist.  Neck: No stridor.  No cervical spine tenderness to palpation. Cardiovascular: Normal rate, regular rhythm. Good peripheral circulation. Grossly normal heart sounds.   Respiratory: Normal respiratory effort.  No retractions. Lungs CTAB. Gastrointestinal: Soft and nontender. No distention.  Musculoskeletal: No lower extremity tenderness nor edema. No gross deformities of  extremities.  Normal range of motion of bilateral shoulders and hips without tenderness.  Neurologic:  Normal speech and language. Skin:  Skin is warm, dry and intact. No rash noted.  ____________________________________________  RADIOLOGY  CT Cervical Spine Wo Contrast Result Date: 10/04/2023 CLINICAL DATA:  Initial evaluation for acute trauma. EXAM: CT CERVICAL SPINE WITHOUT CONTRAST TECHNIQUE: Multidetector CT imaging of the cervical spine was performed without intravenous contrast. Multiplanar CT image reconstructions were also generated. RADIATION DOSE REDUCTION: This exam was performed according to the departmental dose-optimization program which includes automated exposure control, adjustment of the mA and/or kV according to patient size and/or use of iterative reconstruction technique. COMPARISON:  None Available. FINDINGS: Alignment: Straightening of the normal cervical lordosis. No listhesis or malalignment. Skull base and vertebrae: Skull base intact. Normal C1-2 articulations are preserved and the dens is intact. Vertebral body heights maintained. No acute fracture. Soft tissues and spinal canal: Soft tissues of the neck demonstrate no acute finding. No abnormal prevertebral edema. Spinal canal within normal limits. Disc levels:  Mild-to-moderate spondylosis at C4-5 through C5-6. Upper chest: Visualized upper chest demonstrates no acute finding. Partially visualized lung apices are clear. Other: None. IMPRESSION: 1. No acute osseous injury within the cervical spine. 2. Mild-to-moderate spondylosis at C4-5 through C5-6. Electronically Signed   By: Virgia Griffins M.D.   On: 10/04/2023 02:56   CT Head Wo Contrast Result Date: 10/04/2023 CLINICAL DATA:  Initial evaluation for acute trauma. EXAM: CT HEAD WITHOUT CONTRAST TECHNIQUE: Contiguous axial images were obtained from the base of the skull through the vertex without intravenous contrast. RADIATION DOSE REDUCTION: This exam  was  performed according to the departmental dose-optimization program which includes automated exposure control, adjustment of the mA and/or kV according to patient size and/or use of iterative reconstruction technique. COMPARISON:  Prior study from 09/28/2023 and earlier. FINDINGS: Brain: Generalize cerebral atrophy with moderately advanced chronic microvascular ischemic disease. Multiple remote infarcts noted involving the bilateral frontal lobes and left occipital lobe. Few scatter remote lacunar infarcts about the hemispheric cerebral white matter and right pons. Few small remote cerebellar infarcts. There has been continued interval evolution of previously identified right MCA distribution infarct, now subacute in appearance, overall similar in size and distribution. No significant regional mass effect. Mild associated petechial blood products at the posterior right occipital region, also seen on prior MRI. No hemorrhagic transformation. No other acute intracranial hemorrhage. No other acute large vessel territory infarct. No mass lesion or midline shift. No hydrocephalus or extra-axial fluid collection. Vascular: No abnormal hyperdense vessel. Scattered vascular calcifications noted within the carotid siphons. Skull: Scalp soft tissues demonstrate no acute finding. Calvarium intact. Sinuses/Orbits: Globes orbital soft tissues within normal limits. Mucosal thickening with moderate air-fluid levels noted within the maxillary sinuses. No significant mastoid effusion. Other: None. IMPRESSION: 1. Normal expected interval evolution of previously identified right MCA distribution infarcts. Mild associated petechial blood products at the posterior right occipital region, also seen on prior MRI. No hemorrhagic transformation or significant regional mass effect. 2. No other acute intracranial abnormality. 3. Generalized cerebral atrophy with moderately advanced chronic microvascular ischemic disease, with multiple remote  infarcts as above. 4. Moderate air-fluid levels within the maxillary sinuses. Clinical correlation for possible acute sinusitis recommended. Electronically Signed   By: Virgia Griffins M.D.   On: 10/04/2023 02:45    ____________________________________________   PROCEDURES  Procedure(s) performed:   Procedures  None  ____________________________________________   INITIAL IMPRESSION / ASSESSMENT AND PLAN / ED COURSE  Pertinent labs & imaging results that were available during my care of the patient were reviewed by me and considered in my medical decision making (see chart for details).   This patient is Presenting for Evaluation of head injury, which does require a range of treatment options, and is a complaint that involves a high risk of morbidity and mortality.  The Differential Diagnoses includes subdural hematoma, epidural hematoma, acute concussion, traumatic subarachnoid hemorrhage, cerebral contusions, etc.  Radiologic Tests Ordered, included CT head and c spine. I independently interpreted the images and agree with radiology interpretation.   Medical Decision Making: Summary:  The patient presents the emergency department after mechanical fall from bed at [facility].  Patient able to provide a history.  No focal neuro deficits. Plan for CT imaging of the head and neck. Clear lungs. Normal ROM of the hips. Plan to defer trauma XR for now.   Reevaluation with update and discussion with patient. No new findings on CT. Patient stable for discharge.   Patient's presentation is most consistent with acute presentation with potential threat to life or bodily function.   Disposition: discharge  ____________________________________________  FINAL CLINICAL IMPRESSION(S) / ED DIAGNOSES  Final diagnoses:  Fall, initial encounter  Injury of head, initial encounter    Note:  This document was prepared using Dragon voice recognition software and may include unintentional  dictation errors.  Abby Hocking, MD, [facility]    Toy Eisemann, Shereen Dike, MD 10/04/23 [phone]

## 2023-10-04 NOTE — ED Notes (Signed)
 Attempted to call report to facility. Unsuccessful

## 2023-10-04 NOTE — Discharge Instructions (Signed)
 You were seen in the Emergency Department (ED) today for a head injury.  Your CT scan did not show any bleeding or broken bones.   Signs of a more serious head injury include vomiting, severe headache, excessive sleepiness or confusion, and weakness or numbness in your face, arms or legs.  Return immediately to the Emergency Department if you experience any of these more concerning symptoms.

## 2023-10-05 DIAGNOSIS — I10 Essential (primary) hypertension: Secondary | ICD-10-CM | POA: Diagnosis not present

## 2023-10-05 DIAGNOSIS — E785 Hyperlipidemia, unspecified: Secondary | ICD-10-CM | POA: Diagnosis not present

## 2023-10-05 DIAGNOSIS — I679 Cerebrovascular disease, unspecified: Secondary | ICD-10-CM | POA: Diagnosis not present

## 2023-10-05 DIAGNOSIS — I471 Supraventricular tachycardia, unspecified: Secondary | ICD-10-CM | POA: Diagnosis not present

## 2023-10-05 DIAGNOSIS — J449 Chronic obstructive pulmonary disease, unspecified: Secondary | ICD-10-CM | POA: Diagnosis not present

## 2023-10-06 ENCOUNTER — Telehealth: Payer: Self-pay

## 2023-10-06 DIAGNOSIS — I10 Essential (primary) hypertension: Secondary | ICD-10-CM | POA: Diagnosis not present

## 2023-10-06 DIAGNOSIS — Z720 Tobacco use: Secondary | ICD-10-CM | POA: Diagnosis not present

## 2023-10-06 DIAGNOSIS — I679 Cerebrovascular disease, unspecified: Secondary | ICD-10-CM | POA: Diagnosis not present

## 2023-10-06 DIAGNOSIS — E785 Hyperlipidemia, unspecified: Secondary | ICD-10-CM | POA: Diagnosis not present

## 2023-10-06 DIAGNOSIS — I77819 Aortic ectasia, unspecified site: Secondary | ICD-10-CM | POA: Diagnosis not present

## 2023-10-06 DIAGNOSIS — J449 Chronic obstructive pulmonary disease, unspecified: Secondary | ICD-10-CM | POA: Diagnosis not present

## 2023-10-06 DIAGNOSIS — I471 Supraventricular tachycardia, unspecified: Secondary | ICD-10-CM | POA: Diagnosis not present

## 2023-10-06 NOTE — Transitions of Care (Post Inpatient/ED Visit) (Unsigned)
   10/06/2023  Name: Ryan Montgomery MRN: [id number] DOB: 1955-11-13  Today's TOC FU Call Status: Today's TOC FU Call Status:: Unsuccessful Call (1st Attempt) Unsuccessful Call (1st Attempt) Date: 10/06/23  Attempted to reach the patient regarding the most recent Inpatient/ED visit.  Follow Up Plan: Additional outreach attempts will be made to reach the patient to complete the Transitions of Care (Post Inpatient/ED visit) call.   Signature Darrall Ellison, LPN [facility] Nurse Health Advisor Direct Dial [phone]

## 2023-10-07 DIAGNOSIS — G9341 Metabolic encephalopathy: Secondary | ICD-10-CM | POA: Diagnosis not present

## 2023-10-07 DIAGNOSIS — J40 Bronchitis, not specified as acute or chronic: Secondary | ICD-10-CM | POA: Diagnosis not present

## 2023-10-07 DIAGNOSIS — I679 Cerebrovascular disease, unspecified: Secondary | ICD-10-CM | POA: Diagnosis not present

## 2023-10-07 DIAGNOSIS — I471 Supraventricular tachycardia, unspecified: Secondary | ICD-10-CM | POA: Diagnosis not present

## 2023-10-07 DIAGNOSIS — J449 Chronic obstructive pulmonary disease, unspecified: Secondary | ICD-10-CM | POA: Diagnosis not present

## 2023-10-07 DIAGNOSIS — E785 Hyperlipidemia, unspecified: Secondary | ICD-10-CM | POA: Diagnosis not present

## 2023-10-07 DIAGNOSIS — I10 Essential (primary) hypertension: Secondary | ICD-10-CM | POA: Diagnosis not present

## 2023-10-07 NOTE — Transitions of Care (Post Inpatient/ED Visit) (Unsigned)
   10/07/2023  Name: Georgios Solla MRN: [id number] DOB: Mar 18, 1956  Today's TOC FU Call Status: Today's TOC FU Call Status:: Unsuccessful Call (2nd Attempt) Unsuccessful Call (1st Attempt) Date: 10/06/23 Unsuccessful Call (2nd Attempt) Date: 10/07/23  Attempted to reach the patient regarding the most recent Inpatient/ED visit.  Follow Up Plan: Additional outreach attempts will be made to reach the patient to complete the Transitions of Care (Post Inpatient/ED visit) call.   Signature Darrall Ellison, LPN [facility] Nurse Health Advisor Direct Dial [phone]

## 2023-10-08 DIAGNOSIS — I77819 Aortic ectasia, unspecified site: Secondary | ICD-10-CM | POA: Diagnosis not present

## 2023-10-08 DIAGNOSIS — I679 Cerebrovascular disease, unspecified: Secondary | ICD-10-CM | POA: Diagnosis not present

## 2023-10-08 DIAGNOSIS — I471 Supraventricular tachycardia, unspecified: Secondary | ICD-10-CM | POA: Diagnosis not present

## 2023-10-08 DIAGNOSIS — Z720 Tobacco use: Secondary | ICD-10-CM | POA: Diagnosis not present

## 2023-10-08 DIAGNOSIS — J449 Chronic obstructive pulmonary disease, unspecified: Secondary | ICD-10-CM | POA: Diagnosis not present

## 2023-10-08 DIAGNOSIS — E785 Hyperlipidemia, unspecified: Secondary | ICD-10-CM | POA: Diagnosis not present

## 2023-10-08 DIAGNOSIS — I10 Essential (primary) hypertension: Secondary | ICD-10-CM | POA: Diagnosis not present

## 2023-10-08 NOTE — Transitions of Care (Post Inpatient/ED Visit) (Signed)
   10/08/2023  Name: Ryan Montgomery MRN: [id number] DOB: 1956-05-17  Today's TOC FU Call Status: Today's TOC FU Call Status:: Unsuccessful Call (3rd Attempt) Unsuccessful Call (1st Attempt) Date: 10/06/23 Unsuccessful Call (2nd Attempt) Date: 10/07/23 Unsuccessful Call (3rd Attempt) Date: 10/08/23  Attempted to reach the patient regarding the most recent Inpatient/ED visit.  Follow Up Plan: No further outreach attempts will be made at this time. We have been unable to contact the patient.  Signature Darrall Ellison, LPN [facility] Nurse Health Advisor Direct Dial [phone]

## 2023-10-09 DIAGNOSIS — I10 Essential (primary) hypertension: Secondary | ICD-10-CM | POA: Diagnosis not present

## 2023-10-09 DIAGNOSIS — E785 Hyperlipidemia, unspecified: Secondary | ICD-10-CM | POA: Diagnosis not present

## 2023-10-09 DIAGNOSIS — I471 Supraventricular tachycardia, unspecified: Secondary | ICD-10-CM | POA: Diagnosis not present

## 2023-10-09 DIAGNOSIS — I679 Cerebrovascular disease, unspecified: Secondary | ICD-10-CM | POA: Diagnosis not present

## 2023-10-09 DIAGNOSIS — Z720 Tobacco use: Secondary | ICD-10-CM | POA: Diagnosis not present

## 2023-10-09 DIAGNOSIS — J449 Chronic obstructive pulmonary disease, unspecified: Secondary | ICD-10-CM | POA: Diagnosis not present

## 2023-10-09 DIAGNOSIS — I77819 Aortic ectasia, unspecified site: Secondary | ICD-10-CM | POA: Diagnosis not present

## 2023-10-11 DIAGNOSIS — I10 Essential (primary) hypertension: Secondary | ICD-10-CM | POA: Diagnosis not present

## 2023-10-11 DIAGNOSIS — J449 Chronic obstructive pulmonary disease, unspecified: Secondary | ICD-10-CM | POA: Diagnosis not present

## 2023-10-11 DIAGNOSIS — E785 Hyperlipidemia, unspecified: Secondary | ICD-10-CM | POA: Diagnosis not present

## 2023-10-11 DIAGNOSIS — Z720 Tobacco use: Secondary | ICD-10-CM | POA: Diagnosis not present

## 2023-10-11 DIAGNOSIS — I471 Supraventricular tachycardia, unspecified: Secondary | ICD-10-CM | POA: Diagnosis not present

## 2023-10-11 DIAGNOSIS — I679 Cerebrovascular disease, unspecified: Secondary | ICD-10-CM | POA: Diagnosis not present

## 2023-10-11 DIAGNOSIS — I77819 Aortic ectasia, unspecified site: Secondary | ICD-10-CM | POA: Diagnosis not present

## 2023-10-12 DIAGNOSIS — I77819 Aortic ectasia, unspecified site: Secondary | ICD-10-CM | POA: Diagnosis not present

## 2023-10-12 DIAGNOSIS — J449 Chronic obstructive pulmonary disease, unspecified: Secondary | ICD-10-CM | POA: Diagnosis not present

## 2023-10-12 DIAGNOSIS — E785 Hyperlipidemia, unspecified: Secondary | ICD-10-CM | POA: Diagnosis not present

## 2023-10-12 DIAGNOSIS — I471 Supraventricular tachycardia, unspecified: Secondary | ICD-10-CM | POA: Diagnosis not present

## 2023-10-12 DIAGNOSIS — I10 Essential (primary) hypertension: Secondary | ICD-10-CM | POA: Diagnosis not present

## 2023-10-12 DIAGNOSIS — I679 Cerebrovascular disease, unspecified: Secondary | ICD-10-CM | POA: Diagnosis not present

## 2023-10-12 DIAGNOSIS — Z720 Tobacco use: Secondary | ICD-10-CM | POA: Diagnosis not present

## 2023-10-13 DIAGNOSIS — G9341 Metabolic encephalopathy: Secondary | ICD-10-CM | POA: Diagnosis not present

## 2023-10-13 DIAGNOSIS — E785 Hyperlipidemia, unspecified: Secondary | ICD-10-CM | POA: Diagnosis not present

## 2023-10-13 DIAGNOSIS — I10 Essential (primary) hypertension: Secondary | ICD-10-CM | POA: Diagnosis not present

## 2023-10-14 ENCOUNTER — Encounter: Payer: Self-pay | Admitting: Family Medicine

## 2023-10-14 ENCOUNTER — Telehealth: Admitting: Family Medicine

## 2023-10-14 DIAGNOSIS — I639 Cerebral infarction, unspecified: Secondary | ICD-10-CM

## 2023-10-14 DIAGNOSIS — Z8673 Personal history of transient ischemic attack (TIA), and cerebral infarction without residual deficits: Secondary | ICD-10-CM

## 2023-10-14 DIAGNOSIS — I77819 Aortic ectasia, unspecified site: Secondary | ICD-10-CM | POA: Diagnosis not present

## 2023-10-14 DIAGNOSIS — F1010 Alcohol abuse, uncomplicated: Secondary | ICD-10-CM | POA: Diagnosis not present

## 2023-10-14 DIAGNOSIS — F4310 Post-traumatic stress disorder, unspecified: Secondary | ICD-10-CM | POA: Insufficient documentation

## 2023-10-14 DIAGNOSIS — F418 Other specified anxiety disorders: Secondary | ICD-10-CM

## 2023-10-14 DIAGNOSIS — E785 Hyperlipidemia, unspecified: Secondary | ICD-10-CM | POA: Diagnosis not present

## 2023-10-14 DIAGNOSIS — Z720 Tobacco use: Secondary | ICD-10-CM | POA: Diagnosis not present

## 2023-10-14 DIAGNOSIS — I10 Essential (primary) hypertension: Secondary | ICD-10-CM | POA: Diagnosis not present

## 2023-10-14 DIAGNOSIS — I471 Supraventricular tachycardia, unspecified: Secondary | ICD-10-CM | POA: Diagnosis not present

## 2023-10-14 DIAGNOSIS — G40909 Epilepsy, unspecified, not intractable, without status epilepticus: Secondary | ICD-10-CM | POA: Diagnosis not present

## 2023-10-14 DIAGNOSIS — I679 Cerebrovascular disease, unspecified: Secondary | ICD-10-CM | POA: Diagnosis not present

## 2023-10-14 DIAGNOSIS — J449 Chronic obstructive pulmonary disease, unspecified: Secondary | ICD-10-CM | POA: Diagnosis not present

## 2023-10-14 NOTE — Progress Notes (Signed)
 Subjective:    Patient ID: Ryan Montgomery, male    DOB: 08/19/1955, 68 y.o.   MRN: [id number]  HPI Virtual Visit via Video Note  I connected with the patient on 10/14/23 at  2:30 PM EDT by a video enabled telemedicine application and verified that I am speaking with the correct person using two identifiers.  Location patient: home Location provider:work or home office Persons participating in the virtual visit: patient, provider  I discussed the limitations of evaluation and management by telemedicine and the availability of in person appointments. The patient expressed understanding and agreed to proceed.   HPI: Here with his wife who is also in touch with his daughter to ask advice about his medical situation and for a transitional care follow up to a hospital stay from 09-26-23 to 10-01-23. He presented to the ED with left arm and left leg weakness as well as slurred speech. He was found to have a right sided occipital stroke. He also had a bronchitis that was treated with Augmentin . He was then transferred to [facility] rehab center, where he is still being treated for rehab. The family feels that Ryan Montgomery is not able to mentally or physically take care of himself because the stroke is complicated by his chronic alcoholism and his debilitating combination of PTSD symptoms with depression and anxiety. They feel he requires placement in a skilled nursing facility rather than in an assisted living facility once he is finished with rehab. They are also trying to get more service related medical coverage from the [place]. He is learning to walk again with assistance, but they say he is not mentally or physically able to care for himself at all.    ROS: See pertinent positives and negatives per HPI.  Past Medical History:  Diagnosis Date   Chickenpox    Heat stroke    Hepatitis C    Stroke [facility])     Past Surgical History:  Procedure Laterality Date   BUBBLE STUDY  06/10/2021   Procedure: BUBBLE  STUDY;  Surgeon: Harrold Lincoln, MD;  Location: [facility] ENDOSCOPY;  Service: Cardiovascular;;   colonoscopy  08-28-08   per Dr. Adan Holms, clear, repeat in 10 yrs    fx rt ankle     pins placed   JOINT REPLACEMENT Left 09/12/2019   LOOP RECORDER INSERTION N/A 06/10/2021   Procedure: LOOP RECORDER INSERTION;  Surgeon: Lei Pump, MD;  Location: [facility] INVASIVE CV LAB;  Service: Cardiovascular;  Laterality: N/A;   TEE WITHOUT CARDIOVERSION N/A 06/10/2021   Procedure: TRANSESOPHAGEAL ECHOCARDIOGRAM (TEE);  Surgeon: Harrold Lincoln, MD;  Location: [facility] ENDOSCOPY;  Service: Cardiovascular;  Laterality: N/A;   TOTAL HIP ARTHROPLASTY Left 09/12/2019   per Dr. Awanda Bogaert at [facility]     Family History  Problem Relation Age of Onset   Hypertension Other    Lung cancer Other      Current Outpatient Medications:    amLODipine  (NORVASC ) 2.5 MG tablet, Take 1 tablet (2.5 mg total) by mouth daily., Disp: , Rfl:    clopidogrel  (PLAVIX ) 75 MG tablet, Take 1 tablet (75 mg total) by mouth daily., Disp: 21 tablet, Rfl: 0   Dextran 70-Hypromellose (ARTIFICIAL TEARS PF OP), Place 1 drop into both eyes 3 (three) times daily as needed (for dryness)., Disp: , Rfl:    fluticasone  furoate-vilanterol (BREO ELLIPTA ) 100-25 MCG/ACT AEPB, Inhale 1 puff into the lungs daily., Disp: , Rfl:    loratadine (CLARITIN) 10 MG tablet, Take 10 mg by  mouth daily as needed for allergies or rhinitis., Disp: , Rfl:    Multiple Vitamin (MULTIVITAMIN WITH MINERALS) TABS tablet, Take 1 tablet by mouth daily., Disp: , Rfl:    TYLENOL  500 MG tablet, Take 500-1,000 mg by mouth every 6 (six) hours as needed (for pain)., Disp: , Rfl:    aspirin  EC 81 MG EC tablet, Take 1 tablet (81 mg total) by mouth daily. Swallow whole. (Patient not taking: Reported on 09/26/2023), Disp: , Rfl:    atorvastatin  (LIPITOR) 20 MG tablet, Take 1 tablet (20 mg total) by mouth daily. (Patient not taking: Reported on 09/26/2023), Disp: , Rfl:    EXAM:  VITALS per patient if applicable:  GENERAL: alert, oriented, appears well and in no acute distress  HEENT: atraumatic, conjunttiva clear, no obvious abnormalities on inspection of external nose and ears  NECK: normal movements of the head and neck  LUNGS: on inspection no signs of respiratory distress, breathing rate appears normal, no obvious gross SOB, gasping or wheezing  CV: no obvious cyanosis  MS: moves all visible extremities without noticeable abnormality  PSYCH/NEURO: pleasant and cooperative, no obvious depression or anxiety, speech and thought processing grossly intact  ASSESSMENT AND PLAN:  Discussed the following assessment and plan:  No diagnosis found.     I discussed the assessment and treatment plan with the patient. The patient was provided an opportunity to ask questions and all were answered. The patient agreed with the plan and demonstrated an understanding of the instructions.   The patient was advised to call back or seek an in-person evaluation if the symptoms worsen or if the condition fails to improve as anticipated.      Review of Systems     Objective:   Physical Exam        Assessment & Plan:  He is recovering from his second stroke, and his coverage to stay at the [facility] facility will be exhausted by next week. Due to the combination of his strokes, his alcoholism, his depression, and his PTSD I agree that he is not able to care for himself at all either mentally or physically. He will need to move into a skilled nursing facility. 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 He will see us  as needed. We spent a total of (35   ) minutes reviewing records and discussing these issues.  Corita Diego, MD

## 2023-10-15 ENCOUNTER — Telehealth: Payer: Self-pay | Admitting: *Deleted

## 2023-10-15 NOTE — Telephone Encounter (Signed)
 Copied from CRM [id number]. Topic: Clinical - Medical Advice >> Oct 14, 2023  2:36 PM Albertha Alosa wrote: Reason for CRM: Patient wife paula called in regarding patient having an appointment today, stated patient is in the hospital from having a stroke and her and daughter would like to speak with Dr.Fry on what needs to be done next, stated they will like a callback at [phone]

## 2023-10-15 NOTE — Telephone Encounter (Signed)
 Pt spouse had a Virtual visit with Dr Alyne Babinski yesterday 10/14/23 regarding this problem. Spoke with pt spouse this morning advised that the letters requested are ready for pick up at the office. States that she will pick up from the office today. Pt letter placed int the front office filing cabinet

## 2023-10-16 DIAGNOSIS — Z720 Tobacco use: Secondary | ICD-10-CM | POA: Diagnosis not present

## 2023-10-16 DIAGNOSIS — I471 Supraventricular tachycardia, unspecified: Secondary | ICD-10-CM | POA: Diagnosis not present

## 2023-10-16 DIAGNOSIS — E785 Hyperlipidemia, unspecified: Secondary | ICD-10-CM | POA: Diagnosis not present

## 2023-10-16 DIAGNOSIS — I679 Cerebrovascular disease, unspecified: Secondary | ICD-10-CM | POA: Diagnosis not present

## 2023-10-16 DIAGNOSIS — I77819 Aortic ectasia, unspecified site: Secondary | ICD-10-CM | POA: Diagnosis not present

## 2023-10-16 DIAGNOSIS — I10 Essential (primary) hypertension: Secondary | ICD-10-CM | POA: Diagnosis not present

## 2023-10-16 DIAGNOSIS — J449 Chronic obstructive pulmonary disease, unspecified: Secondary | ICD-10-CM | POA: Diagnosis not present

## 2023-10-20 DIAGNOSIS — I679 Cerebrovascular disease, unspecified: Secondary | ICD-10-CM | POA: Diagnosis not present

## 2023-10-20 DIAGNOSIS — I10 Essential (primary) hypertension: Secondary | ICD-10-CM | POA: Diagnosis not present

## 2023-10-20 DIAGNOSIS — E785 Hyperlipidemia, unspecified: Secondary | ICD-10-CM | POA: Diagnosis not present

## 2023-10-20 DIAGNOSIS — I471 Supraventricular tachycardia, unspecified: Secondary | ICD-10-CM | POA: Diagnosis not present

## 2023-10-20 DIAGNOSIS — J449 Chronic obstructive pulmonary disease, unspecified: Secondary | ICD-10-CM | POA: Diagnosis not present

## 2023-10-20 NOTE — Progress Notes (Deleted)
 Cardiology Clinic Note   Patient Name: Fielding Mault Date of Encounter: 10/20/2023  Primary Care Provider:  Donley Furth, MD Primary Cardiologist:  None  Patient Profile    Zykeem Bauserman 68 year old male presents the clinic today for follow-up evaluation of his syncope, hypertension, and aortic root dilation.  Past Medical History    Past Medical History:  Diagnosis Date   Chickenpox    Heat stroke    Hepatitis C    Stroke [facility])    Past Surgical History:  Procedure Laterality Date   BUBBLE STUDY  06/10/2021   Procedure: BUBBLE STUDY;  Surgeon: Harrold Lincoln, MD;  Location: [facility] ENDOSCOPY;  Service: Cardiovascular;;   colonoscopy  08-28-08   per Dr. Adan Holms, clear, repeat in 10 yrs    fx rt ankle     pins placed   JOINT REPLACEMENT Left 09/12/2019   LOOP RECORDER INSERTION N/A 06/10/2021   Procedure: LOOP RECORDER INSERTION;  Surgeon: Lei Pump, MD;  Location: [facility] INVASIVE CV LAB;  Service: Cardiovascular;  Laterality: N/A;   TEE WITHOUT CARDIOVERSION N/A 06/10/2021   Procedure: TRANSESOPHAGEAL ECHOCARDIOGRAM (TEE);  Surgeon: Harrold Lincoln, MD;  Location: [facility] ENDOSCOPY;  Service: Cardiovascular;  Laterality: N/A;   TOTAL HIP ARTHROPLASTY Left 09/12/2019   per Dr. 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  He was admitted to the hospital on 09/26/2023 and discharged on 10/01/2023.  He presented with slow and slurred speech.  He was noted to have facial droop.  MRI showed patchy subacute right MCA infarct.  Cardiology was consulted due to recurrent episodes of AMS and bradycardia on ILR.  He was seen and evaluated by Dr. Lavonne Prairie.  It was felt that he would need repeat chest CT for aortic root dilation screening in 1 year.  Due to his cryptogenic stroke  office device interrogation was recommended.  He was continued on his amlodipine  for his hypertension.  His bradycardia was felt to be related to previous vagal episodes.  He denied recent syncope.  It was felt that his altered mental status was related to EtOH.  There was no indication for pacing.  He presents to the clinic today for follow-up evaluation states***.  Essential hypertension-BP today***. Maintain blood pressure log Heart healthy low-sodium diet Continue amlodipine   Syncope-noted to have bradycardia which was felt to be related to vasovagal.  Denies episodes of lightheadedness, dizziness, presyncope or syncope.  Denies EtOH use. ILR monitored by EP  Cryptogenic stroke-following with EP for regular device interrogations. Avoid triggers caffeine, chocolate, EtOH, dehydration etc.  Aortic root dilation-denies recent episodes of chest and back discomfort.  TEE 2023 showed 44 mm aortic root dilation. Maintain good blood pressure control Plan for repeat chest CT aorta 2023  Disposition: Follow-up with Dr. Lavonne Prairie or me in 9-12 months.  Home Medications    Prior to Admission medications   Medication Sig Start Date End Date Taking? Authorizing Provider  amLODipine  (NORVASC ) 2.5 MG tablet Take 1 tablet (2.5 mg total) by mouth daily. 10/02/23   Abbe Abate, MD  aspirin  EC 81 MG EC tablet Take 1 tablet (81 mg total) by mouth daily. Swallow whole. Patient not taking: Reported on 09/26/2023 06/14/21   Wynetta Heckle, MD  atorvastatin  (LIPITOR) 20 MG tablet  Take 1 tablet (20 mg total) by mouth daily. Patient not taking: Reported on 09/26/2023 06/14/21   Wynetta Heckle, MD  clopidogrel  (PLAVIX ) 75 MG tablet Take 1 tablet (75 mg total) by mouth daily. 10/02/23   Abbe Abate, MD  Dextran 70-Hypromellose (ARTIFICIAL TEARS PF OP) Place 1 drop into both eyes 3 (three) times daily as needed (for dryness).    [provider]  fluticasone  furoate-vilanterol (BREO ELLIPTA ) 100-25  MCG/ACT AEPB Inhale 1 puff into the lungs daily. 10/02/23   Abbe Abate, MD  loratadine (CLARITIN) 10 MG tablet Take 10 mg by mouth daily as needed for allergies or rhinitis.    [provider]  Multiple Vitamin (MULTIVITAMIN WITH MINERALS) TABS tablet Take 1 tablet by mouth daily. 10/02/23   Abbe Abate, MD  TYLENOL  500 MG tablet Take 500-1,000 mg by mouth every 6 (six) hours as needed (for pain).    [provider]    Family History    Family History  Problem Relation Age of Onset   Hypertension Other    Lung cancer Other    He indicated that his mother is deceased. He indicated that his father is deceased.  Social History    Social History   Socioeconomic History   Marital status: Married    Spouse name: Not on file   Number of children: Not on file   Years of education: Not on file   Highest education level: Not on file  Occupational History   Not on file  Tobacco Use   Smoking status: Every Day    Current packs/day: 0.50    Types: Cigarettes   Smokeless tobacco: Never  Vaping Use   Vaping status: Never Used  Substance and Sexual Activity   Alcohol use: Yes   Drug use: No   Sexual activity: Not on file  Other Topics Concern   Not on file  Social History Narrative   Right handed   Lives with wife one story home   Social Drivers of Health   Financial Resource Strain: Not on file  Food Insecurity: No Food Insecurity (09/27/2023)   Hunger Vital Sign    Worried About Running Out of Food in the Last Year: Never true    Ran Out of Food in the Last Year: Never true  Transportation Needs: No Transportation Needs (09/27/2023)   PRAPARE - Administrator, Civil Service (Medical): No    Lack of Transportation (Non-Medical): No  Physical Activity: Not on file  Stress: Not on file  Social Connections: Unknown (09/27/2023)   Social Connection and Isolation Panel [NHANES]    Frequency of Communication with Friends and Family: More than  three times a week    Frequency of Social Gatherings with Friends and Family: More than three times a week    Attends Religious Services: Not on file    Active Member of Clubs or Organizations: Not on file    Attends Banker Meetings: Not on file    Marital Status: Not on file  Intimate Partner Violence: Unknown (09/27/2023)   Humiliation, Afraid, Rape, and Kick questionnaire    Fear of Current or Ex-Partner: No    Emotionally Abused: No    Physically Abused: Not on file    Sexually Abused: No     Review of Systems    General:  No chills, fever, night sweats or weight changes.  Cardiovascular:  No chest pain, dyspnea on exertion, edema, orthopnea, palpitations, paroxysmal  nocturnal dyspnea. Dermatological: No rash, lesions/masses Respiratory: No cough, dyspnea Urologic: No hematuria, dysuria Abdominal:   No nausea, vomiting, diarrhea, bright red blood per rectum, melena, or hematemesis Neurologic:  No visual changes, wkns, changes in mental status. All other systems reviewed and are otherwise negative except as noted above.  Physical Exam    VS:  There were no vitals taken for this visit. , BMI There is no height or weight on file to calculate BMI. GEN: Well nourished, well developed, in no acute distress. HEENT: normal. Neck: Supple, no JVD, carotid bruits, or masses. Cardiac: RRR, no murmurs, rubs, or gallops. No clubbing, cyanosis, edema.  Radials/DP/PT 2+ and equal bilaterally.  Respiratory:  Respirations regular and unlabored, clear to auscultation bilaterally. GI: Soft, nontender, nondistended, BS + x 4. MS: no deformity or atrophy. Skin: warm and dry, no rash. Neuro:  Strength and sensation are intact. Psych: Normal affect.  Accessory Clinical Findings    Recent Labs: 09/27/2023: ALT 11 09/30/2023: Hemoglobin 14.0; Magnesium  1.8; Platelets 152 10/01/2023: BUN 12; Creatinine, Ser 1.49; Potassium 3.5; Sodium 139   Recent Lipid Panel    Component Value  Date/Time   CHOL 164 09/27/2023 0708   TRIG 163 (H) 09/27/2023 0708   HDL 27 (L) 09/27/2023 0708   CHOLHDL 6.1 09/27/2023 0708   VLDL 33 09/27/2023 0708   LDLCALC 104 (H) 09/27/2023 0708   LDLDIRECT 23.7 07/08/2011 0956    No BP recorded.  {Refresh Note OR Click here to enter BP  :1}***    ECG personally reviewed by me today- ***     Echocardiogram 06/10/2021   Sonographer:     Crissie Dome RDCS (AE)  Referring Phys:  1993 RHONDA G BARRETT  Diagnosing Phys: Jackquelyn Mass MD   PROCEDURE: After discussion of the risks and benefits of a TEE, an  informed consent was obtained from the patient. TEE procedure time was 16  minutes. The transesophogeal probe was passed without difficulty through  the esophogus of the patient. Imaged  were obtained with the patient in a left lateral decubitus position. Local  oropharyngeal anesthetic was provided with Cetacaine. Sedation performed  by different physician. The patient was monitored while under deep  sedation. Anesthestetic sedation was  provided intravenously by Anesthesiology: 175mg  of Propofol . Image quality  was excellent. The patient's vital signs; including heart rate, blood  pressure, and oxygen saturation; remained stable throughout the procedure.  The patient developed no  complications during the procedure.   IMPRESSIONS     1. Aortic dilatation noted. Aneurysm of the aortic root, measuring 45 mm.  There is Moderate (Grade III) layered plaque involving the descending  aorta and aortic arch.   2. Equivocal bubble study with trivial bubbles crossing the IAS. Findings  possibly represent a trivial PFO. Would defer to transcranial dopplers for  interatrial shunt. Agitated saline contrast bubble study was positive with  shunting observed within 3-6  cardiac cycles suggestive of interatrial shunt. There is a small patent  foramen ovale with predominantly right to left shunting across the atrial  septum.   3. No left  atrial/left atrial appendage thrombus was detected. The LAA  emptying velocity was 75 cm/s.   4. Left ventricular ejection fraction, by estimation, is 60 to 65%. The  left ventricle has normal function.   5. Right ventricular systolic function is normal. The right ventricular  size is normal.   6. The mitral valve is grossly normal. Trivial mitral valve  regurgitation. No evidence of mitral stenosis.  7. The aortic valve is tricuspid. Aortic valve regurgitation is mild. No  aortic stenosis is present.   FINDINGS   Left Ventricle: Left ventricular ejection fraction, by estimation, is 60  to 65%. The left ventricle has normal function. The left ventricular  internal cavity size was normal in size.   Right Ventricle: The right ventricular size is normal. No increase in  right ventricular wall thickness. Right ventricular systolic function is  normal.   Left Atrium: Left atrial size was normal in size. No left atrial/left  atrial appendage thrombus was detected. The LAA emptying velocity was 75  cm/s.   Right Atrium: Right atrial size was normal in size.   Pericardium: Trivial pericardial effusion is present.   Mitral Valve: The mitral valve is grossly normal. Trivial mitral valve  regurgitation. No evidence of mitral valve stenosis.   Tricuspid Valve: The tricuspid valve is grossly normal. Tricuspid valve  regurgitation is trivial. No evidence of tricuspid stenosis.   Aortic Valve: The aortic valve is tricuspid. Aortic valve regurgitation is  mild. No aortic stenosis is present.   Pulmonic Valve: The pulmonic valve was grossly normal. Pulmonic valve  regurgitation is trivial. No evidence of pulmonic stenosis.   Aorta: Aortic dilatation noted. There is an aneurysm involving the aortic  root measuring 45 mm. There is moderate (Grade III) layered plaque  involving the descending aorta and aortic arch.   Venous: The left lower pulmonary vein, left upper pulmonary vein, right   upper pulmonary vein and right lower pulmonary vein are normal.   IAS/Shunts: There is redundancy of the interatrial septum. The atrial  septum is grossly normal. Agitated saline contrast was given intravenously  to evaluate for intracardiac shunting. Agitated saline contrast bubble  study was positive with shunting  observed within 3-6 cardiac cycles suggestive of interatrial shunt. A  small patent foramen ovale is detected with predominantly right to left  shunting across the atrial septum. Equivocal bubble study with trivial  bubbles crossing the IAS. Findings  possibly represent a trivial PFO. Would defer to transcranial dopplers for  interatrial shunt.           Assessment & Plan   1.  ***   Chet Cota. Kamarrion Stfort NP-C     10/20/2023, 7:14 AM [facility] [address] Office [phone] Fax [phone]    I spent***minutes examining this patient, reviewing medications, and using patient centered shared decision making involving their cardiac care.   I spent  20 minutes reviewing past medical history,  medications, and prior cardiac tests.

## 2023-10-21 ENCOUNTER — Ambulatory Visit: Attending: General Practice | Admitting: General Practice

## 2023-10-25 DIAGNOSIS — I77819 Aortic ectasia, unspecified site: Secondary | ICD-10-CM | POA: Diagnosis not present

## 2023-10-25 DIAGNOSIS — I679 Cerebrovascular disease, unspecified: Secondary | ICD-10-CM | POA: Diagnosis not present

## 2023-10-25 DIAGNOSIS — I10 Essential (primary) hypertension: Secondary | ICD-10-CM | POA: Diagnosis not present

## 2023-10-25 DIAGNOSIS — J449 Chronic obstructive pulmonary disease, unspecified: Secondary | ICD-10-CM | POA: Diagnosis not present

## 2023-10-25 DIAGNOSIS — Z720 Tobacco use: Secondary | ICD-10-CM | POA: Diagnosis not present

## 2023-10-25 DIAGNOSIS — E785 Hyperlipidemia, unspecified: Secondary | ICD-10-CM | POA: Diagnosis not present

## 2023-10-25 DIAGNOSIS — I471 Supraventricular tachycardia, unspecified: Secondary | ICD-10-CM | POA: Diagnosis not present

## 2023-10-26 DIAGNOSIS — I10 Essential (primary) hypertension: Secondary | ICD-10-CM | POA: Diagnosis not present

## 2023-10-26 DIAGNOSIS — J449 Chronic obstructive pulmonary disease, unspecified: Secondary | ICD-10-CM | POA: Diagnosis not present

## 2023-10-26 DIAGNOSIS — E785 Hyperlipidemia, unspecified: Secondary | ICD-10-CM | POA: Diagnosis not present

## 2023-10-26 DIAGNOSIS — Z720 Tobacco use: Secondary | ICD-10-CM | POA: Diagnosis not present

## 2023-10-26 DIAGNOSIS — I471 Supraventricular tachycardia, unspecified: Secondary | ICD-10-CM | POA: Diagnosis not present

## 2023-10-26 DIAGNOSIS — I77819 Aortic ectasia, unspecified site: Secondary | ICD-10-CM | POA: Diagnosis not present

## 2023-10-26 DIAGNOSIS — I679 Cerebrovascular disease, unspecified: Secondary | ICD-10-CM | POA: Diagnosis not present

## 2023-11-09 NOTE — Progress Notes (Deleted)
  Electrophysiology Office Note:   Date:  11/09/2023  ID:  Ryan Montgomery, DOB January 08, 1956, MRN [id number]  Primary Cardiologist: None Primary Heart Failure: None Electrophysiologist: None  {Click to update primary MD,subspecialty MD or APP then REFRESH:1}    History of Present Illness:   Ryan Montgomery is a 68 y.o. male with h/o AF with pauses, cryptogenic CVA, HTN, ETOH abuse, Hepatitis C+ seen today for routine electrophysiology followup.   Recent admit 5/3 - 09/28/23 with slurred speech and facial droop. CT code stroke wedge-shaped area of edema in the right occipital region. MRI brain showed patchy subacute right posterior MCA infarct. Hypercoagulable work up negative. Imaging negative for occult malignancy.  He reported sobriety but blood alcohol level was 167 on admit.   Since last being seen in our clinic the patient reports doing ***.    He denies chest pain, palpitations, dyspnea, PND, orthopnea, nausea, vomiting, dizziness, syncope, edema, weight gain, or early satiety.   Review of systems complete and found to be negative unless listed in HPI.   EP Information / Studies Reviewed:    EKG is not ordered today. EKG from 09/26/23 reviewed which showed NSR 81 bpm       Studies:  ECHO w/Bubble 09/27/23 > LVEF 60-65%, G1DD, bubble study negative, LA/RA normal size   DEVICE HISTORY:  MDT ILR, implanted 06/10/2021 > pt is not monitored monthly due to cost  ILR interrogation 09/28/23 > personal review showsno AF for the lifetime of the device, <1% PVC's, presenting rhythm NSR at 94 bpm, only 6 episodes total > most recent pause episode on 04/23/23 lasting 7 seconds with vasovagal features to slowing (reviewed with Dr. Daneil Dunker) - two other pause episodes lasting 5 sec / 6 sec's., episode of SVT on 02/16/23 longest lasting 12 min 31 seconds with max rate of 240 bpm. No recent arrhythmias.    Risk Assessment/Calculations:    CHA2DS2-VASc Score =    {Click here to calculate score.  REFRESH note  before signing. :1} This indicates a  % annual risk of stroke. The patient's score is based upon:      No BP recorded.  {Refresh Note OR Click here to enter BP  :1}***        Physical Exam:   VS:  There were no vitals taken for this visit.   Wt Readings from Last 3 Encounters:  10/04/23 220 lb (99.8 kg)  09/26/23 220 lb (99.8 kg)  08/28/23 200 lb (90.7 kg)     GEN: Well nourished, well developed in no acute distress NECK: No JVD; No carotid bruits CARDIAC: {EPRHYTHM:28826}, no murmurs, rubs, gallops RESPIRATORY:  Clear to auscultation without rales, wheezing or rhonchi  ABDOMEN: Soft, non-tender, non-distended EXTREMITIES:  No edema; No deformity   ASSESSMENT AND PLAN:    Cryptogenic Stroke  Vasovagal Pauses  SVT  ETOH Abuse S/p ILR  -ILR review shows ***, no AF *** -vasovagal pauses may be related to ETOH / blacking out *** -is patient connected to device clinic? Previously was not ***  Follow up with Dr. Daneil Dunker {EPFOLLOW OZ:30865}  Signed, Creighton Doffing, NP-C, AGACNP-BC [facility] - Electrophysiology  11/09/2023, 9:23 PM

## 2023-11-10 ENCOUNTER — Ambulatory Visit: Attending: Pulmonary Disease | Admitting: Pulmonary Disease

## 2023-11-10 DIAGNOSIS — I471 Supraventricular tachycardia, unspecified: Secondary | ICD-10-CM

## 2023-11-10 DIAGNOSIS — F1010 Alcohol abuse, uncomplicated: Secondary | ICD-10-CM

## 2023-11-10 DIAGNOSIS — I639 Cerebral infarction, unspecified: Secondary | ICD-10-CM

## 2023-11-11 ENCOUNTER — Encounter: Payer: Self-pay | Admitting: Pulmonary Disease

## 2023-11-11 DIAGNOSIS — E785 Hyperlipidemia, unspecified: Secondary | ICD-10-CM | POA: Diagnosis not present

## 2023-11-11 DIAGNOSIS — Z720 Tobacco use: Secondary | ICD-10-CM | POA: Diagnosis not present

## 2023-11-11 DIAGNOSIS — J449 Chronic obstructive pulmonary disease, unspecified: Secondary | ICD-10-CM | POA: Diagnosis not present

## 2023-11-11 DIAGNOSIS — I679 Cerebrovascular disease, unspecified: Secondary | ICD-10-CM | POA: Diagnosis not present

## 2023-11-11 DIAGNOSIS — I10 Essential (primary) hypertension: Secondary | ICD-10-CM | POA: Diagnosis not present

## 2023-11-11 DIAGNOSIS — I471 Supraventricular tachycardia, unspecified: Secondary | ICD-10-CM | POA: Diagnosis not present

## 2023-11-11 DIAGNOSIS — I77819 Aortic ectasia, unspecified site: Secondary | ICD-10-CM | POA: Diagnosis not present

## 2023-11-25 DIAGNOSIS — G9341 Metabolic encephalopathy: Secondary | ICD-10-CM | POA: Diagnosis not present

## 2023-12-09 DIAGNOSIS — Z720 Tobacco use: Secondary | ICD-10-CM | POA: Diagnosis not present

## 2023-12-09 DIAGNOSIS — I679 Cerebrovascular disease, unspecified: Secondary | ICD-10-CM | POA: Diagnosis not present

## 2023-12-09 DIAGNOSIS — I77819 Aortic ectasia, unspecified site: Secondary | ICD-10-CM | POA: Diagnosis not present

## 2023-12-09 DIAGNOSIS — I10 Essential (primary) hypertension: Secondary | ICD-10-CM | POA: Diagnosis not present

## 2023-12-09 DIAGNOSIS — E785 Hyperlipidemia, unspecified: Secondary | ICD-10-CM | POA: Diagnosis not present

## 2023-12-09 DIAGNOSIS — J449 Chronic obstructive pulmonary disease, unspecified: Secondary | ICD-10-CM | POA: Diagnosis not present

## 2023-12-09 DIAGNOSIS — I471 Supraventricular tachycardia, unspecified: Secondary | ICD-10-CM | POA: Diagnosis not present

## 2023-12-10 DIAGNOSIS — I10 Essential (primary) hypertension: Secondary | ICD-10-CM | POA: Diagnosis not present

## 2023-12-10 DIAGNOSIS — I679 Cerebrovascular disease, unspecified: Secondary | ICD-10-CM | POA: Diagnosis not present

## 2023-12-10 DIAGNOSIS — E785 Hyperlipidemia, unspecified: Secondary | ICD-10-CM | POA: Diagnosis not present

## 2023-12-10 DIAGNOSIS — I471 Supraventricular tachycardia, unspecified: Secondary | ICD-10-CM | POA: Diagnosis not present

## 2023-12-10 DIAGNOSIS — Z720 Tobacco use: Secondary | ICD-10-CM | POA: Diagnosis not present

## 2023-12-10 DIAGNOSIS — I77819 Aortic ectasia, unspecified site: Secondary | ICD-10-CM | POA: Diagnosis not present

## 2023-12-10 DIAGNOSIS — J449 Chronic obstructive pulmonary disease, unspecified: Secondary | ICD-10-CM | POA: Diagnosis not present

## 2023-12-12 DIAGNOSIS — R278 Other lack of coordination: Secondary | ICD-10-CM | POA: Diagnosis not present

## 2023-12-12 DIAGNOSIS — G9341 Metabolic encephalopathy: Secondary | ICD-10-CM | POA: Diagnosis not present

## 2023-12-12 DIAGNOSIS — R262 Difficulty in walking, not elsewhere classified: Secondary | ICD-10-CM | POA: Diagnosis not present

## 2023-12-12 DIAGNOSIS — R296 Repeated falls: Secondary | ICD-10-CM | POA: Diagnosis not present

## 2023-12-12 DIAGNOSIS — I679 Cerebrovascular disease, unspecified: Secondary | ICD-10-CM | POA: Diagnosis not present

## 2023-12-12 DIAGNOSIS — N179 Acute kidney failure, unspecified: Secondary | ICD-10-CM | POA: Diagnosis not present

## 2023-12-12 DIAGNOSIS — J441 Chronic obstructive pulmonary disease with (acute) exacerbation: Secondary | ICD-10-CM | POA: Diagnosis not present

## 2023-12-12 DIAGNOSIS — J449 Chronic obstructive pulmonary disease, unspecified: Secondary | ICD-10-CM | POA: Diagnosis not present

## 2023-12-12 DIAGNOSIS — M6281 Muscle weakness (generalized): Secondary | ICD-10-CM | POA: Diagnosis not present

## 2024-01-01 ENCOUNTER — Telehealth: Payer: Self-pay | Admitting: Family Medicine

## 2024-01-01 NOTE — Telephone Encounter (Signed)
The letter was written

## 2024-03-01 ENCOUNTER — Emergency Department (HOSPITAL_COMMUNITY)
Admission: EM | Admit: 2024-03-01 | Discharge: 2024-03-01 | Disposition: A | Discharge status: Skilled Nursing Facility | Source: Skilled Nursing Facility | Attending: Emergency Medicine | Admitting: Emergency Medicine

## 2024-03-01 ENCOUNTER — Emergency Department (HOSPITAL_COMMUNITY)

## 2024-03-01 ENCOUNTER — Other Ambulatory Visit: Payer: Self-pay

## 2024-03-01 ENCOUNTER — Encounter (HOSPITAL_COMMUNITY): Payer: Self-pay

## 2024-03-01 DIAGNOSIS — Z7982 Long term (current) use of aspirin: Secondary | ICD-10-CM | POA: Insufficient documentation

## 2024-03-01 DIAGNOSIS — Z79899 Other long term (current) drug therapy: Secondary | ICD-10-CM | POA: Diagnosis not present

## 2024-03-01 DIAGNOSIS — F17210 Nicotine dependence, cigarettes, uncomplicated: Secondary | ICD-10-CM | POA: Diagnosis not present

## 2024-03-01 DIAGNOSIS — R42 Dizziness and giddiness: Secondary | ICD-10-CM | POA: Diagnosis present

## 2024-03-01 DIAGNOSIS — Z7902 Long term (current) use of antithrombotics/antiplatelets: Secondary | ICD-10-CM | POA: Diagnosis not present

## 2024-03-01 DIAGNOSIS — I10 Essential (primary) hypertension: Secondary | ICD-10-CM | POA: Insufficient documentation

## 2024-03-01 LAB — COMPREHENSIVE METABOLIC PANEL WITH GFR
ALT: 19 U/L (ref 0–44)
AST: 32 U/L (ref 15–41)
Albumin: 4.3 g/dL (ref 3.5–5.0)
Alkaline Phosphatase: 90 U/L (ref 38–126)
Anion gap: 13 (ref 5–15)
BUN: 16 mg/dL (ref 8–23)
CO2: 22 mmol/L (ref 22–32)
Calcium: 9.6 mg/dL (ref 8.9–10.3)
Chloride: 106 mmol/L (ref 98–111)
Creatinine, Ser: 1.37 mg/dL — ABNORMAL HIGH (ref 0.61–1.24)
GFR, Estimated: 57 mL/min — ABNORMAL LOW (ref >60)
Glucose, Bld: 96 mg/dL (ref 70–99)
Potassium: 4.4 mmol/L (ref 3.5–5.1)
Sodium: 140 mmol/L (ref 135–145)
Total Bilirubin: 0.5 mg/dL (ref 0.0–1.2)
Total Protein: 8.2 g/dL — ABNORMAL HIGH (ref 6.5–8.1)

## 2024-03-01 LAB — CBC
HCT: 45.1 % (ref 39.0–52.0)
Hemoglobin: 14.2 g/dL (ref 13.0–17.0)
MCH: 30.6 pg (ref 26.0–34.0)
MCHC: 31.5 g/dL (ref 30.0–36.0)
MCV: 97.2 fL (ref 80.0–100.0)
Platelets: 178 K/uL (ref 150–400)
RBC: 4.64 MIL/uL (ref 4.22–5.81)
RDW: 13 % (ref 11.5–15.5)
WBC: 8.8 K/uL (ref 4.0–10.5)
nRBC: 0 % (ref 0.0–0.2)

## 2024-03-01 LAB — URINALYSIS, ROUTINE W REFLEX MICROSCOPIC
Bilirubin Urine: NEGATIVE
Glucose, UA: NEGATIVE mg/dL
Hgb urine dipstick: NEGATIVE
Ketones, ur: NEGATIVE mg/dL
Leukocytes,Ua: NEGATIVE
Nitrite: NEGATIVE
Protein, ur: NEGATIVE mg/dL
Specific Gravity, Urine: 1.024 (ref 1.005–1.030)
pH: 5 (ref 5.0–8.0)

## 2024-03-01 LAB — CBG MONITORING, ED: Glucose-Capillary: 115 mg/dL — ABNORMAL HIGH (ref 70–99)

## 2024-03-01 NOTE — ED Triage Notes (Signed)
 Pt BIB [facility] from [facility] c/o an episode of dizziness. EMS informed by staff that pt had one 1 spell of sudden dizziness while standing. As NT was near by and was able to assist the pt to the ground. No injury from event. Pt denies CP/SOB during event. No further s/s - denies dizziness on arrival to ED.   EMS VS  BP 118/80 HR 85 SpO2 100 RA

## 2024-03-01 NOTE — Discharge Instructions (Signed)
 Your workup this evening was reassuring.  Please continue to work at rehab and follow-up with your primary care provider as needed.  If you develop any life-threatening symptoms return to the emergency department.

## 2024-03-01 NOTE — ED Provider Notes (Signed)
 [place] EMERGENCY DEPARTMENT AT [facility] Provider Note   CSN: [id number] Arrival date & time: 03/01/24  [id number]     Patient presents with: Dizziness   Ryan Montgomery is a 68 y.o. male.  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  Patient states he went outside to smoke a cigarette.  He states he ambulates with a walker when he walked back inside was going down the hallway.  He stopped to talk to nursing staff and while standing there began to feel lightheaded.  Nursing staff assisted him to the ground.  He denies dizziness and states he felt lightheaded like he may pass out.  He states this has been happening intermittently since he had a stroke.  He does not feel this is abnormal for him.  Denies any injury, chest pain, shortness of breath, abdominal pain, nausea, vomiting.  Currently denies lightheadedness/dizziness.    Dizziness      Prior to Admission medications   Medication Sig Start Date End Date Taking? Authorizing Provider  amLODipine  (NORVASC ) 2.5 MG tablet Take 1 tablet (2.5 mg total) by mouth daily. 10/02/23   Danton Reyes DASEN, MD  aspirin  EC 81 MG EC tablet Take 1 tablet (81 mg total) by mouth daily. Swallow whole. Patient not taking: Reported on 09/26/2023 06/14/21   Bryn Bernardino NOVAK, MD  atorvastatin  (LIPITOR) 20 MG tablet Take 1 tablet (20 mg total) by mouth daily. Patient not taking: Reported on 09/26/2023 06/14/21   Bryn Bernardino NOVAK, MD  clopidogrel  (PLAVIX ) 75 MG tablet Take 1 tablet (75 mg total) by mouth daily. 10/02/23   Danton Reyes DASEN, MD  Dextran 70-Hypromellose (ARTIFICIAL TEARS PF OP) Place 1 drop into both eyes 3 (three) times daily as needed (for dryness).    [provider]  fluticasone  furoate-vilanterol (BREO ELLIPTA ) 100-25 MCG/ACT AEPB Inhale 1  puff into the lungs daily. 10/02/23   Danton Reyes DASEN, MD  loratadine (CLARITIN) 10 MG tablet Take 10 mg by mouth daily as needed for allergies or rhinitis.    [provider]  Multiple Vitamin (MULTIVITAMIN WITH MINERALS) TABS tablet Take 1 tablet by mouth daily. 10/02/23   Danton Reyes DASEN, MD  TYLENOL  500 MG tablet Take 500-1,000 mg by mouth every 6 (six) hours as needed (for pain).    [provider]    Allergies: Moxifloxacin    Review of Systems  Neurological:  Positive for dizziness.    Updated Vital Signs BP 116/74   Pulse 63   Temp 98.5 F (36.9 C)   Resp 18   SpO2 96%   Physical Exam Vitals and nursing note reviewed.  Constitutional:      General: He is not in acute distress.    Appearance: Normal appearance. He is well-developed. He is not toxic-appearing.  HENT:     Head: Normocephalic and atraumatic.  Eyes:     General: Lids are normal.     Conjunctiva/sclera: Conjunctivae normal.     Pupils: Pupils are equal, round, and reactive to light.  Neck:     Thyroid: No thyroid mass.     Trachea: No tracheal deviation.  Cardiovascular:     Rate and Rhythm: Normal rate and regular rhythm.     Heart sounds: Normal heart sounds.  Pulmonary:     Effort: Pulmonary effort is  normal. No respiratory distress.     Breath sounds: Normal breath sounds. No stridor. No decreased breath sounds, wheezing, rhonchi or rales.  Abdominal:     General: There is no distension.     Palpations: Abdomen is soft.     Tenderness: There is no abdominal tenderness.  Musculoskeletal:        General: No tenderness. Normal range of motion.     Cervical back: Normal range of motion and neck supple.  Skin:    General: Skin is warm and dry.     Findings: No abrasion or rash.  Neurological:     General: No focal deficit present.     Mental Status: He is alert and oriented to person, place, and time. Mental status is at baseline.     GCS: GCS eye subscore is 4. GCS verbal  subscore is 5. GCS motor subscore is 6.     Cranial Nerves: No cranial nerve deficit.     Sensory: No sensory deficit.     Motor: Motor function is intact.     Comments: Speech is normal.  Psychiatric:        Attention and Perception: Attention normal.        Speech: Speech normal.        Behavior: Behavior normal.     (all labs ordered are listed, but only abnormal results are displayed) Labs Reviewed  COMPREHENSIVE METABOLIC PANEL WITH GFR - Abnormal; Notable for the following components:      Result Value   Creatinine, Ser 1.37 (*)    Total Protein 8.2 (*)    GFR, Estimated 57 (*)    All other components within normal limits  CBG MONITORING, ED - Abnormal; Notable for the following components:   Glucose-Capillary 115 (*)    All other components within normal limits  CBC  URINALYSIS, ROUTINE W REFLEX MICROSCOPIC    EKG: EKG Interpretation Date/Time:  Tuesday March 01 2024 03:11:30 EDT Ventricular Rate:  76 PR Interval:  190 QRS Duration:  88 QT Interval:  387 QTC Calculation: 436 R Axis:   30  Text Interpretation: Sinus rhythm Confirmed by Trine Likes [phone]) on 03/01/2024 5:22:43 AM  Radiology: CT Head Wo Contrast Result Date: 03/01/2024 EXAM: CT HEAD WITHOUT CONTRAST 03/01/2024 04:44:07 AM TECHNIQUE: CT of the head was performed without the administration of intravenous contrast. Automated exposure control, iterative reconstruction, and/or weight based adjustment of the mA/kV was utilized to reduce the radiation dose to as low as reasonably achievable. COMPARISON: Brain MRI 09/26/23 and head CT 10/04/23. CLINICAL HISTORY: 68 year old male with syncope/presyncope, cerebrovascular cause suspected, history of CVA. FINDINGS: BRAIN AND VENTRICLES: No acute hemorrhage. No evidence of acute infarct. No hydrocephalus. No extra-axial collection. No mass effect or midline shift. Expected evolution of right hemisphere cortically based infarcts since May, with encephalomalacia.  Additional multifocal bilateral chronic encephalomalacia from prior infarcts. Stable cerebral volume. Stable gray-white differentiation otherwise, including multifocal chronic lacunar infarcts in the deep white matter capsules, right pons, and bilateral cerebellum. Stable basal ganglia vascular calcifications. No suspicious intracranial vascular hyperdensity. ORBITS: No acute abnormality. SINUSES: Resolved bilaterally maxillary sinus fluid levels and opacification since May. Tympanic cavities and mastoids appear clear. SOFT TISSUES AND SKULL: No acute soft tissue abnormality. No skull fracture. IMPRESSION: 1. Advanced chronic ischemic disease. Expected evolution of right hemisphere infarcts since May . 2. No acute intracranial abnormality. Electronically signed by: Helayne Hurst MD 03/01/2024 04:56 AM EDT RP Workstation: [id number]     Procedures  Medications Ordered in the ED - No data to display                                  Medical Decision Making Amount and/or Complexity of Data Reviewed Labs: ordered. Radiology: ordered.   This patient presents to the ED for concern of lightheadedness/presyncope, this involves an extensive number of treatment options, and is a complaint that carries with it a high risk of complications and morbidity.  The differential diagnosis includes intracranial abnormality, vasovagal response, dehydration/orthostatic changes, deconditioning, symptomatic anemia, dysrhythmia, others   Co morbidities / Chronic conditions that complicate the patient evaluation  History CVA   Additional history obtained:  Additional history obtained from EMR External records from outside source obtained and reviewed including previous discharge summary   Lab Tests:  I Ordered, and personally interpreted labs.  The pertinent results include: Creatinine 1.37 consistent with baseline, unremarkable UA, unremarkable CBC   Imaging Studies ordered:  I ordered imaging studies  including CT head without contrast I independently visualized and interpreted imaging which showed  1. Advanced chronic ischemic disease. Expected evolution of right hemisphere  infarcts since May .  2. No acute intracranial abnormality.   I agree with the radiologist interpretation   Cardiac Monitoring: / EKG:  The patient was maintained on a cardiac monitor.  I personally viewed and interpreted the cardiac monitored which showed an underlying rhythm of: Sinus rhythm    Social Determinants of Health:  Patient is a daily smoker   Test / Admission - Considered:  Patient with no significant orthostatic changes.  No dysrhythmia on EKG.  No acute abnormality on CT scan.  Patient was not lightheaded or dizzy during the encounter here in the emergency department.  He states his symptoms subsided prior to EMS arriving at the facility.  At this time I see no indication for further emergent workup or admission.  The patient may return to rehab for further treatment as needed.      Final diagnoses:  Lightheadedness    ED Discharge Orders     None          Logan Ubaldo KATHEE DEVONNA 03/01/24 [id number]    Trine Raynell Moder, MD 03/01/24 [phone]

## 2024-03-01 NOTE — ED Notes (Signed)
 Called [facility] for transport back to SNF
